# Patient Record
Sex: Male | Born: 1945
Health system: Southern US, Community
[De-identification: ages and names within clinical notes are randomized; demographics above are authoritative.]

## PROBLEM LIST (undated history)

## (undated) DIAGNOSIS — Z973 Presence of spectacles and contact lenses: Secondary | ICD-10-CM

## (undated) DIAGNOSIS — R569 Unspecified convulsions: Secondary | ICD-10-CM

## (undated) DIAGNOSIS — N138 Other obstructive and reflux uropathy: Secondary | ICD-10-CM

## (undated) DIAGNOSIS — N401 Enlarged prostate with lower urinary tract symptoms: Secondary | ICD-10-CM

## (undated) DIAGNOSIS — Z86010 Personal history of colonic polyps: Secondary | ICD-10-CM

## (undated) DIAGNOSIS — D494 Neoplasm of unspecified behavior of bladder: Secondary | ICD-10-CM

## (undated) DIAGNOSIS — K5730 Diverticulosis of large intestine without perforation or abscess without bleeding: Secondary | ICD-10-CM

## (undated) DIAGNOSIS — T4145XA Adverse effect of unspecified anesthetic, initial encounter: Secondary | ICD-10-CM

## (undated) DIAGNOSIS — F419 Anxiety disorder, unspecified: Secondary | ICD-10-CM

## (undated) DIAGNOSIS — R001 Bradycardia, unspecified: Secondary | ICD-10-CM

## (undated) DIAGNOSIS — Z860101 Personal history of adenomatous and serrated colon polyps: Secondary | ICD-10-CM

## (undated) DIAGNOSIS — Z87820 Personal history of traumatic brain injury: Secondary | ICD-10-CM

## (undated) DIAGNOSIS — Z8659 Personal history of other mental and behavioral disorders: Secondary | ICD-10-CM

## (undated) DIAGNOSIS — R350 Frequency of micturition: Secondary | ICD-10-CM

## (undated) DIAGNOSIS — R351 Nocturia: Secondary | ICD-10-CM

## (undated) DIAGNOSIS — G40109 Localization-related (focal) (partial) symptomatic epilepsy and epileptic syndromes with simple partial seizures, not intractable, without status epilepticus: Secondary | ICD-10-CM

## (undated) DIAGNOSIS — N3941 Urge incontinence: Secondary | ICD-10-CM

## (undated) DIAGNOSIS — N1370 Vesicoureteral-reflux, unspecified: Secondary | ICD-10-CM

## (undated) DIAGNOSIS — C679 Malignant neoplasm of bladder, unspecified: Secondary | ICD-10-CM

## (undated) DIAGNOSIS — F3160 Bipolar disorder, current episode mixed, unspecified: Secondary | ICD-10-CM

## (undated) DIAGNOSIS — Z95828 Presence of other vascular implants and grafts: Secondary | ICD-10-CM

## (undated) DIAGNOSIS — C801 Malignant (primary) neoplasm, unspecified: Secondary | ICD-10-CM

## (undated) DIAGNOSIS — Z9689 Presence of other specified functional implants: Secondary | ICD-10-CM

## (undated) HISTORY — DX: Unspecified convulsions: R56.9

## (undated) HISTORY — PX: COLONOSCOPY: SHX174

## (undated) HISTORY — PX: TRANSURETHRAL RESECTION OF BLADDER: SUR1395

## (undated) HISTORY — PX: POLYPECTOMY: SHX149

---
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
## 1999-11-07 ENCOUNTER — Other Ambulatory Visit: Admission: RE | Admit: 1999-11-07 | Discharge: 1999-11-07 | Payer: Self-pay | Admitting: Urology

## 2000-01-25 ENCOUNTER — Inpatient Hospital Stay (HOSPITAL_COMMUNITY): Admission: EM | Admit: 2000-01-25 | Discharge: 2000-01-26 | Payer: Self-pay

## 2000-01-25 ENCOUNTER — Encounter: Payer: Self-pay | Admitting: Surgery

## 2004-05-11 ENCOUNTER — Emergency Department (HOSPITAL_COMMUNITY): Admission: EM | Admit: 2004-05-11 | Discharge: 2004-05-11 | Payer: Self-pay | Admitting: Emergency Medicine

## 2004-05-13 ENCOUNTER — Emergency Department (HOSPITAL_COMMUNITY): Admission: EM | Admit: 2004-05-13 | Discharge: 2004-05-14 | Payer: Self-pay | Admitting: Emergency Medicine

## 2004-05-15 ENCOUNTER — Ambulatory Visit: Payer: Self-pay | Admitting: Psychiatry

## 2004-05-15 ENCOUNTER — Emergency Department (HOSPITAL_COMMUNITY): Admission: EM | Admit: 2004-05-15 | Discharge: 2004-05-15 | Payer: Self-pay | Admitting: Emergency Medicine

## 2004-05-15 ENCOUNTER — Inpatient Hospital Stay (HOSPITAL_COMMUNITY): Admission: RE | Admit: 2004-05-15 | Discharge: 2004-05-20 | Payer: Self-pay | Admitting: Psychiatry

## 2004-05-17 ENCOUNTER — Ambulatory Visit (HOSPITAL_COMMUNITY): Admission: RE | Admit: 2004-05-17 | Discharge: 2004-05-17 | Payer: Self-pay | Admitting: Psychiatry

## 2004-05-21 ENCOUNTER — Ambulatory Visit (HOSPITAL_COMMUNITY): Admission: RE | Admit: 2004-05-21 | Discharge: 2004-05-21 | Payer: Self-pay | Admitting: Psychiatry

## 2008-06-13 ENCOUNTER — Emergency Department (HOSPITAL_COMMUNITY): Admission: EM | Admit: 2008-06-13 | Discharge: 2008-06-13 | Payer: Self-pay | Admitting: Emergency Medicine

## 2008-08-09 ENCOUNTER — Ambulatory Visit: Payer: Self-pay | Admitting: Psychology

## 2008-10-17 ENCOUNTER — Encounter (INDEPENDENT_AMBULATORY_CARE_PROVIDER_SITE_OTHER): Payer: Self-pay | Admitting: *Deleted

## 2009-06-01 ENCOUNTER — Telehealth: Payer: Self-pay | Admitting: Internal Medicine

## 2009-07-17 ENCOUNTER — Encounter (INDEPENDENT_AMBULATORY_CARE_PROVIDER_SITE_OTHER): Payer: Self-pay | Admitting: *Deleted

## 2009-08-28 ENCOUNTER — Ambulatory Visit: Payer: Self-pay | Admitting: Internal Medicine

## 2009-08-28 ENCOUNTER — Encounter (INDEPENDENT_AMBULATORY_CARE_PROVIDER_SITE_OTHER): Payer: Self-pay | Admitting: *Deleted

## 2009-09-11 ENCOUNTER — Ambulatory Visit: Payer: Self-pay | Admitting: Internal Medicine

## 2009-09-11 HISTORY — PX: COLONOSCOPY W/ POLYPECTOMY: SHX1380

## 2009-09-14 ENCOUNTER — Encounter: Payer: Self-pay | Admitting: Internal Medicine

## 2010-05-28 NOTE — Letter (Signed)
Summary: Previsit letter  [facility]  [address] [place], [place] [address]   Phone: [phone]  Fax: [phone]       07/17/2009 MRN: [id number]  [facility] [address] [place], [place]  [address]  [place]  Dear Mr. MOGAN,  Welcome to the Gastroenterology Division at [facility].    You are scheduled to see a nurse for your pre-procedure visit on Aug 28, 2009 at 8:30am on the 3rd floor at [facility], [address]  We ask that you try to arrive at our office 15 minutes prior to your appointment time to allow for check-in.  Your nurse visit will consist of discussing your medical and surgical history, your immediate family medical history, and your medications.    Please bring a complete list of all your medications or, if you prefer, bring the medication bottles and we will list them.  We will need to be aware of both prescribed and over the counter drugs.  We will need to know exact dosage information as well.  If you are on blood thinners (Coumadin, Plavix, Aggrenox, Ticlid, etc.) please call our office today/prior to your appointment, as we need to consult with your physician about holding your medication.   Please be prepared to read and sign documents such as consent forms, a financial agreement, and acknowledgement forms.  If necessary, and with your consent, a friend or relative is welcome to sit-in on the nurse visit with you.  Please bring your insurance card so that we may make a copy of it.  If your insurance requires a referral to see a specialist, please bring your referral form from your primary care physician.  No co-pay is required for this nurse visit.     If you cannot keep your appointment, please call [phone] to cancel or reschedule prior to your appointment date.  This allows [place] the opportunity to schedule an appointment for another patient in need of care.    Thank you for choosing [facility] for your medical  needs.  We appreciate the opportunity to care for you.  Please visit [place] at our website  to learn more about our practice.                     Sincerely.                                                                                                                   The Gastroenterology Division

## 2010-05-28 NOTE — Procedures (Signed)
Summary: Colonoscopy  Patient: John Holt Note: All result statuses are Final unless otherwise noted.  Tests: (1) Colonoscopy (COL)   COL Colonoscopy           DONE     [facility]     [address]     [place], [place]  [address]           COLONOSCOPY PROCEDURE REPORT           PATIENT:  [name], [name]  MR#:  [id number]     BIRTHDATE:  19-Sep-1945, 64 yrs. old  GENDER:  male     ENDOSCOPIST:  Wilhemina Bonito. Eda Keys, MD     REF. BY:  Surveillance Program Recall,     PROCEDURE DATE:  09/11/2009     PROCEDURE:  Colonoscopy with snare polypectomy x 3     ASA CLASS:  Class II     INDICATIONS:  history of pre-cancerous (adenomatous) colon polyps     ; index exam 10-2003 w/ small adenomas     MEDICATIONS:   Fentanyl 100 mcg IV, Versed 8 mg IV           DESCRIPTION OF PROCEDURE:   After the risks benefits and     alternatives of the procedure were thoroughly explained, informed     consent was obtained.  Digital rectal exam was performed and     revealed no abnormalities.   The LB CF-[id number] endoscope     was introduced through the anus and advanced to the cecum, which     was identified by both the appendix and ileocecal valve, without     limitations.Time to cecum = 3:10 min. The quality of the prep was     excellent, using MoviPrep.  The instrument was then slowly     withdrawn (time = 14:57 min) as the colon was fully examined.     <<PROCEDUREIMAGES>>           FINDINGS:  Three polyps were found ascending colon (5mm),     descending colon (1mm) and sigmoid colon (3mm). Polyps were snared     without cautery. Retrieval was successful.   Moderate     diverticulosis was found in the sigmoid colon.   Retroflexed views     in the rectum revealed no abnormalities.    The scope was then     withdrawn from the patient and the procedure completed.           COMPLICATIONS:  None     ENDOSCOPIC IMPRESSION:     1) Three polyps - removed     2) Moderate diverticulosis in the  sigmoid colon           RECOMMENDATIONS:     1) Follow up colonoscopy in 5 years           ______________________________     Wilhemina Bonito. Eda Keys, MD           CC:  Chilton Greathouse, MD; The Patient           n.     eSIGNED:   Wilhemina Bonito. Eda Keys at 09/11/2009 08:48 AM           Lynita Lombard, [id number]  Note: An exclamation mark (!) indicates a result that was not dispersed into the flowsheet. Document Creation Date: 09/13/2009 5:07 PM _______________________________________________________________________  (1) Order result status: Final Collection or observation date-time: 09/11/2009 08:42 Requested date-time:  Receipt date-time:  Reported date-time:  Referring Physician:   Ordering Physician: Fransico Setters [phone]) Specimen Source:  Source: Launa Grill Order Number: [id number] Lab site:   Appended Document: Colonoscopy recall     Procedures Next Due Date:    Colonoscopy: 08/2014

## 2010-05-28 NOTE — Miscellaneous (Signed)
Summary: [facility] Previsit/prep  Clinical Lists Changes  Medications: Added new medication of MOVIPREP 100 GM  SOLR (PEG-KCL-NACL-NASULF-NA ASC-C) As per prep instructions. - Signed Rx of MOVIPREP 100 GM  SOLR (PEG-KCL-NACL-NASULF-NA ASC-C) As per prep instructions.;  #1 x 0;  Signed;  Entered by: Wyona Almas RN;  Authorized by: Hilarie Fredrickson MD;  Method used: Electronically to [facility]  [address] [id number]*, [address] [place], [place]  [address], Ph: [phone] or [phone], Fax: [phone] Observations: Added new observation of NKA: T (08/28/2009 8:27)    Prescriptions: MOVIPREP 100 GM  SOLR (PEG-KCL-NACL-NASULF-NA ASC-C) As per prep instructions.  #1 x 0   Entered by:   Wyona Almas RN   Authorized by:   Hilarie Fredrickson MD   Signed by:   Wyona Almas RN on 08/28/2009   Method used:   [facility]  [address] [phone]* (retail)       [address]       [place], [place]  [address]       Ph: [phone] or [phone]       Fax: [phone]   RxID:   [id number]

## 2010-05-28 NOTE — Letter (Signed)
Summary: [facility] Instructions  [facility]  [address] [place], [place] [address]   Phone: [phone]  Fax: [phone]       John Holt    1945/10/02    MRN: [id number]        Procedure Day Dorna Bloom:  Jake Shark  09/11/09     Arrival Time:  7:30AM     Procedure Time:  8:00AM     Location of Procedure:                    Juliann Pares  [facility] (4th Floor)                        PREPARATION FOR COLONOSCOPY WITH MOVIPREP   Starting 5 days prior to your procedure 09/06/09 do not eat nuts, seeds, popcorn, corn, beans, peas,  salads, or any raw vegetables.  Do not take any fiber supplements (e.g. Metamucil, Citrucel, and Benefiber).  THE DAY BEFORE YOUR PROCEDURE         DATE: 09/10/09  DAY: MONDAY  1.  Drink clear liquids the entire day-NO SOLID FOOD  2.  Do not drink anything colored red or purple.  Avoid juices with pulp.  No orange juice.  3.  Drink at least 64 oz. (8 glasses) of fluid/clear liquids during the day to prevent dehydration and help the prep work efficiently.  CLEAR LIQUIDS INCLUDE: Water Jello Ice Popsicles Tea (sugar ok, no milk/cream) Powdered fruit flavored drinks Coffee (sugar ok, no milk/cream) Gatorade Juice: apple, white grape, white cranberry  Lemonade Clear bullion, consomm, broth Carbonated beverages (any kind) Strained chicken noodle soup Hard Candy                             4.  In the morning, mix first dose of MoviPrep solution:    Empty 1 Pouch A and 1 Pouch B into the disposable container    Add lukewarm drinking water to the top line of the container. Mix to dissolve    Refrigerate (mixed solution should be used within 24 hrs)  5.  Begin drinking the prep at 5:00 p.m. The MoviPrep container is divided by 4 marks.   Every 15 minutes drink the solution down to the next mark (approximately 8 oz) until the full liter is complete.   6.  Follow completed prep with 16 oz of clear liquid of your choice (Nothing  red or purple).  Continue to drink clear liquids until bedtime.  7.  Before going to bed, mix second dose of MoviPrep solution:    Empty 1 Pouch A and 1 Pouch B into the disposable container    Add lukewarm drinking water to the top line of the container. Mix to dissolve    Refrigerate  THE DAY OF YOUR PROCEDURE      DATE: 09/11/09  DAY: TUESDAY  Beginning at 3:00AM (5 hours before procedure):         1. Every 15 minutes, drink the solution down to the next mark (approx 8 oz) until the full liter is complete.  2. Follow completed prep with 16 oz. of clear liquid of your choice.    3. You may drink clear liquids until 6:00AM (2 HOURS BEFORE PROCEDURE).   MEDICATION INSTRUCTIONS  Unless otherwise instructed, you should take regular prescription medications with a small sip of water   as early as possible the morning  of your procedure.          OTHER INSTRUCTIONS  You will need a responsible adult at least 67 years of age to accompany you and drive you home.   This person must remain in the waiting room during your procedure.  Wear loose fitting clothing that is easily removed.  Leave jewelry and other valuables at home.  However, you may wish to bring a book to read or  an iPod/MP3 player to listen to music as you wait for your procedure to start.  Remove all body piercing jewelry and leave at home.  Total time from sign-in until discharge is approximately 2-3 hours.  You should go home directly after your procedure and rest.  You can resume normal activities the  day after your procedure.  The day of your procedure you should not:   Drive   Make legal decisions   Operate machinery   Drink alcohol   Return to work  You will receive specific instructions about eating, activities and medications before you leave.    The above instructions have been reviewed and explained to me by   Wyona Almas RN  Aug 28, 2009 8:54 AM     I fully understand and can  verbalize these instructions _____________________________ Date _________

## 2010-05-28 NOTE — Progress Notes (Signed)
Summary: Schedule Colonoscopy  Phone Note Outgoing Call   Call placed by: Hortense Ramal CMA Duncan Dull),  June 01, 2009 3:32 PM Call placed to: Patient Summary of Call: Patient needs a recall colonoscopy due to his history of adenomatous colonic polyps and diverticulosis. I have left a message for the patient to call back. Hortense Ramal CMA Duncan Dull)  June 01, 2009 3:35 PM   Follow-up for Phone Call        I have left a message for patient to call back. Hortense Ramal CMA Duncan Dull)  June 07, 2009 3:43 PM   Additional Follow-up for Phone Call Additional follow up Details #1::        Left message on patients machine to call back.  Additional Follow-up by: Harlow Mares CMA Duncan Dull),  June 08, 2009 3:06 PM     Appended Document: Schedule Colonoscopy We have not gotten a call back from the patient. We will send a letter.

## 2010-05-28 NOTE — Letter (Signed)
Summary: Patient Notice- Polyp Results  [facility]  [address] [place], [place] [address]   Phone: [phone]  Fax: [phone]        Sep 14, 2009 MRN: [id number]    CANDON CARAS [address] [place], [place]  [address]    Dear Mr. PRATS,  I am pleased to inform you that the colon polyp(s) removed during your recent colonoscopy was (were) found to be benign (no cancer detected) upon pathologic examination.  I recommend you have a repeat colonoscopy examination in 5 years to look for recurrent polyps, as having colon polyps increases your risk for having recurrent polyps or even colon cancer in the future.  Should you develop new or worsening symptoms of abdominal pain, bowel habit changes or bleeding from the rectum or bowels, please schedule an evaluation with either your primary care physician or with me.  Additional information/recommendations:  __ No further action with gastroenterology is needed at this time. Please      follow-up with your primary care physician for your other healthcare      needs.   Please call us if you are having persistent problems or have questions about your condition that have not been fully answered at this time.  Sincerely,  Hilarie Fredrickson MD  This letter has been electronically signed by your physician.  Appended Document: Patient Notice- Polyp Results letter mailed

## 2010-06-11 ENCOUNTER — Emergency Department (HOSPITAL_COMMUNITY)
Admission: EM | Admit: 2010-06-11 | Discharge: 2010-06-11 | Disposition: A | Discharge status: Home / Self Care | Payer: BC Managed Care – PPO | Attending: Emergency Medicine | Admitting: Emergency Medicine

## 2010-06-11 ENCOUNTER — Emergency Department (HOSPITAL_COMMUNITY): Payer: Self-pay

## 2010-06-11 DIAGNOSIS — R5381 Other malaise: Secondary | ICD-10-CM | POA: Insufficient documentation

## 2010-06-11 DIAGNOSIS — F29 Unspecified psychosis not due to a substance or known physiological condition: Secondary | ICD-10-CM | POA: Insufficient documentation

## 2010-06-11 DIAGNOSIS — G40909 Epilepsy, unspecified, not intractable, without status epilepticus: Secondary | ICD-10-CM | POA: Insufficient documentation

## 2010-06-11 DIAGNOSIS — R5383 Other fatigue: Secondary | ICD-10-CM | POA: Insufficient documentation

## 2010-06-11 LAB — URINALYSIS, ROUTINE W REFLEX MICROSCOPIC
Bilirubin Urine: NEGATIVE
Leukocytes, UA: NEGATIVE
Nitrite: NEGATIVE
Specific Gravity, Urine: 1.014 (ref 1.005–1.030)
Urobilinogen, UA: 0.2 mg/dL (ref 0.0–1.0)

## 2010-06-11 LAB — URINE MICROSCOPIC-ADD ON

## 2010-06-11 LAB — DIFFERENTIAL
Basophils Absolute: 0 10*3/uL (ref 0.0–0.1)
Basophils Relative: 0 % (ref 0–1)
Eosinophils Relative: 3 % (ref 0–5)
Monocytes Absolute: 0.4 10*3/uL (ref 0.1–1.0)

## 2010-06-11 LAB — CBC
MCHC: 33.3 g/dL (ref 30.0–36.0)
Platelets: 195 10*3/uL (ref 150–400)
RDW: 14.2 % (ref 11.5–15.5)
WBC: 4.5 10*3/uL (ref 4.0–10.5)

## 2010-06-11 LAB — BASIC METABOLIC PANEL
Calcium: 9.3 mg/dL (ref 8.4–10.5)
GFR calc Af Amer: >60 mL/min (ref >60)
GFR calc non Af Amer: >60 mL/min (ref >60)
Glucose, Bld: 138 mg/dL — ABNORMAL HIGH (ref 70–99)
Potassium: 3.8 mEq/L (ref 3.5–5.1)
Sodium: 144 mEq/L (ref 135–145)

## 2010-06-11 LAB — GLUCOSE, CAPILLARY

## 2010-06-25 ENCOUNTER — Other Ambulatory Visit: Payer: Self-pay | Admitting: Internal Medicine

## 2010-06-25 DIAGNOSIS — R05 Cough: Secondary | ICD-10-CM

## 2010-07-02 ENCOUNTER — Other Ambulatory Visit: Payer: BC Managed Care – PPO

## 2010-07-05 ENCOUNTER — Ambulatory Visit
Admission: RE | Admit: 2010-07-05 | Discharge: 2010-07-05 | Disposition: A | Discharge status: Home / Self Care | Payer: BC Managed Care – PPO | Source: Ambulatory Visit | Attending: Internal Medicine | Admitting: Internal Medicine

## 2010-07-05 DIAGNOSIS — R05 Cough: Secondary | ICD-10-CM

## 2010-08-13 LAB — DIFFERENTIAL
Basophils Relative: 0 % (ref 0–1)
Lymphocytes Relative: 8 % — ABNORMAL LOW (ref 12–46)
Monocytes Absolute: 0.3 10*3/uL (ref 0.1–1.0)
Monocytes Relative: 6 % (ref 3–12)
Neutro Abs: 5.1 10*3/uL (ref 1.7–7.7)

## 2010-08-13 LAB — CBC
HCT: 42.5 % (ref 39.0–52.0)
Hemoglobin: 14.1 g/dL (ref 13.0–17.0)
MCHC: 33.1 g/dL (ref 30.0–36.0)
RBC: 4.73 MIL/uL (ref 4.22–5.81)

## 2010-08-13 LAB — POCT I-STAT, CHEM 8
BUN: 21 mg/dL (ref 6–23)
Calcium, Ion: 1.05 mmol/L — ABNORMAL LOW (ref 1.12–1.32)
Chloride: 106 mEq/L (ref 96–112)
Glucose, Bld: 119 mg/dL — ABNORMAL HIGH (ref 70–99)

## 2010-09-13 NOTE — Discharge Summary (Signed)
NAMEGLENDAL, John Holt NO.:  [id number]   MEDICAL RECORD NO.:  [id number]          PATIENT TYPE:  IPS   LOCATION:  0403                          FACILITY:  [facility]   PHYSICIAN:  Jeanice Lim, M.D. DATE OF BIRTH:  [birth year]   DATE OF ADMISSION:  05/15/2004  DATE OF DISCHARGE:  05/20/2004                                 DISCHARGE SUMMARY   IDENTIFYING DATA:  This is a 65 year old separated Caucasian male  voluntarily admitted with no prior psychiatric history, presenting to the  emergency room several days in a row with strange behavior, complaining of  having seizures and energy with tingling in hands and feet.  Asked for a gun  to shoot himself.  Endorsed feelings of arguing with God, overwhelmed,  feelings of death and doom, labile, not making sense to friends, confused  and agitated.  First inpatient treatment.  First psychiatric treatment.  No  prior history of mood swings or suicidal ideation.   MEDICATIONS:  None.   ALLERGIES:  No known drug allergies.   PHYSICAL EXAMINATION:  Physical exam and neurologic exam essentially within  normal limits.   LABORATORY DATA:  Routine admission labs essentially within normal limits.  CT of brain negative.   MENTAL STATUS EXAM:  Labile.  Affect tearful, then smiling with  inappropriate affect.  Difficulty controlling tearfulness, tangential,  pressured, grandiose, hyperreligious with flight of ideas, somewhat  delusional but developing some reality testing as reported.  Cognitively  intact.  Judgment and insight were impaired.  The patient had grandiose  delusions, believing that he has special talents and powers to control the  weather.   ADMISSION DIAGNOSES:   AXIS I:  Bipolar disorder, type 1, mixed state with psychotic features.   AXIS II:  None.   AXIS III:  None.   AXIS IV:  Severe (stress related to limited support system and other  psychosocial issues).   AXIS V:  20/70.   HOSPITAL COURSE:  The  patient was admitted and ordered routine p.r.n.  medications and underwent further monitoring.  Was encouraged to participate  in individual, group and milieu therapy.  Was monitored by nurse  practitioner and primary care physician contacted.  Lithium was started due  to clear mood instability along with Geodon and Ativan.  Blood pressure  medicines and other medical medications started and neurology consult  obtained.  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  Reported doing well most of the month and  then having brief periods where he would have mood instability and then feel  fatigue and strong feelings of emotions come over him, which he experienced  as religious experiences and positive things despite episodic suicidal  thoughts.  The patient admitted to multiple stressors including  relationships, in the process of divorcing his wife of many years,  emotionally overwhelmed.  The patient reported a tolerance to medications  with no side effects, was sleeping well, reported feeling very well,  able to  read, concentrate.  Thought process became more organized and he was more  reality-based.  Family meeting was held with daughter who came in town and  others reported marked improvement with a positive response to clinical  interventions.  The patient was discharged with improved mood stability.  No  dangerous ideation.  No delusional thinking.  No psychotic symptoms.  Given  medication education.   DISCHARGE MEDICATIONS:  1.  Hytrin 10 mg twice a day as directed by family doctor.  2.  Risperdal 0.5 mg, 1/2 q.a.m. and 2 q.h.s.  3.  Lithium 300 mg, 1 b.i.d.   FOLLOW UP:  The patient is to follow up with Dr. Thad Ranger at [facility]  Neurologic, rule out possible seizure disorder and for psychiatric follow-up  for medication management and for therapy.  Recommended  for cognitive  behavioral with Coralee North and follow-up with Dr. Senaida Ores on January  30th at 3 p.m.   DISCHARGE DIAGNOSES:   AXIS I:  Bipolar disorder, type 1, mixed state with psychotic features.   AXIS II:  None.   AXIS III:  None.   AXIS IV:  Severe (stress related to limited support system and other  psychosocial issues).   AXIS V:  Global Assessment of Functioning on discharge 60.      JEM/MEDQ  D:  06/21/2004  T:  06/21/2004  Job:  [id number]

## 2010-09-13 NOTE — Procedures (Signed)
DESCRIPTION:  A posterior dominant background was seen at 11 Hz and emits  bilaterally synchronously and symmetrically from the posterior hemispheres  promptly attenuating with eye opening.  Normal sinus rhythm is seen on the  EKG electrode.  Photic stimulation did lead to photic entrainment at  frequencies from 9-15 Hz, no epileptiform discharges are seen.  Hyperventilation did not lead to significant amplitude buildup.  There was  no excessive motion artifact seen.   CONCLUSION:  This is a normal EEG for the patient's age and conscious state.      JY:NWGN  D:  05/21/2004 21:06:13  T:  05/21/2004 23:00:04  Job #:  [id number]   cc:   Jeanice Lim, M.D.

## 2010-09-13 NOTE — Consult Note (Signed)
John Holt, John Holt                ACCOUNT NO.:  [id number]   MEDICAL RECORD NO.:  [id number]         PATIENT TYPE:  BIPS   LOCATION:                                FACILITY:  [facility]   PHYSICIAN:  Michael L. Reynolds, M.D.DATE OF BIRTH:  1945-10-28   DATE OF CONSULTATION:  05/17/2004  DATE OF DISCHARGE:                                   CONSULTATION   REFERRING PHYSICIAN:  Dr. Aleatha Borer.   CHIEF COMPLAINT:  Possible seizures.   HISTORY OF PRESENT ILLNESS:  This is the initial inpatient consultation  evaluation of this 65 year old man with little past medical history.  The  patient reports that for about the last 15 to 20 years he has had  intermittent spells in which he will, for a moment or two, have a sense of  dissociation from myself along with a slight sense of dizziness.  This  lasts a couple of minutes, will not impair his consciousness and then will  resolve.  He will typically have 4 or 5 of these over the course of a couple  of days and then might go for several weeks without any further spells.  He  notes that following these periods when he will have these spells, he will  have a day or two in which he says he feels particularly fragile  emotionally, finding self easily upset and crying at circumstances which  normally would not have such an intense impact on him.  He states that he  has 2 episodes in his life in which this sense of dissociation was followed  by a brief period of loss of consciousness.  They both occurred while he was  playing racquetball; one early last year and the other a week ago.  He does  not believe that he was unconscious for very long.  On both occasions, EMS  was called and he declined acute transport to the emergency room.  He does  not remember anything unusual happening after the first of these episodes.  However, within a few days of the second episode, he began experiencing an  extreme sense of the mood instability he had undergone  before, to the point  that he had a severe depression with a suicidal ideation, for which he was  seen in the [facility] Emergency Room and ultimately discharged, followed  very soon by an intense euphoria and some hallucinosis, for which he was  seen in the [facility] ER and ultimately admitted to [facility].  While he has been here he has been able to identify his behaviors as unusual  for him and seems to be aware of his surroundings and able to tell a story  in a coherent and comprehensive manner.  He denies any previous history of  having episodes of this nature.  Neurologic consultation is requested for  consideration of possible seizures.   PAST MEDICAL HISTORY:  He says that he had a head injury about 2 1/2 years  ago in which he fell off a ladder.  He says that subsequently a colleague  felt that he was  not quite performing mentally as well as he had been prior  to that, but he did not have any other obvious difficulties.  He does have  benign prostatic hypertrophy, for which he takes medications.  Beyond that,  he denies any chronic medical problems.   FAMILY HISTORY:  He denies any family history of seizures specifically.   SOCIAL HISTORY:  He is separated.  He denies any history of alcohol or  illicit drug use.  He does consume a glass of wine with dinner regularly.  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   MEDICATIONS:  Prior to admission he was taking only Hytrin.  Here he is also  receiving lithium, Risperdal and p.r.n. Zyprexa and Ambien.   PHYSICAL EXAMINATION:  VITAL SIGNS:  Temperature 96.6; respirations 18;  blood pressure and pulse are not presently available.  GENERAL:  This is a healthy appearing man seated in no evident distress.  HEAD:  Cranium is normocephalic and atraumatic.  ENT:  Oropharynx is benign.  NECK:  Supple without carotid bruits.  HEART:  Regular rate and rhythm without  murmurs.  NEUROLOGIC:  Mental status - He is awake and alert.  He is fully oriented to  time, place, person and situation.  Recent memory is adequate.  Attention  span, concentration and fund of knowledge are all appropriate.  Speech is  fluent and not dysarthric.  There are no defects to confrontational naming  and he can repeat a phrase.  Mood was euthymic and affect appropriate.  His  speech might be a little bit pressured.  Cranial nerves - Fundi are poorly  visualized.  Pupils are equal and reactive.  Extraocular movements full  without nystagmus.  Visual fields full to confrontation.  Hearing is intact  and symmetric to finger rub.  Facial sensation is intact to pinprick.  Face,  tongue and palate move normally and symmetrically.  Shoulder shrug strength  is normal.  Motor testing - Normal bulk and tone.  Normal strength in all  tested extremity muscles.  Sensation intact to light touch, pinprick and  double simultaneous stimulation in all extremities.  Coordination - Rapid  movements are performed well.  Finger-to-nose and heel-to-shin are performed  well.  Gait - He arises from a chair easily and his stance is normal.  He is  able to heel-toe and tandem walk without difficulty.  Reflexes 2+ and  symmetric.  Toes are downgoing.   LABORATORY REVIEW:  CBC from May 13, 2004 is unremarkable.  CMET from  yesterday is unremarkable.  TSH from yesterday is normal.  He a normal drug  screen on both of his visits to the ER on January 16 and January 18.  Cardiac enzymes are negative.  CT of the head performed in the ER on May 13, 2004 reported as negative.   IMPRESSION:  Suspect complex partial seizures with a recent event of  increased intensity followed by a postdrome of mood instability.  The  history that he gives actually fits this pretty well.   RECOMMENDATIONS:  1.  MRI of the brain with and without contrast.  This is to be done tonight.  2.  EEG. 3.  Once the above are  done, we will suggest a trial of an anticonvulsant.      Possibilities would include Trileptal tapering up to 300 mg b.i.d.,      Topamax tapering up to 100 mg  b.i.d. or Lamictal tapering up to 100 mg      b.i.d.  I would suggest this to eventually replace the lithium and      possibly the Risperdal as well, depending on how he does.  He can follow-      up with me and let me know how he is doing on his medications.   Thank you for the consultation.      MLR/MEDQ  D:  05/17/2004  T:  05/18/2004  Job:  [id number]

## 2011-01-16 ENCOUNTER — Emergency Department (HOSPITAL_COMMUNITY)
Admission: EM | Admit: 2011-01-16 | Discharge: 2011-01-16 | Discharge status: Against Medical Advice | Payer: BC Managed Care – PPO | Attending: Emergency Medicine | Admitting: Emergency Medicine

## 2011-01-16 DIAGNOSIS — R002 Palpitations: Secondary | ICD-10-CM | POA: Insufficient documentation

## 2011-07-26 ENCOUNTER — Encounter (HOSPITAL_COMMUNITY): Payer: Self-pay | Admitting: *Deleted

## 2011-07-26 ENCOUNTER — Emergency Department (HOSPITAL_COMMUNITY)
Admission: EM | Admit: 2011-07-26 | Discharge: 2011-07-26 | Disposition: A | Discharge status: Home / Self Care | Payer: BC Managed Care – PPO | Attending: Emergency Medicine | Admitting: Emergency Medicine

## 2011-07-26 DIAGNOSIS — R4182 Altered mental status, unspecified: Secondary | ICD-10-CM | POA: Insufficient documentation

## 2011-07-26 DIAGNOSIS — Z0000 Encounter for general adult medical examination without abnormal findings: Secondary | ICD-10-CM

## 2011-07-26 DIAGNOSIS — Z79899 Other long term (current) drug therapy: Secondary | ICD-10-CM | POA: Insufficient documentation

## 2011-07-26 DIAGNOSIS — R631 Polydipsia: Secondary | ICD-10-CM | POA: Insufficient documentation

## 2011-07-26 DIAGNOSIS — G40909 Epilepsy, unspecified, not intractable, without status epilepticus: Secondary | ICD-10-CM | POA: Insufficient documentation

## 2011-07-26 LAB — POCT I-STAT, CHEM 8
BUN: 11 mg/dL (ref 6–23)
Calcium, Ion: 1.22 mmol/L (ref 1.12–1.32)
Creatinine, Ser: 1.1 mg/dL (ref 0.50–1.35)
TCO2: 26 mmol/L (ref 0–100)

## 2011-07-26 NOTE — ED Notes (Signed)
Wife reports pt reported "he faked it" and "didn't take the Risperdal". Pt reports he was having psychotic episode but is not now. Pt and wife deny SI and Hi. Pt is calm and cooperative. Sitting quietly in hall bed, wife at bedside.

## 2011-07-26 NOTE — ED Notes (Signed)
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 Pt currently seeing Dr. Melbourne Abts;. wife reports speaking to the Neurologist on call prior to bringing pt to ED and was instructed to give pt a Risperdal and try to get pt to rest but was unsuccessful. Pt reported to have had same episode in 2006.

## 2011-07-26 NOTE — Discharge Instructions (Signed)
YOU CAN BE DISCHARGED HOME TO FOLLOW UP WITH DR. Sandria Manly NEXT WEEK FOR RECHECK. RETURN HERE AS NEEDED FOR ANY NEW CONCERN OR RECURRENT ABNORMAL BEHAVIOR.

## 2011-07-26 NOTE — ED Notes (Addendum)
Upon asking pt why he came here today, he reports that he was having a psychotic episode because he thought his wife was having a psychotic break and became panicked and called their friends for help. Pt denies SI/HI, visual and auditory hallucinations at present.

## 2011-07-26 NOTE — ED Provider Notes (Signed)
History     CSN: [id number]  Arrival date & time 07/26/11  1550   First MD Initiated Contact with Patient 07/26/11 1702      Chief Complaint  Patient presents with  . Altered Mental Status  . Polydipsia    (Consider location/radiation/quality/duration/timing/severity/associated sxs/prior treatment) Patient is a 66 y.o. male presenting with altered mental status. The history is provided by the patient and the spouse.  Altered Mental Status This is a new problem. The current episode started today. The problem occurs rarely. The problem has been resolved. Pertinent negatives include no chills or fever. Associated symptoms comments: The patient reports having a period earlier today where he believed his wife was becoming psychotic and called neighbors as well as 911 with police response. Per his wife, patient's behavior changed earlier today while they were arguing and he started behaving strangely. She states that symptoms have resolved at this time. No aggressive or violent behavior. The patient never endorsed suicidal thoughts. She states this occurred once in the distant past and was diagnosed as a "psuedo psychosis" and was told it was related to his epileptic disorder.. The symptoms are aggravated by nothing. He has tried nothing for the symptoms.    Past Medical History  Diagnosis Date  . Epilepsy     History reviewed. No pertinent past surgical history.  History reviewed. No pertinent family history.  History  Substance Use Topics  . Smoking status: Never Smoker   . Smokeless tobacco: Never Used  . Alcohol Use: 1.2 oz/week    2 Glasses of wine per week     wine daily      Review of Systems  Constitutional: Negative for fever and chills.  HENT: Negative.   Respiratory: Negative.   Cardiovascular: Negative.   Gastrointestinal: Negative.   Musculoskeletal: Negative.   Skin: Negative.   Neurological: Negative.   Psychiatric/Behavioral: Positive for altered mental  status.       See HPI.    Allergies  Review of patient's allergies indicates no known allergies.  Home Medications   Current Outpatient Rx  Name Route Sig Dispense Refill  . LAMOTRIGINE 150 MG PO TABS Oral Take 150 mg by mouth 2 (two) times daily.    [name] POTASSIUM CHLORIDE CRYS ER 20 MEQ PO TBCR Oral Take 20 mEq by mouth daily.    [name] TERAZOSIN HCL 2 MG PO CAPS Oral Take 2 mg by mouth 2 (two) times daily.      BP 137/75  Pulse 84  Temp(Src) 97.9 F (36.6 C) (Oral)  Resp 16  Wt 150 lb (68.04 kg)  SpO2 100%  Physical Exam  Constitutional: He is oriented to person, place, and time. He appears well-developed and well-nourished.  HENT:  Head: Normocephalic.  Neck: Normal range of motion. Neck supple.  Cardiovascular: Normal rate and regular rhythm.   Pulmonary/Chest: Effort normal and breath sounds normal.  Abdominal: Soft. Bowel sounds are normal. There is no tenderness. There is no rebound and no guarding.  Musculoskeletal: Normal range of motion.  Neurological: He is alert and oriented to person, place, and time. No cranial nerve deficit. Coordination normal.  Skin: Skin is warm and dry. No rash noted.  Psychiatric: He has a normal mood and affect.       The patient is cooperative, alert, patient with evaluation process.     ED Course  Procedures (including critical care time)   Labs Reviewed  POCT I-STAT, CHEM 8   No results found. Results for orders  placed during the hospital encounter of 07/26/11  POCT I-STAT, CHEM 8      Component Value Range   Sodium 142  135 - 145 (mEq/L)   Potassium 4.3  3.5 - 5.1 (mEq/L)   Chloride 106  96 - 112 (mEq/L)   BUN 11  6 - 23 (mg/dL)   Creatinine, Ser 6.57  0.50 - 1.35 (mg/dL)   Glucose, Bld 92  70 - 99 (mg/dL)   Calcium, Ion 8.46  9.62 - 1.32 (mmol/L)   TCO2 26  0 - 100 (mmol/L)   Hemoglobin 13.9  13.0 - 17.0 (g/dL)   HCT 95.2  84.1 - 32.4 (%)  v  No diagnosis found. 1. Normal exam 2. Polydipsia    MDM  Patient states  at this point he feels back to his usual state of health. He states he understands his behavior earlier was abnormal. He continues to deny SI/HI, auditory or visual hallucinations. His wife reports the patient has had an abnormal thirst and was drinking a significant amount of water but that this has been going on for several weeks and his doctor is aware. Electrolytes are stable today. The patient is feeling back to his baseline and wife agrees and is comfortable with discharge home to follow up with Dr. Sandria Manly on Monday.         Rodena Medin, PA-C 07/26/11 1939

## 2011-07-27 NOTE — ED Provider Notes (Signed)
Medical screening examination/treatment/procedure(s) were performed by non-physician practitioner and as supervising physician I was immediately available for consultation/collaboration.  Juliet Rude. Rubin Payor, MD 07/27/11 [phone]

## 2011-08-02 ENCOUNTER — Encounter (HOSPITAL_COMMUNITY): Payer: Self-pay | Admitting: *Deleted

## 2011-08-02 ENCOUNTER — Emergency Department (HOSPITAL_COMMUNITY)
Admission: EM | Admit: 2011-08-02 | Discharge: 2011-08-03 | Disposition: A | Discharge status: Home / Self Care | Payer: BC Managed Care – PPO | Attending: Emergency Medicine | Admitting: Emergency Medicine

## 2011-08-02 DIAGNOSIS — F29 Unspecified psychosis not due to a substance or known physiological condition: Secondary | ICD-10-CM

## 2011-08-02 DIAGNOSIS — Z79899 Other long term (current) drug therapy: Secondary | ICD-10-CM | POA: Insufficient documentation

## 2011-08-02 LAB — CBC
HCT: 42.4 % (ref 39.0–52.0)
Hemoglobin: 14 g/dL (ref 13.0–17.0)
MCH: 29.2 pg (ref 26.0–34.0)
MCHC: 33 g/dL (ref 30.0–36.0)
MCV: 88.3 fL (ref 78.0–100.0)
Platelets: 266 K/uL (ref 150–400)
RBC: 4.8 MIL/uL (ref 4.22–5.81)
RDW: 15 % (ref 11.5–15.5)
WBC: 5.8 K/uL (ref 4.0–10.5)

## 2011-08-02 LAB — RAPID URINE DRUG SCREEN, HOSP PERFORMED
Amphetamines: NOT DETECTED
Barbiturates: NOT DETECTED
Benzodiazepines: NOT DETECTED
Cocaine: NOT DETECTED
Opiates: NOT DETECTED
Tetrahydrocannabinol: NOT DETECTED

## 2011-08-02 LAB — DIFFERENTIAL
Eosinophils Absolute: 0.1 10*3/uL (ref 0.0–0.7)
Eosinophils Relative: 2 % (ref 0–5)
Lymphs Abs: 1.2 10*3/uL (ref 0.7–4.0)

## 2011-08-02 LAB — POCT I-STAT, CHEM 8
Creatinine, Ser: 1 mg/dL (ref 0.50–1.35)
Glucose, Bld: 95 mg/dL (ref 70–99)
Hemoglobin: 14.3 g/dL (ref 13.0–17.0)
Sodium: 141 mEq/L (ref 135–145)
TCO2: 25 mmol/L (ref 0–100)

## 2011-08-02 LAB — ETHANOL: Alcohol, Ethyl (B): <11 mg/dL (ref 0–11)

## 2011-08-02 MED ORDER — DIVALPROEX SODIUM ER 250 MG PO TB24: 250.0000 mg | ORAL_TABLET | Freq: Every day | ORAL | Status: DC

## 2011-08-02 MED ORDER — PANTOPRAZOLE SODIUM 40 MG PO TBEC
40.0000 mg | DELAYED_RELEASE_TABLET | Freq: Every day | ORAL | Status: DC
  Administered 2011-08-02 – 2011-08-03 (×2): 40 mg via ORAL
  Filled 2011-08-02 (×2): qty 1

## 2011-08-02 MED ORDER — LAMOTRIGINE 150 MG PO TABS
150.0000 mg | ORAL_TABLET | Freq: Two times a day (BID) | ORAL | Status: DC
  Administered 2011-08-02 – 2011-08-03 (×2): 150 mg via ORAL
  Filled 2011-08-02 (×2): qty 1

## 2011-08-02 MED ORDER — OXYBUTYNIN CHLORIDE ER 15 MG PO TB24: 15.0000 mg | ORAL_TABLET | Freq: Two times a day (BID) | ORAL | Status: DC

## 2011-08-02 MED ORDER — IBUPROFEN 200 MG PO TABS: 600.0000 mg | ORAL_TABLET | Freq: Three times a day (TID) | ORAL | Status: DC | PRN

## 2011-08-02 MED ORDER — RISPERIDONE 1 MG PO TABS
1.0000 mg | ORAL_TABLET | Freq: Two times a day (BID) | ORAL | Status: DC
  Administered 2011-08-02 – 2011-08-03 (×2): 1 mg via ORAL
  Filled 2011-08-02 (×2): qty 1

## 2011-08-02 MED ORDER — TERAZOSIN HCL 2 MG PO CAPS
2.0000 mg | ORAL_CAPSULE | ORAL | Status: DC
  Filled 2011-08-02: qty 1

## 2011-08-02 MED ORDER — ALUM & MAG HYDROXIDE-SIMETH 200-200-20 MG/5ML PO SUSP: 30.0000 mL | ORAL | Status: DC | PRN

## 2011-08-02 MED ORDER — OXYBUTYNIN CHLORIDE ER 15 MG PO TB24
15.0000 mg | ORAL_TABLET | ORAL | Status: DC
  Filled 2011-08-02: qty 1

## 2011-08-02 MED ORDER — RISPERIDONE 1 MG PO TABS
1.0000 mg | ORAL_TABLET | Freq: Once | ORAL | Status: DC
  Filled 2011-08-02: qty 1

## 2011-08-02 MED ORDER — RISPERIDONE 1 MG PO TABS
1.0000 mg | ORAL_TABLET | ORAL | Status: DC
  Filled 2011-08-02: qty 1

## 2011-08-02 MED ORDER — ACETAMINOPHEN 325 MG PO TABS: 650.0000 mg | ORAL_TABLET | ORAL | Status: DC | PRN

## 2011-08-02 MED ORDER — RISPERIDONE 1 MG PO TABS: 1.0000 mg | ORAL_TABLET | Freq: Two times a day (BID) | ORAL | Status: DC

## 2011-08-02 MED ORDER — ONDANSETRON HCL 8 MG PO TABS: 4.0000 mg | ORAL_TABLET | Freq: Three times a day (TID) | ORAL | Status: DC | PRN

## 2011-08-02 MED ORDER — TERAZOSIN HCL 2 MG PO CAPS
2.0000 mg | ORAL_CAPSULE | Freq: Two times a day (BID) | ORAL | Status: DC
  Administered 2011-08-02 – 2011-08-03 (×2): 2 mg via ORAL
  Filled 2011-08-02 (×2): qty 1

## 2011-08-02 MED ORDER — RISPERIDONE 0.5 MG PO TABS: 0.5000 mg | ORAL_TABLET | Freq: Two times a day (BID) | ORAL | Status: DC

## 2011-08-02 MED ORDER — DIVALPROEX SODIUM ER 250 MG PO TB24
250.0000 mg | ORAL_TABLET | ORAL | Status: DC
  Filled 2011-08-02: qty 1

## 2011-08-02 MED ORDER — LAMOTRIGINE 150 MG PO TABS
150.0000 mg | ORAL_TABLET | ORAL | Status: DC
  Filled 2011-08-02: qty 1

## 2011-08-02 MED ORDER — ZOLPIDEM TARTRATE 5 MG PO TABS: 5.0000 mg | ORAL_TABLET | Freq: Every evening | ORAL | Status: DC | PRN

## 2011-08-02 MED ORDER — DIVALPROEX SODIUM ER 250 MG PO TB24
250.0000 mg | ORAL_TABLET | ORAL | Status: AC
  Administered 2011-08-02: 250 mg via ORAL
  Filled 2011-08-02: qty 1

## 2011-08-02 MED ORDER — OXYBUTYNIN CHLORIDE ER 15 MG PO TB24
15.0000 mg | ORAL_TABLET | Freq: Two times a day (BID) | ORAL | Status: DC
  Administered 2011-08-02 – 2011-08-03 (×2): 15 mg via ORAL
  Filled 2011-08-02 (×2): qty 1

## 2011-08-02 MED ORDER — LAMOTRIGINE 150 MG PO TABS: 150.0000 mg | ORAL_TABLET | Freq: Two times a day (BID) | ORAL | Status: DC

## 2011-08-02 MED ORDER — RISPERIDONE 0.5 MG PO TABS
0.5000 mg | ORAL_TABLET | Freq: Once | ORAL | Status: AC
  Administered 2011-08-02: 0.5 mg via ORAL
  Filled 2011-08-02: qty 1

## 2011-08-02 MED ORDER — TERAZOSIN HCL 2 MG PO CAPS: 2.0000 mg | ORAL_CAPSULE | Freq: Two times a day (BID) | ORAL | Status: DC

## 2011-08-02 MED ORDER — DIVALPROEX SODIUM ER 250 MG PO TB24
250.0000 mg | ORAL_TABLET | Freq: Every day | ORAL | Status: DC
  Filled 2011-08-02: qty 1

## 2011-08-02 NOTE — ED Notes (Signed)
Dr. Molpus at bedside. 

## 2011-08-02 NOTE — ED Notes (Signed)
Pt's spouse Lacy Taglieri left her cell number for any questions or concerns, [phone]

## 2011-08-02 NOTE — ED Notes (Signed)
ACT team at bedside.  

## 2011-08-02 NOTE — ED Provider Notes (Signed)
Evaluated by specialist on call. Will require admission. Medication recommendations place  Dayton Bailiff, MD 08/02/11 1119

## 2011-08-02 NOTE — ED Notes (Signed)
Patient currently sitting up in bed; no respiratory or acute distress noted.  Sitter present at bedside; patient requesting to speak with ACT team about plan of care.  ACT team notified; patient has no other questions or concerns at this time.  Will continue to monitor.

## 2011-08-02 NOTE — ED Notes (Signed)
Patient currently sitting up in bed; no respiratory or acute distress noted.  Patient has finished eating dinner at this time; sitter at bedside.  Patient has no questions or concerns at this time; will continue to monitor.

## 2011-08-02 NOTE — ED Notes (Signed)
Patient presents stating that he isn"t feeling just right.  Wife states that he had a psychotic break last weekend and told her that he put a plastic bag over his head to try to commit suicide.  Went to see Dr. Sandria Manly his neurologist Monday and his medications were changed.  She stated that Dr. Sandria Manly told them to come to [facility] if they felt like he was having another psychotic break and that there was a great ACT team here that would see him.  Patient talks about different things but unable to keep him on one subject.

## 2011-08-02 NOTE — ED Notes (Signed)
This RN administered pt;s at home medications per pt and spouse request and EDP Brooke Dare approval;; Lamictal 150 mg, Oxybutyin 15 mg, Risperdal 0.5 mg, Terazosin 2 mg,

## 2011-08-02 NOTE — ED Provider Notes (Signed)
History     CSN: [id number]  Arrival date & time 08/02/11  0505   First MD Initiated Contact with Patient 08/02/11 [phone]      Chief Complaint  Patient presents with  . Psychiatric Evaluation    (Consider location/radiation/quality/duration/timing/severity/associated sxs/prior treatment) HPI This is a 66 year old white male with a history of temporal lobe epilepsy. He was started on Depakote about 10 days ago. A week ago he experienced a brief psychotic episode during that which she had thoughts of paranoia, lease that people were out to get him. He was seen at [facility] ED. He was evaluated by Dr. Pearlean Brownie, the neurologist on call for Dr. Sandria Manly. He was placed on Risperdal and followed up with Dr. love 2 days later. Dr. Sandria Manly performed an EEG which was by report normal. He was restarted on Depakote 5 days ago after a two-day hiatus. The patient's wife states that yesterday evening he was a little "short" with her. About 4 AM he awoke telling her that he needed to get dressed that he was going to miss every body in this his current life. He was not suicidal but believes that some doom such as death or incarceration was imminent. He is no longer expressing such thoughts but has displayed emotional lability, having a crying episode earlier. He denies physical complaints except that his usual chronic left knee pain is no longer present and he is asking if someone slipped him some medication to treat that. His wife is questioning whether this could be an adverse reaction to Depakote.  Past Medical History  Diagnosis Date  . Epilepsy     History reviewed. No pertinent past surgical history.  History reviewed. No pertinent family history.  History  Substance Use Topics  . Smoking status: Never Smoker   . Smokeless tobacco: Never Used  . Alcohol Use: 1.2 oz/week    2 Glasses of wine per week     wine daily      Review of Systems  All other systems reviewed and are negative.    Allergies    Review of patient's allergies indicates no known allergies.  Home Medications   Current Outpatient Rx  Name Route Sig Dispense Refill  . ALPRAZOLAM 0.25 MG PO TABS Oral Take 0.25 mg by mouth at bedtime as needed.    [name] DIVALPROEX SODIUM ER 250 MG PO TB24 Oral Take 250 mg by mouth daily.    [name] LAMOTRIGINE 150 MG PO TABS Oral Take 150 mg by mouth 2 (two) times daily.    [name] OMEPRAZOLE 20 MG PO CPDR Oral Take 20 mg by mouth daily.    . OXYBUTYNIN CHLORIDE ER 15 MG PO TB24 Oral Take 15 mg by mouth 2 (two) times daily.    [name] RISPERIDONE 0.5 MG PO TABS Oral Take 0.5 mg by mouth 3 (three) times daily.    [name] TADALAFIL 10 MG PO TABS Oral Take 10 mg by mouth daily as needed. For ED    . TERAZOSIN HCL 2 MG PO CAPS Oral Take 2 mg by mouth 2 (two) times daily.      BP 129/69  Pulse 90  Temp(Src) 97.7 F (36.5 C) (Oral)  Resp 22  SpO2 99%  Physical Exam General: Well-developed, well-nourished male in no acute distress; appearance consistent with age of record HENT: normocephalic, atraumatic Eyes: pupils equal round and reactive to light; extraocular muscles intact Neck: supple Heart: regular rate and rhythm Lungs: clear to auscultation bilaterally Abdomen: soft; nondistended; nontender; no  masses or hepatosplenomegaly Extremities: No deformity; full range of motion; pulses normal Neurologic: Awake, alert and oriented; motor function intact in all extremities and symmetric; no facial droop; mild right pronator drift; normal finger to nose Skin: Warm and dry Psychiatric: Normal mood and affect; no SI or HI    ED Course  Procedures (including critical care time)     MDM   Nursing notes and vitals signs, including pulse oximetry, reviewed.  Summary of this visit's results, reviewed by myself:  Labs:  Results for orders placed during the hospital encounter of 08/02/11  ETHANOL      Component Value Range   Alcohol, Ethyl (B) <11  0 - 11 (mg/dL)  CBC      Component Value Range   WBC  5.8  4.0 - 10.5 (K/uL)   RBC 4.80  4.22 - 5.81 (MIL/uL)   Hemoglobin 14.0  13.0 - 17.0 (g/dL)   HCT 16.1  09.6 - 04.5 (%)   MCV 88.3  78.0 - 100.0 (fL)   MCH 29.2  26.0 - 34.0 (pg)   MCHC 33.0  30.0 - 36.0 (g/dL)   RDW 40.9  81.1 - 91.4 (%)   Platelets 266  150 - 400 (K/uL)  DIFFERENTIAL      Component Value Range   Neutrophils Relative 69  43 - 77 (%)   Neutro Abs 4.0  1.7 - 7.7 (K/uL)   Lymphocytes Relative 21  12 - 46 (%)   Lymphs Abs 1.2  0.7 - 4.0 (K/uL)   Monocytes Relative 8  3 - 12 (%)   Monocytes Absolute 0.4  0.1 - 1.0 (K/uL)   Eosinophils Relative 2  0 - 5 (%)   Eosinophils Absolute 0.1  0.0 - 0.7 (K/uL)   Basophils Relative 0  0 - 1 (%)   Basophils Absolute 0.0  0.0 - 0.1 (K/uL)  POCT I-STAT, CHEM 8      Component Value Range   Sodium 141  135 - 145 (mEq/L)   Potassium 3.7  3.5 - 5.1 (mEq/L)   Chloride 106  96 - 112 (mEq/L)   BUN 23  6 - 23 (mg/dL)   Creatinine, Ser 7.82  0.50 - 1.35 (mg/dL)   Glucose, Bld 95  70 - 99 (mg/dL)   Calcium, Ion 9.56  2.13 - 1.32 (mmol/L)   TCO2 25  0 - 100 (mmol/L)   Hemoglobin 14.3  13.0 - 17.0 (g/dL)   HCT 08.6  57.8 - 46.9 (%)  VALPROIC ACID LEVEL      Component Value Range   Valproic Acid Lvl 34.7 (*) 50.0 - 100.0 (ug/mL)   7:45 AM Patient awaiting tele-psychiatry consult. Patient's care discussed with Dr. Brooke Dare, the relieving physician.          Hanley Seamen, MD 08/02/11 [phone]

## 2011-08-02 NOTE — ED Notes (Signed)
Patient is resting comfortably, rr even and unlabored, will continue to monitor, sitter at bedside

## 2011-08-02 NOTE — ED Notes (Signed)
Per spouse pt's Lamictal was increased in November 2012 w/the plan of gradually weaning pt off of Lyrica, pt started on Depakote every am 7-10 days ago by Dr. Sandria Manly, pt began showing signs of paranoia last weekend, Dr. Sandria Manly then switched Depakote to HS, spouse reports pt woke her up early this am w/same symptoms

## 2011-08-02 NOTE — ED Notes (Signed)
Patient is resting comfortably, rr even and unlabored, will continue to monitor 

## 2011-08-02 NOTE — ED Notes (Signed)
Patient currently sitting up in bed; no respiratory or acute distress noted.  Patient has no questions or concerns at this time; will continue to monitor.  Sitter present at bedside.

## 2011-08-02 NOTE — BHH Counselor (Signed)
Information faxed to [facility] and [facility] and [facility] for placement.

## 2011-08-02 NOTE — BH Assessment (Signed)
Assessment Note   John Holt is an 66 y.o. male male who is being presented to the ED for having a psychotic episode.  He was having thoughts of paranoia and thoughts that he had harm and maybe killed his wife.  He and his wife reports that he had a similar episode last weekend and it is believe to be a result of the interaction between medications, they were recently changed.  He has been diagnosised with epilepsy.  The pt. Has some insight on his condition and is seeking help voluntarily in order to become psychiatrically stable.   Wife reports that his last episode of psychosis was in 2006 and he had to be hospitalized due to it and he is displaying some of the same symptoms.  The symptoms includes paranoia and and flight of ideas.  During the assessment he was cooperative and pleasant.   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 Epilepsy    Past Medical History:  Past Medical History  Diagnosis Date  . Epilepsy    Axis III:  Past Medical History  Diagnosis Date  . Epilepsy    Axis V: 21-30 behavior considerably influenced by delusions or hallucinations OR serious impairment in judgment, communication OR inability to function in almost all areas History reviewed. No pertinent past surgical history.  Family History: History reviewed. No pertinent family history.  Social History:  reports that he has never smoked. He has never used smokeless tobacco. He reports that he drinks about 1.2 ounces of alcohol per week. He reports that he does not use illicit drugs.  Additional Social History:  Alcohol / Drug Use Pain Medications: None reported Over the Counter: None reported History of alcohol / drug use?: No history of alcohol / drug abuse Longest period of sobriety (when/how long): NA Allergies: No Known Allergies  Home Medications:  Medications Prior to Admission  Medication Dose Route  Frequency Provider Last Rate Last Dose  . acetaminophen (TYLENOL) tablet 650 mg  650 mg Oral Q4H PRN Dayton Bailiff, MD      . alum & mag hydroxide-simeth (MAALOX/MYLANTA) 200-200-20 MG/5ML suspension 30 mL  30 mL Oral PRN Dayton Bailiff, MD      . ibuprofen (ADVIL,MOTRIN) tablet 600 mg  600 mg Oral Q8H PRN Dayton Bailiff, MD      . ondansetron [facility]) tablet 4 mg  4 mg Oral Q8H PRN Dayton Bailiff, MD      . risperiDONE (RISPERDAL) tablet 1 mg  1 mg Oral BID Dayton Bailiff, MD      . risperiDONE (RISPERDAL) tablet 1 mg  1 mg Oral Once Dayton Bailiff, MD      . zolpidem [facility]) tablet 5 mg  5 mg Oral QHS PRN Dayton Bailiff, MD       Medications Prior to Admission  Medication Sig Dispense Refill  . divalproex (DEPAKOTE ER) 250 MG 24 hr tablet Take 250 mg by mouth daily.      [name] lamoTRIgine (LAMICTAL) 150 MG tablet Take 150 mg by mouth 2 (two) times daily.      [name] omeprazole (PRILOSEC) 20 MG capsule Take 20 mg by mouth daily.      [name] oxybutynin (DITROPAN XL) 15 MG 24 hr tablet Take 15 mg by mouth 2 (two) times daily.      . risperiDONE (RISPERDAL) 0.5 MG tablet Take 0.5 mg by mouth 3 (three) times daily.      [name]  tadalafil (CIALIS) 10 MG tablet Take 10 mg by mouth daily as needed. For ED      . terazosin (HYTRIN) 2 MG capsule Take 2 mg by mouth 2 (two) times daily.        OB/GYN Status:  No LMP for male patient.  General Assessment Data Location of Assessment: [facility] ED ACT Assessment: Yes Living Arrangements: Spouse/significant other Can pt return to current living arrangement?: Yes Admission Status: Voluntary Is patient capable of signing voluntary admission?: Yes Transfer from: Home Referral Source: Self/Family/Friend  Education Status Is patient currently in school?: No  Risk to self Suicidal Ideation: No Suicidal Intent: No Is patient at risk for suicide?: No Suicidal Plan?: No Access to Means: No What has been your use of drugs/alcohol within the last 12 months?: None noted Previous  Attempts/Gestures: No How many times?: 0  Other Self Harm Risks: None noted Triggers for Past Attempts: None known Intentional Self Injurious Behavior: None Family Suicide History: No Recent stressful life event(s): Other (Comment) (Medical problems/Epileptic) Persecutory voices/beliefs?: No Depression: No Substance abuse history and/or treatment for substance abuse?: No Suicide prevention information given to non-admitted patients: Not applicable  Risk to Others Homicidal Ideation: No Thoughts of Harm to Others: No Current Homicidal Intent: No Current Homicidal Plan: No Access to Homicidal Means: No Identified Victim: None noted History of harm to others?: No Assessment of Violence: None Noted Violent Behavior Description: None noted Does patient have access to weapons?: No Criminal Charges Pending?: No Does patient have a court date: No  Psychosis Hallucinations: None noted Delusions: Persecutory  Mental Status Report Appear/Hygiene: Other (Comment) (Remarkable ) Eye Contact: Good Motor Activity: Freedom of movement Speech: Logical/coherent Level of Consciousness: Alert Mood: Depressed;Empty;Guilty Affect: Appropriate to circumstance Anxiety Level: Moderate Thought Processes: Coherent;Relevant Judgement: Unimpaired Orientation: Person;Place;Time;Situation Obsessive Compulsive Thoughts/Behaviors: Minimal  Cognitive Functioning Concentration: Normal Memory: Recent Intact;Remote Intact IQ: Above Average Insight: Good Impulse Control: Fair Appetite: Good Weight Loss: 0  Weight Gain: 0  Sleep: No Change Total Hours of Sleep: 8  Vegetative Symptoms: None  Prior Inpatient Therapy Prior Inpatient Therapy: Yes Prior Therapy Dates: 2006 Prior Therapy Facilty/Provider(s): [facility] Reason for Treatment: Psychotic Episode  Prior Outpatient Therapy Prior Outpatient Therapy: No          Abuse/Neglect Assessment (Assessment to be complete while patient is  alone) Physical Abuse: Denies Verbal Abuse: Denies Sexual Abuse: Denies Exploitation of patient/patient's resources: Denies Self-Neglect: Denies Values / Beliefs Cultural Requests During Hospitalization: None Spiritual Requests During Hospitalization: None Consults Spiritual Care Consult Needed: No Social Work Consult Needed: No   Nutrition Screen Diet: Regular  Additional Information 1:1 In Past 12 Months?: No CIRT Risk: No Elopement Risk: No Does patient have medical clearance?: Yes     Disposition:  Disposition Disposition of Patient: Inpatient treatment program Type of inpatient treatment program: Adult  On Site Evaluation by:   Reviewed with Physician:     Morley Kos MS, LCAS, LPCA, NCC 08/02/2011 1:56 PM

## 2011-08-02 NOTE — ED Notes (Signed)
Family at bedside. 

## 2011-08-02 NOTE — ED Notes (Signed)
Received bedside report from [place], [place].  Patient currently sitting up in bed; no respiratory or acute distress noted.  Sitter at bedside.  Patient currently eating dinner tray; given decaf coffee.  Patient has no other questions or concerns at this time.  Will continue to monitor.

## 2011-08-02 NOTE — ED Notes (Signed)
Pts wife with pt.  Wife reports that pt had a psychotic episode last weekend and started to have another episode this am.  Reports that pt had a hx of the same in 2006.  Denies SI or HI, denies visual and auditory hallucinations.

## 2011-08-02 NOTE — ED Notes (Signed)
Still waiting for pharmacy to deliver Protonix.

## 2011-08-02 NOTE — ED Notes (Signed)
Pt and family updated on plan of care, waiting for telepych consult, family and pt requesting pt take his own medications until his daily meds are updated in our system, pt is paranoid, states "yall are talking about me in a 3rd person and plotting against me, this is like a fiction novel and I am the main character." This RN and wife explained to pt we were not plotting against him and we had all intentions of keeping him updated on his plan of care and his wife was only trying to express her concerns. EDP King aware of pt's request to take his own home medications d/t paranoia, EDP Brooke Dare is okay with pt taking his own medications.

## 2011-08-02 NOTE — ED Notes (Addendum)
Pt got very irate with me could not understand that I was not lying to him. He wanted his wife right that second. I told him she was coming right back. He kept telling me that I was a lier and so was the charge nurse Kristi. He told her to stop being so G*d D**n cheerful and calm. He wanted her here right that second and to stop playing games with him.   The sittter wanted to go eat her sandwhich so I had to stay in there with him and his wife came. He kept sobing and asking her if he had ever hurt her or had he ever been violent with her. She replied  No not ever. But he did not believe it and he kept asking over and over and over. He finally began to settle down and relax abit with his wife sitting by his side. Kristi the charge nurse is checking on getting him some meds to calm down. 10:13 am JG

## 2011-08-03 MED ORDER — LAMOTRIGINE 100 MG PO TABS: 100.0000 mg | ORAL_TABLET | Freq: Two times a day (BID) | ORAL | Status: DC

## 2011-08-03 NOTE — Discharge Instructions (Signed)
Psychosis  Psychosis refers to a severe lack of understanding with reality. During a psychotic episode, you are not able to think clearly. During a psychotic episode, your responses and emotions are inappropriate and do not coincide with what is actually happening. You often have false beliefs about what is happening or who you are (delusions), and you may see, hear, taste, smell, or feel things that are not present (hallucinations). Psychosis is usually a severe symptom of a very serious mental health (psychiatric) condition, but it can sometimes be the result of a medical condition.  CAUSES    Psychiatric conditions, such as:   Schizophrenia.   Bipolar disorder.   Depression.   Personality disorders.   Alcohol or drug abuse.   Medical conditions, such as:   Brain injury.   Brain tumor.   Dementia.   Brain diseases, such as Alzheimer's, Parkinson's, or Huntington's disease.   Neurological diseases, such as epilepsy.   Genetic disorders.   Metabolic disorders.   Infections that affect the brain.   Certain prescription drugs.   Stroke.  SYMPTOMS    Unable to think or speak clearly or respond appropriately.   Disorganized thinking (thoughts jump from one thought to another).   Severe inappropriate behavior.   Delusions may include:   A strong belief that is odd, unrealistic, or false.   Feeling extremely fearful or suspicious (paranoid).   Believing you are someone else, have high importance, or have an altered identity.   Hallucinations.  DIAGNOSIS    Mental health evaluation.   Physical exam.   Blood tests.   Computerized magnetic scan (MRI) or other brain scans.  TREATMENT   Your caregiver will recommend a course of treatment that depends on the cause of the psychosis.  Treatment may include:   Monitoring and supportive care in the hospital.   Taking medicines (antipsychotic medicine) to reduce symptoms and balance chemicals in the brain.   Taking medicines to manage underlying  mental health conditions.   Therapy and other supportive programs outside of the hospital.   Treating an underlying medical condition.  If the cause of the psychosis can be treated or corrected, the outlook is good. Without treatment, psychotic episodes can cause danger to yourself or others. Treatment may be short-term or lifelong.  HOME CARE INSTRUCTIONS    Take all medicines as directed. This is important.   Use a pillbox or write down your medicine schedule to make sure you are taking them.   Check with your caregiver before using over-the-counter medicines, herbs, or supplements.   Seek individual and family support through therapy and mental health education (psychoeducation) programs. These will help you manage symptoms and side effects of medicines, learn life skills, and maintain a healthy routine.   Maintain a healthy lifestyle.   Exercise regularly.   Avoid alcohol and drugs.   Learn ways to reduce stress and cope with stress, such as yoga and meditation.   Talk about your feelings with family members or caregivers.   Make time for yourself to do things you enjoy.   Know the early warning signs of psychosis. Your caregiver will recommend steps to take when you notice symptoms such as:   Feeling anxious or preoccupied.   Having racing thoughts.   Changes in your interest in life and relationships.   Follow up with your caregivers for continued outpatient treatment as directed.  SEEK MEDICAL CARE IF:    Medicines do not seem to be helping.   You hear   or feel things that are not there.   You feel hopeless and overwhelmed.   You feel extremely fearful and suspicious that something will harm you.   You feel like you cannot leave your house.   You have trouble taking care of yourself.   You experience side effects of medicines, such as changes in sleep patterns, dizziness, weight  gain, restlessness, movement changes, muscle spasms, or tremors.  SEEK IMMEDIATE MEDICAL CARE IF:  Severe psychotic symptoms present a safety issue (such as an urge to hurt yourself or others). MAKE SURE YOU:   Understand these instructions.   Will watch your condition.   Will get help right away if you are not doing well or get worse.  FOR MORE INFORMATION  National Institute of Mental Health: http://www.maynard.net/ Document Released: 10/02/2009 Document Revised: 04/03/2011 Document Reviewed: 10/02/2009 [facility] Patient Information 2012 [place], [place].  Medical conditions can worsen, so it is also important to return immediately as directed below, or if you have other serious concerns develop. RETURN IMMEDIATELY IF you develop new shortness of breath, chest pain, fever, have difficulty moving parts of your body (new weakness, numbness, or incoordination), sudden change in speech, vision, swallowing, or understanding, faint or develop new dizziness, severe headache, become poorly responsive or have an altered mental status compared to baseline for you, new rash, abdominal pain, or bloody stools,  Return sooner also if you develop new problems for which you have not talked to your caregiver but you feel may be emergency medical conditions, or are unable to be cared for safely at home.  Be sure to call your caregiver and arrange for follow-up care as suggested by our staff. RETURN IMMEDIATELY IF DEVELOP threat to harm self or others, suicidal or homicidal thoughts, hallucinations or confusion, unable to be cared for at home or uncontrolled behavior, or other concerns.

## 2011-08-03 NOTE — ED Provider Notes (Signed)
The patient is much better in the ED without psychosis, he is lucid calm cooperative following commands and has adjusted his medication doses feeling much better wants to go home as does his family is not a threat to himself or others he has an appointment tomorrow with neurology to further discuss medication management at this time I do not feel he needs admission to the hospital and he and his family agree.  Hurman Horn, MD 08/04/11 2122

## 2011-08-03 NOTE — ED Notes (Signed)
Breakfast tray ordered 

## 2011-08-03 NOTE — ED Notes (Signed)
Per ACT team, patient under review at [facility], [place], and [place].

## 2011-08-03 NOTE — ED Notes (Signed)
Patient currently resting quietly in bed; no respiratory or acute distress noted.  Patient has no questions or concerns at this time; offered warm blanket; patient refused.  Sitter present at bedside; will continue to monitor.

## 2011-08-03 NOTE — ED Notes (Signed)
Patient currently asleep in bed; no respiratory or acute distress noted.  Will continue to monitor. 

## 2011-08-03 NOTE — ED Notes (Signed)
Patient awake; requesting coffee and orange juice.  Patient given coffee and orange juice; sitter present at bedside.  Patient has no other questions or concerns at this time; will continue to monitor.

## 2011-08-03 NOTE — ED Notes (Signed)
Patient currently resting quietly in bed; no respiratory or acute distress noted.  Sitter present at bedside.  Patient updated on plan of care; informed patient that breakfast tray ordered.  Patient has no other questions or concerns at this time; will continue to monitor.

## 2011-08-03 NOTE — ED Notes (Addendum)
Patient currently asleep in bed; no respiratory or acute distress noted.  Sitter present at bedside.  Will continue to monitor. 

## 2011-08-03 NOTE — ED Notes (Signed)
Patient currently resting quietly in bed; no respiratory or acute distress noted.  Respirations 18 per minute; unlabored.  Sitter present at bedside.  Will continue to monitor.

## 2011-08-03 NOTE — ED Notes (Signed)
Patient currently asleep in bed; no respiratory or acute distress noted.  Sitter present at bedside.  Will continue to monitor. 

## 2011-08-03 NOTE — BH Assessment (Signed)
Assessment Note   John Holt is an 66 y.o. male who was presented to the ED due to having psychosis and paranoia.  He currently is at his baseline per his report and his wife.  He further reports that he is not having no SI/HI.  He also reports that he is not having any hallucinations.  He was oriented to time, date, place and situation.  His mood and affect were appropriate to situation.  Axis I: Psychotic Disorder NOS  Past Medical History:  Past Medical History  Diagnosis Date  . Epilepsy     History reviewed. No pertinent past surgical history.  Family History: History reviewed. No pertinent family history.  Social History:  reports that he has never smoked. He has never used smokeless tobacco. He reports that he drinks about 1.2 ounces of alcohol per week. He reports that he does not use illicit drugs.  Additional Social History:  Alcohol / Drug Use Pain Medications: None reported Over the Counter: None reported History of alcohol / drug use?: No history of alcohol / drug abuse Longest period of sobriety (when/how long): NA Allergies: No Known Allergies  Home Medications:  Medications Prior to Admission  Medication Dose Route Frequency Provider Last Rate Last Dose  . acetaminophen (TYLENOL) tablet 650 mg  650 mg Oral Q4H PRN Dayton Bailiff, MD      . alum & mag hydroxide-simeth (MAALOX/MYLANTA) 200-200-20 MG/5ML suspension 30 mL  30 mL Oral PRN Dayton Bailiff, MD      . divalproex (DEPAKOTE ER) 24 hr tablet 250 mg  250 mg Oral To Minor Dione Booze, MD   250 mg at 08/02/11 2155  . divalproex (DEPAKOTE ER) 24 hr tablet 250 mg  250 mg Oral QHS Dione Booze, MD      . ibuprofen (ADVIL,MOTRIN) tablet 600 mg  600 mg Oral Q8H PRN Dayton Bailiff, MD      . lamoTRIgine (LAMICTAL) tablet 150 mg  150 mg Oral BID Dione Booze, MD   150 mg at 08/03/11 1209  . ondansetron (ZOFRAN) tablet 4 mg  4 mg Oral Q8H PRN Dayton Bailiff, MD      . oxybutynin (DITROPAN XL) 24 hr tablet 15 mg  15 mg Oral BID  Dione Booze, MD   15 mg at 08/03/11 1211  . pantoprazole (PROTONIX) EC tablet 40 mg  40 mg Oral Q1200 Dayton Bailiff, MD   40 mg at 08/03/11 1208  . risperiDONE (RISPERDAL) tablet 0.5 mg  0.5 mg Oral Once Dayton Bailiff, MD   0.5 mg at 08/02/11 1432  . risperiDONE (RISPERDAL) tablet 1 mg  1 mg Oral BID Dayton Bailiff, MD   1 mg at 08/03/11 1208  . terazosin (HYTRIN) capsule 2 mg  2 mg Oral BID Dione Booze, MD   2 mg at 08/03/11 1209  . zolpidem (AMBIEN) tablet 5 mg  5 mg Oral QHS PRN Dayton Bailiff, MD      . DISCONTD: divalproex (DEPAKOTE ER) 24 hr tablet 250 mg  250 mg Oral Daily Dayton Bailiff, MD      . DISCONTD: divalproex (DEPAKOTE ER) 24 hr tablet 250 mg  250 mg Oral To Minor Dione Booze, MD      . DISCONTD: divalproex (DEPAKOTE ER) 24 hr tablet 250 mg  250 mg Oral QHS Dione Booze, MD      . DISCONTD: lamoTRIgine (LAMICTAL) tablet 150 mg  150 mg Oral BID Dayton Bailiff, MD      . DISCONTD: lamoTRIgine (LAMICTAL) tablet  150 mg  150 mg Oral To Minor Dione Booze, MD      . DISCONTD: oxybutynin (DITROPAN XL) 24 hr tablet 15 mg  15 mg Oral BID Dayton Bailiff, MD      . DISCONTD: oxybutynin (DITROPAN XL) 24 hr tablet 15 mg  15 mg Oral To Minor Dione Booze, MD      . DISCONTD: risperiDONE (RISPERDAL) tablet 0.5 mg  0.5 mg Oral BID Dayton Bailiff, MD      . DISCONTD: risperiDONE (RISPERDAL) tablet 1 mg  1 mg Oral BID Dayton Bailiff, MD      . DISCONTD: risperiDONE (RISPERDAL) tablet 1 mg  1 mg Oral Once Dayton Bailiff, MD      . DISCONTD: risperiDONE (RISPERDAL) tablet 1 mg  1 mg Oral To Minor Dione Booze, MD      . DISCONTD: terazosin (HYTRIN) capsule 2 mg  2 mg Oral BID Dayton Bailiff, MD      . DISCONTD: terazosin (HYTRIN) capsule 2 mg  2 mg Oral To Minor Dione Booze, MD       Medications Prior to Admission  Medication Sig Dispense Refill  . lamoTRIgine (LAMICTAL) 150 MG tablet Take 150 mg by mouth 2 (two) times daily.      [name] terazosin (HYTRIN) 2 MG capsule Take 2 mg by mouth 2 (two) times daily.        OB/GYN Status:   No LMP for male patient.  General Assessment Data Location of Assessment: [facility] ED ACT Assessment: Yes Living Arrangements: Spouse/significant other Can pt return to current living arrangement?: Yes Admission Status: Voluntary Is patient capable of signing voluntary admission?: Yes Transfer from: Home Referral Source: Self/Family/Friend  Education Status Is patient currently in school?: No  Risk to self Suicidal Ideation: No Suicidal Intent: No Is patient at risk for suicide?: No Suicidal Plan?: No Access to Means: No What has been your use of drugs/alcohol within the last 12 months?: None noted Previous Attempts/Gestures: No How many times?: 0  Other Self Harm Risks: None noted Triggers for Past Attempts: None known Intentional Self Injurious Behavior: None Family Suicide History: No Recent stressful life event(s): Other (Comment) (Medical problems Epileptic) Persecutory voices/beliefs?: No Depression: No Substance abuse history and/or treatment for substance abuse?: No Suicide prevention information given to non-admitted patients: Not applicable  Risk to Others Homicidal Ideation: No Thoughts of Harm to Others: No Current Homicidal Intent: No Current Homicidal Plan: No Access to Homicidal Means: No Identified Victim: None noted History of harm to others?: No Assessment of Violence: None Noted Violent Behavior Description: None noted Does patient have access to weapons?: No Criminal Charges Pending?: No Does patient have a court date: No  Psychosis Hallucinations: None noted Delusions: None noted  Mental Status Report Appear/Hygiene: Other (Comment) (Good Hygiene) Eye Contact: Good Motor Activity: Freedom of movement Speech: Logical/coherent Level of Consciousness: Alert Mood: Other (Comment) (At baseline) Affect: Appropriate to circumstance Anxiety Level: None Thought Processes: Coherent;Relevant Judgement: Unimpaired Orientation:  Person;Place;Time;Situation Obsessive Compulsive Thoughts/Behaviors: None  Cognitive Functioning Concentration: Normal Memory: Recent Intact;Remote Intact IQ: Above Average Insight: Good Impulse Control: Good Appetite: Good Weight Loss: 0  Weight Gain: 0  Sleep: No Change Total Hours of Sleep: 8  Vegetative Symptoms: None  Prior Inpatient Therapy Prior Inpatient Therapy: Yes Prior Therapy Dates: 2006 Prior Therapy Facilty/Provider(s): [facility] Reason for Treatment: Psychotic Episode  Prior Outpatient Therapy Prior Outpatient Therapy: No  ADL Screening (condition at time of admission) Communication: Independent Dressing (OT): Independent Grooming: Independent Feeding: Independent  Bathing: Independent Toileting: Independent In/Out Bed: Independent Walks in Home: Independent       Abuse/Neglect Assessment (Assessment to be complete while patient is alone) Physical Abuse: Denies Verbal Abuse: Denies Sexual Abuse: Denies Exploitation of patient/patient's resources: Denies Self-Neglect: Denies Values / Beliefs Cultural Requests During Hospitalization: None Spiritual Requests During Hospitalization: None Consults Spiritual Care Consult Needed: No Social Work Consult Needed: No   Nutrition Screen Diet: Regular  Additional Information 1:1 In Past 12 Months?: No CIRT Risk: No Elopement Risk: No Does patient have medical clearance?: Yes     Disposition:  Disposition Disposition of Patient: Outpatient treatment Type of inpatient treatment program: Adult Type of outpatient treatment: Adult  On Site Evaluation by:   Reviewed with Physician:     Morley Kos 08/03/2011 3:48 PM

## 2011-08-03 NOTE — B[facility] Counselor (Signed)
Pt. Was alert and oriented.  Per his wife he was at his baseline.  However, he hasn't taking his medications when counselor went to do the assessment.  Counselor informed the pt. That he would be back after 20 minutes to see how he ws doing.  Per report of wife after 20 minutes of him taking his medication he display the symptoms of psychosis.

## 2011-08-03 NOTE — ED Notes (Signed)
Patient currently sitting up in bed drinking; no respiratory or acute distress noted.  Patient has no questions or concerns at this time; sitter present at bedside.  Will continue to monitor.

## 2011-08-05 LAB — LAMOTRIGINE LEVEL: Lamotrigine Lvl: 26.3 UG/ML — ABNORMAL HIGH (ref 3.0–14.0)

## 2011-10-15 DIAGNOSIS — G40219 Localization-related (focal) (partial) symptomatic epilepsy and epileptic syndromes with complex partial seizures, intractable, without status epilepticus: Secondary | ICD-10-CM | POA: Diagnosis not present

## 2011-11-20 DIAGNOSIS — S20219A Contusion of unspecified front wall of thorax, initial encounter: Secondary | ICD-10-CM | POA: Diagnosis not present

## 2011-11-20 DIAGNOSIS — R079 Chest pain, unspecified: Secondary | ICD-10-CM | POA: Diagnosis not present

## 2011-12-09 DIAGNOSIS — L989 Disorder of the skin and subcutaneous tissue, unspecified: Secondary | ICD-10-CM | POA: Diagnosis not present

## 2011-12-09 DIAGNOSIS — S20219A Contusion of unspecified front wall of thorax, initial encounter: Secondary | ICD-10-CM | POA: Diagnosis not present

## 2011-12-09 DIAGNOSIS — R569 Unspecified convulsions: Secondary | ICD-10-CM | POA: Diagnosis not present

## 2011-12-09 DIAGNOSIS — F411 Generalized anxiety disorder: Secondary | ICD-10-CM | POA: Diagnosis not present

## 2012-02-04 DIAGNOSIS — Z23 Encounter for immunization: Secondary | ICD-10-CM | POA: Diagnosis not present

## 2012-02-16 DIAGNOSIS — G40119 Localization-related (focal) (partial) symptomatic epilepsy and epileptic syndromes with simple partial seizures, intractable, without status epilepticus: Secondary | ICD-10-CM | POA: Diagnosis not present

## 2012-02-16 DIAGNOSIS — G40219 Localization-related (focal) (partial) symptomatic epilepsy and epileptic syndromes with complex partial seizures, intractable, without status epilepticus: Secondary | ICD-10-CM | POA: Diagnosis not present

## 2012-03-30 DIAGNOSIS — H6120 Impacted cerumen, unspecified ear: Secondary | ICD-10-CM | POA: Diagnosis not present

## 2012-03-30 DIAGNOSIS — H9190 Unspecified hearing loss, unspecified ear: Secondary | ICD-10-CM | POA: Diagnosis not present

## 2012-04-15 DIAGNOSIS — H906 Mixed conductive and sensorineural hearing loss, bilateral: Secondary | ICD-10-CM | POA: Diagnosis not present

## 2012-07-15 DIAGNOSIS — H01009 Unspecified blepharitis unspecified eye, unspecified eyelid: Secondary | ICD-10-CM | POA: Diagnosis not present

## 2012-07-15 DIAGNOSIS — H25019 Cortical age-related cataract, unspecified eye: Secondary | ICD-10-CM | POA: Diagnosis not present

## 2012-07-15 DIAGNOSIS — H5200 Hypermetropia, unspecified eye: Secondary | ICD-10-CM | POA: Diagnosis not present

## 2012-07-15 DIAGNOSIS — H3530 Unspecified macular degeneration: Secondary | ICD-10-CM | POA: Diagnosis not present

## 2012-07-19 DIAGNOSIS — Z0000 Encounter for general adult medical examination without abnormal findings: Secondary | ICD-10-CM | POA: Diagnosis not present

## 2012-07-19 DIAGNOSIS — Z125 Encounter for screening for malignant neoplasm of prostate: Secondary | ICD-10-CM | POA: Diagnosis not present

## 2012-07-19 DIAGNOSIS — R569 Unspecified convulsions: Secondary | ICD-10-CM | POA: Diagnosis not present

## 2012-07-22 DIAGNOSIS — R339 Retention of urine, unspecified: Secondary | ICD-10-CM | POA: Diagnosis not present

## 2012-07-22 DIAGNOSIS — N3941 Urge incontinence: Secondary | ICD-10-CM | POA: Diagnosis not present

## 2012-07-26 DIAGNOSIS — M1990 Unspecified osteoarthritis, unspecified site: Secondary | ICD-10-CM | POA: Diagnosis not present

## 2012-07-26 DIAGNOSIS — F411 Generalized anxiety disorder: Secondary | ICD-10-CM | POA: Diagnosis not present

## 2012-07-26 DIAGNOSIS — N400 Enlarged prostate without lower urinary tract symptoms: Secondary | ICD-10-CM | POA: Diagnosis not present

## 2012-07-26 DIAGNOSIS — R569 Unspecified convulsions: Secondary | ICD-10-CM | POA: Diagnosis not present

## 2012-07-26 DIAGNOSIS — Z0000 Encounter for general adult medical examination without abnormal findings: Secondary | ICD-10-CM | POA: Diagnosis not present

## 2012-08-06 DIAGNOSIS — N1330 Unspecified hydronephrosis: Secondary | ICD-10-CM | POA: Diagnosis not present

## 2012-08-06 DIAGNOSIS — R339 Retention of urine, unspecified: Secondary | ICD-10-CM | POA: Diagnosis not present

## 2012-08-11 ENCOUNTER — Ambulatory Visit: Payer: Self-pay | Admitting: Diagnostic Neuroimaging

## 2012-08-11 ENCOUNTER — Encounter: Payer: Self-pay | Admitting: Diagnostic Neuroimaging

## 2012-08-11 ENCOUNTER — Ambulatory Visit (INDEPENDENT_AMBULATORY_CARE_PROVIDER_SITE_OTHER): Payer: Medicare Other | Admitting: Diagnostic Neuroimaging

## 2012-08-11 VITALS — BP 96/61 | HR 63 | Temp 97.8°F | Ht 68.0 in | Wt 158.0 lb

## 2012-08-11 DIAGNOSIS — G40109 Localization-related (focal) (partial) symptomatic epilepsy and epileptic syndromes with simple partial seizures, not intractable, without status epilepticus: Secondary | ICD-10-CM | POA: Diagnosis not present

## 2012-08-11 DIAGNOSIS — G40219 Localization-related (focal) (partial) symptomatic epilepsy and epileptic syndromes with complex partial seizures, intractable, without status epilepticus: Secondary | ICD-10-CM | POA: Insufficient documentation
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
## 2012-08-11 NOTE — Patient Instructions (Signed)
Monitor symptoms. If symptoms worsen, we'll consider video EEG and sleep study evaluations.

## 2013-01-28 DIAGNOSIS — Z6825 Body mass index (BMI) 25.0-25.9, adult: Secondary | ICD-10-CM | POA: Diagnosis not present

## 2013-01-28 DIAGNOSIS — K429 Umbilical hernia without obstruction or gangrene: Secondary | ICD-10-CM | POA: Diagnosis not present

## 2013-01-28 DIAGNOSIS — K4090 Unilateral inguinal hernia, without obstruction or gangrene, not specified as recurrent: Secondary | ICD-10-CM | POA: Diagnosis not present

## 2013-02-01 DIAGNOSIS — K429 Umbilical hernia without obstruction or gangrene: Secondary | ICD-10-CM | POA: Diagnosis not present

## 2013-02-01 DIAGNOSIS — K4090 Unilateral inguinal hernia, without obstruction or gangrene, not specified as recurrent: Secondary | ICD-10-CM | POA: Diagnosis not present

## 2013-02-11 ENCOUNTER — Telehealth: Payer: Self-pay | Admitting: Diagnostic Neuroimaging

## 2013-02-11 NOTE — Telephone Encounter (Signed)
Spoke with patient and he said that he has been  having an intensification of seizure events in his sleep for the last couple of weeks,having ticking in his head while sleeping, headaches, slight amnesia.  Last Monday had 4 seizures in one evening,experienced a panic attack this morning which lasted about 40 minutes.  He took one .25mg  zanax tablet and was fine, wanted to know if he should schedule an appt.He was  Last seen 04/14

## 2013-02-17 ENCOUNTER — Encounter (INDEPENDENT_AMBULATORY_CARE_PROVIDER_SITE_OTHER): Payer: Self-pay | Admitting: General Surgery

## 2013-02-17 ENCOUNTER — Ambulatory Visit (INDEPENDENT_AMBULATORY_CARE_PROVIDER_SITE_OTHER): Payer: Medicare Other | Admitting: General Surgery

## 2013-02-17 ENCOUNTER — Telehealth (INDEPENDENT_AMBULATORY_CARE_PROVIDER_SITE_OTHER): Payer: Self-pay | Admitting: General Surgery

## 2013-02-17 VITALS — BP 98/60 | HR 76 | Temp 98.2°F | Resp 15 | Ht 68.0 in | Wt 163.8 lb

## 2013-02-17 DIAGNOSIS — K429 Umbilical hernia without obstruction or gangrene: Secondary | ICD-10-CM

## 2013-02-17 DIAGNOSIS — K4020 Bilateral inguinal hernia, without obstruction or gangrene, not specified as recurrent: Secondary | ICD-10-CM

## 2013-02-17 NOTE — Patient Instructions (Signed)
Please call the office to schedule surgery when you are ready. Please call 6 weeks before your desired surgical date  Hernia Repair with Laparoscope A hernia occurs when an internal organ pushes out through a weak spot in the belly (abdominal) wall muscles. Hernias most commonly occur in the groin and around the navel. Hernias can also occur through a cut by the surgeon (incision) after an abdominal operation. A hernia may be caused by:  Lifting heavy objects.  Prolonged coughing.  Straining to move your bowels. Hernias can often be pushed back into place (reduced). Most hernias tend to get worse over time. Problems occur when abdominal contents get stuck in the opening and the blood supply is blocked or impaired (incarcerated hernia). Because of these risks, you require surgery to repair the hernia. Your hernia will be repaired using a laparoscope. Laparoscopic surgery is a type of minimally invasive surgery. It does not involve making a typical surgical cut (incision) in the skin. A laparoscope is a telescope-like rod and lens system. It is usually connected to a video camera and a light source so your caregiver can clearly see the operative area. The instruments are inserted through  to  inch (5 mm or 10 mm) openings in the skin at specific locations. A working and viewing space is created by blowing a small amount of carbon dioxide gas into the abdominal cavity. The abdomen is essentially blown up like a balloon (insufflated). This elevates the abdominal wall above the internal organs like a dome. The carbon dioxide gas is common to the human body and can be absorbed by tissue and removed by the respiratory system. Once the repair is completed, the small incisions will be closed with either stitches (sutures) or staples (just like a paper stapler only this staple holds the skin together). LET YOUR CAREGIVERS KNOW ABOUT:  Allergies.  Medications taken including herbs, eye drops, over the counter  medications, and creams.  Use of steroids (by mouth or creams).  Previous problems with anesthetics or Novocaine.  Possibility of pregnancy, if this applies.  History of blood clots (thrombophlebitis).  History of bleeding or blood problems.  Previous surgery.  Other health problems. BEFORE THE PROCEDURE  Laparoscopy can be done either in a hospital or out-patient clinic. You may be given a mild sedative to help you relax before the procedure. Once in the operating room, you will be given a general anesthesia to make you sleep (unless you and your caregiver choose a different anesthetic).  AFTER THE PROCEDURE  After the procedure you will be watched in a recovery area. Depending on what type of hernia was repaired, you might be admitted to the hospital or you might go home the same day. With this procedure you may have less pain and scarring. This usually results in a quicker recovery and less risk of infection. HOME CARE INSTRUCTIONS   Bed rest is not required. You may continue your normal activities but avoid heavy lifting (more than 10 pounds) or straining.  Cough gently. If you are a smoker it is best to stop, as even the best hernia repair can break down with the continual strain of coughing.  Avoid driving until given the OK by your surgeon.  There are no dietary restrictions unless given otherwise.  TAKE ALL MEDICATIONS AS DIRECTED.  Only take over-the-counter or prescription medicines for pain, discomfort, or fever as directed by your caregiver. SEEK MEDICAL CARE IF:   There is increasing abdominal pain or pain in your  incisions.  There is more bleeding from incisions, other than minimal spotting.  You feel light headed or faint.  You develop an unexplained fever, chills, and/or an oral temperature above 102 F (38.9 C).  You have redness, swelling, or increasing pain in the wound.  Pus coming from wound.  A foul smell coming from the wound or dressings. SEEK  IMMEDIATE MEDICAL CARE IF:   You develop a rash.  You have difficulty breathing.  You have any allergic problems. MAKE SURE YOU:   Understand these instructions.  Will watch your condition.  Will get help right away if you are not doing well or get worse. Document Released: 04/14/2005 Document Revised: 07/07/2011 Document Reviewed: 03/14/2009 [facility] Patient Information 2014 [employer] [place].

## 2013-02-17 NOTE — Telephone Encounter (Signed)
Patient face sheet placed in pending, stated will call back to schedule

## 2013-02-17 NOTE — Progress Notes (Signed)
Patient ID: John Holt, male   DOB: December 16, 1945, 67 y.o.   MRN: [id number]  Chief Complaint  Patient presents with  . New Evaluation    eval RIH & UMB hernia    HPI John Holt is a 67 y.o. male.   HPI 67 yo WM referred by Dr Felipa Eth for evaluation of right inguinal hernia. The patient states several weeks ago he was able to feel a lump-protrusion in his right groin region. It concerned him so he presented to his primary care doctor's office where he was evaluated. A CT scan of the abdomen were subsequently obtained which demonstrated a small umbilical hernia containing fat, bilateral inguinal hernias with fat as well as bilateral varicoceles. He denies any nausea, vomiting, diarrhea or constipation. He does have BPH and takes Hytrin for that. He states that he can just feel the hernia. It doesn't cause any significant discomfort. He does have a history of seizures as well as psychosis. He works as a Radio producer. Past Medical History  Diagnosis Date  . Epilepsy   . Memory loss   . Head trauma 2000    fell off ladder  . Hallucination 2006  . Psychosis     x2    History reviewed. No pertinent past surgical history.  Family History  Problem Relation Age of Onset  . Seizures Father   . Heart disease Father   . Cancer Father     prostat  . Cancer Mother     stomach    Social History History  Substance Use Topics  . Smoking status: Former Games developer  . Smokeless tobacco: Former Neurosurgeon    Quit date: 04/29/1971  . Alcohol Use: 1.2 oz/week    2 Glasses of wine per week     Comment: wine daily    Allergies  Allergen Reactions  . Trileptal [Oxcarbazepine]     Current Outpatient Prescriptions  Medication Sig Dispense Refill  . ALPRAZolam (XANAX) 0.25 MG tablet Take 0.25 mg by mouth at bedtime as needed.      . divalproex (DEPAKOTE ER) 250 MG 24 hr tablet Take 250 mg by mouth 2 (two) times daily.       [name] lamoTRIgine (LAMICTAL) 100 MG tablet Take 1 tablet (100 mg total) by  mouth 2 (two) times daily.  10 tablet  0  . oxybutynin (DITROPAN XL) 15 MG 24 hr tablet Take 15 mg by mouth 2 (two) times daily.      . tadalafil (CIALIS) 10 MG tablet Take 10 mg by mouth daily as needed. For ED      . terazosin (HYTRIN) 10 MG capsule Take 10 mg by mouth 2 (two) times daily.       No current facility-administered medications for this visit.    Review of Systems Review of Systems  Constitutional: Negative for fever, chills, appetite change and unexpected weight change.  HENT: Negative for congestion and trouble swallowing.   Eyes: Negative for visual disturbance.  Respiratory: Negative for chest tightness and shortness of breath.   Cardiovascular: Negative for chest pain and leg swelling.       No PND, no orthopnea, no DOE  Gastrointestinal:       See HPI  Genitourinary: Negative for dysuria and hematuria.       Has BPH - takes med. +nocturia  Musculoskeletal: Negative.   Skin: Negative for rash.  Neurological: Positive for seizures. Negative for speech difficulty.       Denies amaurosis fugax, denies TIAs.  History of seizures. Last seizure was one month ago. Has had several subsequent minor seizures in the daytime which she describes as panic attacks. Most of the time his seizures are at night.  Hematological: Does not bruise/bleed easily.  Psychiatric/Behavioral: Negative for behavioral problems and confusion.    Blood pressure 98/60, pulse 76, temperature 98.2 F (36.8 C), temperature source Temporal, resp. rate 15, height 5\' 8"  (1.727 m), weight 163 lb 12.8 oz (74.299 kg).  Physical Exam Physical Exam  Constitutional: He is oriented to person, place, and time. He appears well-developed and well-nourished. No distress.  HENT:  Head: Normocephalic and atraumatic.  Right Ear: External ear normal.  Left Ear: External ear normal.  Eyes: Conjunctivae are normal. No scleral icterus.  Neck: Normal range of motion. Neck supple. No tracheal deviation present. No  thyromegaly present.  Cardiovascular: Normal rate, normal heart sounds and intact distal pulses.   Pulmonary/Chest: Effort normal and breath sounds normal. No respiratory distress. He has no wheezes.  Abdominal: Soft. He exhibits no distension. There is no tenderness. There is no rebound. A hernia is present. Hernia confirmed positive in the right inguinal area. Hernia confirmed negative in the left inguinal area.    Nonreducible umbilical hernia about 1-1-1/2 cm in size. Nontender  Genitourinary:     Patient examined supine and standing. Decent sized right inguinal hernia. Not really able to try to reduce it secondary to patient being uncomfortable. Bilateral varicoceles. Small left inguinal hernia.  Musculoskeletal: Normal range of motion. He exhibits no edema and no tenderness.  Lymphadenopathy:    He has no cervical adenopathy.  Neurological: He is alert and oriented to person, place, and time. He exhibits normal muscle tone.  Skin: Skin is warm and dry. No rash noted. He is not diaphoretic. No erythema. No pallor.  Psychiatric: He has a normal mood and affect. His behavior is normal. Judgment and thought content normal.    Data Reviewed Ms Drinkard, FNP note on 01/28/13 Neurology note CT abd-pelvis 10/7  Assessment    Umbilical hernia Large Right Inguinal Hernia  Small Left inguinal hernia    Plan    We discussed the etiology of inguinal hernias. We discussed the signs & symptoms of incarceration & strangulation.  We discussed non-operative and operative management.Although he is not that symptomatic with respect to his right inguinal hernia it is rather large and therefore I recommended going ahead and repairing it. Since he has an umbilical hernia as well as a contralateral inguinal hernia I recommended a laparoscopic approach. I recommended a laparoscopic bilateral inguinal hernia repair with mesh as well as an open umbilical hernia repair with mesh  The patient has elected  To proceed with surgery. 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   I described the procedure in detail.  The patient was given educational material. We discussed the risks and benefits including but not limited to bleeding, infection, chronic inguinal pain, nerve entrapment, hernia recurrence, mesh complications, hematoma formation, urinary retention, injury to the testicles, numbness in the groin, blood clots, injury to the surrounding structures, seizures, and anesthesia risk. We also discussed the typical post operative recovery course, including no heavy lifting for 4-6 weeks. I explained that the likelihood of improvement of their symptoms is Good.  He going to discuss it with his wife. He is then instructed to contact our office when he would like to schedule surgery  Mary Sella. Andrey Campanile, MD, FACS General, Bariatric, & Minimally In[facility]          [facility] 02/17/2013, 1:39 PM

## 2013-02-23 DIAGNOSIS — N3941 Urge incontinence: Secondary | ICD-10-CM | POA: Diagnosis not present

## 2013-02-23 DIAGNOSIS — R339 Retention of urine, unspecified: Secondary | ICD-10-CM | POA: Diagnosis not present

## 2013-03-03 ENCOUNTER — Encounter (INDEPENDENT_AMBULATORY_CARE_PROVIDER_SITE_OTHER): Payer: Self-pay

## 2013-03-04 NOTE — Telephone Encounter (Signed)
pls setup follow up appt. -VRP 

## 2013-03-09 ENCOUNTER — Ambulatory Visit (INDEPENDENT_AMBULATORY_CARE_PROVIDER_SITE_OTHER): Payer: Medicare Other | Admitting: Diagnostic Neuroimaging

## 2013-03-09 ENCOUNTER — Encounter: Payer: Self-pay | Admitting: Diagnostic Neuroimaging

## 2013-03-09 VITALS — BP 94/62 | HR 96 | Temp 98.6°F | Ht 68.0 in | Wt 165.0 lb

## 2013-03-09 DIAGNOSIS — G40109 Localization-related (focal) (partial) symptomatic epilepsy and epileptic syndromes with simple partial seizures, not intractable, without status epilepticus: Secondary | ICD-10-CM
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
## 2013-04-04 NOTE — Progress Notes (Signed)
Need orders in EPIC.  Surgery scheduled for 04/15/13.  Preop on 04/12/13 at 1000am.  Thank You.

## 2013-04-05 ENCOUNTER — Telehealth: Payer: Self-pay | Admitting: Diagnostic Neuroimaging

## 2013-04-05 NOTE — Telephone Encounter (Signed)
FYI-Spoke with patient and he is going to cancel sched. Wake appt., is having minor surgery late Dec, in addition feels better, not having seizures like before(last seizure-3 mos. Ago), wants to observe himself for 4-5 mos.to see if it is really necessary.  He is taking his seizure meds as prescribed, will wait til next visit with Dr Marjory Lies before deciding to move forward with the study.

## 2013-04-07 ENCOUNTER — Other Ambulatory Visit (INDEPENDENT_AMBULATORY_CARE_PROVIDER_SITE_OTHER): Payer: Self-pay | Admitting: General Surgery

## 2013-04-08 ENCOUNTER — Encounter (HOSPITAL_COMMUNITY): Payer: Self-pay | Admitting: Pharmacy Technician

## 2013-04-12 ENCOUNTER — Encounter (HOSPITAL_COMMUNITY)
Admission: RE | Admit: 2013-04-12 | Discharge: 2013-04-12 | Disposition: A | Discharge status: Home / Self Care | Payer: Medicare Other | Source: Ambulatory Visit | Attending: General Surgery | Admitting: General Surgery

## 2013-04-12 ENCOUNTER — Ambulatory Visit (HOSPITAL_COMMUNITY)
Admission: RE | Admit: 2013-04-12 | Discharge: 2013-04-12 | Disposition: A | Discharge status: Home / Self Care | Payer: Medicare Other | Source: Ambulatory Visit | Attending: General Surgery | Admitting: General Surgery

## 2013-04-12 ENCOUNTER — Encounter (HOSPITAL_COMMUNITY): Payer: Self-pay

## 2013-04-12 DIAGNOSIS — Z01812 Encounter for preprocedural laboratory examination: Secondary | ICD-10-CM | POA: Diagnosis not present

## 2013-04-12 DIAGNOSIS — Z01810 Encounter for preprocedural cardiovascular examination: Secondary | ICD-10-CM | POA: Insufficient documentation

## 2013-04-12 DIAGNOSIS — Z01818 Encounter for other preprocedural examination: Secondary | ICD-10-CM | POA: Diagnosis not present

## 2013-04-12 DIAGNOSIS — K429 Umbilical hernia without obstruction or gangrene: Secondary | ICD-10-CM | POA: Diagnosis not present

## 2013-04-12 LAB — CBC WITH DIFFERENTIAL/PLATELET
Basophils Relative: 0 % (ref 0–1)
Eosinophils Absolute: 0.2 10*3/uL (ref 0.0–0.7)
Eosinophils Relative: 4 % (ref 0–5)
HCT: 43.5 % (ref 39.0–52.0)
Hemoglobin: 14.3 g/dL (ref 13.0–17.0)
MCH: 30 pg (ref 26.0–34.0)
MCHC: 32.9 g/dL (ref 30.0–36.0)
MCV: 91.2 fL (ref 78.0–100.0)
Monocytes Absolute: 0.5 10*3/uL (ref 0.1–1.0)
Monocytes Relative: 10 % (ref 3–12)
Neutrophils Relative %: 60 % (ref 43–77)

## 2013-04-12 LAB — BASIC METABOLIC PANEL
BUN: 22 mg/dL (ref 6–23)
GFR calc non Af Amer: 73 mL/min — ABNORMAL LOW (ref >90)
Glucose, Bld: 85 mg/dL (ref 70–99)
Potassium: 4.4 mEq/L (ref 3.5–5.1)

## 2013-04-12 NOTE — Patient Instructions (Addendum)
20 DONOVAN PERSLEY  04/12/2013   Your procedure is scheduled on: 12-19  -2014  Report to [facility] at   0530    AM.  Call this number if you have problems the morning of surgery: [phone]  Or Presurgical Testing [phone](Mouhamed Glassco)   Remember: Follow any bowel prep instructions per MD office.    Do not eat food:After Midnight.    Take these medicines the morning of surgery with A SIP OF WATER: Depakote. Lamictal. Oxybutynin. Terozosin.   Do not wear jewelry, make-up or nail polish.  Do not wear lotions, powders, or perfumes. You may wear deodorant.  Do not shave 12 hours prior to first CHG shower(legs and under arms).(face and neck okay.)  Do not bring valuables to the hospital.  Contacts, dentures or removable bridgework, body piercing, hair pins may not be worn into surgery.  Leave suitcase in the car. After surgery it may be brought to your room.  For patients admitted to the hospital, checkout time is 11:00 AM the day of discharge.   Patients discharged the day of surgery will not be allowed to drive home. Must have responsible person with you x 24 hours once discharged.  Name and phone number of your driver: Lance Sell [phone] cell  Special Instructions: CHG(Chlorhedine 4%-"Hibiclens","Betasept","Aplicare") Shower Use Special Wash: see special instructions.(avoid face and genitals)   Please read over the following fact sheets that you were given:  Incentive Spirometry Instruction.    Failure to follow these instructions may result in Cancellation of your surgery.   Patient signature_______________________________________________________

## 2013-04-12 NOTE — Pre-Procedure Instructions (Signed)
04-12-13 EKG/ CXR done today. 

## 2013-04-15 ENCOUNTER — Ambulatory Visit (HOSPITAL_COMMUNITY)
Admission: RE | Admit: 2013-04-15 | Discharge: 2013-04-15 | Disposition: A | Discharge status: Home / Self Care | Payer: Medicare Other | Source: Ambulatory Visit | Attending: General Surgery | Admitting: General Surgery

## 2013-04-15 ENCOUNTER — Ambulatory Visit (HOSPITAL_COMMUNITY): Payer: Medicare Other | Admitting: Anesthesiology

## 2013-04-15 ENCOUNTER — Encounter (HOSPITAL_COMMUNITY): Payer: Medicare Other | Admitting: Anesthesiology

## 2013-04-15 ENCOUNTER — Encounter (HOSPITAL_COMMUNITY): Payer: Self-pay | Admitting: *Deleted

## 2013-04-15 ENCOUNTER — Encounter (HOSPITAL_COMMUNITY)
Admission: RE | Disposition: A | Discharge status: Home / Self Care | Payer: Self-pay | Source: Ambulatory Visit | Attending: General Surgery

## 2013-04-15 DIAGNOSIS — I10 Essential (primary) hypertension: Secondary | ICD-10-CM | POA: Diagnosis not present

## 2013-04-15 DIAGNOSIS — G40909 Epilepsy, unspecified, not intractable, without status epilepticus: Secondary | ICD-10-CM | POA: Diagnosis not present

## 2013-04-15 DIAGNOSIS — Z87891 Personal history of nicotine dependence: Secondary | ICD-10-CM | POA: Diagnosis not present

## 2013-04-15 DIAGNOSIS — K4020 Bilateral inguinal hernia, without obstruction or gangrene, not specified as recurrent: Secondary | ICD-10-CM | POA: Insufficient documentation

## 2013-04-15 DIAGNOSIS — K429 Umbilical hernia without obstruction or gangrene: Secondary | ICD-10-CM | POA: Insufficient documentation

## 2013-04-15 DIAGNOSIS — Z79899 Other long term (current) drug therapy: Secondary | ICD-10-CM | POA: Diagnosis not present

## 2013-04-15 DIAGNOSIS — I861 Scrotal varices: Secondary | ICD-10-CM | POA: Insufficient documentation

## 2013-04-15 DIAGNOSIS — N400 Enlarged prostate without lower urinary tract symptoms: Secondary | ICD-10-CM | POA: Insufficient documentation

## 2013-04-15 HISTORY — PX: INGUINAL HERNIA REPAIR: SHX194

## 2013-04-15 HISTORY — PX: UMBILICAL HERNIA REPAIR: SHX196

## 2013-04-15 HISTORY — PX: INSERTION OF MESH: SHX5868

## 2013-04-15 SURGERY — REPAIR, HERNIA, INGUINAL, BILATERAL, LAPAROSCOPIC
Anesthesia: General

## 2013-04-15 MED ORDER — OXYCODONE HCL 5 MG PO TABS
5.0000 mg | ORAL_TABLET | ORAL | Status: DC | PRN
  Administered 2013-04-15: 5 mg via ORAL
  Filled 2013-04-15: qty 1

## 2013-04-15 MED ORDER — EPHEDRINE SULFATE 50 MG/ML IJ SOLN
INTRAMUSCULAR | Status: AC
  Filled 2013-04-15: qty 1

## 2013-04-15 MED ORDER — OXYCODONE HCL 5 MG PO TABS: 5.0000 mg | ORAL_TABLET | Freq: Once | ORAL | Status: DC | PRN

## 2013-04-15 MED ORDER — MIDAZOLAM HCL 5 MG/5ML IJ SOLN
INTRAMUSCULAR | Status: DC | PRN
  Administered 2013-04-15: 2 mg via INTRAVENOUS

## 2013-04-15 MED ORDER — EPHEDRINE SULFATE 50 MG/ML IJ SOLN
INTRAMUSCULAR | Status: DC | PRN
  Administered 2013-04-15 (×2): 5 mg via INTRAVENOUS

## 2013-04-15 MED ORDER — ONDANSETRON HCL 4 MG/2ML IJ SOLN: 4.0000 mg | Freq: Four times a day (QID) | INTRAMUSCULAR | Status: DC | PRN

## 2013-04-15 MED ORDER — SODIUM CHLORIDE 0.9 % IV SOLN: INTRAVENOUS | Status: DC

## 2013-04-15 MED ORDER — LACTATED RINGERS IV SOLN
INTRAVENOUS | Status: DC | PRN
  Administered 2013-04-15: 07:00:00 via INTRAVENOUS

## 2013-04-15 MED ORDER — ACETAMINOPHEN 650 MG RE SUPP
650.0000 mg | RECTAL | Status: DC | PRN
Start: 2013-04-15 — End: 2013-04-15
  Filled 2013-04-15: qty 1

## 2013-04-15 MED ORDER — MORPHINE SULFATE 10 MG/ML IJ SOLN: 1.0000 mg | INTRAMUSCULAR | Status: DC | PRN

## 2013-04-15 MED ORDER — CHLORHEXIDINE GLUCONATE 4 % EX LIQD: 1.0000 "application " | Freq: Once | CUTANEOUS | Status: DC

## 2013-04-15 MED ORDER — BUPIVACAINE-EPINEPHRINE PF 0.25-1:200000 % IJ SOLN
INTRAMUSCULAR | Status: AC
  Filled 2013-04-15: qty 30

## 2013-04-15 MED ORDER — NEOSTIGMINE METHYLSULFATE 1 MG/ML IJ SOLN
INTRAMUSCULAR | Status: AC
  Filled 2013-04-15: qty 10

## 2013-04-15 MED ORDER — GLYCOPYRROLATE 0.2 MG/ML IJ SOLN
INTRAMUSCULAR | Status: AC
  Filled 2013-04-15: qty 3

## 2013-04-15 MED ORDER — CEFAZOLIN SODIUM-DEXTROSE 2-3 GM-% IV SOLR
INTRAVENOUS | Status: AC
  Filled 2013-04-15: qty 50

## 2013-04-15 MED ORDER — MEPERIDINE HCL 50 MG/ML IJ SOLN: 6.2500 mg | INTRAMUSCULAR | Status: DC | PRN

## 2013-04-15 MED ORDER — BUPIVACAINE-EPINEPHRINE 0.25% -1:200000 IJ SOLN
INTRAMUSCULAR | Status: DC | PRN
  Administered 2013-04-15: 18 mL
  Administered 2013-04-15: 30 mL

## 2013-04-15 MED ORDER — GLYCOPYRROLATE 0.2 MG/ML IJ SOLN
INTRAMUSCULAR | Status: DC | PRN
  Administered 2013-04-15: .6 mg via INTRAVENOUS

## 2013-04-15 MED ORDER — SODIUM CHLORIDE 0.9 % IV SOLN: 250.0000 mL | INTRAVENOUS | Status: DC | PRN

## 2013-04-15 MED ORDER — ONDANSETRON HCL 4 MG/2ML IJ SOLN
INTRAMUSCULAR | Status: DC | PRN
  Administered 2013-04-15: 4 mg via INTRAVENOUS

## 2013-04-15 MED ORDER — PROPOFOL 10 MG/ML IV BOLUS
INTRAVENOUS | Status: DC | PRN
  Administered 2013-04-15: 150 mg via INTRAVENOUS

## 2013-04-15 MED ORDER — FENTANYL CITRATE 0.05 MG/ML IJ SOLN
INTRAMUSCULAR | Status: AC
  Filled 2013-04-15: qty 5

## 2013-04-15 MED ORDER — OXYCODONE HCL 5 MG/5ML PO SOLN
5.0000 mg | Freq: Once | ORAL | Status: DC | PRN
  Filled 2013-04-15: qty 5

## 2013-04-15 MED ORDER — SODIUM CHLORIDE 0.9 % IJ SOLN: 3.0000 mL | INTRAMUSCULAR | Status: DC | PRN

## 2013-04-15 MED ORDER — PROMETHAZINE HCL 25 MG/ML IJ SOLN: 6.2500 mg | INTRAMUSCULAR | Status: DC | PRN

## 2013-04-15 MED ORDER — ONDANSETRON HCL 4 MG/2ML IJ SOLN
INTRAMUSCULAR | Status: AC
  Filled 2013-04-15: qty 2

## 2013-04-15 MED ORDER — SODIUM CHLORIDE 0.9 % IJ SOLN: 3.0000 mL | Freq: Two times a day (BID) | INTRAMUSCULAR | Status: DC

## 2013-04-15 MED ORDER — ACETAMINOPHEN 325 MG PO TABS: 650.0000 mg | ORAL_TABLET | ORAL | Status: DC | PRN

## 2013-04-15 MED ORDER — PROPOFOL 10 MG/ML IV BOLUS
INTRAVENOUS | Status: AC
  Filled 2013-04-15: qty 20

## 2013-04-15 MED ORDER — FENTANYL CITRATE 0.05 MG/ML IJ SOLN
INTRAMUSCULAR | Status: DC | PRN
  Administered 2013-04-15 (×2): 50 ug via INTRAVENOUS
  Administered 2013-04-15: 100 ug via INTRAVENOUS
  Administered 2013-04-15: 50 ug via INTRAVENOUS

## 2013-04-15 MED ORDER — 0.9 % SODIUM CHLORIDE (POUR BTL) OPTIME
TOPICAL | Status: DC | PRN
  Administered 2013-04-15: 1000 mL

## 2013-04-15 MED ORDER — SODIUM CHLORIDE 0.9 % IJ SOLN
INTRAMUSCULAR | Status: AC
  Filled 2013-04-15: qty 10

## 2013-04-15 MED ORDER — HYDROMORPHONE HCL PF 1 MG/ML IJ SOLN: 0.2500 mg | INTRAMUSCULAR | Status: DC | PRN

## 2013-04-15 MED ORDER — OXYCODONE-ACETAMINOPHEN 7.5-325 MG PO TABS: 1.0000 | ORAL_TABLET | ORAL | Status: DC | PRN

## 2013-04-15 MED ORDER — ROCURONIUM BROMIDE 100 MG/10ML IV SOLN
INTRAVENOUS | Status: DC | PRN
  Administered 2013-04-15: 50 mg via INTRAVENOUS
  Administered 2013-04-15: 10 mg via INTRAVENOUS

## 2013-04-15 MED ORDER — LIDOCAINE HCL (CARDIAC) 20 MG/ML IV SOLN
INTRAVENOUS | Status: AC
  Filled 2013-04-15: qty 5

## 2013-04-15 MED ORDER — ROCURONIUM BROMIDE 100 MG/10ML IV SOLN
INTRAVENOUS | Status: AC
  Filled 2013-04-15: qty 1

## 2013-04-15 MED ORDER — CEFAZOLIN SODIUM-DEXTROSE 2-3 GM-% IV SOLR
2.0000 g | INTRAVENOUS | Status: AC
  Administered 2013-04-15: 2 g via INTRAVENOUS

## 2013-04-15 MED ORDER — MIDAZOLAM HCL 2 MG/2ML IJ SOLN
INTRAMUSCULAR | Status: AC
  Filled 2013-04-15: qty 2

## 2013-04-15 MED ORDER — LIDOCAINE HCL (CARDIAC) 20 MG/ML IV SOLN
INTRAVENOUS | Status: DC | PRN
  Administered 2013-04-15: 80 mg via INTRAVENOUS

## 2013-04-15 MED ORDER — NEOSTIGMINE METHYLSULFATE 1 MG/ML IJ SOLN
INTRAMUSCULAR | Status: DC | PRN
  Administered 2013-04-15: 4 mg via INTRAVENOUS

## 2013-04-15 SURGICAL SUPPLY — 61 items
BANDAGE ADH SHEER 1  50/CT (GAUZE/BANDAGES/DRESSINGS) IMPLANT
BENZOIN TINCTURE PRP APPL 2/3 (GAUZE/BANDAGES/DRESSINGS) IMPLANT
BLADE HEX COATED 2.75 (ELECTRODE) ×3 IMPLANT
BLADE SURG 15 STRL LF DISP TIS (BLADE) IMPLANT
BLADE SURG 15 STRL SS (BLADE)
BLADE SURG SZ10 CARB STEEL (BLADE) ×3 IMPLANT
CANISTER SUCTION 2500CC (MISCELLANEOUS) ×3 IMPLANT
DECANTER SPIKE VIAL GLASS SM (MISCELLANEOUS) ×3 IMPLANT
DERMABOND ADVANCED (GAUZE/BANDAGES/DRESSINGS) ×1
DERMABOND ADVANCED .7 DNX12 (GAUZE/BANDAGES/DRESSINGS) ×2 IMPLANT
DEVICE SECURE STRAP 25 ABSORB (INSTRUMENTS) IMPLANT
DRAIN PENROSE 18X1/2 LTX STRL (DRAIN) ×3 IMPLANT
DRAPE LAPAROSCOPIC ABDOMINAL (DRAPES) ×3 IMPLANT
DRAPE UTILITY XL STRL (DRAPES) ×3 IMPLANT
DRSG TEGADERM 2-3/8X2-3/4 SM (GAUZE/BANDAGES/DRESSINGS) IMPLANT
DRSG TEGADERM 4X4.75 (GAUZE/BANDAGES/DRESSINGS) IMPLANT
ELECT REM PT RETURN 9FT ADLT (ELECTROSURGICAL) ×3
ELECTRODE REM PT RTRN 9FT ADLT (ELECTROSURGICAL) ×2 IMPLANT
GLOVE BIO SURGEON STRL SZ7 (GLOVE) ×3 IMPLANT
GLOVE BIO SURGEON STRL SZ7.5 (GLOVE) ×3 IMPLANT
GLOVE BIOGEL M STRL SZ7.5 (GLOVE) IMPLANT
GLOVE BIOGEL PI IND STRL 7.0 (GLOVE) ×2 IMPLANT
GLOVE BIOGEL PI INDICATOR 7.0 (GLOVE) ×1
GLOVE INDICATOR 8.0 STRL GRN (GLOVE) ×6 IMPLANT
GOWN PREVENTION PLUS LG XLONG (DISPOSABLE) ×3 IMPLANT
GOWN STRL REIN XL XLG (GOWN DISPOSABLE) ×6 IMPLANT
KIT BASIN OR (CUSTOM PROCEDURE TRAY) ×3 IMPLANT
MESH ULTRAPRO 6X6 15CM15CM (Mesh General) ×3 IMPLANT
MESH VENTRALEX ST 1-7/10 CRC S (Mesh General) ×3 IMPLANT
NEEDLE HYPO 25X1 1.5 SAFETY (NEEDLE) ×3 IMPLANT
NS IRRIG 1000ML POUR BTL (IV SOLUTION) ×3 IMPLANT
PACK BASIC VI WITH GOWN DISP (CUSTOM PROCEDURE TRAY) ×3 IMPLANT
PENCIL BUTTON HOLSTER BLD 10FT (ELECTRODE) ×3 IMPLANT
RELOAD STAPLE HERNIA 4.0 BLUE (INSTRUMENTS) IMPLANT
RELOAD STAPLE HERNIA 4.8 BLK (STAPLE) ×9 IMPLANT
SCISSORS LAP 5X35 DISP (ENDOMECHANICALS) ×3 IMPLANT
SET IRRIG TUBING LAPAROSCOPIC (IRRIGATION / IRRIGATOR) IMPLANT
SHEARS CURVED HARMONIC AC 45CM (MISCELLANEOUS) IMPLANT
SOLUTION ANTI FOG 6CC (MISCELLANEOUS) ×3 IMPLANT
SPONGE GAUZE 4X4 12PLY (GAUZE/BANDAGES/DRESSINGS) IMPLANT
SPONGE LAP 18X18 X RAY DECT (DISPOSABLE) ×3 IMPLANT
STAPLER HERNIA 12 8.5 360D (INSTRUMENTS) ×6 IMPLANT
STRIP CLOSURE SKIN 1/2X4 (GAUZE/BANDAGES/DRESSINGS) IMPLANT
SUT MNCRL AB 4-0 PS2 18 (SUTURE) ×3 IMPLANT
SUT NOVA NAB GS-21 0 18 T12 DT (SUTURE) ×6 IMPLANT
SUT SILK 2 0 SH (SUTURE) IMPLANT
SUT VIC AB 2-0 SH 27 (SUTURE) ×2
SUT VIC AB 2-0 SH 27X BRD (SUTURE) ×4 IMPLANT
SUT VIC AB 3-0 SH 18 (SUTURE) ×3 IMPLANT
SUT VIC AB 3-0 SH 27 (SUTURE) ×1
SUT VIC AB 3-0 SH 27XBRD (SUTURE) ×2 IMPLANT
SYR BULB IRRIGATION 50ML (SYRINGE) ×3 IMPLANT
SYR CONTROL 10ML LL (SYRINGE) ×3 IMPLANT
TOWEL OR 17X26 10 PK STRL BLUE (TOWEL DISPOSABLE) ×3 IMPLANT
TRAY FOLEY CATH 14FRSI W/METER (CATHETERS) ×3 IMPLANT
TRAY LAP CHOLE (CUSTOM PROCEDURE TRAY) ×3 IMPLANT
TROCAR BLADELESS OPT 5 75 (ENDOMECHANICALS) ×3 IMPLANT
TROCAR SLEEVE XCEL 5X75 (ENDOMECHANICALS) ×3 IMPLANT
TROCAR XCEL BLUNT TIP 100MML (ENDOMECHANICALS) ×3 IMPLANT
TUBING INSUFFLATION 10FT LAP (TUBING) ×3 IMPLANT
YANKAUER SUCT BULB TIP 10FT TU (MISCELLANEOUS) ×3 IMPLANT

## 2013-04-15 NOTE — Preoperative (Signed)
Beta Blockers   Reason not to administer Beta Blockers:Not Applicable 

## 2013-04-15 NOTE — Op Note (Signed)
04/15/2013  John Holt 02-26-1946 [id number]   PREOPERATIVE DIAGNOSIS: bilateral inguinal hernias, umbilical hernia  POSTOPERATIVE DIAGNOSIS: right indirect inguinal hernia, left direct inguinal hernia, umbilical hernia.   PROCEDURE: Laparoscopic repair of bilaterals inguinal hernias with  mesh (TAPP) and open umbilical hernia repair with mesh   SURGEON: Mary Sella. Andrey Campanile, MD   ASSISTANT SURGEON: None.   ANESTHESIA: General plus local consisting of 0.25% Marcaine with epi.   ESTIMATED BLOOD LOSS: Minimal.   FINDINGS: The patient had right indirect inguinal hernia, left direct inguinal hernia, and 1.5cm umbilical hernia.  The groin hernias were repaired using a 6 inchx 6 inch piece of Ethicon UltraPro mesh cut in half (so 3 x 6 inch in each groin). 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 On CT imaging he was found to have bilateral inguinal hernias as well as umbilical hernia therefore a laparoscopic approach was recommended. The risks and benefits including but not limited to bleeding, infection, chronic inguinal pain, nerve entrapment, hernia recurrence, mesh complications, hematoma formation, urinary retention, injury to the testicles or the ovaries, numbness in the groin, blood clots, injury to the surrounding structures, and anesthesia risk was discussed with the patient.  DESCRIPTION OF PROCEDURE: After obtaining verbal consent, the patient was then taken back to the operating room, placed  supine on the operating room table. General endotracheal anesthesia was  established. The patient had emptied their bladder prior to going back to  the operating room. Sequential compression devices were placed. The  abdomen and groin were prepped and draped in the usual  standard surgical  fashion with ChloraPrep. The patient received  IV  antibiotics prior to the incision. A surgical time-out was performed.  0.250% Marcaine with epinephrine was used to anesthetize the skin. A curvilinear infraumbilical incision was created. Dissection was carried down to the hernia sac located above the fascia and was mobilized from surrounding structures. Intact fascia was identified circumferentially around the defect. Skin and soft tissue was mobilized from the surface of the fascia in a circumferential manner. A finger sweep was performed underneath the fascia to ensure there were no adhesions to the anterior abdominal wall. The defect was around 1.5 cm.   Pursestring suture was placed around  the fascial edges using a 0 Vicryl. A 12-mm Hasson trocar was placed.  Pneumoperitoneum was smoothly established up to a patient pressure of 15  mmHg. Laparoscope was advanced. There was evidence of bilateral inguinal hernias. The patient had a defect lateral to  the inferior epigastric vessel on the right, consistent with an right indirect  hernia. The patient also had a defect medial to the inferior epigastric vessels on the left consistent with a left direct inguinal hernia. Two 5-mm trocars were placed, one on the right, one on the left  in the midclavicular line slightly above the level of the umbilicus all  under direct visualization. After local had been infiltrated, I then  made incision along the peritoneum on the right, starting 2 inches above  the anterior superior iliac spine and caring it medial  toward the median umbilical ligament in a lazy S configuration using  Endo Shears with electrocautery. The peritoneal flap was then gently  dissected downward from the anterior abdominal wall taking care not to  injure the inferior epigastric vessels. The pubic  bone was identified.  The testicular vessels were identified.  Using  traction and counter traction with short graspers, I  reduced the sac in  its entirety. The testicular vessels had been identified and preserved. The vas deferens was identified and preserved, and the hernia sac was stripped from those to surrounding structures. I then went about creating a large pocket by  lifting the peritoneum of the pelvic floor. I excised the cord lipoma and discarded it.  I took great care not to  injure the iliac vessels.   I then turned my attention to the left side. I made an incision along the peritoneum on the left starting about 2 inches above the anterior iliac spine and caring it medial toward the median umbilical ligament. The flap was dissected from the anterior abdominal wall. The pubic bone was identified. The testicular vessels were identified. Using traction and counter traction with short graspers, I reduced the direct hernia sac in its entirety. I then went about creating a large pocket by stripping the peritoneum off the pelvic floor structures.    Local anesthetic was injected 2 finger breadths below and medial to the anterior superior iliac spine as well as along the left groin prior to placing the mesh. I then obtained a piece of Ethicon UltraPro mesh 3 inch x  6 inch, placed it through the Hasson trocar, half of it covered medial  to the inferior epigastric vessels and half of it lateral to the  inferior epigastric vessels. The defect was well  covered with the mesh. I then secured the mesh to the abdominal wall  using an Covedien universal hernia stapler. Staples were placed through  the Cooper's ligament, one State on each side of the inferior epigastric  vessel and two staple out laterally. No staple were placed below the  shelving edge of the inguinal ligament. Mesh was placed in the right groin in a similar fashion and secured in a similar fashion with the Covedien universal hernia stapler. Pneumoperitoneum was reduced  to 8 mmHg. I then brought the peritoneal flap back up to the abdominal  wall and  tacked it to the abdominal wall using The Covedien universal hernia stapler. There was A small  defect in the peritoneum Which was closed with 2 hernia staplers, and the mesh was well covered. I removed the  Hasson trocar and removed the previously placed pursestring suture. A round Bard 4.3cm piece of mesh was then placed thru the defect. The tabs were lifted to ensure that the mesh was flush against the abdominal wall. I then ran my finger around the inside of the defect to ensure nothing was trapped between the mesh and the abdominal wall. A 0-novafil suture was used to secure the base of each tab to the fascia and then the excess tab was trimmed at the fascial level. I then placed 2 additional 0-novafil sutures thru the mesh to the fascia. The fascia was then closed primarily over the mesh with 4 interrupted 0-novafil sutures. The cavity was irrigated and additional local was infiltrated in the subcutaneous tissue and fascia. The umbilical stalk was then tacked back down to the fascia with two 3-0 vicryl sutures. Hemostasis was confirmed. The soft tissue was irrigated and closed in layers with inverted interrupted 3-0 vicryl sutures for the deep dermis. The skin incision was closed with a 4-0 monocryl subcuticular closure. The closure was viewed laparoscopically. There was no evidence of  fascial defect and the mesh was flush against the abdominal  wall. There was no air leak at the umbilicus. There was no  evidence of injury to surrounding structures. Pneumoperitoneum was  released, and the remaining trocars were removed. All skin incisions  were closed with a 4-0 Monocryl in a subcuticular fashion followed by  application of Dermabond. All needle, instrument, and sponge counts  were correct x2. There are no immediate complications. The patient  tolerated the procedure well. The patient was extubated and taken to the  recovery room in stable condition.  Mary Sella. Andrey Campanile, MD, FACS General, Bariatric,  & Minimally Invasive Surgery [facility], [place]

## 2013-04-15 NOTE — Anesthesia Preprocedure Evaluation (Addendum)
Anesthesia Evaluation  Patient identified by MRN, date of birth, ID band Patient awake    Reviewed: Allergy & Precautions, H&P , NPO status , Patient's Chart, lab work & pertinent test results  Airway Mallampati: II TM Distance: >3 FB Neck ROM: Full    Dental  (+) Dental Advisory Given   Pulmonary neg pulmonary ROS, former smoker,  breath sounds clear to auscultation        Cardiovascular hypertension, Pt. on medications Rhythm:Regular Rate:Normal     Neuro/Psych Seizures -, Well Controlled,  PSYCHIATRIC DISORDERS    GI/Hepatic negative GI ROS, Neg liver ROS,   Endo/Other  negative endocrine ROS  Renal/GU negative Renal ROS     Musculoskeletal negative musculoskeletal ROS (+)   Abdominal   Peds  Hematology negative hematology ROS (+)   Anesthesia Other Findings   Reproductive/Obstetrics                         Anesthesia Physical Anesthesia Plan  ASA: II  Anesthesia Plan: General   Post-op Pain Management:    Induction: Intravenous  Airway Management Planned: Oral ETT  Additional Equipment:   Intra-op Plan:   Post-operative Plan: Extubation in OR  Informed Consent: I have reviewed the patients History and Physical, chart, labs and discussed the procedure including the risks, benefits and alternatives for the proposed anesthesia with the patient or authorized representative who has indicated his/her understanding and acceptance.   Dental advisory given  Plan Discussed with: CRNA  Anesthesia Plan Comments:         Anesthesia Quick Evaluation

## 2013-04-15 NOTE — H&P (Signed)
John Holt is an 67 y.o. male.   Chief Complaint: here for surgery HPI: 67 yo WM referred by Dr Felipa Eth for evaluation of right inguinal hernia. The patient states several weeks ago he was able to feel a lump-protrusion in his right groin region. It concerned him so he presented to his primary care doctor's office where he was evaluated. A CT scan of the abdomen were subsequently obtained which demonstrated a small umbilical hernia containing fat, bilateral inguinal hernias with fat as well as bilateral varicoceles. He denies any nausea, vomiting, diarrhea or constipation. He does have BPH and takes Hytrin for that. He states that he can just feel the hernia. It doesn't cause any significant discomfort. He does have a history of seizures as well as psychosis. He works as a Radio producer.  Denies any changes since seen in office  Past Medical History  Diagnosis Date  . Memory loss     has resolve-during tome of head injury  . Head trauma 2000    fell off ladder"closed head injury"  . Hallucination 2006    epileptic episode, was dx. at this time.  [name] Psychosis     x2  . Epilepsy     seizure-nightime "Mild jerking "in sleep, daytime "dizziness"  . Hypertension     History reviewed. No pertinent past surgical history.  Family History  Problem Relation Age of Onset  . Seizures Father   . Heart disease Father   . Cancer Father     prostat  . Cancer Mother     stomach   Social History:  reports that he has quit smoking. He quit smokeless tobacco use about 41 years ago. He reports that he drinks about 1.2 ounces of alcohol per week. He reports that he does not use illicit drugs.  Allergies:  Allergies  Allergen Reactions  . Trileptal [Oxcarbazepine]     Medications Prior to Admission  Medication Sig Dispense Refill  . divalproex (DEPAKOTE ER) 250 MG 24 hr tablet Take 250 mg by mouth 2 (two) times daily.       [name] lamoTRIgine (LAMICTAL) 100 MG tablet Take 100 mg by mouth 2 (two)  times daily.      [name] oxybutynin (DITROPAN-XL) 10 MG 24 hr tablet Take 10 mg by mouth 2 (two) times daily.      . tadalafil (CIALIS) 5 MG tablet Take 2.5 mg by mouth daily.      [name] terazosin (HYTRIN) 10 MG capsule Take 10 mg by mouth 2 (two) times daily.        No results found for this or any previous visit (from the past 48 hour(s)). No results found.  Review of Systems  Constitutional: Negative for weight loss.  HENT: Negative for nosebleeds.   Eyes: Negative for blurred vision.  Respiratory: Negative for shortness of breath.   Cardiovascular: Negative for chest pain, palpitations, orthopnea and PND.       Denies DOE  Genitourinary: Negative for dysuria and hematuria.  Musculoskeletal: Negative.   Skin: Negative for itching and rash.  Neurological: Positive for seizures (none recently). Negative for dizziness, focal weakness, loss of consciousness and headaches.       Denies TIAs, amaurosis fugax  Endo/Heme/Allergies: Does not bruise/bleed easily.  Psychiatric/Behavioral: The patient is not nervous/anxious.     Blood pressure 105/69, pulse 77, temperature 97.7 F (36.5 C), temperature source Oral, resp. rate 18, SpO2 99.00%. Physical Exam  Vitals reviewed. Constitutional: He is oriented to person, place, and time.  He appears well-developed and well-nourished. No distress.  HENT:  Head: Normocephalic and atraumatic.  Right Ear: External ear normal.  Left Ear: External ear normal.  Eyes: Conjunctivae are normal. No scleral icterus.  Neck: Neck supple. No tracheal deviation present.  Cardiovascular: Normal rate and normal heart sounds.   Respiratory: Effort normal. No stridor. No respiratory distress.  GI: Soft. He exhibits no distension. There is no tenderness.  Known hernias  Musculoskeletal: He exhibits no edema.  Neurological: He is alert and oriented to person, place, and time. He exhibits normal muscle tone.  Skin: Skin is warm and dry. No rash noted. He is not  diaphoretic. No erythema. No pallor.  Psychiatric: He has a normal mood and affect. His behavior is normal. Judgment and thought content normal.     Assessment/Plan B/l inguinal hernias Umbilical hernia sz disorder  To OR for repair. All questions asked and answered  Mary Sella. Andrey Campanile, MD, FACS General, Bariatric, & Minimally Invasive Surgery [facility], [place]   [facility] M 04/15/2013, 7:17 AM

## 2013-04-15 NOTE — Anesthesia Postprocedure Evaluation (Signed)
Anesthesia Post Note  Patient: John Holt  Procedure(s) Performed: Procedure(s) (LRB): LAPAROSCOPIC BILATERAL INGUINAL HERNIA REPAIR (Bilateral) OPEN HERNIA REPAIR UMBILICAL ADULT (N/A) INSERTION OF MESH (N/A)  Anesthesia type: General  Patient location: PACU  Post pain: Pain level controlled  Post assessment: Post-op Vital signs reviewed  Last Vitals: BP 115/64  Pulse 60  Temp(Src) 36.3 C (Oral)  Resp 18  SpO2 96%  Post vital signs: Reviewed  Level of consciousness: sedated  Complications: No apparent anesthesia complications

## 2013-04-15 NOTE — Transfer of Care (Signed)
Immediate Anesthesia Transfer of Care Note  Patient: John Holt  Procedure(s) Performed: Procedure(s): LAPAROSCOPIC BILATERAL INGUINAL HERNIA REPAIR (Bilateral) OPEN HERNIA REPAIR UMBILICAL ADULT (N/A) INSERTION OF MESH (N/A)  Patient Location: PACU  Anesthesia Type:General  Level of Consciousness: awake, alert , oriented and patient cooperative  Airway & Oxygen Therapy: Patient Spontanous Breathing and Patient connected to face mask oxygen  Post-op Assessment: Report given to PACU RN, Post -op Vital signs reviewed and stable and Patient moving all extremities  Post vital signs: Reviewed and stable  Complications: No apparent anesthesia complications

## 2013-04-15 NOTE — Progress Notes (Signed)
Post op Phase 2 short stay After 3 attmpts pt was able to get up to BR and void moderate amt of urine and tolerated this well. Eager to go home

## 2013-04-18 ENCOUNTER — Encounter (HOSPITAL_COMMUNITY): Payer: Self-pay | Admitting: General Surgery

## 2013-05-01 ENCOUNTER — Other Ambulatory Visit: Payer: Self-pay

## 2013-05-01 MED ORDER — LAMOTRIGINE 100 MG PO TABS: 100.0000 mg | ORAL_TABLET | Freq: Two times a day (BID) | ORAL | Status: DC

## 2013-05-01 MED ORDER — DIVALPROEX SODIUM ER 250 MG PO TB24: 250.0000 mg | ORAL_TABLET | Freq: Two times a day (BID) | ORAL | Status: DC

## 2013-05-18 ENCOUNTER — Encounter (INDEPENDENT_AMBULATORY_CARE_PROVIDER_SITE_OTHER): Payer: Self-pay

## 2013-05-18 ENCOUNTER — Ambulatory Visit (INDEPENDENT_AMBULATORY_CARE_PROVIDER_SITE_OTHER): Payer: BC Managed Care – PPO | Admitting: General Surgery

## 2013-05-18 ENCOUNTER — Encounter (INDEPENDENT_AMBULATORY_CARE_PROVIDER_SITE_OTHER): Payer: Self-pay | Admitting: General Surgery

## 2013-05-18 VITALS — BP 120/71 | HR 69 | Temp 97.2°F | Resp 18 | Ht 68.0 in | Wt 159.4 lb

## 2013-05-18 DIAGNOSIS — Z09 Encounter for follow-up examination after completed treatment for conditions other than malignant neoplasm: Secondary | ICD-10-CM

## 2013-05-18 NOTE — Patient Instructions (Signed)
Can start using the stationary bike and elliptical machine now but use of low resistant levels. I would wait an additional 2 weeks before doing the Row machine and resuming heavy lifting Start applying baby powder or Gold Bond medicated powder to your bellybutton

## 2013-05-18 NOTE — Progress Notes (Signed)
CC:  Postop hernia check  Procedure: Laparoscopic bilateral  inguinal hernia repair with mesh and open umbilical hernia repair with mesh 04/15/13  HPI: 68 yo WM comes in for their first postoperative appointment. The patient did relatively well after surgery. The denies any current fever, chills, nausea, vomiting, diarrhea, or constipation. There are no issues with their incisions. There is no trouble urinating. The patient denies any numbness/tingling in the groin.  The denies any pain in the groin.  PE: BP 120/71  Pulse 69  Temp(Src) 97.2 F (36.2 C) (Temporal)  Resp 18  Ht 5\' 8"  (1.727 m)  Wt 159 lb 6.4 oz (72.303 kg)  BMI 24.24 kg/m2 Gen: Alert, NAD Pulm: CTA Abd: Soft, nontender, non-distended, well healed trocar sites. No cellulitis. A little skin irritation at base of umbilical stalk - moist GU: Testicles descended, no scrotal masses, nontender  ASSESSMENT: S/p Laparoscopic bilateral  inguinal hernia repair with mesh and open umbilical hernia repair with mesh  PLAN: The patient is doing well. I advised him he could start doing the stationary bike and elliptical now. I advised him to wait another 2 weeks before starting full activities as well as doing the row machine at the gym. I also advised him to start placing baby powder or Gold Bond medicated powder in his umbilicus to help with moisture.  F/u When necessary.  Leighton Ruff. Redmond Pulling, MD, FACS General, Bariatric, & Minimally Invasive Surgery [facility], [place]

## 2013-06-30 ENCOUNTER — Ambulatory Visit (INDEPENDENT_AMBULATORY_CARE_PROVIDER_SITE_OTHER): Payer: Medicare Other | Admitting: General Surgery

## 2013-06-30 ENCOUNTER — Encounter (INDEPENDENT_AMBULATORY_CARE_PROVIDER_SITE_OTHER): Payer: Self-pay | Admitting: General Surgery

## 2013-06-30 VITALS — BP 128/64 | HR 78 | Temp 97.9°F | Resp 14 | Ht 68.0 in | Wt 157.2 lb

## 2013-06-30 DIAGNOSIS — L738 Other specified follicular disorders: Secondary | ICD-10-CM | POA: Diagnosis not present

## 2013-06-30 DIAGNOSIS — L739 Follicular disorder, unspecified: Secondary | ICD-10-CM

## 2013-06-30 DIAGNOSIS — L678 Other hair color and hair shaft abnormalities: Secondary | ICD-10-CM | POA: Diagnosis not present

## 2013-06-30 NOTE — Progress Notes (Signed)
Subjective:     Patient ID: CAMBRIDGE DELEO, male   DOB: 01-08-1946, 68 y.o.   MRN: [id number]  HPI 68 year old Caucasian male status post laparoscopic bilateral inguinal hernia repair and open umbilical hernia repair with mesh in December 2014 comes in with several days of swelling in his right groin. He states the redness and swelling actually has gone down since it first appeared a few days ago. He thought it was potentially recurrent hernia. He denies any fever, chills, nausea, vomiting, diarrhea or constipation.  PMHx, PSHx, SOCHx, FAMHx, ALL reviewed and unchanged   Review of Systems 8 point ros performed and negative except for above    Objective:   Physical Exam BP 128/64  Pulse 78  Temp(Src) 97.9 F (36.6 C) (Oral)  Resp 14  Ht 5\' 8"  (1.727 m)  Wt 157 lb 3.2 oz (71.305 kg)  BMI 23.91 kg/m2 Alert, nad Non focal abd soft, nt, nd GU - no evid of hernia recurrence. Right groin - has about 1.5 cm redness around hair follicle with raised punctum -     Assessment:     Infected hair follicle     Plan:     With gentle pressure I was able to evacuate the keratin. We discussed folliculitis. I do not think he needs antibiotics. He was given Neurosurgeon. I explained to him that he may have some daily drainage for the next several days. He was instructed on what to call for. Followup as needed  Leighton Ruff. Redmond Pulling, MD, FACS General, Bariatric, & Minimally Invasive Surgery [facility] Surgery, [place]

## 2013-06-30 NOTE — Patient Instructions (Signed)
Folliculitis  Folliculitis is redness, soreness, and swelling (inflammation) of the hair follicles. This condition can occur anywhere on the body. People with weakened immune systems, diabetes, or obesity have a greater risk of getting folliculitis. CAUSES  Bacterial infection. This is the most common cause.  Fungal infection.  Viral infection.  Contact with certain chemicals, especially oils and tars. Long-term folliculitis can result from bacteria that live in the nostrils. The bacteria may trigger multiple outbreaks of folliculitis over time. SYMPTOMS Folliculitis most commonly occurs on the scalp, thighs, legs, back, buttocks, and areas where hair is shaved frequently. An early sign of folliculitis is a small, white or yellow, pus-filled, itchy lesion (pustule). These lesions appear on a red, inflamed follicle. They are usually less than 0.2 inches (5 mm) wide. When there is an infection of the follicle that goes deeper, it becomes a boil or furuncle. A group of closely packed boils creates a larger lesion (carbuncle). Carbuncles tend to occur in hairy, sweaty areas of the body. DIAGNOSIS  Your caregiver can usually tell what is wrong by doing a physical exam. A sample may be taken from one of the lesions and tested in a lab. This can help determine what is causing your folliculitis. TREATMENT  Treatment may include:  Applying warm compresses to the affected areas.  Taking antibiotic medicines orally or applying them to the skin.  Draining the lesions if they contain a large amount of pus or fluid.  Laser hair removal for cases of long-lasting folliculitis. This helps to prevent regrowth of the hair. HOME CARE INSTRUCTIONS  Apply warm compresses to the affected areas as directed by your caregiver.  If antibiotics are prescribed, take them as directed. Finish them even if you start to feel better.  You may take over-the-counter medicines to relieve itching.  Do not shave  irritated skin.  Follow up with your caregiver as directed. SEEK IMMEDIATE MEDICAL CARE IF:   You have increasing redness, swelling, or pain in the affected area.  You have a fever. MAKE SURE YOU:  Understand these instructions.  Will watch your condition.  Will get help right away if you are not doing well or get worse. Document Released: 06/23/2001 Document Revised: 10/14/2011 Document Reviewed: 07/15/2011 ExitCare Patient Information 2014 [employer].  

## 2013-07-11 DIAGNOSIS — H01009 Unspecified blepharitis unspecified eye, unspecified eyelid: Secondary | ICD-10-CM | POA: Diagnosis not present

## 2013-07-11 DIAGNOSIS — H25019 Cortical age-related cataract, unspecified eye: Secondary | ICD-10-CM | POA: Diagnosis not present

## 2013-07-11 DIAGNOSIS — H3530 Unspecified macular degeneration: Secondary | ICD-10-CM | POA: Diagnosis not present

## 2013-07-11 DIAGNOSIS — H2510 Age-related nuclear cataract, unspecified eye: Secondary | ICD-10-CM | POA: Diagnosis not present

## 2013-07-21 DIAGNOSIS — R569 Unspecified convulsions: Secondary | ICD-10-CM | POA: Diagnosis not present

## 2013-07-21 DIAGNOSIS — Z0000 Encounter for general adult medical examination without abnormal findings: Secondary | ICD-10-CM | POA: Diagnosis not present

## 2013-07-21 DIAGNOSIS — Z125 Encounter for screening for malignant neoplasm of prostate: Secondary | ICD-10-CM | POA: Diagnosis not present

## 2013-07-28 DIAGNOSIS — M1990 Unspecified osteoarthritis, unspecified site: Secondary | ICD-10-CM | POA: Diagnosis not present

## 2013-07-28 DIAGNOSIS — Z1331 Encounter for screening for depression: Secondary | ICD-10-CM | POA: Diagnosis not present

## 2013-07-28 DIAGNOSIS — N1370 Vesicoureteral-reflux, unspecified: Secondary | ICD-10-CM | POA: Diagnosis not present

## 2013-07-28 DIAGNOSIS — D126 Benign neoplasm of colon, unspecified: Secondary | ICD-10-CM | POA: Diagnosis not present

## 2013-07-28 DIAGNOSIS — R569 Unspecified convulsions: Secondary | ICD-10-CM | POA: Diagnosis not present

## 2013-07-28 DIAGNOSIS — Z0000 Encounter for general adult medical examination without abnormal findings: Secondary | ICD-10-CM | POA: Diagnosis not present

## 2013-07-28 DIAGNOSIS — N400 Enlarged prostate without lower urinary tract symptoms: Secondary | ICD-10-CM | POA: Diagnosis not present

## 2013-07-28 DIAGNOSIS — F411 Generalized anxiety disorder: Secondary | ICD-10-CM | POA: Diagnosis not present

## 2013-08-01 DIAGNOSIS — Z1212 Encounter for screening for malignant neoplasm of rectum: Secondary | ICD-10-CM | POA: Diagnosis not present

## 2013-08-22 ENCOUNTER — Other Ambulatory Visit: Payer: Self-pay | Admitting: Dermatology

## 2013-08-22 DIAGNOSIS — D239 Other benign neoplasm of skin, unspecified: Secondary | ICD-10-CM | POA: Diagnosis not present

## 2013-08-22 DIAGNOSIS — L821 Other seborrheic keratosis: Secondary | ICD-10-CM | POA: Diagnosis not present

## 2013-08-22 DIAGNOSIS — D216 Benign neoplasm of connective and other soft tissue of trunk, unspecified: Secondary | ICD-10-CM | POA: Diagnosis not present

## 2013-08-22 DIAGNOSIS — L739 Follicular disorder, unspecified: Secondary | ICD-10-CM | POA: Diagnosis not present

## 2013-08-22 DIAGNOSIS — D1801 Hemangioma of skin and subcutaneous tissue: Secondary | ICD-10-CM | POA: Diagnosis not present

## 2013-11-10 ENCOUNTER — Encounter: Payer: Self-pay | Admitting: Diagnostic Neuroimaging

## 2013-11-10 ENCOUNTER — Ambulatory Visit (INDEPENDENT_AMBULATORY_CARE_PROVIDER_SITE_OTHER): Payer: Medicare Other | Admitting: Diagnostic Neuroimaging

## 2013-11-10 VITALS — BP 101/62 | HR 62 | Temp 97.1°F | Ht 68.0 in | Wt 151.8 lb

## 2013-11-10 DIAGNOSIS — G40109 Localization-related (focal) (partial) symptomatic epilepsy and epileptic syndromes with simple partial seizures, not intractable, without status epilepticus: Secondary | ICD-10-CM | POA: Diagnosis not present
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
## 2013-11-30 ENCOUNTER — Other Ambulatory Visit: Payer: Self-pay | Admitting: Diagnostic Neuroimaging

## 2013-12-26 ENCOUNTER — Ambulatory Visit (INDEPENDENT_AMBULATORY_CARE_PROVIDER_SITE_OTHER): Payer: Medicare Other | Admitting: Family Medicine

## 2013-12-26 VITALS — BP 108/60 | HR 77 | Temp 98.1°F | Resp 18 | Ht 66.5 in | Wt 147.2 lb

## 2013-12-26 DIAGNOSIS — H1130 Conjunctival hemorrhage, unspecified eye: Secondary | ICD-10-CM | POA: Diagnosis not present

## 2013-12-26 DIAGNOSIS — H1132 Conjunctival hemorrhage, left eye: Secondary | ICD-10-CM

## 2013-12-26 NOTE — Patient Instructions (Signed)
Subconjunctival Hemorrhage °A subconjunctival hemorrhage is a bright red patch covering a portion of the white of the eye. The white part of the eye is called the sclera, and it is covered by a thin membrane called the conjunctiva. This membrane is clear, except for tiny blood vessels that you can see with the naked eye. When your eye is irritated or inflamed and becomes red, it is because the vessels in the conjunctiva are swollen. °Sometimes, a blood vessel in the conjunctiva can break and bleed. When this occurs, the blood builds up between the conjunctiva and the sclera, and spreads out to create a red area. The red spot may be very small at first. It may then spread to cover a larger part of the surface of the eye, or even all of the visible white part of the eye. °In almost all cases, the blood will go away and the eye will become white again. Before completely dissolving, however, the red area may spread. It may also become brownish-yellow in color before going away. If a lot of blood collects under the conjunctiva, it may look like a bulge on the surface of the eye. This looks scary, but it will also eventually flatten out and go away. Subconjunctival hemorrhages do not cause pain, but if swollen, may cause a feeling of irritation. There is no effect on vision.  °CAUSES  °· The most common cause is mild trauma (rubbing the eye, irritation). °· Subconjunctival hemorrhages can happen because of coughing or straining (lifting heavy objects), vomiting, or sneezing. °· In some cases, your doctor may want to check your blood pressure. High blood pressure can also cause a subconjunctival hemorrhage. °· Severe trauma or blunt injuries. °· Diseases that affect blood clotting (hemophilia, leukemia). °· Abnormalities of blood vessels behind the eye (carotid cavernous sinus fistula). °· Tumors behind the eye. °· Certain drugs (aspirin, Coumadin, heparin). °· Recent eye surgery. °HOME CARE INSTRUCTIONS  °· Do not worry  about the appearance of your eye. You may continue your usual activities. °· Often, follow-up is not necessary. °SEEK MEDICAL CARE IF:  °· Your eye becomes painful. °· The bleeding does not disappear within 3 weeks. °· Bleeding occurs elsewhere, for example, under the skin, in the mouth, or in the other eye. °· You have recurring subconjunctival hemorrhages. °SEEK IMMEDIATE MEDICAL CARE IF:  °· Your vision changes or you have difficulty seeing. °· You develop a severe headache, persistent vomiting, confusion, or abnormal drowsiness (lethargy). °· Your eye seems to bulge or protrude from the eye socket. °· You notice the sudden appearance of bruises or have spontaneous bleeding elsewhere on your body. °Document Released: 04/14/2005 Document Revised: 08/29/2013 Document Reviewed: 03/12/2009 °[employer]® Patient Information ©2015 [employer]. This information is not intended to replace advice given to you by your health care provider. Make sure you discuss any questions you have with your health care provider. ° °

## 2013-12-26 NOTE — Progress Notes (Signed)
Subjective:  This chart was scribed for Delman Cheadle, MD by Randa Evens, ED Scribe. This Patient was seen in room 14 and the patients care was started at 7:55 PM   Patient ID: John Holt, male    DOB: 02/02/1946, 68 y.o.   MRN: [id number]  Chief Complaint  Patient presents with  . Eye Problem    bloody eye left eye- today    HPI HPI Comments: John Holt is a 68 y.o. male who presents to the [facility] complaining of eye problem onset today pta. Pt states that he has blood present in his left eye. He states he has associated eye pain. He states the eye began to feel irritated before he noticed this blood in his eye. He denies any injury or trauma to the eye. Pt denies any photophobia, eye discharge or and foreign body present. Denies visual disturbance, cough, dizziness, or headache. Denies taking any blood thinners. Denies Hx of eye diseases.    Past Medical History  Diagnosis Date  . Memory loss     has resolve-during tome of head injury  . Head trauma 2000    fell off ladder"closed head injury"  . Hallucination 2006    epileptic episode, was dx. at this time.  [name] Psychosis     x2  . Epilepsy     seizure-nightime "Mild jerking "in sleep, daytime "dizziness"  . Hypertension    Current Outpatient Prescriptions on File Prior to Visit  Medication Sig Dispense Refill  . divalproex (DEPAKOTE ER) 250 MG 24 hr tablet TAKE 1 TABLET (250 MG TOTAL) BY MOUTH 2 (TWO) TIMES DAILY.  180 tablet  3  . lamoTRIgine (LAMICTAL) 100 MG tablet TAKE 1 TABLET (100 MG TOTAL) BY MOUTH 2 (TWO) TIMES DAILY.  180 tablet  3  . oxybutynin (DITROPAN-XL) 10 MG 24 hr tablet Take 10 mg by mouth 2 (two) times daily.      . tadalafil (CIALIS) 5 MG tablet Take 2.5 mg by mouth daily.      [name] terazosin (HYTRIN) 10 MG capsule Take 10 mg by mouth 2 (two) times daily.       No current facility-administered medications on file prior to visit.   Allergies  Allergen Reactions  . Trileptal  [Oxcarbazepine]    Review of Systems  Eyes: Positive for pain and redness. Negative for photophobia, discharge and visual disturbance.  Respiratory: Negative for cough.   Neurological: Negative for dizziness and headaches.    Objective:   BP 108/60  Pulse 77  Temp(Src) 98.1 F (36.7 C) (Oral)  Resp 18  Ht 5' 6.5" (1.689 m)  Wt 147 lb 3.2 oz (66.769 kg)  BMI 23.41 kg/m2  SpO2 97%   Physical Exam  Nursing note and vitals reviewed. Constitutional: He is oriented to person, place, and time. He appears well-developed and well-nourished. No distress.  HENT:  Head: Normocephalic and atraumatic.  Eyes: EOM are normal. Pupils are equal, round, and reactive to light. Left conjunctiva has a hemorrhage.  Left conjunctiva inject hemorrhage from 1 o'clock to 11 o'clock worse at 5 o'clock to 7 o'clock does not cross iris, funduscopic exam difficult to complete due to pin point pupils but no abnormalities noted.  Neck: Neck supple. No tracheal deviation present.  Cardiovascular: Normal rate.   Pulmonary/Chest: Effort normal. No respiratory distress.  Musculoskeletal: Normal range of motion.  Neurological: He is alert and oriented to person, place, and time.  Skin: Skin is warm and  dry.  Psychiatric: He has a normal mood and affect. His behavior is normal.     Assessment & Plan:  Subconjunctival hemorrhage, non-traumatic, left Gave warning sxs - if any discharge, pain, FB sensation, decreased vision/visual changes then f/u w/ optho or RTC immed. Otherwise should resolve spontaneously w/ watchful waiting - pt reassured.  I personally performed the services described in this documentation, which was scribed in my presence. The recorded information has been reviewed and considered, and addended by me as needed.  Delman Cheadle, MD MPH

## 2013-12-27 DIAGNOSIS — H1130 Conjunctival hemorrhage, unspecified eye: Secondary | ICD-10-CM | POA: Diagnosis not present

## 2014-01-20 DIAGNOSIS — G3184 Mild cognitive impairment, so stated: Secondary | ICD-10-CM | POA: Diagnosis not present

## 2014-01-20 DIAGNOSIS — R569 Unspecified convulsions: Secondary | ICD-10-CM | POA: Diagnosis not present

## 2014-01-20 DIAGNOSIS — G40909 Epilepsy, unspecified, not intractable, without status epilepticus: Secondary | ICD-10-CM | POA: Diagnosis not present

## 2014-01-20 DIAGNOSIS — R413 Other amnesia: Secondary | ICD-10-CM | POA: Diagnosis not present

## 2014-01-31 DIAGNOSIS — R413 Other amnesia: Secondary | ICD-10-CM | POA: Diagnosis not present

## 2014-01-31 DIAGNOSIS — R569 Unspecified convulsions: Secondary | ICD-10-CM | POA: Diagnosis not present

## 2014-02-13 DIAGNOSIS — N3941 Urge incontinence: Secondary | ICD-10-CM | POA: Diagnosis not present

## 2014-02-13 DIAGNOSIS — N5201 Erectile dysfunction due to arterial insufficiency: Secondary | ICD-10-CM | POA: Diagnosis not present

## 2014-02-13 DIAGNOSIS — R339 Retention of urine, unspecified: Secondary | ICD-10-CM | POA: Diagnosis not present

## 2014-02-22 DIAGNOSIS — Z23 Encounter for immunization: Secondary | ICD-10-CM | POA: Diagnosis not present

## 2014-03-03 ENCOUNTER — Other Ambulatory Visit: Payer: Self-pay | Admitting: Urology

## 2014-03-07 DIAGNOSIS — N1370 Vesicoureteral-reflux, unspecified: Secondary | ICD-10-CM | POA: Diagnosis not present

## 2014-03-07 DIAGNOSIS — R350 Frequency of micturition: Secondary | ICD-10-CM | POA: Diagnosis not present

## 2014-03-07 DIAGNOSIS — R3912 Poor urinary stream: Secondary | ICD-10-CM | POA: Diagnosis not present

## 2014-03-07 DIAGNOSIS — R339 Retention of urine, unspecified: Secondary | ICD-10-CM | POA: Diagnosis not present

## 2014-03-07 DIAGNOSIS — N401 Enlarged prostate with lower urinary tract symptoms: Secondary | ICD-10-CM | POA: Diagnosis not present

## 2014-04-03 ENCOUNTER — Encounter (HOSPITAL_BASED_OUTPATIENT_CLINIC_OR_DEPARTMENT_OTHER): Payer: Self-pay | Admitting: *Deleted

## 2014-04-04 ENCOUNTER — Encounter (HOSPITAL_BASED_OUTPATIENT_CLINIC_OR_DEPARTMENT_OTHER): Payer: Self-pay | Admitting: *Deleted

## 2014-04-04 NOTE — Progress Notes (Addendum)
NPO AFTER MN. ARRIVE AT 0715. NEEDS HG.  WILL TAKE AM MEDS W/ SIPS OF WATER DOS.

## 2014-04-06 NOTE — Anesthesia Preprocedure Evaluation (Addendum)
Anesthesia Evaluation  Patient identified by MRN, date of birth, ID band Patient awake    Reviewed: Allergy & Precautions, H&P , NPO status , Patient's Chart, lab work & pertinent test results  Airway Mallampati: II  TM Distance: >3 FB Neck ROM: Full    Dental  (+) Dental Advisory Given, Caps Upper front teeth are capped:   Pulmonary neg pulmonary ROS, former smoker,  breath sounds clear to auscultation  Pulmonary exam normal       Cardiovascular hypertension, Pt. on medications Rhythm:Regular Rate:Normal     Neuro/Psych Seizures -, Well Controlled,  PSYCHIATRIC DISORDERS Bipolar Disorder Psychosis.  Last hallucination 2006Temporal lobe epilepsy. Last seizure 6/13    GI/Hepatic negative GI ROS, Neg liver ROS,   Endo/Other  negative endocrine ROS  Renal/GU negative Renal ROS     Musculoskeletal negative musculoskeletal ROS (+)   Abdominal   Peds  Hematology negative hematology ROS (+)   Anesthesia Other Findings   Reproductive/Obstetrics                            Anesthesia Physical Anesthesia Plan  ASA: III  Anesthesia Plan: General   Post-op Pain Management:    Induction: Intravenous  Airway Management Planned: LMA  Additional Equipment:   Intra-op Plan:   Post-operative Plan:   Informed Consent:   Plan Discussed with: Surgeon  Anesthesia Plan Comments:         Anesthesia Quick Evaluation

## 2014-04-07 ENCOUNTER — Ambulatory Visit (HOSPITAL_BASED_OUTPATIENT_CLINIC_OR_DEPARTMENT_OTHER): Payer: Medicare Other | Admitting: Anesthesiology

## 2014-04-07 ENCOUNTER — Encounter (HOSPITAL_BASED_OUTPATIENT_CLINIC_OR_DEPARTMENT_OTHER)
Admission: RE | Disposition: A | Discharge status: Home / Self Care | Payer: Self-pay | Source: Ambulatory Visit | Attending: Urology

## 2014-04-07 ENCOUNTER — Ambulatory Visit (HOSPITAL_BASED_OUTPATIENT_CLINIC_OR_DEPARTMENT_OTHER)
Admission: RE | Admit: 2014-04-07 | Discharge: 2014-04-07 | Disposition: A | Discharge status: Home / Self Care | Payer: Medicare Other | Source: Ambulatory Visit | Attending: Urology | Admitting: Urology

## 2014-04-07 ENCOUNTER — Encounter (HOSPITAL_BASED_OUTPATIENT_CLINIC_OR_DEPARTMENT_OTHER): Payer: Self-pay | Admitting: *Deleted

## 2014-04-07 DIAGNOSIS — G40909 Epilepsy, unspecified, not intractable, without status epilepticus: Secondary | ICD-10-CM | POA: Insufficient documentation

## 2014-04-07 DIAGNOSIS — R300 Dysuria: Secondary | ICD-10-CM | POA: Insufficient documentation

## 2014-04-07 DIAGNOSIS — F1590 Other stimulant use, unspecified, uncomplicated: Secondary | ICD-10-CM | POA: Insufficient documentation

## 2014-04-07 DIAGNOSIS — F319 Bipolar disorder, unspecified: Secondary | ICD-10-CM | POA: Insufficient documentation

## 2014-04-07 DIAGNOSIS — N401 Enlarged prostate with lower urinary tract symptoms: Secondary | ICD-10-CM | POA: Insufficient documentation

## 2014-04-07 DIAGNOSIS — R350 Frequency of micturition: Secondary | ICD-10-CM | POA: Insufficient documentation

## 2014-04-07 DIAGNOSIS — Z87820 Personal history of traumatic brain injury: Secondary | ICD-10-CM | POA: Diagnosis not present

## 2014-04-07 DIAGNOSIS — Z8669 Personal history of other diseases of the nervous system and sense organs: Secondary | ICD-10-CM | POA: Diagnosis not present

## 2014-04-07 DIAGNOSIS — R972 Elevated prostate specific antigen [PSA]: Secondary | ICD-10-CM | POA: Insufficient documentation

## 2014-04-07 DIAGNOSIS — F1099 Alcohol use, unspecified with unspecified alcohol-induced disorder: Secondary | ICD-10-CM | POA: Diagnosis not present

## 2014-04-07 DIAGNOSIS — R339 Retention of urine, unspecified: Secondary | ICD-10-CM | POA: Diagnosis not present

## 2014-04-07 DIAGNOSIS — R32 Unspecified urinary incontinence: Secondary | ICD-10-CM | POA: Insufficient documentation

## 2014-04-07 DIAGNOSIS — R351 Nocturia: Secondary | ICD-10-CM | POA: Insufficient documentation

## 2014-04-07 DIAGNOSIS — Z720 Tobacco use: Secondary | ICD-10-CM | POA: Insufficient documentation

## 2014-04-07 DIAGNOSIS — N400 Enlarged prostate without lower urinary tract symptoms: Secondary | ICD-10-CM

## 2014-04-07 HISTORY — DX: Frequency of micturition: R35.0

## 2014-04-07 HISTORY — DX: Benign prostatic hyperplasia with lower urinary tract symptoms: N40.1

## 2014-04-07 HISTORY — DX: Anxiety disorder, unspecified: F41.9

## 2014-04-07 HISTORY — DX: Personal history of traumatic brain injury: Z87.820

## 2014-04-07 HISTORY — DX: Localization-related (focal) (partial) symptomatic epilepsy and epileptic syndromes with simple partial seizures, not intractable, without status epilepticus: G40.109

## 2014-04-07 HISTORY — DX: Presence of spectacles and contact lenses: Z97.3

## 2014-04-07 HISTORY — PX: GREEN LIGHT LASER TURP (TRANSURETHRAL RESECTION OF PROSTATE: SHX6260

## 2014-04-07 HISTORY — DX: Other obstructive and reflux uropathy: N13.8

## 2014-04-07 HISTORY — DX: Diverticulosis of large intestine without perforation or abscess without bleeding: K57.30

## 2014-04-07 HISTORY — DX: Vesicoureteral-reflux, unspecified: N13.70

## 2014-04-07 HISTORY — DX: Personal history of colonic polyps: Z86.010

## 2014-04-07 HISTORY — DX: Nocturia: R35.1

## 2014-04-07 HISTORY — DX: Personal history of adenomatous and serrated colon polyps: Z86.0101

## 2014-04-07 HISTORY — DX: Personal history of other mental and behavioral disorders: Z86.59

## 2014-04-07 HISTORY — DX: Urge incontinence: N39.41

## 2014-04-07 LAB — POCT HEMOGLOBIN-HEMACUE: Hemoglobin: 13.9 g/dL (ref 13.0–17.0)

## 2014-04-07 SURGERY — GREEN LIGHT LASER TURP (TRANSURETHRAL RESECTION OF PROSTATE
Anesthesia: General | Site: Prostate

## 2014-04-07 MED ORDER — CIPROFLOXACIN IN D5W 400 MG/200ML IV SOLN
INTRAVENOUS | Status: AC
  Filled 2014-04-07: qty 200

## 2014-04-07 MED ORDER — OXYCODONE HCL 5 MG PO TABS
5.0000 mg | ORAL_TABLET | Freq: Once | ORAL | Status: AC
  Administered 2014-04-07: 5 mg via ORAL
  Filled 2014-04-07: qty 1

## 2014-04-07 MED ORDER — OXYCODONE HCL 5 MG PO TABS
ORAL_TABLET | ORAL | Status: AC
  Filled 2014-04-07: qty 1

## 2014-04-07 MED ORDER — LIDOCAINE HCL (CARDIAC) 20 MG/ML IV SOLN
INTRAVENOUS | Status: DC | PRN
  Administered 2014-04-07: 50 mg via INTRAVENOUS

## 2014-04-07 MED ORDER — FENTANYL CITRATE 0.05 MG/ML IJ SOLN
25.0000 ug | INTRAMUSCULAR | Status: DC | PRN
  Administered 2014-04-07 (×4): 25 ug via INTRAVENOUS
  Filled 2014-04-07: qty 1

## 2014-04-07 MED ORDER — URELLE 81 MG PO TABS
ORAL_TABLET | ORAL | Status: AC
  Filled 2014-04-07: qty 1

## 2014-04-07 MED ORDER — EPHEDRINE SULFATE 50 MG/ML IJ SOLN
INTRAMUSCULAR | Status: DC | PRN
  Administered 2014-04-07 (×4): 5 mg via INTRAVENOUS

## 2014-04-07 MED ORDER — FENTANYL CITRATE 0.05 MG/ML IJ SOLN
INTRAMUSCULAR | Status: AC
  Filled 2014-04-07: qty 2

## 2014-04-07 MED ORDER — GLYCOPYRROLATE 0.2 MG/ML IJ SOLN
INTRAMUSCULAR | Status: DC | PRN
  Administered 2014-04-07: 0.2 mg via INTRAVENOUS

## 2014-04-07 MED ORDER — URIBEL 118 MG PO CAPS: 1.0000 | ORAL_CAPSULE | Freq: Three times a day (TID) | ORAL | Status: DC

## 2014-04-07 MED ORDER — CIPROFLOXACIN HCL 500 MG PO TABS: 500.0000 mg | ORAL_TABLET | Freq: Two times a day (BID) | ORAL | Status: DC

## 2014-04-07 MED ORDER — BELLADONNA ALKALOIDS-OPIUM 16.2-60 MG RE SUPP
RECTAL | Status: AC
  Filled 2014-04-07: qty 1

## 2014-04-07 MED ORDER — ACETAMINOPHEN 10 MG/ML IV SOLN
INTRAVENOUS | Status: DC | PRN
  Administered 2014-04-07: 1000 mg via INTRAVENOUS

## 2014-04-07 MED ORDER — CIPROFLOXACIN IN D5W 400 MG/200ML IV SOLN
400.0000 mg | INTRAVENOUS | Status: AC
  Administered 2014-04-07: 400 mg via INTRAVENOUS
  Filled 2014-04-07: qty 200

## 2014-04-07 MED ORDER — PROPOFOL 10 MG/ML IV BOLUS
INTRAVENOUS | Status: DC | PRN
  Administered 2014-04-07: 120 mg via INTRAVENOUS

## 2014-04-07 MED ORDER — MIDAZOLAM HCL 5 MG/5ML IJ SOLN
INTRAMUSCULAR | Status: DC | PRN
  Administered 2014-04-07: 1 mg via INTRAVENOUS

## 2014-04-07 MED ORDER — MIDAZOLAM HCL 2 MG/2ML IJ SOLN
INTRAMUSCULAR | Status: AC
  Filled 2014-04-07: qty 2

## 2014-04-07 MED ORDER — DEXAMETHASONE SODIUM PHOSPHATE 4 MG/ML IJ SOLN
INTRAMUSCULAR | Status: DC | PRN
  Administered 2014-04-07: 4 mg via INTRAVENOUS

## 2014-04-07 MED ORDER — LACTATED RINGERS IV SOLN
INTRAVENOUS | Status: DC
  Filled 2014-04-07: qty 1000

## 2014-04-07 MED ORDER — URELLE 81 MG PO TABS
1.0000 | ORAL_TABLET | Freq: Four times a day (QID) | ORAL | Status: DC
  Administered 2014-04-07: 81 mg via ORAL
  Filled 2014-04-07: qty 1

## 2014-04-07 MED ORDER — FENTANYL CITRATE 0.05 MG/ML IJ SOLN
INTRAMUSCULAR | Status: AC
  Filled 2014-04-07: qty 6

## 2014-04-07 MED ORDER — ONDANSETRON HCL 4 MG/2ML IJ SOLN
INTRAMUSCULAR | Status: DC | PRN
  Administered 2014-04-07: 4 mg via INTRAVENOUS

## 2014-04-07 MED ORDER — LACTATED RINGERS IV SOLN
INTRAVENOUS | Status: DC
  Administered 2014-04-07 (×2): via INTRAVENOUS
  Filled 2014-04-07: qty 1000

## 2014-04-07 MED ORDER — OXYCODONE-ACETAMINOPHEN 5-325 MG PO TABS: 1.0000 | ORAL_TABLET | ORAL | Status: DC | PRN

## 2014-04-07 MED ORDER — BELLADONNA ALKALOIDS-OPIUM 16.2-60 MG RE SUPP
RECTAL | Status: DC | PRN
  Administered 2014-04-07: 1 via RECTAL

## 2014-04-07 MED ORDER — FENTANYL CITRATE 0.05 MG/ML IJ SOLN
INTRAMUSCULAR | Status: DC | PRN
  Administered 2014-04-07 (×4): 25 ug via INTRAVENOUS

## 2014-04-07 MED ORDER — SODIUM CHLORIDE 0.9 % IR SOLN
Status: DC | PRN
  Administered 2014-04-07: 16000 mL

## 2014-04-07 MED ORDER — KETOROLAC TROMETHAMINE 30 MG/ML IJ SOLN
INTRAMUSCULAR | Status: DC | PRN
  Administered 2014-04-07: 30 mg via INTRAVENOUS

## 2014-04-07 SURGICAL SUPPLY — 29 items
BAG URINE DRAINAGE (UROLOGICAL SUPPLIES) ×3 IMPLANT
BAG URO CATCHER STRL LF (DRAPE) ×3 IMPLANT
CANISTER SUCT LVC 12 LTR MEDI- (MISCELLANEOUS) ×6 IMPLANT
CATH FOLEY 2WAY SLVR 30CC 22FR (CATHETERS) IMPLANT
CATH HEMA 3WAY 30CC 24FR RND (CATHETERS) ×3 IMPLANT
CLOTH BEACON ORANGE TIMEOUT ST (SAFETY) ×3 IMPLANT
DRAPE CAMERA CLOSED 9X96 (DRAPES) IMPLANT
ELECT BUTTON HF 24-28F 2 30DE (ELECTRODE) IMPLANT
ELECT LOOP MED HF 24F 12D (CUTTING LOOP) IMPLANT
ELECT LOOP MED HF 24F 12D CBL (CLIP) IMPLANT
ELECT RESECT VAPORIZE 12D CBL (ELECTRODE) IMPLANT
GLOVE BIOGEL M 6.5 STRL (GLOVE) ×3 IMPLANT
GLOVE BIOGEL M STRL SZ7.5 (GLOVE) ×3 IMPLANT
GLOVE BIOGEL PI IND STRL 6.5 (GLOVE) ×1 IMPLANT
GLOVE BIOGEL PI INDICATOR 6.5 (GLOVE) ×2
GOWN STRL REIN XL XLG (GOWN DISPOSABLE) IMPLANT
GOWN STRL REUS W/TWL LRG LVL3 (GOWN DISPOSABLE) ×3 IMPLANT
GOWN STRL REUS W/TWL XL LVL3 (GOWN DISPOSABLE) ×3 IMPLANT
HOLDER FOLEY CATH W/STRAP (MISCELLANEOUS) ×3 IMPLANT
IV NS 1000ML (IV SOLUTION) ×2
IV NS 1000ML BAXH (IV SOLUTION) ×1 IMPLANT
IV NS IRRIG 3000ML ARTHROMATIC (IV SOLUTION) ×15 IMPLANT
LASER FIBER /GREENLIGHT LASER (Laser) ×3 IMPLANT
LASER GREENLIGHT RENTAL P/PROC (Laser) ×3 IMPLANT
PACK CYSTO (CUSTOM PROCEDURE TRAY) ×3 IMPLANT
PLUG CATH AND CAP STER (CATHETERS) ×3 IMPLANT
SET ASPIRATION TUBING (TUBING) ×3 IMPLANT
SYR 30ML LL (SYRINGE) ×3 IMPLANT
SYRINGE IRR TOOMEY STRL 70CC (SYRINGE) IMPLANT

## 2014-04-07 NOTE — Discharge Instructions (Addendum)
Prostate Laser Surgery Prostate laser surgery is a procedure to eliminate prostate tissue. There are two types of prostate laser surgery: ablation (prostate tissue is melted away) and enucleation (prostate tissue is cut out). LET [facility] CARE PROVIDER KNOW ABOUT:  Any allergies you have.  All medicines you are taking, including vitamins, herbs, eye drops, creams, and over-the-counter medicines.  Previous problems you or members of your family have had with the use of anesthetics.  Any blood disorders you have.  Previous surgeries you have had.  Medical conditions you have. RISKS AND COMPLICATIONS  Generally prostate laser surgery is a safe procedure. However, as with any procedure, problems can occur. Possible problems include: 1. Bleeding and the need for a blood transfusion.  2. Urinary tract infection. 3. Erectile dysfunction. 4. Narrowing (scar or stricture) of the urethra, which blocks the flow of urine. 5. Dry ejaculation (semen is not released when you reach sexual climax). BEFORE THE PROCEDURE  1. If you are on blood thinners, such as warfarin or clopidogrel, or nonprescription pain-relieving medicines, such as naproxen sodium or ibuprofen, you may be asked to stop taking them before the procedure. 2. Your health care provider may ask you to start taking antibiotic medicines before the procedure as a precaution against a bacterial infection. The procedure will not be performed if your urine is infected. 3. You should have nothing to eat or drink for at least 8 hours before your procedure, or as suggested by your health care provider. You may have a sip of water to take medications not stopped for the procedure. PROCEDURE  You will be given one of the following:  1. A medicine that numbs the area (local anesthetic). 2. A medicine injected into your spine that numbs your body below the waist (spinal anesthetic). A sedative is usually given with spinal anesthetic so you will be  relaxed during the procedure. A viewing scope and instruments will be placed in a tube that is inserted through your penis, so no incisions will be needed to insert the scope and instruments. Depending on the type of laser used, the prostate tissue will either be vaporized or cut away. The laser beam will coagulate any small bleeding areas. At the end of the surgery, a special tube will be inserted into your bladder to drain the urine from your bladder (urinary catheter). AFTER THE PROCEDURE You will be sent to the recovery room for a short time. In the recovery room, you will receive fluids through an IV tube inserted in one of your veins. Your blood pressure and pulse will be checked frequently to make sure that they stabilize. Once you are eating and drinking fluids appropriately, the IV tube will be removed.  Depending on your specific needs, you may be admitted to the hospital or you will be sent home after the procedure. If you are sent home: 1. You may be sent home with elastic support stockings to help prevent blood clots in your legs. 2. You will also probably be given an antibiotic medicine. 3. Unless told otherwise, you may restart your other medications. 4. You may be given a stool softener. Document Released: 04/14/2005 Document Revised: 04/19/2013 Document Reviewed: 10/04/2012 [facility] Patient Information 2015 [place], [place]. This information is not intended to replace advice given to you by your health care provider. Make sure you discuss any questions you have with your health care provider.  Benign Prostatic Hyperplasia An enlarged prostate (benign prostatic hyperplasia) is common in older men. You may experience  the following:  Weak urine stream.  Dribbling.  Feeling like the bladder has not emptied completely.  Difficulty starting urination.  Getting up frequently at night to urinate.  Urinating more frequently during the day. HOME CARE INSTRUCTIONS  Monitor your  prostatic hyperplasia for any changes. The following actions may help to alleviate any discomfort you are experiencing: 6. Give yourself time when you urinate. 7. Stay away from alcohol. 8. Avoid beverages containing caffeine, such as coffee, tea, and colas, because they can make the problem worse. 9. Avoid decongestants, antihistamines, and some prescription medicines that can make the problem worse. 10. Follow up with your health care provider for further treatment as recommended. SEEK MEDICAL CARE IF: 4. You are experiencing progressive difficulty voiding. 5. Your urine stream is progressively getting narrower. 6. You are awaking from sleep with the urge to void more frequently. 7. You are constantly feeling the need to void. 8. You experience loss of urine, especially in small amounts. SEEK IMMEDIATE MEDICAL CARE IF:  3. You develop increased pain with urination or are unable to urinate. 4. You develop severe abdominal pain, vomiting, a high fever, or fainting. 5. You develop back pain or blood in your urine. MAKE SURE YOU:  5. Understand these instructions. 6. Will watch your condition. 7. Will get help right away if you are not doing well or get worse. Document Released: 04/14/2005 Document Revised: 12/15/2012 Document Reviewed: 09/14/2012 [facility] Patient Information 2015 [place], [place]. This information is not intended to replace advice given to you by your health care provider. Make sure you discuss any questions you have with your health care provider.   1. Stop ditropan ( antispasmotic) Sunday.  2. After catheter is removed Monday, and pt voids Monday, may reduce Hytrin to 1/day for 1 week, then stop.  3. Uribel has both mild antiinfective and antispasmotic, as well as anti irritant. Will turn urine blue-green.  4. Finish cipro.     Post Anesthesia Home Care Instructions  Activity: Get plenty of rest for the remainder of the day. A responsible adult should stay with you  for 24 hours following the procedure.  For the next 24 hours, DO NOT: -Drive a car -Paediatric nurse -Drink alcoholic beverages -Take any medication unless instructed by your physician -Make any legal decisions or sign important papers.  Meals: Start with liquid foods such as gelatin or soup. Progress to regular foods as tolerated. Avoid greasy, spicy, heavy foods. If nausea and/or vomiting occur, drink only clear liquids until the nausea and/or vomiting subsides. Call your physician if vomiting continues.  Special Instructions/Symptoms: Your throat may feel dry or sore from the anesthesia or the breathing tube placed in your throat during surgery. If this causes discomfort, gargle with warm salt water. The discomfort should disappear within 24 hours.  Foley Catheter Care A Foley catheter is a soft, flexible tube that is placed into the bladder to drain urine. A Foley catheter may be inserted if:  You leak urine or are not able to control when you urinate (urinary incontinence).  You are not able to urinate when you need to (urinary retention).  You had prostate surgery or surgery on the genitals.  You have certain medical conditions, such as multiple sclerosis, dementia, or a spinal cord injury. If you are going home with a Foley catheter in place, follow the instructions below. TAKING CARE OF THE CATHETER 11. Wash your hands with soap and water. 12. Using mild soap and warm water on a clean  washcloth:  Clean the area on your body closest to the catheter insertion site using a circular motion, moving away from the catheter. Never wipe toward the catheter because this could sweep bacteria up into the urethra and cause infection.  Remove all traces of soap. Pat the area dry with a clean towel. For males, reposition the foreskin. 79. Attach the catheter to your leg so there is no tension on the catheter. Use adhesive tape or a leg strap. If you are using adhesive tape, remove any  sticky residue left behind by the previous tape you used. 14. Keep the drainage bag below the level of the bladder, but keep it off the floor. 15. Check throughout the day to be sure the catheter is working and urine is draining freely. Make sure the tubing does not become kinked. 16. Do not pull on the catheter or try to remove it. Pulling could damage internal tissues. TAKING CARE OF THE DRAINAGE BAGS You will be given two drainage bags to take home. One is a large overnight drainage bag, and the other is a smaller leg bag that fits underneath clothing. You may wear the overnight bag at any time, but you should never wear the smaller leg bag at night. Follow the instructions below for how to empty, change, and clean your drainage bags. Emptying the Drainage Bag You must empty your drainage bag when it is  - full or at least 2-3 times a day. 9. Wash your hands with soap and water. 10. Keep the drainage bag below your hips, below the level of your bladder. This stops urine from going back into the tubing and into your bladder. 11. Hold the dirty bag over the toilet or a clean container. 12. Open the pour spout at the bottom of the bag and empty the urine into the toilet or container. Do not let the pour spout touch the toilet, container, or any other surface. Doing so can place bacteria on the bag, which can cause an infection. 13. Clean the pour spout with a gauze pad or cotton ball that has rubbing alcohol on it. 14. Close the pour spout. 15. Attach the bag to your leg with adhesive tape or a leg strap. 16. Wash your hands well. Changing the Drainage Bag Change your drainage bag once a month or sooner if it starts to smell bad or look dirty. Below are steps to follow when changing the drainage bag. 6. Wash your hands with soap and water. 7. Pinch off the rubber catheter so that urine does not spill out. 8. Disconnect the catheter tube from the drainage tube at the connection valve. Do not let  the tubes touch any surface. 9. Clean the end of the catheter tube with an alcohol wipe. Use a different alcohol wipe to clean the end of the drainage tube. 10. Connect the catheter tube to the drainage tube of the clean drainage bag. 11. Attach the new bag to the leg with adhesive tape or a leg strap. Avoid attaching the new bag too tightly. 12. Wash your hands well. Cleaning the Drainage Bag 8. Wash your hands with soap and water. 9. Wash the bag in warm, soapy water. 10. Rinse the bag thoroughly with warm water. 11. Fill the bag with a solution of white vinegar and water (1 cup vinegar to 1 qt warm water [.2 L vinegar to 1 L warm water]). Close the bag and soak it for 30 minutes in the solution. 12. Rinse the bag  with warm water. 42. Hang the bag to dry with the pour spout open and hanging downward. 14. Store the clean bag (once it is dry) in a clean plastic bag. 15. Wash your hands well. PREVENTING INFECTION  Wash your hands before and after handling your catheter.  Take showers daily and wash the area where the catheter enters your body. Do not take baths. Replace wet leg straps with dry ones, if this applies.  Do not use powders, sprays, or lotions on the genital area. Only use creams, lotions, or ointments as directed by your caregiver.  For females, wipe from front to back after each bowel movement.  Drink enough fluids to keep your urine clear or pale yellow unless you have a fluid restriction.  Do not let the drainage bag or tubing touch or lie on the floor.  Wear cotton underwear to absorb moisture and to keep your skin drier. SEEK MEDICAL CARE IF:   Your urine is cloudy or smells unusually bad.  Your catheter becomes clogged.  You are not draining urine into the bag or your bladder feels full.  Your catheter starts to leak. SEEK IMMEDIATE MEDICAL CARE IF:   You have pain, swelling, redness, or pus where the catheter enters the body.  You have pain in the abdomen,  legs, lower back, or bladder.  You have a fever.  You see blood fill the catheter, or your urine is pink or red.  You have nausea, vomiting, or chills.  Your catheter gets pulled out. MAKE SURE YOU:   Understand these instructions.  Will watch your condition.  Will get help right away if you are not doing well or get worse. Document Released: 04/14/2005 Document Revised: 08/29/2013 Document Reviewed: 04/05/2012 [facility] Patient Information 2015 [place], [place]. This information is not intended to replace advice given to you by your health care provider. Make sure you discuss any questions you have with your health care provider.

## 2014-04-07 NOTE — Op Note (Signed)
Pre-operative diagnosis :  Severe BPH, with urinary frequency, urgency,   Postoperative diagnosis:  Same  Operation:  Greenlight TUIP  Surgeon:  S. Gaynelle Arabian, MD  First assistant:  None  Anesthesia:  General LMA  Preparation:  After appropriate preanesthesia, the patient was brought to the operative room, placed on the operating table in dorsal supine position where general LMA anesthesia was introduced. He was then replaced in the dorsal lithotomy position with the pubis was prepped with Betadine solution and draped in usual fashion. The arm and was double checked. The history was double checked. It is noted the patient has severe end-stage bladder outlet obstruction from BPH. However, the patient desires to have the least invasive treatment possible, therefore for transurethral incision of the prostate, with the greenlight laser. Review history:  1. Benign prostatic hyperplasia with urinary obstruction (N40.1) 2. Elevated prostate specific antigen (PSA) (R97.2) 3. Erectile dysfunction due to arterial insufficiency (N52.01) 4. Incomplete bladder emptying (R33.9) 5. Nocturia (R35.1) 6. 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 He has a hx of urge incontinence and decreased flow, much better on the combination of Hytrin 10 mg and oxybutynin 10 mg daily.( BPH and OAB) He has postvoid dribbling, finds that if he sits to urinate, he voids more effeciently. IPSS=28. ( normal =7).     He understands that his sexual dysfunction should not worsen after hernia surgery and if he ever needed a catheter after hernia surgery, it would likely be temporary. He has been getting Cialis 5mg  from [place] for years. He really thinks when he takes a small amount daily it helps lower urinary tract symptoms. He thinks Cialis works better than Viagra in the  past.    In April 2014, his residual was 212 mL. He did not have hydronephrosis. He is on terazosin 10 mg twice a day. He was urodynamically obstructed in the past with an overactive bladder and elevated residuals. He has always been interested in an office based procedure versus surgery and was concerned about sexual and ejaculatory issues.     Uroflowmetry on 02/13/14: He voided 132 mL with a maximum flow of 6 mL/sec. Pattern was prolonged. Bladder scan residual was 366 mL.   Statement of  Likelihood of Success: Excellent. TIME-OUT observed.:  Procedure:  Cystourethroscopy,, and the Greenlight laser was employed. Cystoscopy revealed trilobar BPH. The verumontanum was maintained in site. The patient was found to have massive enlargement of his lateral lobes. He had median lobe enlargement  identified as well. Cystoscopy revealed marked trabeculation, and cellule formation, and bladder diverticular formation. This was photo documented. There was no evidence of bladder stone or tumor.  Using laser time of 23 minutes and 49 seconds, with a total of 109, 286 J of energy, at 27 W of energy, the patient underwent Greenlight laser vaporization from the bladder neck, to the Veru. A large trench was made in the prostate. At the patient's request, complete resection of the prostate was not accomplished, in order to preserve as much as sexual function as possible, with as much ejaculate as possible.   The low energy was used, in order to try to keep the patient from having postoperative frequency and urgency. Minimal bleeding was noted, which was controlled with the laser. Following the procedure, a size 24 hematuria catheter was placed, but there was no bleeding noted. The patient received a  B and O suppository, as well as IV Tylenol, and IV Toradol, IV Zofran, and IV antibiotic. He was awakened, and taken to recovery room in good condition

## 2014-04-07 NOTE — Anesthesia Procedure Notes (Signed)
Procedure Name: LMA Insertion Date/Time: 04/07/2014 9:03 AM Performed by: Denna Haggard D Pre-anesthesia Checklist: Patient identified, Emergency Drugs available, Suction available and Patient being monitored Patient Re-evaluated:Patient Re-evaluated prior to inductionOxygen Delivery Method: Circle System Utilized Preoxygenation: Pre-oxygenation with 100% oxygen Intubation Type: IV induction Ventilation: Mask ventilation without difficulty LMA: LMA inserted LMA Size: 4.0 Number of attempts: 1 Airway Equipment and Method: bite block Placement Confirmation: positive ETCO2 Tube secured with: Tape Dental Injury: Teeth and Oropharynx as per pre-operative assessment

## 2014-04-07 NOTE — H&P (Signed)
Reason For Visit Consult to discuss options   Active Problems Problems  1. Benign prostatic hyperplasia with urinary obstruction (N40.1) 2. Elevated prostate specific antigen (PSA) (R97.2) 3. Erectile dysfunction due to arterial insufficiency (N52.01) 4. Incomplete bladder emptying (R33.9) 5. Nocturia (R35.1) 6. 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 He has a hx of urge incontinence and decreased flow,   much better on the combination of Hytrin 10 mg and oxybutynin 10 mg daily.( BPH and OAB) He has  postvoid dribbling, finds that  if he sits to urinate, he voids more effeciently. IPSS=28. ( normal =7).     He understands that his sexual dysfunction should not worsen after hernia surgery and if he ever needed a catheter after hernia surgery, it would likely be temporary. He has been getting Cialis 5mg  from [place] for years. He really thinks when he takes a small amount daily it helps lower urinary tract symptoms. He thinks Cialis works better than Viagra in the past.    In April 2014, his residual was 212 mL. He did not have hydronephrosis. He is on terazosin 10 mg twice a day. He was urodynamically obstructed in the past with an overactive bladder and elevated residuals. He has always been interested in an office based procedure versus surgery and was concerned about sexual and ejaculatory issues.     Uroflowmetry on 02/13/14: He voided 132 mL with a maximum flow of 6 mL/sec. Pattern was prolonged. Bladder scan residual was 366 mL.   Past Medical History Problems  1. History of Dysuria (R30.0) 2. History of seizure disorder (Z86.69)  Current Meds 1. Cialis 5 MG Oral Tablet; 5 mg daily;  Therapy: 29Oct2014 to (Evaluate:24Oct2015); Last Rx:29Oct2014  Ordered 2. Cialis 5 MG Oral Tablet;  Therapy: (Recorded:25Mar2011) to Recorded 3.  Divalproex Sodium 250 MG Oral Tablet Delayed Release;  Therapy: (Recorded:27Mar2014) to Recorded 4. LamoTRIgine 150 MG Oral Tablet;  Therapy: (Recorded:05Aug2010) to Recorded 5. Oxybutynin Chloride ER 10 MG Oral Tablet Extended Release 24 Hour;  Take 1 tablet daily;  Therapy: [id number] to (Evaluate:18Nov2015)  Requested for: 21Aug2015;  Last Rx:20Aug2015 Ordered 6. Terazosin HCl - 10 MG Oral Capsule; Take 1 capsule by mouth every day;  Therapy: 29Oct2014 to (Last Rx:29Oct2014)  Requested for: 29Oct2014  Ordered  Allergies Medication  1. Cipro TABS  Social History Problems    Alcohol Use   Caffeine Use   Marital History - Currently Married   Occupation:   Tobacco Use  Review of Systems  Genitourinary: feelings of urinary urgency, nocturia, incontinence, weak urinary stream, incomplete emptying of bladder and erectile dysfunction.    Vitals Vital Signs [Data Includes: Last 1 Day]  Recorded: [id number] 04:24PM  Blood Pressure: 96 / 66 Temperature: 97.2 F Heart Rate: 98  Physical Exam Constitutional: Well nourished and well developed . No acute distress.  ENT:. The ears and nose are normal in appearance.  Neck: The appearance of the neck is normal and no neck mass is present.  Pulmonary: No respiratory distress and normal respiratory rhythm and effort.  Cardiovascular:. No peripheral edema.  Abdomen: The abdomen is soft and nontender. No masses are palpated. No CVA tenderness. No hernias are palpable. No hepatosplenomegaly noted.  Genitourinary: Examination of the penis demonstrates no discharge, no masses, no lesions and a normal meatus. The scrotum is without lesions. The right epididymis is palpably normal and  non-tender. The left epididymis is palpably normal and non-tender. The right testis is non-tender and without masses. The left testis is non-tender and without masses.  Lymphatics: The femoral and inguinal nodes are not enlarged or tender.  Skin: Normal skin  turgor, no visible rash and no visible skin lesions.  Neuro/Psych:. Mood and affect are appropriate.    Results/Data Urine [Data Includes: Last 1 Day]   [id number]  COLOR YELLOW   APPEARANCE CLEAR   SPECIFIC GRAVITY 1.020   pH 6.0   GLUCOSE NEG mg/dL  BILIRUBIN NEG   KETONE 15 mg/dL  BLOOD NEG   PROTEIN NEG mg/dL  UROBILINOGEN 0.2 mg/dL  NITRITE NEG   LEUKOCYTE ESTERASE NEG    Procedure POSST void=443cc  Peak flow rate=1cc/sec. mean 18cc.    He is not able to tolerate PUS probe.     Assessment Assessed  1. Benign prostatic hyperplasia with urinary obstruction (N40.1) 2. Incomplete bladder emptying (R33.9) 3. Nocturia (R35.1) 4. Urinary frequency (R35.0) 5. Vesicoureteral-reflux (N13.70) 6. 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 We have discussed the alternatives for his surgery. He is not a candidate for in-office procedures, because of his anxiety and inability to lay still. He has researched the literature, and wants to have Greenlight laser treatment, and understands that he will need a catheter for 2 days post-op. Surgery will be performed as an outpatient. He will have the possibility of retrograde ejacuation, as do all prostate surgeries, but less chance than with standard TURP. However, with an IPSS of 28, he understands that he needs something done to improve his voiding ability.We have discussed the hypertrophy of the bladder muscle, and the way laser will make a trench in the prostate to try to open his bladder neck in order for him to void. He will need to be weaned from his Hytrin.  Plan Benign prostatic hyperplasia with urinary obstruction  1. Complex Uroflowmetry; Status:Hold For - Appointment,Date of Service;  Requested for:10Nov2015;  2. Cysto; Status:Complete;   Done: [id number] 3. PVR U/S; Status:Hold For - Appointment,Date of Service; Requested  for:10Nov2015;  Health Maintenance  4. UA With REFLEX; [Do Not  Release]; Status:Resulted - Requires  Verification;   Done: [id number] 04:12PM  He will need post op flow rate and pvr. after Greenlight laser surgery. He will need to be weaned from the terazosin ( Hytrin). Caution re-strting at 5mg  dose-dizziness.   Signatures

## 2014-04-07 NOTE — Interval H&P Note (Signed)
History and Physical Interval Note:  04/07/2014 8:47 AM  John Holt  has presented today for surgery, with the diagnosis of BPH  The various methods of treatment have been discussed with the patient and family. After consideration of risks, benefits and other options for treatment, the patient has consented to  Procedure(s): GREEN LIGHT LASER TURP (TRANSURETHRAL RESECTION OF PROSTATE (N/A) as a surgical intervention .  The patient's history has been reviewed, patient examined, no change in status, stable for surgery.  I have reviewed the patient's chart and labs.  Questions were answered to the patient's satisfaction.     Carolan Clines I

## 2014-04-07 NOTE — Anesthesia Postprocedure Evaluation (Signed)
  Anesthesia Post-op Note  Patient: John Holt  Procedure(s) Performed: Procedure(s) (LRB): GREEN LIGHT LASER TURP (TRANSURETHRAL RESECTION OF PROSTATE (N/A)  Patient Location: PACU  Anesthesia Type: General  Level of Consciousness: awake and alert   Airway and Oxygen Therapy: Patient Spontanous Breathing  Post-op Pain: mild  Post-op Assessment: Post-op Vital signs reviewed, Patient's Cardiovascular Status Stable, Respiratory Function Stable, Patent Airway and No signs of Nausea or vomiting  Last Vitals:  Filed Vitals:   04/07/14 1055  BP:   Pulse: 78  Temp:   Resp: 11    Post-op Vital Signs: stable   Complications: No apparent anesthesia complications

## 2014-04-07 NOTE — Transfer of Care (Signed)
Immediate Anesthesia Transfer of Care Note  Patient: John Holt  Procedure(s) Performed: Procedure(s) (LRB): GREEN LIGHT LASER TURP (TRANSURETHRAL RESECTION OF PROSTATE (N/A)  Patient Location: PACU  Anesthesia Type: General  Level of Consciousness: awake, oriented, sedated and patient cooperative  Airway & Oxygen Therapy: Patient Spontanous Breathing and Patient connected to face mask oxygen  Post-op Assessment: Report given to PACU RN and Post -op Vital signs reviewed and stable  Post vital signs: Reviewed and stable  Complications: No apparent anesthesia complications

## 2014-04-10 ENCOUNTER — Encounter (HOSPITAL_BASED_OUTPATIENT_CLINIC_OR_DEPARTMENT_OTHER): Payer: Self-pay | Admitting: Urology

## 2014-04-19 DIAGNOSIS — R350 Frequency of micturition: Secondary | ICD-10-CM | POA: Diagnosis not present

## 2014-04-19 DIAGNOSIS — N401 Enlarged prostate with lower urinary tract symptoms: Secondary | ICD-10-CM | POA: Diagnosis not present

## 2014-04-25 DIAGNOSIS — N400 Enlarged prostate without lower urinary tract symptoms: Secondary | ICD-10-CM | POA: Diagnosis present

## 2014-04-25 DIAGNOSIS — G9381 Temporal sclerosis: Secondary | ICD-10-CM | POA: Diagnosis not present

## 2014-04-25 DIAGNOSIS — F09 Unspecified mental disorder due to known physiological condition: Secondary | ICD-10-CM | POA: Diagnosis not present

## 2014-04-25 DIAGNOSIS — G40119 Localization-related (focal) (partial) symptomatic epilepsy and epileptic syndromes with simple partial seizures, intractable, without status epilepticus: Secondary | ICD-10-CM | POA: Diagnosis not present

## 2014-04-25 DIAGNOSIS — F419 Anxiety disorder, unspecified: Secondary | ICD-10-CM | POA: Diagnosis present

## 2014-04-25 DIAGNOSIS — H3530 Unspecified macular degeneration: Secondary | ICD-10-CM | POA: Diagnosis present

## 2014-04-25 DIAGNOSIS — I499 Cardiac arrhythmia, unspecified: Secondary | ICD-10-CM | POA: Diagnosis not present

## 2014-04-25 DIAGNOSIS — R002 Palpitations: Secondary | ICD-10-CM | POA: Diagnosis not present

## 2014-04-25 DIAGNOSIS — R413 Other amnesia: Secondary | ICD-10-CM | POA: Diagnosis not present

## 2014-04-30 DIAGNOSIS — G40219 Localization-related (focal) (partial) symptomatic epilepsy and epileptic syndromes with complex partial seizures, intractable, without status epilepticus: Secondary | ICD-10-CM | POA: Insufficient documentation

## 2014-04-30 DIAGNOSIS — R413 Other amnesia: Secondary | ICD-10-CM | POA: Insufficient documentation

## 2014-08-02 ENCOUNTER — Encounter: Payer: Self-pay | Admitting: Internal Medicine

## 2014-10-24 ENCOUNTER — Encounter: Payer: Self-pay | Admitting: Internal Medicine

## 2015-01-06 ENCOUNTER — Emergency Department (HOSPITAL_COMMUNITY)
Admission: EM | Admit: 2015-01-06 | Discharge: 2015-01-06 | Disposition: A | Discharge status: Home / Self Care | Payer: Medicare Other | Attending: Emergency Medicine | Admitting: Emergency Medicine

## 2015-01-06 ENCOUNTER — Encounter (HOSPITAL_COMMUNITY): Payer: Self-pay | Admitting: *Deleted

## 2015-01-06 DIAGNOSIS — G40909 Epilepsy, unspecified, not intractable, without status epilepticus: Secondary | ICD-10-CM | POA: Insufficient documentation

## 2015-01-06 DIAGNOSIS — Z792 Long term (current) use of antibiotics: Secondary | ICD-10-CM | POA: Insufficient documentation

## 2015-01-06 DIAGNOSIS — Z87891 Personal history of nicotine dependence: Secondary | ICD-10-CM | POA: Insufficient documentation

## 2015-01-06 DIAGNOSIS — F319 Bipolar disorder, unspecified: Secondary | ICD-10-CM | POA: Insufficient documentation

## 2015-01-06 DIAGNOSIS — F419 Anxiety disorder, unspecified: Secondary | ICD-10-CM | POA: Insufficient documentation

## 2015-01-06 DIAGNOSIS — Z79899 Other long term (current) drug therapy: Secondary | ICD-10-CM | POA: Diagnosis not present

## 2015-01-06 DIAGNOSIS — Z86010 Personal history of colonic polyps: Secondary | ICD-10-CM | POA: Diagnosis not present

## 2015-01-06 DIAGNOSIS — R569 Unspecified convulsions: Secondary | ICD-10-CM

## 2015-01-06 LAB — I-STAT CHEM 8, ED
BUN: 29 mg/dL — AB (ref 6–20)
CALCIUM ION: 1.22 mmol/L (ref 1.13–1.30)
Chloride: 103 mmol/L (ref 101–111)
Creatinine, Ser: 1 mg/dL (ref 0.61–1.24)
Glucose, Bld: 88 mg/dL (ref 65–99)
HCT: 43 % (ref 39.0–52.0)
Hemoglobin: 14.6 g/dL (ref 13.0–17.0)
Potassium: 3.9 mmol/L (ref 3.5–5.1)
SODIUM: 141 mmol/L (ref 135–145)
TCO2: 26 mmol/L (ref 0–100)

## 2015-01-06 NOTE — Discharge Instructions (Signed)

## 2015-01-06 NOTE — ED Notes (Signed)
Pt arrives from home via [facility]. Pt had a witnessed grand mal seizure. Pt has a hx of epilepsy and takes meds daily. Pt isn't post ictal upon arrival to ED.

## 2015-01-07 NOTE — ED Provider Notes (Signed)
CSN: [id number]     Arrival date & time 01/06/15  1806 History   First MD Initiated Contact with Patient 01/06/15 1806     Chief Complaint  Patient presents with  . Seizures     Patient is a 69 y.o. male presenting with seizures. The history is provided by the patient, the EMS personnel and the spouse. No language interpreter was used.  Seizures   Mr. Reffner presents for evaluation following a seizure. He has a history of seizure disorder with primarily a complex partial nighttime seizures. According to EMS he had a witnessed generalized seizure, duration was not noted. He denies missing any of his medications and is followed by neurology at [facility] He denies any recent illnesses. No fevers, vomiting, numbness, weakness, head injury. He had a brief postictal episode for EMS.  Past Medical History  Diagnosis Date  . History of closed head injury     2000-- fell off ladder--  temporary memory loss resolved  . History of psychosis     x2  last one documented 2006  w/ hallucination  and suicide ideation  . Bipolar 1 disorder, mixed     w/ hx psychosis/ dissociative reactions/  suicide ideations and attempt  . Temporal lobe epilepsy syndrome neurologist-  dr Leta Baptist--  avergae one every 2 weeks "legs jerking around, per wife , pt unaware he's doing this"    localization-related right temporal lobe --  mostly nocturnal w/ bilateral lower extremitity movement, staring and moaning then dissorietation afterwards (last daytime seizure June 2013)  . Sigmoid diverticulosis   . History of adenomatous polyp of colon   . BPH (benign prostatic hypertrophy) with urinary obstruction   . Urge urinary incontinence   . Frequency of urination   . Vesicoureteral reflux, bilateral   . Anxiety disorder   . History of panic attacks   . Nocturia   . Wears glasses    Past Surgical History  Procedure Laterality Date  . Inguinal hernia repair Bilateral 04/15/2013    Procedure: LAPAROSCOPIC BILATERAL  INGUINAL HERNIA REPAIR;  Surgeon: Gayland Curry, MD;  Location: [facility];  Service: General;  Laterality: Bilateral;  . Umbilical hernia repair N/A 04/15/2013    Procedure: OPEN HERNIA REPAIR UMBILICAL ADULT;  Surgeon: Gayland Curry, MD;  Location: [facility];  Service: General;  Laterality: N/A;  . Insertion of mesh N/A 04/15/2013    Procedure: INSERTION OF MESH;  Surgeon: Gayland Curry, MD;  Location: [facility];  Service: General;  Laterality: N/A;  . Colonoscopy w/ polypectomy  09-11-2009  . Green light laser turp (transurethral resection of prostate N/A 04/07/2014    Procedure: GREEN LIGHT LASER TURP (TRANSURETHRAL RESECTION OF PROSTATE;  Surgeon: Ailene Rud, MD;  Location: [facility];  Service: Urology;  Laterality: N/A;   Family History  Problem Relation Age of Onset  . Seizures Father   . Heart disease Father   . Cancer Father     prostat  . Cancer Mother     stomach   Social History  Substance Use Topics  . Smoking status: Former Smoker -- 1.00 packs/day for 15 years    Types: Cigarettes    Quit date: 04/03/1972  . Smokeless tobacco: Never Used  . Alcohol Use: 8.4 oz/week    14 Glasses of wine per week     Comment: 2 wine daily    Review of Systems  Neurological: Positive for seizures.  All other systems reviewed and are negative.  Allergies  Review of patient's allergies indicates no known allergies.  Home Medications   Prior to Admission medications   Medication Sig Start Date End Date Taking? Authorizing Provider  ciprofloxacin (CIPRO) 500 MG tablet Take 1 tablet (500 mg total) by mouth 2 (two) times daily. 04/07/14   Carolan Clines, MD  divalproex (DEPAKOTE ER) 250 MG 24 hr tablet TAKE 1 TABLET (250 MG TOTAL) BY MOUTH 2 (TWO) TIMES DAILY. 11/30/13   Penni Bombard, MD  lamoTRIgine (LAMICTAL) 100 MG tablet TAKE 1 TABLET (100 MG TOTAL) BY MOUTH 2 (TWO) TIMES DAILY. 11/30/13   Penni Bombard, MD  Meth-Hyo-M Barnett Hatter Phos-Ph Sal (URIBEL)  118 MG CAPS Take 1 capsule (118 mg total) by mouth 4 (four) times daily - after meals and at bedtime. 04/07/14   Carolan Clines, MD  Multiple Vitamin (MULTIVITAMIN) tablet Take 1 tablet by mouth daily.    Historical Provider, MD  oxybutynin (DITROPAN-XL) 10 MG 24 hr tablet Take 10 mg by mouth daily.     Historical Provider, MD  oxyCODONE-acetaminophen (ROXICET) 5-325 MG per tablet Take 1 tablet by mouth every 4 (four) hours as needed for severe pain. 04/07/14   Carolan Clines, MD  tadalafil (CIALIS) 5 MG tablet Take 5 mg by mouth daily.     Historical Provider, MD  terazosin (HYTRIN) 10 MG capsule Take 10 mg by mouth 2 (two) times daily.     Historical Provider, MD   BP 106/62 mmHg  Pulse 56  Temp(Src) 98.7 F (37.1 C) (Oral)  Resp 19  SpO2 95% Physical Exam  Constitutional: He is oriented to person, place, and time. He appears well-developed and well-nourished.  HENT:  Head: Normocephalic and atraumatic.  Eyes: EOM are normal. Pupils are equal, round, and reactive to light.  Cardiovascular: Normal rate and regular rhythm.   No murmur heard. Pulmonary/Chest: Effort normal and breath sounds normal. No respiratory distress.  Abdominal: Soft. There is no tenderness. There is no rebound and no guarding.  Musculoskeletal: He exhibits no edema or tenderness.  Neurological: He is alert and oriented to person, place, and time. No cranial nerve deficit. Coordination normal.  5 out of 5 strength in all 4 extremities.  Skin: Skin is warm and dry.  Psychiatric: He has a normal mood and affect. His behavior is normal.  Nursing note and vitals reviewed.   ED Course  Procedures (including critical care time) Labs Review Labs Reviewed  I-STAT CHEM 8, ED - Abnormal; Notable for the following:    BUN 29 (*)    All other components within normal limits    Imaging Review No results found. I have personally reviewed and evaluated these images and lab results as part of my medical  decision-making.   EKG Interpretation   Date/Time:  Saturday January 06 2015 18:30:36 EDT Ventricular Rate:  67 PR Interval:  166 QRS Duration: 109 QT Interval:  386 QTC Calculation: 407 R Axis:   80 Text Interpretation:  Sinus rhythm Confirmed by Hazle Coca [phone]) on  01/06/2015 6:37:57 PM      MDM   Final diagnoses:  Seizure    Patient with seizure disorder, here for evaluation following seizure. He is taking his medications as prescribed. No focal neurologic deficits in the department and patient is without complaints. No recent head injury. No evidence of acute hyponatremia or significant electrolyte abnormality. Discussed with patient and wife home care for seizure disorder as well as outpatient follow-up with Dr. Jacelyn Grip at [facility].  Quintella Reichert, MD 01/07/15 [phone]

## 2015-02-22 NOTE — Telephone Encounter (Signed)
Error

## 2015-08-15 ENCOUNTER — Encounter: Payer: Self-pay | Admitting: Internal Medicine

## 2015-09-17 ENCOUNTER — Ambulatory Visit (AMBULATORY_SURGERY_CENTER): Payer: Self-pay | Admitting: *Deleted

## 2015-09-17 ENCOUNTER — Telehealth: Payer: Self-pay | Admitting: *Deleted

## 2015-09-17 VITALS — Ht 68.0 in | Wt 158.2 lb

## 2015-09-17 DIAGNOSIS — Z86010 Personal history of colonic polyps: Secondary | ICD-10-CM

## 2015-09-17 MED ORDER — NA SULFATE-K SULFATE-MG SULF 17.5-3.13-1.6 GM/177ML PO SOLN: 1.0000 | Freq: Once | ORAL | Status: DC

## 2015-09-17 NOTE — Progress Notes (Signed)
No egg or soy allergy known to patient  No issues with past sedation with any surgeries  or procedures, no intubation problems  No diet pills per patient No home 02 use per patient  No blood thinners per patient  Pt denies issues with constipation  emmi video to e mail Sample suprep due to insurance Medication Samples have been provided to the patient.  Drug name: suprep          Qty: 1  LOTMA:[id number]  Exp.Date: 3-19  The patient has been instructed regarding the correct time, dose, and frequency of taking this medication, including desired effects and most common side effects.   John Holt 2:29 PM 09/17/2015

## 2015-09-17 NOTE — Telephone Encounter (Signed)
Dr Henrene Pastor, I saw [place] today. He has epilepsy, states that his last seizure was 2 weeks ago. He states his epilepsy in controlled, sees his neurologist twice a year . He states his seizures are shaking episodes in his sleep and that they happen every 3 weeks or so.  Twice a year he will have a " black out " seizure and the last "bad" seizure was 01-06-2015 which he was seen in the ED for. He has a colon scheduled with you 6-5 Monday at 2 pm, pt has a hx of TA polyps with last colon 09-11-2009. Is it okay to proceed with his colon? Thanks for your time,  Marijean Niemann

## 2015-09-17 NOTE — Telephone Encounter (Signed)
Okay to proceed.  

## 2015-09-17 NOTE — Telephone Encounter (Signed)
Thanks, will proceed as  scheduled --pt's wife notified today  Marijean Niemann

## 2015-10-01 ENCOUNTER — Ambulatory Visit (AMBULATORY_SURGERY_CENTER): Payer: BC Managed Care – PPO | Admitting: Internal Medicine

## 2015-10-01 ENCOUNTER — Encounter: Payer: Self-pay | Admitting: Internal Medicine

## 2015-10-01 VITALS — BP 103/62 | HR 53 | Temp 99.3°F | Resp 11 | Ht 68.0 in | Wt 158.0 lb

## 2015-10-01 DIAGNOSIS — D123 Benign neoplasm of transverse colon: Secondary | ICD-10-CM | POA: Diagnosis not present

## 2015-10-01 DIAGNOSIS — Z86010 Personal history of colonic polyps: Secondary | ICD-10-CM

## 2015-10-01 DIAGNOSIS — D125 Benign neoplasm of sigmoid colon: Secondary | ICD-10-CM

## 2015-10-01 MED ORDER — SODIUM CHLORIDE 0.9 % IV SOLN: 500.0000 mL | INTRAVENOUS | Status: DC

## 2015-10-01 NOTE — Op Note (Signed)
[facility] Patient Name: John Holt Procedure Date: 10/01/2015 1:18 PM MRN: [id number] Endoscopist: Docia Chuck. Henrene Pastor , MD Age: 70 Referring MD:  Date of Birth: 1945/09/30 Gender: Male Procedure:                Colonoscopy, with cold snare polypectomy x 3 Indications:              High risk colon cancer surveillance: Personal                            history of multiple (3 or more) adenomas. Previous                            examinations 2005 and 2011 with small tubular                            adenomatous Medicines:                Monitored Anesthesia Care Procedure:                Pre-Anesthesia Assessment:                           - Prior to the procedure, a History and Physical                            was performed, and patient medications and                            allergies were reviewed. The patient's tolerance of                            previous anesthesia was also reviewed. The risks                            and benefits of the procedure and the sedation                            options and risks were discussed with the patient.                            All questions were answered, and informed consent                            was obtained. Prior Anticoagulants: The patient has                            taken no previous anticoagulant or antiplatelet                            agents. ASA Grade Assessment: II - A patient with                            mild systemic disease. After reviewing the risks  and benefits, the patient was deemed in                            satisfactory condition to undergo the procedure.                           After obtaining informed consent, the colonoscope                            was passed under direct vision. Throughout the                            procedure, the patient's blood pressure, pulse, and                            oxygen saturations were monitored continuously. The                             Model CF-HQ190L [id number]) scope was introduced                            through the anus and advanced to the the cecum,                            identified by appendiceal orifice and ileocecal                            valve. The ileocecal valve, appendiceal orifice,                            and rectum were photographed. The quality of the                            bowel preparation was excellent. The colonoscopy                            was performed without difficulty. The patient                            tolerated the procedure well. The bowel preparation                            used was SUPREP. Scope In: 1:21:12 PM Scope Out: 1:38:20 PM Scope Withdrawal Time: 0 hours 13 minutes 23 seconds  Total Procedure Duration: 0 hours 17 minutes 8 seconds  Findings:                 Three polyps were found in the sigmoid colon and                            transverse colon. The polyps were 3 mm in size.                            These polyps were removed with a cold snare.  Resection and retrieval were complete.                           Multiple diverticula were found in the sigmoid                            colon.                           Internal hemorrhoids were found during retroflexion.                           The exam was otherwise without abnormality on                            direct and retroflexion views. Complications:            No immediate complications. Estimated blood loss:                            None. Estimated Blood Loss:     Estimated blood loss: none. Impression:               - Three 3 mm polyps in the sigmoid colon and in the                            transverse colon, removed with a cold snare.                            Resected and retrieved.                           - Diverticulosis in the sigmoid colon.                           - Internal hemorrhoids.                           - The  examination was otherwise normal on direct                            and retroflexion views. Recommendation:           - Repeat colonoscopy in 5 years for surveillance.                           - Await pathology results. Docia Chuck. Henrene Pastor, MD 10/01/2015 1:42:56 PM This report has been signed electronically. CC Letter to:             Ravisankar Avva

## 2015-10-01 NOTE — Progress Notes (Signed)
Report to PACU, RN, vss, BBS= Clear.  

## 2015-10-01 NOTE — Patient Instructions (Signed)
YOU HAD AN ENDOSCOPIC PROCEDURE TODAY AT THE [facility]:   Refer to the procedure report that was given to you for any specific questions about what was found during the examination.  If the procedure report does not answer your questions, please call your gastroenterologist to clarify.  If you requested that your care partner not be given the details of your procedure findings, then the procedure report has been included in a sealed envelope for you to review at your convenience later.  YOU SHOULD EXPECT: Some feelings of bloating in the abdomen. Passage of more gas than usual.  Walking can help get rid of the air that was put into your GI tract during the procedure and reduce the bloating. If you had a lower endoscopy (such as a colonoscopy or flexible sigmoidoscopy) you may notice spotting of blood in your stool or on the toilet paper. If you underwent a bowel prep for your procedure, you may not have a normal bowel movement for a few days.  Please Note:  You might notice some irritation and congestion in your nose or some drainage.  This is from the oxygen used during your procedure.  There is no need for concern and it should clear up in a day or so.  SYMPTOMS TO REPORT IMMEDIATELY:   Following lower endoscopy (colonoscopy or flexible sigmoidoscopy):  Excessive amounts of blood in the stool  Significant tenderness or worsening of abdominal pains  Swelling of the abdomen that is new, acute  Fever of 100F or higher   For urgent or emergent issues, a gastroenterologist can be reached at any hour by calling [phone].   DIET: Your first meal following the procedure should be a small meal and then it is ok to progress to your normal diet. Heavy or fried foods are harder to digest and may make you feel nauseous or bloated.  Likewise, meals heavy in dairy and vegetables can increase bloating.  Drink plenty of fluids but you should avoid alcoholic beverages for 24 hours. Try to  increase the fiber in your diet, and drink plenty of water.  ACTIVITY:  You should plan to take it easy for the rest of today and you should NOT DRIVE or use heavy machinery until tomorrow (because of the sedation medicines used during the test).    FOLLOW UP: Our staff will call the number listed on your records the next business day following your procedure to check on you and address any questions or concerns that you may have regarding the information given to you following your procedure. If we do not reach you, we will leave a message.  However, if you are feeling well and you are not experiencing any problems, there is no need to return our call.  We will assume that you have returned to your regular daily activities without incident.  If any biopsies were taken you will be contacted by phone or by letter within the next 1-3 weeks.  Please call us at [phone] if you have not heard about the biopsies in 3 weeks.    SIGNATURES/CONFIDENTIALITY: You and/or your care partner have signed paperwork which will be entered into your electronic medical record.  These signatures attest to the fact that that the information above on your After Visit Summary has been reviewed and is understood.  Full responsibility of the confidentiality of this discharge information lies with you and/or your care-partner.  Read all of the handouts given to you by your recovery   room nurse.  Thank-you for choosing us for your healthcare needs today. 

## 2015-10-01 NOTE — Progress Notes (Signed)
Called to room to assist during endoscopic procedure.  Patient ID and intended procedure confirmed with present staff. Received instructions for my participation in the procedure from the performing physician.  

## 2015-10-02 ENCOUNTER — Telehealth: Payer: Self-pay | Admitting: *Deleted

## 2015-10-02 NOTE — Telephone Encounter (Signed)
  Follow up Call-  Call back number 10/01/2015  Post procedure Call Back phone  # [phone]  Permission to leave phone message Yes     Patient questions:  Do you have a fever, pain , or abdominal swelling? No. Pain Score  0 *  Have you tolerated food without any problems? Yes.    Have you been able to return to your normal activities? Yes.    Do you have any questions about your discharge instructions: Diet   No. Medications  No. Follow up visit  No.  Do you have questions or concerns about your Care? No.  Actions: * If pain score is 4 or above: No action needed, pain <4.

## 2015-10-08 ENCOUNTER — Encounter: Payer: Self-pay | Admitting: Internal Medicine

## 2015-11-14 ENCOUNTER — Ambulatory Visit: Payer: Medicare Other | Attending: Internal Medicine | Admitting: Physical Therapy

## 2015-11-14 ENCOUNTER — Encounter: Payer: Self-pay | Admitting: Physical Therapy

## 2015-11-14 DIAGNOSIS — M25551 Pain in right hip: Secondary | ICD-10-CM | POA: Diagnosis not present

## 2015-11-14 DIAGNOSIS — R262 Difficulty in walking, not elsewhere classified: Secondary | ICD-10-CM

## 2015-11-14 DIAGNOSIS — M6281 Muscle weakness (generalized): Secondary | ICD-10-CM | POA: Diagnosis present

## 2015-11-14 NOTE — Therapy (Signed)
[place], [place], [address] Phone: [phone]   Fax:  [phone]  Physical Therapy Evaluation  Patient Details  Name: John Holt MRN: [id number] Date of Birth: 10/21/1945 Referring Provider: Tivis Ringer MD  Encounter Date: 11/14/2015      PT End of Session - 11/14/15 1304    Visit Number 1   Number of Visits 13   Date for PT Re-Evaluation 12/12/15  30 day re-eval for medicare   Authorization Type BCBS medicare Kx at visit 15   PT Start Time [id number]   PT Stop Time 1230   PT Time Calculation (min) 45 min   Activity Tolerance Patient limited by pain   Behavior During Therapy [facility] for tasks assessed/performed      Past Medical History  Diagnosis Date  . History of closed head injury     2000-- fell off ladder--  temporary memory loss resolved  . History of psychosis     x2  last one documented 2006  w/ hallucination  and suicide ideation  . Bipolar 1 disorder, mixed ([place])     w/ hx psychosis/ dissociative reactions/  suicide ideations and attempt  . Temporal lobe epilepsy syndrome [facility]) neurologist-  dr Leta Baptist--  Linden Dolin one every 2 weeks "legs jerking around, per wife , pt unaware he's doing this"    localization-related right temporal lobe --  mostly nocturnal w/ bilateral lower extremitity movement, staring and moaning then dissorietation afterwards (last daytime seizure June 2013)  . Sigmoid diverticulosis   . History of adenomatous polyp of colon   . BPH (benign prostatic hypertrophy) with urinary obstruction   . Urge urinary incontinence   . Frequency of urination   . Vesicoureteral reflux, bilateral   . Anxiety disorder   . History of panic attacks   . Nocturia   . Wears glasses   . Seizures [facility])     Past Surgical History  Procedure Laterality Date  . Inguinal hernia repair Bilateral 04/15/2013    Procedure: LAPAROSCOPIC BILATERAL INGUINAL HERNIA REPAIR;  Surgeon: Gayland Curry, MD;  Location: [facility];  Service: General;  Laterality: Bilateral;  . Umbilical hernia repair N/A 04/15/2013    Procedure: OPEN HERNIA REPAIR UMBILICAL ADULT;  Surgeon: Gayland Curry, MD;  Location: [facility];  Service: General;  Laterality: N/A;  . Insertion of mesh N/A 04/15/2013    Procedure: INSERTION OF MESH;  Surgeon: Gayland Curry, MD;  Location: [facility];  Service: General;  Laterality: N/A;  . Colonoscopy w/ polypectomy  09-11-2009  . Green light laser turp (transurethral resection of prostate N/A 04/07/2014    Procedure: GREEN LIGHT LASER TURP (TRANSURETHRAL RESECTION OF PROSTATE;  Surgeon: Ailene Rud, MD;  Location: [facility];  Service: Urology;  Laterality: N/A;  . Polypectomy    . Colonoscopy      There were no vitals filed for this visit.       Subjective Assessment - 11/14/15 1151    Subjective Began walking program for exercise. Began feeling pain in R buttocks and R posterior leg. Tried increasing walking to decrease pain. Slipped and fell about 5 weeks ago onto buttocks.    How long can you walk comfortably? 15 min   Patient Stated Goals walk, stairs/step   Currently in Pain? Yes   Pain Score 1   5/10 after 15 min walking   Pain Location Buttocks   Pain Orientation Right   Pain Descriptors /  Indicators Sharp  bruising   Pain Radiating Towards was radiating to R leg, has centralized   Aggravating Factors  walking            [facility] PT Assessment - 11/14/15 0001    Assessment   Medical Diagnosis hamstring tendonitis   Referring Provider Ravisankar R Avva MD   Hand Dominance Left   Prior Therapy no   Precautions   Precautions None   Restrictions   Weight Bearing Restrictions No   Balance Screen   Has the patient fallen in the past 6 months Yes   How many times? [place] residence   Type of [place] Two level   Prior Function   Level of [place] Retired   Associate Professor   Overall Cognitive Status Within Functional Limits for tasks assessed   Observation/Other Assessments   Other Surveys  Other Surveys   Lower Extremity Functional Scale  55/80   ROM / Strength   AROM / PROM / Strength Strength   Strength   Strength Assessment Site Hip;Knee   Right/Left Hip Right   Right Hip Flexion 3+/5   Right Hip ABduction 3-/5   Right/Left Knee Right   Palpation   Palpation comment concordant pain at lateral attachment of piriformis   Ambulation/Gait   Pre-Gait Activities unable to demonstrate SLS on RLE   Gait Comments antalgic gait on RLE                   [facility] Adult PT Treatment/Exercise - 11/14/15 0001    Exercises   Exercises Knee/Hip   Knee/Hip Exercises: Standing   Hip Abduction 20 reps   Modalities   Modalities Ultrasound   Ultrasound   Ultrasound Location R lateral hip external rotator attachments   Ultrasound Parameters 50% continuous 1.0 w/cm2   Ultrasound Goals Pain   Manual Therapy   Manual Therapy Taping   Kinesiotex Facilitate Muscle   Kinesiotix   Facilitate Muscle  piriformis                 PT Education - 11/14/15 1304    Education provided Yes   Education Details anatomy of condition, POC, HEP, ultrasound and K-tape   Person(s) Educated Patient   Methods Explanation;Demonstration;Tactile cues;Verbal cues   Comprehension Verbalized understanding;Returned demonstration;Verbal cues required;Tactile cues required;Need further instruction          PT Short Term Goals - 11/14/15 1315    PT SHORT TERM GOAL #1   Title Pt will be independent with HEP as it has been established by 7/27   Time 1   Period Weeks   Status New           PT Long Term Goals - 11/14/15 1316    PT LONG TERM GOAL #1   Title Pt will be able to walk for at least 30 minutes without pain in hip to return to exercises by 9/27   Baseline unable >15 min   Time 10   Period Weeks   Status New   PT LONG  TERM GOAL #2   Title Pt will be able to climb stairs at home with appropriate step over step pattern   Baseline leading with L for each step at eval   Time 10   Period Weeks   Status New   PT LONG TERM GOAL #3   Title Pt will be able to squat  appropriately to grab and lift objects from floor pain <2/10   Baseline quite a bit of difficulty at eval   Time 10   Period Weeks   Status New   PT LONG TERM GOAL #4   Title LEFS score to at least 64/80   Baseline 55/80 at eval    MDC 9 points   Time 10   Period Weeks   Status New               Plan - 11/14/15 1308    Clinical Impression Statement Pt presents to PT with complaints of buttock pain that worsens with walking and has decreased since stopping walking. Pt has notable trigger points in piriformis and severe pain and TTP at lateral piriformis attachment. Unable to lay in sidelying and actively abduct R leg. Suspected tear of lateral piriformis attachement. Utilized ultrasound and kinesiotape and will re-evaluate effectiveness at next appointlment. Pt is going on vacation on the 28th.    Rehab Potential Fair   PT Frequency 2x / week   PT Duration Other (comment)  10 weeks   PT Treatment/Interventions ADLs/Self Care Home Management;Cryotherapy;Electrical Stimulation;Ultrasound;Moist Heat;Iontophoresis 4mg /ml Dexamethasone;Gait training;Stair training;Functional mobility training;Therapeutic activities;Therapeutic exercise;Patient/family education;Neuromuscular re-education;Manual techniques;Taping;Dry needling;Passive range of motion   PT Next Visit Plan re-evaluate effectiveness of ultrasound, ionto?   PT Home Exercise Plan standing hip abd to activate glut med, ice 2-3/day 15 min ea   Consulted and Agree with Plan of Care Patient      Patient will benefit from skilled therapeutic intervention in order to improve the following deficits and impairments:  Abnormal gait, Pain, Improper body mechanics, Increased muscle spasms,  Decreased activity tolerance, Decreased strength, Difficulty walking  Visit Diagnosis: Pain in right hip - Plan: PT plan of care cert/re-cert  Difficulty in walking, not elsewhere classified - Plan: PT plan of care cert/re-cert  Muscle weakness (generalized) - Plan: PT plan of care cert/re-cert      G-Codes - [id number] 1319    Functional Assessment Tool Used LEFS 32% disability (55/80), clinical judgement   Functional Limitation Mobility: Walking and moving around   Mobility: Walking and Moving Around Current Status [id number]) At least 20 percent but less than 40 percent impaired, limited or restricted   Mobility: Walking and Moving Around Goal Status [id number]) At least 1 percent but less than 20 percent impaired, limited or restricted       Problem List Patient Active Problem List   Diagnosis Date Noted  . Localization-related epilepsy ([place]) 08/11/2012    Kasondra Junod C. Bryant Saye PT, DPT 11/14/2015 1:22 PM   [place], [place], [address] Phone: [phone]   Fax:  [phone]  Name: John Holt MRN: [id number] Date of Birth: 28-Jun-1945

## 2015-11-15 ENCOUNTER — Ambulatory Visit: Payer: Medicare Other | Admitting: Physical Therapy

## 2015-11-15 ENCOUNTER — Encounter: Payer: Self-pay | Admitting: Physical Therapy

## 2015-11-15 DIAGNOSIS — M25551 Pain in right hip: Secondary | ICD-10-CM | POA: Diagnosis not present

## 2015-11-15 DIAGNOSIS — M6281 Muscle weakness (generalized): Secondary | ICD-10-CM

## 2015-11-15 DIAGNOSIS — R262 Difficulty in walking, not elsewhere classified: Secondary | ICD-10-CM

## 2015-11-15 NOTE — Therapy (Signed)
[place], [place], [address] Phone: [phone]   Fax:  [phone]  Physical Therapy Treatment  Patient Details  Name: John Holt MRN: [id number] Date of Birth: Sep 24, 1945 Referring Provider: Tivis Ringer MD  Encounter Date: 11/15/2015      PT End of Session - 11/15/15 1501    Visit Number 2   Number of Visits 13   Date for PT Re-Evaluation 12/12/15   Authorization Type BCBS medicare Kx at visit 15   PT Start Time 1502   PT Stop Time 1540   PT Time Calculation (min) 38 min   Activity Tolerance Patient tolerated treatment well   Behavior During Therapy [facility] for tasks assessed/performed      Past Medical History  Diagnosis Date  . History of closed head injury     2000-- fell off ladder--  temporary memory loss resolved  . History of psychosis     x2  last one documented 2006  w/ hallucination  and suicide ideation  . Bipolar 1 disorder, mixed ([place])     w/ hx psychosis/ dissociative reactions/  suicide ideations and attempt  . Temporal lobe epilepsy syndrome [facility]) neurologist-  dr Leta Baptist--  Linden Dolin one every 2 weeks "legs jerking around, per wife , pt unaware he's doing this"    localization-related right temporal lobe --  mostly nocturnal w/ bilateral lower extremitity movement, staring and moaning then dissorietation afterwards (last daytime seizure June 2013)  . Sigmoid diverticulosis   . History of adenomatous polyp of colon   . BPH (benign prostatic hypertrophy) with urinary obstruction   . Urge urinary incontinence   . Frequency of urination   . Vesicoureteral reflux, bilateral   . Anxiety disorder   . History of panic attacks   . Nocturia   . Wears glasses   . Seizures [facility])     Past Surgical History  Procedure Laterality Date  . Inguinal hernia repair Bilateral 04/15/2013    Procedure: LAPAROSCOPIC BILATERAL INGUINAL HERNIA REPAIR;  Surgeon: Gayland Curry, MD;  Location:  [facility];  Service: General;  Laterality: Bilateral;  . Umbilical hernia repair N/A 04/15/2013    Procedure: OPEN HERNIA REPAIR UMBILICAL ADULT;  Surgeon: Gayland Curry, MD;  Location: [facility];  Service: General;  Laterality: N/A;  . Insertion of mesh N/A 04/15/2013    Procedure: INSERTION OF MESH;  Surgeon: Gayland Curry, MD;  Location: [facility];  Service: General;  Laterality: N/A;  . Colonoscopy w/ polypectomy  09-11-2009  . Green light laser turp (transurethral resection of prostate N/A 04/07/2014    Procedure: GREEN LIGHT LASER TURP (TRANSURETHRAL RESECTION OF PROSTATE;  Surgeon: Ailene Rud, MD;  Location: [facility];  Service: Urology;  Laterality: N/A;  . Polypectomy    . Colonoscopy      There were no vitals filed for this visit.      Subjective Assessment - 11/15/15 1501    Subjective Improved ability to climb stairs and perform household chores.    Currently in Pain? Yes   Pain Score 2    Pain Location Buttocks   Pain Orientation Right   Pain Descriptors / Indicators Sore                         [facility] Adult PT Treatment/Exercise - 11/15/15 0001    Knee/Hip Exercises: Aerobic   Stationary Bike 5 min L3   Knee/Hip Exercises: Standing  Hip Extension 2 sets;10 reps   Extension Limitations quadruped hip extension knee flexed and hip IR   Other Standing Knee Exercises standing pelvic tilt and posutural training    Knee/Hip Exercises: Supine   Bridges with Ball Squeeze 2 sets;10 reps   Ultrasound   Ultrasound Location R lateral hip external rotator attachments   Ultrasound Parameters 50% continuous 1.0 w/cm2   Ultrasound Goals Pain   Kinesiotix   Facilitate Muscle  piriformis                 PT Education - 11/15/15 1542    Education provided Yes   Education Details exercise form/rationale, HEP   Person(s) Educated Patient   Methods Explanation;Demonstration;Tactile cues;Verbal cues;Handout   Comprehension Verbalized  understanding;Returned demonstration;Verbal cues required;Tactile cues required;Need further instruction          PT Short Term Goals - Dec 03, 2015 1315    PT SHORT TERM GOAL #1   Title Pt will be independent with HEP as it has been established by 7/27   Time 1   Period Weeks   Status New           PT Long Term Goals - 03-Dec-2015 1316    PT LONG TERM GOAL #1   Title Pt will be able to walk for at least 30 minutes without pain in hip to return to exercises by 9/27   Baseline unable >15 min   Time 10   Period Weeks   Status New   PT LONG TERM GOAL #2   Title Pt will be able to climb stairs at home with appropriate step over step pattern   Baseline leading with L for each step at eval   Time 10   Period Weeks   Status New   PT LONG TERM GOAL #3   Title Pt will be able to squat appropriately to grab and lift objects from floor pain <2/10   Baseline quite a bit of difficulty at eval   Time 10   Period Weeks   Status New   PT LONG TERM GOAL #4   Title LEFS score to at least 64/80   Baseline 55/80 at eval    MDC 9 points   Time 10   Period Weeks   Status New               Plan - 11/15/15 1542    Clinical Impression Statement Pt verbalized significant improvements in pain levels and reported feeling "great" upon leaving therapy. Focus on activating glut max to decrease dependence on piriformis.    PT Next Visit Plan ultrasound, ability to perform hip abd actively, teach wife piriformis ktape technique, avoiding stretching of hip external rotators   Consulted and Agree with Plan of Care Patient      Patient will benefit from skilled therapeutic intervention in order to improve the following deficits and impairments:     Visit Diagnosis: Pain in right hip  Difficulty in walking, not elsewhere classified  Muscle weakness (generalized)       G-Codes - 12/03/2015 1319    Functional Assessment Tool Used LEFS 32% disability (55/80), clinical judgement   Functional  Limitation Mobility: Walking and moving around   Mobility: Walking and Moving Around Current Status [id number]) At least 20 percent but less than 40 percent impaired, limited or restricted   Mobility: Walking and Moving Around Goal Status [id number]) At least 1 percent but less than 20 percent impaired, limited or restricted  Problem List Patient Active Problem List   Diagnosis Date Noted  . Localization-related epilepsy ([place]) 08/11/2012    Seraiah Nowack C. Mialani Reicks PT, DPT 11/15/2015 3:45 PM   [facility] [address] [place], [place], [address] Phone: [phone]   Fax:  [phone]  Name: John Holt MRN: [id number] Date of Birth: 12/08/1945

## 2015-11-15 NOTE — Patient Instructions (Signed)
Access Code: [email or web address]  URL: [email or web address]  Date: 11/15/2015  Prepared by: Selinda Eon   Exercises  Quadruped Hip Extension Kicks - 10 reps - 2 sets - 2s hold - 1x daily - 7x weekly  Supine Bridge with Mini Swiss Ball Between Knees - 10 reps - 2 sets - 5s hold - 1x daily - 7x weekly  Standing Posterior Pelvic Tilt

## 2015-11-21 ENCOUNTER — Ambulatory Visit: Payer: Medicare Other | Admitting: Physical Therapy

## 2015-11-21 DIAGNOSIS — M6281 Muscle weakness (generalized): Secondary | ICD-10-CM

## 2015-11-21 DIAGNOSIS — R262 Difficulty in walking, not elsewhere classified: Secondary | ICD-10-CM

## 2015-11-21 DIAGNOSIS — M25551 Pain in right hip: Secondary | ICD-10-CM

## 2015-11-21 NOTE — Patient Instructions (Addendum)
Hip Internal Rotation: Resisted    Sit with band loop around right ankle, anchor on other side. Keeping thigh flat and knee bent at right angle, pull ankle away from body. Repeat __10__ times per set. Do __2__ sets per session. Do _2___ sessions per day.  Abduction: Clam (Eccentric) - Side-Lying    Lie on side with knees bent. Lift top knee, keeping feet together. Keep trunk steady. Slowly lower for 3-5 seconds. _10__ reps per set, __2_ sets per day, _7__ days per week. Add ___ lbs when you achieve ___ repetitions.  Bridging    Slowly raise buttocks from floor, keeping stomach tight. Repeat _10___ times per set. Do __2__ sets per session. Do __2__ sessions per day.  Straight Leg Raise    Tighten stomach and slowly raise locked right leg __12__ inches from floor. Repeat ___10_ times per set. Do __2__ sets per session. Do ___2_ sessions per day.  ABDUCTION: Standing (Active)    Stand, feet flat. Lift right leg out to side. Use __0_ lbs. Complete __2_ sets of __10_ repetitions. Perform _2__ sessions per day.  Pelvic Tilt    Flatten back by tightening stomach muscles and buttocks. Repeat __10__ times per set. Do _2___ sets per session. Do __2__ sessions per day.

## 2015-11-21 NOTE — Therapy (Addendum)
[place], [place], [address] Phone: [phone]   Fax:  [phone]  Physical Therapy Treatment  Patient Details  Name: John Holt MRN: [id number] Date of Birth: Nov 04, 1945 Referring Provider: Tivis Ringer MD  Encounter Date: 11/21/2015      PT End of Session - 11/21/15 1118    Visit Number 3   Number of Visits 13   Date for PT Re-Evaluation 12/12/15   Authorization Type BCBS medicare Kx at visit 15   PT Start Time 1015   PT Stop Time 1100   PT Time Calculation (min) 45 min      Past Medical History:  Diagnosis Date  . Anxiety disorder   . Bipolar 1 disorder, mixed ([place])    w/ hx psychosis/ dissociative reactions/  suicide ideations and attempt  . BPH (benign prostatic hypertrophy) with urinary obstruction   . Frequency of urination   . History of adenomatous polyp of colon   . History of closed head injury    2000-- fell off ladder--  temporary memory loss resolved  . History of panic attacks   . History of psychosis    x2  last one documented 2006  w/ hallucination  and suicide ideation  . Nocturia   . Seizures ([place])   . Sigmoid diverticulosis   . Temporal lobe epilepsy syndrome [facility]) neurologist-  dr Leta Baptist--  Linden Dolin one every 2 weeks "legs jerking around, per wife , pt unaware he's doing this"   localization-related right temporal lobe --  mostly nocturnal w/ bilateral lower extremitity movement, staring and moaning then dissorietation afterwards (last daytime seizure June 2013)  . Urge urinary incontinence   . Vesicoureteral reflux, bilateral   . Wears glasses     Past Surgical History:  Procedure Laterality Date  . COLONOSCOPY    . COLONOSCOPY W/ POLYPECTOMY  09-11-2009  . GREEN LIGHT LASER TURP (TRANSURETHRAL RESECTION OF PROSTATE N/A 04/07/2014   Procedure: GREEN LIGHT LASER TURP (TRANSURETHRAL RESECTION OF PROSTATE;  Surgeon: Ailene Rud, MD;  Location:  [facility];  Service: Urology;  Laterality: N/A;  . INGUINAL HERNIA REPAIR Bilateral 04/15/2013   Procedure: LAPAROSCOPIC BILATERAL INGUINAL HERNIA REPAIR;  Surgeon: Gayland Curry, MD;  Location: [facility];  Service: General;  Laterality: Bilateral;  . INSERTION OF MESH N/A 04/15/2013   Procedure: INSERTION OF MESH;  Surgeon: Gayland Curry, MD;  Location: [facility];  Service: General;  Laterality: N/A;  . POLYPECTOMY    . UMBILICAL HERNIA REPAIR N/A 04/15/2013   Procedure: OPEN HERNIA REPAIR UMBILICAL ADULT;  Surgeon: Gayland Curry, MD;  Location: [facility];  Service: General;  Laterality: N/A;    There were no vitals filed for this visit.      Subjective Assessment - 11/21/15 1019    Subjective I am not in as much pain. I notice it when I pivot on that leg or prolonged walking.    Currently in Pain? No/denies   Aggravating Factors  prolonged walking, pivoting; alleviating factors: pain meds, ice            [facility] PT Assessment - 11/21/15 0001      Strength   Right Hip External Rotation  4/5   Right Hip Internal Rotation 3+/5   Right Hip ABduction 3/5                     [facility] Adult PT Treatment/Exercise - 11/21/15 0001  Knee/Hip Exercises: Standing   Hip Abduction 20 reps   Extension Limitations quadruped hip extension knee flexed and hip IR  also hip extensionx 10 in quardruped   SLS 10 sec best with discomfort   Other Standing Knee Exercises standing pelvic tilt and posutural training   unable to perfrom posterior pelvic tilt, moved to supine     Knee/Hip Exercises: Seated   Other Seated Knee/Hip Exercises seated yellow band hip IR 10 x 2     Knee/Hip Exercises: Supine   Bridges Limitations x10   Straight Leg Raises 10 reps   Other Supine Knee/Hip Exercises pelvic tilt x 10     Knee/Hip Exercises: Sidelying   Hip ABduction --  3 reps    Clams x10  unable to reverse clam                PT Education - 11/21/15 1041     Education provided Yes   Education Details HEP   Person(s) Educated Patient   Methods Explanation;Handout   Comprehension Verbalized understanding          PT Short Term Goals - 11/21/15 1021      PT SHORT TERM GOAL #1   Title Pt will be independent with HEP as it has been established by 7/27   Period Weeks   Status Achieved           PT Long Term Goals - 11/21/15 1021      PT LONG TERM GOAL #1   Title Pt will be able to walk for at least 30 minutes without pain in hip to return to exercises by 9/27   Baseline mild increase after 15 minutes   Time 10   Period Weeks   Status On-going     PT LONG TERM GOAL #2   Title Pt will be able to climb stairs at home with appropriate step over step pattern   Time 10   Period Weeks   Status Achieved     PT LONG TERM GOAL #3   Title Pt will be able to squat appropriately to grab and lift objects from floor pain <2/10   Baseline 1/10 with full squat   Time 10   Period Weeks   Status Achieved     PT LONG TERM GOAL #4   Title LEFS score to at least 64/80   Time 10   Period Weeks   Status Unable to assess               Plan - 11/21/15 1110    Clinical Impression Statement Pt presents with significant improvement in pain. He is able to complete full squat with 1/10 pain. She is climbing reciprocal stairs without pain. He notices increased discmonfort after walking greater than 15 minutes. MMT revels slight improvement in hip abduction strength. Painful with side lying hip abduction. No pain with clams however unable to perfrom IR in this position and it causes increased pain. Hip IR in sitting not painful and able to perform with yellow band without pain. Updated HEP. Pt leaving for vacation Friday and wishes to cancel is appointment tomorrow. He plan to perform HEP on vacation to [place] and will return in 2 weeks. No overall increased pain after tx. STG#1 Met, LTG# 2,#3 Met.    PT Next Visit Plan ultrasound, ability to  perform hip abd actively, teach wife piriformis ktape technique, avoiding stretching of hip external rotators; review new HEP   PT Home Exercise Plan standing hip  abd to activate glut med, ice 2-3/day 15 min ea, posterior pelvic tilt wall vs supine, bridge, clam, SLR, seated hip IR with yellow band      Patient will benefit from skilled therapeutic intervention in order to improve the following deficits and impairments:  Abnormal gait, Pain, Improper body mechanics, Increased muscle spasms, Decreased activity tolerance, Decreased strength, Difficulty walking  Visit Diagnosis: Pain in right hip  Difficulty in walking, not elsewhere classified  Muscle weakness (generalized)     Problem List Patient Active Problem List   Diagnosis Date Noted  . Localization-related epilepsy ([place]) 08/11/2012    Dorene Ar, PTA 11/21/2015, 11:19 AM  [place], [place], [address] Phone: [phone]   Fax:  [phone]  Name: John Holt MRN: [id number] Date of Birth: 1945/07/27

## 2015-11-22 ENCOUNTER — Encounter: Payer: Medicare Other | Admitting: Physical Therapy

## 2015-12-10 ENCOUNTER — Ambulatory Visit: Payer: Medicare Other | Attending: Internal Medicine | Admitting: Physical Therapy

## 2015-12-10 DIAGNOSIS — R262 Difficulty in walking, not elsewhere classified: Secondary | ICD-10-CM | POA: Insufficient documentation

## 2015-12-10 DIAGNOSIS — M6281 Muscle weakness (generalized): Secondary | ICD-10-CM | POA: Insufficient documentation

## 2015-12-10 DIAGNOSIS — M25551 Pain in right hip: Secondary | ICD-10-CM | POA: Insufficient documentation

## 2015-12-13 ENCOUNTER — Ambulatory Visit: Payer: Medicare Other | Admitting: Physical Therapy

## 2015-12-13 ENCOUNTER — Encounter: Payer: Self-pay | Admitting: Physical Therapy

## 2015-12-13 DIAGNOSIS — R262 Difficulty in walking, not elsewhere classified: Secondary | ICD-10-CM

## 2015-12-13 DIAGNOSIS — M25551 Pain in right hip: Secondary | ICD-10-CM | POA: Diagnosis present

## 2015-12-13 DIAGNOSIS — M6281 Muscle weakness (generalized): Secondary | ICD-10-CM | POA: Diagnosis present

## 2015-12-13 NOTE — Patient Instructions (Signed)
Access Code: [email or web address]  URL: [email or web address]  Date: 12/13/2015  Prepared by: Selinda Eon   Exercises  Sidelying Hip Abduction - 10 reps - 3 sets - 3 hold - 1x daily - 7x weekly  Supine Bridge - 10 reps - 3 sets - 5 hold - 1x daily - 7x weekly  Supine Bridge with Leg Extension - 10 reps - 3 sets - 3 hold - 1x daily - 7x weekly  Supine Hamstring Stretch - 10 reps - 1 sets - 5 hold - 1x daily - 7x weekly  Supine Piriformis Stretch - 3 reps - 1 sets - 30 hold - 1x daily - 7x weekly

## 2015-12-13 NOTE — Therapy (Signed)
[place], [place], [address] Phone: [phone]   Fax:  [phone]  Physical Therapy Treatment/Discharge Summary  Patient Details  Name: John Holt MRN: [id number] Date of Birth: [birth year] Referring Provider: Tivis Ringer MD  Encounter Date: 12/13/2015      PT End of Session - 12/13/15 1122    Visit Number 4   Number of Visits 13   Date for PT Re-Evaluation 12/12/15   Authorization Type BCBS medicare Kx at visit 15   PT Start Time 1101   PT Stop Time 1125   PT Time Calculation (min) 24 min   Activity Tolerance Patient tolerated treatment well   Behavior During Therapy [facility] for tasks assessed/performed      Past Medical History:  Diagnosis Date  . Anxiety disorder   . Bipolar 1 disorder, mixed ([place])    w/ hx psychosis/ dissociative reactions/  suicide ideations and attempt  . BPH (benign prostatic hypertrophy) with urinary obstruction   . Frequency of urination   . History of adenomatous polyp of colon   . History of closed head injury    2000-- fell off ladder--  temporary memory loss resolved  . History of panic attacks   . History of psychosis    x2  last one documented 2006  w/ hallucination  and suicide ideation  . Nocturia   . Seizures ([place])   . Sigmoid diverticulosis   . Temporal lobe epilepsy syndrome [facility]) neurologist-  dr Leta Baptist--  Linden Dolin one every 2 weeks "legs jerking around, per wife , pt unaware he's doing this"   localization-related right temporal lobe --  mostly nocturnal w/ bilateral lower extremitity movement, staring and moaning then dissorietation afterwards (last daytime seizure June 2013)  . Urge urinary incontinence   . Vesicoureteral reflux, bilateral   . Wears glasses     Past Surgical History:  Procedure Laterality Date  . COLONOSCOPY    . COLONOSCOPY W/ POLYPECTOMY  09-11-2009  . GREEN LIGHT LASER TURP (TRANSURETHRAL RESECTION OF PROSTATE N/A  04/07/2014   Procedure: GREEN LIGHT LASER TURP (TRANSURETHRAL RESECTION OF PROSTATE;  Surgeon: Ailene Rud, MD;  Location: [facility];  Service: Urology;  Laterality: N/A;  . INGUINAL HERNIA REPAIR Bilateral 04/15/2013   Procedure: LAPAROSCOPIC BILATERAL INGUINAL HERNIA REPAIR;  Surgeon: Gayland Curry, MD;  [facility];  Service: General;  Laterality: Bilateral;  . INSERTION OF MESH N/A 04/15/2013   Procedure: INSERTION OF MESH;  Surgeon: Gayland Curry, MD;  [facility];  Service: General;  Laterality: N/A;  . POLYPECTOMY    . UMBILICAL HERNIA REPAIR N/A 04/15/2013   Procedure: OPEN HERNIA REPAIR UMBILICAL ADULT;  Surgeon: Gayland Curry, MD;  [facility];  Service: General;  Laterality: N/A;    There were no vitals filed for this visit.      Subjective Assessment - 12/13/15 1103    Subjective Pt is now able to ride bike without residual soreness and is able to run and climb stairs.    Currently in Pain? No/denies            [facility] PT Assessment - 12/13/15 0001      Strength   Right Hip Flexion 4+/5   Right Hip External Rotation  5/5   Right Hip Internal Rotation 4+/5   Right Hip ABduction 4/5     Palpation   Palpation comment denies TTP     Ambulation/Gait   Gait Comments  [facility]                     [facility] Adult PT Treatment/Exercise - Dec 27, 2015 0001      Knee/Hip Exercises: Supine   Bridges Limitations x10, x10 single leg     Knee/Hip Exercises: Sidelying   Hip ABduction 20 reps   Hip ABduction Limitations with knee flx/ext                PT Education - 12/27/2015 1121    Education provided Yes   Education Details HEP, importance of continued strengthening   Person(s) Educated Patient   Methods Explanation;Demonstration;Tactile cues;Verbal cues;Handout   Comprehension Verbalized understanding;Returned demonstration;Verbal cues required;Tactile cues required          PT Short Term Goals - 11/21/15 1021       PT SHORT TERM GOAL #1   Title Pt will be independent with HEP as it has been established by 7/27   Period Weeks   Status Achieved           PT Long Term Goals - Dec 27, 2015 1104      PT LONG TERM GOAL #1   Title Pt will be able to walk for at least 30 minutes without pain in hip to return to exercises by 9/27   Status Achieved     PT LONG TERM GOAL #2   Title Pt will be able to climb stairs at home with appropriate step over step pattern   Status Achieved     PT LONG TERM GOAL #3   Title Pt will be able to squat appropriately to grab and lift objects from floor pain <2/10   Status Achieved     PT LONG TERM GOAL #4   Title LEFS score to at least 64/80   Baseline 78/80 at d/c   Status Achieved             Patient will benefit from skilled therapeutic intervention in order to improve the following deficits and impairments:     Visit Diagnosis: Pain in right hip  Difficulty in walking, not elsewhere classified  Muscle weakness (generalized)       G-Codes - December 27, 2015 1132    Functional Assessment Tool Used LEFS 78/80   Functional Limitation Mobility: Walking and moving around   Mobility: Walking and Moving Around Goal Status [phone]) At least 1 percent but less than 20 percent impaired, limited or restricted   Mobility: Walking and Moving Around Discharge Status [phone]) At least 1 percent but less than 20 percent impaired, limited or restricted      Problem List Patient Active Problem List   Diagnosis Date Noted  . Localization-related epilepsy [facility]) 08/11/2012    Selinda Eon 2015-12-27, 11:34 AM  [facility] [address] [place], [place], [address] Phone: [phone]   Fax:  [phone]  Name: John Holt MRN: [id number] Date of Birth: Aug 11, 1945   PHYSICAL THERAPY DISCHARGE SUMMARY  Visits from Start of Care: 4  Current functional level related to goals / functional outcomes: See  above   Remaining deficits: See above   Education / Equipment: Anatomy of condition, POC, HEP, exercise form/rationale  Plan: Patient agrees to discharge.  Patient goals were met. Patient is being discharged due to meeting the stated rehab goals.  ?????    Eagan Shifflett C. Olivea Sonnen PT, DPT 27-Dec-2015 11:36 AM

## 2015-12-17 ENCOUNTER — Encounter: Payer: Medicare Other | Admitting: Physical Therapy

## 2015-12-24 ENCOUNTER — Encounter: Payer: Self-pay | Admitting: Physical Therapy

## 2016-01-01 ENCOUNTER — Encounter: Payer: Medicare Other | Admitting: Physical Therapy

## 2016-04-28 DIAGNOSIS — Z95828 Presence of other vascular implants and grafts: Secondary | ICD-10-CM

## 2016-04-28 HISTORY — PX: TRANSURETHRAL RESECTION OF BLADDER: SUR1395

## 2016-04-28 HISTORY — DX: Presence of other vascular implants and grafts: Z95.828

## 2016-04-28 HISTORY — PX: PORTACATH PLACEMENT: SHX2246

## 2016-08-18 DIAGNOSIS — N138 Other obstructive and reflux uropathy: Secondary | ICD-10-CM | POA: Insufficient documentation

## 2016-08-18 DIAGNOSIS — N529 Male erectile dysfunction, unspecified: Secondary | ICD-10-CM | POA: Insufficient documentation

## 2016-10-18 ENCOUNTER — Encounter (HOSPITAL_COMMUNITY): Payer: Self-pay | Admitting: Emergency Medicine

## 2016-10-18 ENCOUNTER — Ambulatory Visit (HOSPITAL_COMMUNITY)
Admission: EM | Admit: 2016-10-18 | Discharge: 2016-10-18 | Disposition: A | Discharge status: Nursing Home (Includes ICF) | Payer: Medicare Other | Source: Home / Self Care

## 2016-10-18 ENCOUNTER — Emergency Department (HOSPITAL_BASED_OUTPATIENT_CLINIC_OR_DEPARTMENT_OTHER)
Admit: 2016-10-18 | Discharge: 2016-10-18 | Disposition: A | Discharge status: Home / Self Care | Payer: Medicare Other | Attending: Emergency Medicine | Admitting: Emergency Medicine

## 2016-10-18 ENCOUNTER — Encounter (HOSPITAL_COMMUNITY): Payer: Self-pay | Admitting: *Deleted

## 2016-10-18 ENCOUNTER — Emergency Department (HOSPITAL_COMMUNITY)
Admission: EM | Admit: 2016-10-18 | Discharge: 2016-10-18 | Disposition: A | Discharge status: Home / Self Care | Payer: Medicare Other | Attending: Emergency Medicine | Admitting: Emergency Medicine

## 2016-10-18 DIAGNOSIS — Z87891 Personal history of nicotine dependence: Secondary | ICD-10-CM | POA: Diagnosis not present

## 2016-10-18 DIAGNOSIS — M79661 Pain in right lower leg: Secondary | ICD-10-CM | POA: Diagnosis not present

## 2016-10-18 DIAGNOSIS — I808 Phlebitis and thrombophlebitis of other sites: Secondary | ICD-10-CM

## 2016-10-18 DIAGNOSIS — M79609 Pain in unspecified limb: Secondary | ICD-10-CM

## 2016-10-18 DIAGNOSIS — M7989 Other specified soft tissue disorders: Secondary | ICD-10-CM

## 2016-10-18 DIAGNOSIS — Z79899 Other long term (current) drug therapy: Secondary | ICD-10-CM | POA: Diagnosis not present

## 2016-10-18 DIAGNOSIS — Z7982 Long term (current) use of aspirin: Secondary | ICD-10-CM | POA: Diagnosis not present

## 2016-10-18 DIAGNOSIS — I8001 Phlebitis and thrombophlebitis of superficial vessels of right lower extremity: Secondary | ICD-10-CM | POA: Insufficient documentation

## 2016-10-18 DIAGNOSIS — M79604 Pain in right leg: Secondary | ICD-10-CM

## 2016-10-18 DIAGNOSIS — R2241 Localized swelling, mass and lump, right lower limb: Secondary | ICD-10-CM | POA: Diagnosis not present

## 2016-10-18 DIAGNOSIS — I809 Phlebitis and thrombophlebitis of unspecified site: Secondary | ICD-10-CM

## 2016-10-18 LAB — CBC WITH DIFFERENTIAL/PLATELET
Basophils Absolute: 0 10*3/uL (ref 0.0–0.1)
Basophils Relative: 0 %
Eosinophils Absolute: 0.2 10*3/uL (ref 0.0–0.7)
Eosinophils Relative: 3 %
HCT: 42.6 % (ref 39.0–52.0)
Hemoglobin: 14.2 g/dL (ref 13.0–17.0)
LYMPHS PCT: 18 %
Lymphs Abs: 1 10*3/uL (ref 0.7–4.0)
MCH: 30.1 pg (ref 26.0–34.0)
MCHC: 33.3 g/dL (ref 30.0–36.0)
MCV: 90.4 fL (ref 78.0–100.0)
MONO ABS: 0.6 10*3/uL (ref 0.1–1.0)
MONOS PCT: 11 %
NEUTROS ABS: 3.6 10*3/uL (ref 1.7–7.7)
Neutrophils Relative %: 68 %
Platelets: 184 10*3/uL (ref 150–400)
RBC: 4.71 MIL/uL (ref 4.22–5.81)
RDW: 14.2 % (ref 11.5–15.5)
WBC: 5.3 10*3/uL (ref 4.0–10.5)

## 2016-10-18 LAB — BASIC METABOLIC PANEL
Anion gap: 6 (ref 5–15)
BUN: 17 mg/dL (ref 6–20)
CALCIUM: 9.1 mg/dL (ref 8.9–10.3)
CO2: 28 mmol/L (ref 22–32)
CREATININE: 0.92 mg/dL (ref 0.61–1.24)
Chloride: 105 mmol/L (ref 101–111)
GFR calc Af Amer: >60 mL/min (ref >60)
GFR calc non Af Amer: >60 mL/min (ref >60)
GLUCOSE: 93 mg/dL (ref 65–99)
Potassium: 4.1 mmol/L (ref 3.5–5.1)
Sodium: 139 mmol/L (ref 135–145)

## 2016-10-18 MED ORDER — ASPIRIN 81 MG PO TBEC: 81.0000 mg | DELAYED_RELEASE_TABLET | Freq: Every day | ORAL | 0 refills | Status: AC

## 2016-10-18 MED ORDER — CEPHALEXIN 500 MG PO CAPS: 500.0000 mg | ORAL_CAPSULE | Freq: Three times a day (TID) | ORAL | 0 refills | Status: AC

## 2016-10-18 MED ORDER — ASPIRIN 81 MG PO CHEW
324.0000 mg | CHEWABLE_TABLET | Freq: Once | ORAL | Status: AC
  Administered 2016-10-18: 324 mg via ORAL
  Filled 2016-10-18: qty 4

## 2016-10-18 MED ORDER — CEPHALEXIN 250 MG PO CAPS
500.0000 mg | ORAL_CAPSULE | Freq: Once | ORAL | Status: AC
  Administered 2016-10-18: 500 mg via ORAL
  Filled 2016-10-18: qty 2

## 2016-10-18 MED ORDER — SULFAMETHOXAZOLE-TRIMETHOPRIM 800-160 MG PO TABS: 1.0000 | ORAL_TABLET | Freq: Two times a day (BID) | ORAL | 0 refills | Status: AC

## 2016-10-18 MED ORDER — SULFAMETHOXAZOLE-TRIMETHOPRIM 800-160 MG PO TABS
1.0000 | ORAL_TABLET | Freq: Once | ORAL | Status: AC
  Administered 2016-10-18: 1 via ORAL
  Filled 2016-10-18: qty 1

## 2016-10-18 NOTE — ED Triage Notes (Signed)
Pt to ER sent from [facility] for further treatment of right upper calf extending to right medial thigh thrombophlebitis. Swollen, tender, and warm on palpation. Pt in NAD. A/o x4.

## 2016-10-18 NOTE — ED Notes (Signed)
Pt in gown on monitor

## 2016-10-18 NOTE — ED Triage Notes (Signed)
2  Days  Ago  Pt  excersised  At      [facility]  And   Woke  Up   Yesterday  With  Pain  And    Swelling  Behind  r  Knee   Tender  To  Touch       Pt  Reports  Pain    In  Vein  Behind  r  Knee     But  Denies  Any  specefic injury

## 2016-10-18 NOTE — ED Notes (Signed)
Vascular tech is aware of patient 

## 2016-10-18 NOTE — Discharge Instructions (Signed)
Take the antibiotics as prescribed  Use warm compresses 4-6 times daily to help symptoms and improve the clot  Take an 81 mg aspirin daily

## 2016-10-18 NOTE — ED Provider Notes (Signed)
CSN: [id number]     Arrival date & time 10/18/16  1555 History   None    Chief Complaint  Patient presents with  . Leg Swelling   (Consider location/radiation/quality/duration/timing/severity/associated sxs/prior Treatment) Patient c/o right thigh and leg pain for 2 days.  Patient c/o right leg vein behind knee is swollen and tender.   The history is provided by the patient.  Leg Pain  Location:  Leg Leg location:  R leg Pain details:    Quality:  Aching   Radiates to:  R leg   Severity:  Moderate   Onset quality:  Sudden   Duration:  2 days   Timing:  Constant   Progression:  Worsening Chronicity:  New Dislocation: no   Prior injury to area:  No   Past Medical History:  Diagnosis Date  . Anxiety disorder   . Bipolar 1 disorder, mixed ([place])    w/ hx psychosis/ dissociative reactions/  suicide ideations and attempt  . BPH (benign prostatic hypertrophy) with urinary obstruction   . Frequency of urination   . History of adenomatous polyp of colon   . History of closed head injury    2000-- fell off ladder--  temporary memory loss resolved  . History of panic attacks   . History of psychosis    x2  last one documented 2006  w/ hallucination  and suicide ideation  . Nocturia   . Seizures ([place])   . Sigmoid diverticulosis   . Temporal lobe epilepsy syndrome [facility]) neurologist-  dr Leta Baptist--  Linden Dolin one every 2 weeks "legs jerking around, per wife , pt unaware he's doing this"   localization-related right temporal lobe --  mostly nocturnal w/ bilateral lower extremitity movement, staring and moaning then dissorietation afterwards (last daytime seizure June 2013)  . Urge urinary incontinence   . Vesicoureteral reflux, bilateral   . Wears glasses    Past Surgical History:  Procedure Laterality Date  . COLONOSCOPY    . COLONOSCOPY W/ POLYPECTOMY  09-11-2009  . GREEN LIGHT LASER TURP (TRANSURETHRAL RESECTION OF PROSTATE N/A 04/07/2014   Procedure: GREEN LIGHT LASER TURP  (TRANSURETHRAL RESECTION OF PROSTATE;  Surgeon: Ailene Rud, MD;  Location: [facility];  Service: Urology;  Laterality: N/A;  . INGUINAL HERNIA REPAIR Bilateral 04/15/2013   Procedure: LAPAROSCOPIC BILATERAL INGUINAL HERNIA REPAIR;  Surgeon: Gayland Curry, MD;  [facility];  Service: General;  Laterality: Bilateral;  . INSERTION OF MESH N/A 04/15/2013   Procedure: INSERTION OF MESH;  Surgeon: Gayland Curry, MD;  [facility];  Service: General;  Laterality: N/A;  . POLYPECTOMY    . UMBILICAL HERNIA REPAIR N/A 04/15/2013   Procedure: OPEN HERNIA REPAIR UMBILICAL ADULT;  Surgeon: Gayland Curry, MD;  [facility];  Service: General;  Laterality: N/A;   Family History  Problem Relation Age of Onset  . Seizures Father   . Heart disease Father   . Cancer Father        prostat  . Cancer Mother        stomach  . Stomach cancer Mother   . Colon cancer Neg Hx   . Colon polyps Neg Hx   . Esophageal cancer Neg Hx   . Rectal cancer Neg Hx    Social History  Substance Use Topics  . Smoking status: Former Smoker    Packs/day: 1.00    Years: 15.00    Types: Cigarettes    Quit date: 04/03/1972  . Smokeless  tobacco: Never Used  . Alcohol use 8.4 oz/week    14 Glasses of wine per week     Comment: 2 wine daily    Review of Systems  Constitutional: Negative.   HENT: Negative.   Eyes: Negative.   Respiratory: Negative.   Cardiovascular: Negative.   Gastrointestinal: Negative.   Endocrine: Negative.   Genitourinary: Negative.   Musculoskeletal: Positive for arthralgias.  Allergic/Immunologic: Negative.   Neurological: Negative.   Hematological: Negative.   Psychiatric/Behavioral: Negative.     Allergies  Patient has no known allergies.  Home Medications   Prior to Admission medications   Medication Sig Start Date End Date Taking? Authorizing Provider  cloBAZam (ONFI) 10 MG tablet Take 30 mg by mouth daily.    [provider]   GABAPENTIN PO Take 1,000 mg by mouth 2 (two) times daily before a meal.    [provider]  Multiple Vitamin (MULTIVITAMIN) tablet Take 1 tablet by mouth daily.    [provider]  tadalafil (CIALIS) 5 MG tablet Take 20 mg by mouth daily as needed. Reported on 09/17/2015    [provider]   Meds Ordered and Administered this Visit  Medications - No data to display  BP 106/60 (BP Location: Left Arm)   Pulse 71   Temp 98 F (36.7 C) (Oral)   Resp 16   SpO2 96%  No data found.   Physical Exam  Constitutional: He appears well-developed and well-nourished.  HENT:  Head: Normocephalic and atraumatic.  Eyes: Conjunctivae and EOM are normal. Pupils are equal, round, and reactive to light.  Neck: Normal range of motion. Neck supple.  Cardiovascular: Normal rate, regular rhythm and normal heart sounds.   Pulmonary/Chest: Effort normal and breath sounds normal.  Musculoskeletal: He exhibits tenderness.  Right calf to right posterior thigh with Erythematous and swollen varicose veing and leg.  Nursing note and vitals reviewed.   Urgent Care Course     Procedures (including critical care time)  Labs Review Labs Reviewed - No data to display  Imaging Review No results found.   Visual Acuity Review  Right Eye Distance:   Left Eye Distance:   Bilateral Distance:    Right Eye Near:   Left Eye Near:    Bilateral Near:         MDM   1. Right leg swelling   2. Thrombophlebitis   3. Right leg pain    Recommend transfer to ED for higher level of care.      Lysbeth Penner, [place] 10/18/16 [phone]

## 2016-10-18 NOTE — ED Provider Notes (Signed)
[facility] DEPT Provider Note   CSN: [id number] Arrival date & time: 10/18/16  1638     History   Chief Complaint Chief Complaint  Patient presents with  . Leg Swelling    HPI DELMER KOWALSKI is a 71 y.o. male.  HPI   71 yo M with h/o varicose veins here with right leg pain and swelling. Pt states he recently began working out again and went to jog and use a rowing machine 3 days ago. He felt fine during the exercise. Approx 2 days later, he developed an itchy, mild ache in his right medial thigh that has now turned into a tender, painful area of redness and swelling. He has had NO associated fevers, nausea, vomiting, or chills. No drainage or wounds. Denies any h/o DVT. No CP, SOB, or palpitations. No cough. Pain made worse with palpation and denies any alleviating factors.  Past Medical History:  Diagnosis Date  . Anxiety disorder   . Bipolar 1 disorder, mixed ([place])    w/ hx psychosis/ dissociative reactions/  suicide ideations and attempt  . BPH (benign prostatic hypertrophy) with urinary obstruction   . Frequency of urination   . History of adenomatous polyp of colon   . History of closed head injury    2000-- fell off ladder--  temporary memory loss resolved  . History of panic attacks   . History of psychosis    x2  last one documented 2006  w/ hallucination  and suicide ideation  . Nocturia   . Seizures ([place])   . Sigmoid diverticulosis   . Temporal lobe epilepsy syndrome [facility]) neurologist-  dr Leta Baptist--  Linden Dolin one every 2 weeks "legs jerking around, per wife , pt unaware he's doing this"   localization-related right temporal lobe --  mostly nocturnal w/ bilateral lower extremitity movement, staring and moaning then dissorietation afterwards (last daytime seizure June 2013)  . Urge urinary incontinence   . Vesicoureteral reflux, bilateral   . Wears glasses     Patient Active Problem List   Diagnosis Date Noted  . Localization-related epilepsy ([place])  08/11/2012    Past Surgical History:  Procedure Laterality Date  . COLONOSCOPY    . COLONOSCOPY W/ POLYPECTOMY  09-11-2009  . GREEN LIGHT LASER TURP (TRANSURETHRAL RESECTION OF PROSTATE N/A 04/07/2014   Procedure: GREEN LIGHT LASER TURP (TRANSURETHRAL RESECTION OF PROSTATE;  Surgeon: Ailene Rud, MD;  Location[facility];  Service: Urology;  Laterality: N/A;  . INGUINAL HERNIA REPAIR Bilateral 04/15/2013   Procedure: LAPAROSCOPIC BILATERAL INGUINAL HERNIA REPAIR;  Surgeon: Gayland Curry, MD;  Location: [facility];  Service: General;  Laterality: Bilateral;  . INSERTION OF MESH N/A 04/15/2013   Procedure: INSERTION OF MESH;  Surgeon: Gayland Curry, MD;  Location: [facility];  Service: General;  Laterality: N/A;  . POLYPECTOMY    . UMBILICAL HERNIA REPAIR N/A 04/15/2013   Procedure: OPEN HERNIA REPAIR UMBILICAL ADULT;  Surgeon: Gayland Curry, MD;  Location: [facility];  Service: General;  Laterality: N/A;       Home Medications    Prior to Admission medications   Medication Sig Start Date End Date Taking? Authorizing Provider  aspirin 81 MG EC tablet Take 1 tablet (81 mg total) by mouth daily. Swallow whole. 10/18/16 11/17/16  Duffy Bruce, MD  cephALEXin (KEFLEX) 500 MG capsule Take 1 capsule (500 mg total) by mouth 3 (three) times daily. 10/18/16 10/25/16  Duffy Bruce, MD  cloBAZam (ONFI) 10 MG tablet Take  30 mg by mouth daily.    [provider]  GABAPENTIN PO Take 1,000 mg by mouth 2 (two) times daily before a meal.    [provider]  Multiple Vitamin (MULTIVITAMIN) tablet Take 1 tablet by mouth daily.    [provider]  sulfamethoxazole-trimethoprim (BACTRIM DS,SEPTRA DS) 800-160 MG tablet Take 1 tablet by mouth 2 (two) times daily. 10/18/16 10/25/16  Duffy Bruce, MD  tadalafil (CIALIS) 5 MG tablet Take 20 mg by mouth daily as needed. Reported on 09/17/2015    [provider]    Family History Family History  Problem  Relation Age of Onset  . Seizures Father   . Heart disease Father   . Cancer Father        prostat  . Cancer Mother        stomach  . Stomach cancer Mother   . Colon cancer Neg Hx   . Colon polyps Neg Hx   . Esophageal cancer Neg Hx   . Rectal cancer Neg Hx     Social History Social History  Substance Use Topics  . Smoking status: Former Smoker    Packs/day: 1.00    Years: 15.00    Types: Cigarettes    Quit date: 04/03/1972  . Smokeless tobacco: Never Used  . Alcohol use 8.4 oz/week    14 Glasses of wine per week     Comment: 2 wine daily     Allergies   Patient has no known allergies.   Review of Systems Review of Systems  Constitutional: Negative for fever.  Cardiovascular: Positive for leg swelling.  Musculoskeletal: Positive for arthralgias.  Skin: Positive for rash.  All other systems reviewed and are negative.    Physical Exam Updated Vital Signs BP 109/68   Pulse (!) 55   Temp 98.1 F (36.7 C) (Oral)   Resp 18   SpO2 95%   Physical Exam  Constitutional: He is oriented to person, place, and time. He appears well-developed and well-nourished. No distress.  HENT:  Head: Normocephalic and atraumatic.  Eyes: Conjunctivae are normal.  Neck: Neck supple.  Cardiovascular: Normal rate, regular rhythm and normal heart sounds.  Exam reveals no friction rub.   No murmur heard. Pulmonary/Chest: Effort normal and breath sounds normal. No respiratory distress. He has no wheezes. He has no rales.  Abdominal: He exhibits no distension.  Musculoskeletal: He exhibits no edema.  Neurological: He is alert and oriented to person, place, and time. He exhibits normal muscle tone.  Skin: Skin is warm. Capillary refill takes less than 2 seconds.  Psychiatric: He has a normal mood and affect.  Nursing note and vitals reviewed.   LOWER EXTREMITY EXAM: RIGHT  INSPECTION & PALPATION: Significant, palpable varicosities throughout leg, worse along medial thigh with  associated firm, tender varicosity with overlying skin redness and pain. No purulence, wounds, or drainage. No TTP beyond the areas of erythema.  SENSORY: sensation is intact to light touch in:  Superficial peroneal nerve distribution (over dorsum of foot) Deep peroneal nerve distribution (over first dorsal web space) Sural nerve distribution (over lateral aspect 5th metatarsal) Saphenous nerve distribution (over medial instep)  MOTOR:  + Motor EHL (great toe dorsiflexion) + FHL (great toe plantar flexion)  + TA (ankle dorsiflexion)  + GSC (ankle plantar flexion)  VASCULAR: 2+ dorsalis pedis and posterior tibialis pulses Capillary refill < 2 sec, toes warm and well-perfused  COMPARTMENTS: Soft, warm, well-perfused No pain with passive extension No parethesias   ED  Treatments / Results  Labs (all labs ordered are listed, but only abnormal results are displayed) Labs Reviewed  CBC WITH DIFFERENTIAL/PLATELET  BASIC METABOLIC PANEL    EKG  EKG Interpretation None       Radiology No results found.  Procedures Procedures (including critical care time)  Medications Ordered in ED Medications  aspirin chewable tablet 324 mg (324 mg Oral Given 10/18/16 2113)  sulfamethoxazole-trimethoprim (BACTRIM DS,SEPTRA DS) 800-160 MG per tablet 1 tablet (1 tablet Oral Given 10/18/16 2113)  cephALEXin (KEFLEX) capsule 500 mg (500 mg Oral Given 10/18/16 2113)     Initial Impression / Assessment and Plan / ED Course  I have reviewed the triage vital signs and the nursing notes.  Pertinent labs & imaging results that were available during my care of the patient were reviewed by me and considered in my medical decision making (see chart for details).    71 yo M here with superficial thrombophlebitis of left leg, likely 2/2 minor repetitive trauma after working out. Pt is o/w afebrile, remarkably well appearing, with normal WBC and no signs of sepsis or septic phlebitis. He is tolerating  PO. DVT study obtained shows no extension to the deep veins and no associated DVT. No abscess formation. Discussed with Dr. Donzetta [facility] - will place on ABX, warm compresses, start on ASA and refer for outpt follow-up.  Final Clinical Impressions(s) / ED Diagnoses   Final diagnoses:  Thrombophlebitis of superficial veins of right lower extremity    New Prescriptions Discharge Medication List as of 10/18/2016  8:58 PM    START taking these medications   Details  aspirin 81 MG EC tablet Take 1 tablet (81 mg total) by mouth daily. Swallow whole., Starting Sat 10/18/2016, Until Mon 11/17/2016, Print    cephALEXin (KEFLEX) 500 MG capsule Take 1 capsule (500 mg total) by mouth 3 (three) times daily., Starting Sat 10/18/2016, Until Sat 10/25/2016, Print    sulfamethoxazole-trimethoprim (BACTRIM DS,SEPTRA DS) 800-160 MG tablet Take 1 tablet by mouth 2 (two) times daily., Starting Sat 10/18/2016, Until Sat 10/25/2016, Print         Duffy Bruce, MD 10/18/16 2337

## 2016-10-18 NOTE — Discharge Instructions (Signed)
Go straight to Emergency Room

## 2016-10-18 NOTE — Progress Notes (Signed)
VASCULAR LAB PRELIMINARY  PRELIMINARY  PRELIMINARY  PRELIMINARY  Right lower extremity venous duplex completed.    Preliminary report:  There is no DVT noted in the right lower extremity.  There is superficial thrombophlebitis noted throughout varicosities from calf to mid thigh.  Gave report to Dr. Lawanda Cousins, [facility], RVT 10/18/2016, 8:27 PM

## 2016-12-19 ENCOUNTER — Ambulatory Visit
Admission: RE | Admit: 2016-12-19 | Discharge: 2016-12-19 | Disposition: A | Discharge status: Home / Self Care | Payer: Medicare Other | Source: Ambulatory Visit | Attending: Urology | Admitting: Urology

## 2016-12-19 ENCOUNTER — Other Ambulatory Visit: Payer: Self-pay | Admitting: Urology

## 2016-12-19 DIAGNOSIS — R310 Gross hematuria: Secondary | ICD-10-CM

## 2016-12-24 ENCOUNTER — Encounter: Payer: Self-pay | Admitting: Vascular Surgery

## 2016-12-24 ENCOUNTER — Other Ambulatory Visit: Payer: Self-pay

## 2016-12-24 DIAGNOSIS — I8391 Asymptomatic varicose veins of right lower extremity: Secondary | ICD-10-CM

## 2016-12-24 DIAGNOSIS — I809 Phlebitis and thrombophlebitis of unspecified site: Secondary | ICD-10-CM

## 2016-12-26 ENCOUNTER — Ambulatory Visit (INDEPENDENT_AMBULATORY_CARE_PROVIDER_SITE_OTHER): Payer: Medicare Other | Admitting: Vascular Surgery

## 2016-12-26 ENCOUNTER — Encounter: Payer: Self-pay | Admitting: Vascular Surgery

## 2016-12-26 ENCOUNTER — Ambulatory Visit (HOSPITAL_COMMUNITY)
Admission: RE | Admit: 2016-12-26 | Discharge: 2016-12-26 | Disposition: A | Discharge status: Home / Self Care | Payer: Medicare Other | Source: Ambulatory Visit | Attending: Vascular Surgery | Admitting: Vascular Surgery

## 2016-12-26 VITALS — BP 107/71 | HR 67 | Temp 97.0°F | Resp 16 | Ht 68.0 in | Wt 140.0 lb

## 2016-12-26 DIAGNOSIS — M7989 Other specified soft tissue disorders: Secondary | ICD-10-CM | POA: Insufficient documentation

## 2016-12-26 DIAGNOSIS — I809 Phlebitis and thrombophlebitis of unspecified site: Secondary | ICD-10-CM | POA: Diagnosis not present

## 2016-12-26 DIAGNOSIS — I8391 Asymptomatic varicose veins of right lower extremity: Secondary | ICD-10-CM | POA: Diagnosis not present

## 2016-12-26 DIAGNOSIS — I82811 Embolism and thrombosis of superficial veins of right lower extremities: Secondary | ICD-10-CM | POA: Insufficient documentation

## 2016-12-26 DIAGNOSIS — I872 Venous insufficiency (chronic) (peripheral): Secondary | ICD-10-CM

## 2016-12-26 NOTE — Progress Notes (Signed)
Patient ID: John Holt, male   DOB: 1945/05/24, 71 y.o.   MRN: [id number]  Reason for Consult: New Patient (Initial Visit) (varicose veins)   Referred by [name], MD  Subjective:     HPI:  John Holt is a 71 y.o. male without significant vascular disease having never had coronary disease or strokes or lower extremity issues. He does not have issues with walking. More recently he does have problems with hematuria and is being worked up for that aspect and is also been placed on antibiotics for UTI. He did have an episode of superficial thrombophlebitis in his right thigh this past summer which he says was very painful but did resolve with warm compresses and antibiotics. He is now here for evaluation of his bilateral lower extremity varicose veins. He also has significant spider veins throughout. He has not had any further episodes of superficial thrombophlebitis. He denies ever having history of deep venous thrombosis. 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 He infrequently wears compression stockings usually on that when he flies an airplane to 3 times per year. He has no new issues related to today's visit.  Past Medical History:  Diagnosis Date  . Anxiety disorder   . Bipolar 1 disorder, mixed ([place])    w/ hx psychosis/ dissociative reactions/  suicide ideations and attempt  . BPH (benign prostatic hypertrophy) with urinary obstruction   . Frequency of urination   . History of adenomatous polyp of colon   . History of closed head injury    2000-- fell off ladder--  temporary memory loss resolved  . History of panic attacks   . History of psychosis    x2  last one documented 2006  w/ hallucination  and suicide ideation  . Nocturia   . Seizures ([place])   . Sigmoid diverticulosis   . Temporal lobe epilepsy syndrome [facility]) neurologist-  dr Leta Baptist--  Linden Dolin one every 2 weeks "legs jerking around, per wife , pt unaware he's doing this"   localization-related right temporal lobe --  mostly nocturnal w/ bilateral lower extremitity movement, staring and moaning then dissorietation afterwards (last daytime seizure June 2013)  . Urge urinary incontinence   . Vesicoureteral reflux, bilateral   . Wears glasses    Family History  Problem Relation Age of Onset  . Seizures Father   . Heart disease Father   . Cancer Father        prostat  . Cancer Mother        stomach  . Stomach cancer Mother   . Colon cancer Neg Hx   . Colon polyps Neg Hx   . Esophageal cancer Neg Hx   . Rectal cancer Neg Hx    Past Surgical History:  Procedure Laterality Date  . COLONOSCOPY    . COLONOSCOPY W/ POLYPECTOMY  09-11-2009  . GREEN LIGHT LASER TURP (TRANSURETHRAL RESECTION OF PROSTATE N/A 04/07/2014   Procedure: GREEN LIGHT LASER TURP (TRANSURETHRAL RESECTION OF PROSTATE;  Surgeon: Ailene Rud, MD;  Location: [facility];  Service: Urology;  Laterality: N/A;  . INGUINAL HERNIA REPAIR Bilateral 04/15/2013   Procedure: LAPAROSCOPIC BILATERAL INGUINAL HERNIA REPAIR;  Surgeon: Gayland Curry, MD;  Location: [facility];  Service: General;  Laterality: Bilateral;  . INSERTION OF MESH N/A 04/15/2013   Procedure: INSERTION OF MESH;  Surgeon: Gayland Curry, MD;  Location: [facility];  Service: General;  Laterality: N/A;  . POLYPECTOMY    .  UMBILICAL HERNIA REPAIR N/A 04/15/2013   Procedure: OPEN HERNIA REPAIR UMBILICAL ADULT;  Surgeon: Gayland Curry, MD;  Location: [facility];  Service: General;  Laterality: N/A;    Short Social History:  Social History  Substance Use Topics  . Smoking status: Former Smoker    Packs/day: 1.00    Years: 15.00    Types: Cigarettes    Quit date: 04/03/1972  . Smokeless tobacco: Never Used  . Alcohol use 8.4 oz/week    14 Glasses of wine per week     Comment: 2 wine daily    No Known Allergies  Current Outpatient Prescriptions  Medication Sig Dispense Refill  . cloBAZam (ONFI) 10 MG tablet Take 30  mg by mouth daily.    . Multiple Vitamin (MULTIVITAMIN) tablet Take 1 tablet by mouth daily.    . tadalafil (CIALIS) 5 MG tablet Take 20 mg by mouth daily as needed. Reported on 09/17/2015    . GABAPENTIN PO Take 1,000 mg by mouth 2 (two) times daily before a meal.     No current facility-administered medications for this visit.     Review of Systems  Constitutional:  Constitutional negative. HENT: HENT negative.  Eyes: Eyes negative.  Cardiovascular: Cardiovascular negative.  GI: Gastrointestinal negative.  GU: Positive for hematuria.  Musculoskeletal: Positive for leg pain.  Neurological: Positive for dizziness and seizures.  Hematologic: Hematologic/lymphatic negative.  Psychiatric: Psychiatric negative.        Objective:  Objective   Vitals:   12/26/16 1047  BP: 107/71  Pulse: 67  Resp: 16  Temp: (!) 97 F (36.1 C)  SpO2: 100%  Weight: 140 lb (63.5 kg)  Height: 5\' 8"  (1.727 m)   Body mass index is 21.29 kg/m.  Physical Exam  Constitutional: He is oriented to person, place, and time. He appears well-developed.  HENT:  Head: Normocephalic.  Eyes: Pupils are equal, round, and reactive to light.  Neck: Normal range of motion.  Cardiovascular:  Pulses:      Carotid pulses are 2+ on the right side, and 2+ on the left side.      Radial pulses are 2+ on the right side, and 2+ on the left side.       Dorsalis pedis pulses are 2+ on the right side, and 2+ on the left side.       Posterior tibial pulses are 2+ on the right side, and 2+ on the left side.  Pulmonary/Chest: Effort normal.  Abdominal: Soft. He exhibits no mass.  Musculoskeletal: He exhibits edema.  Painless cord right medial thigh Significant varicosities ble, clustered right thigh, left medial thigh and posterior leg  Neurological: He is alert and oriented to person, place, and time.  Skin: Skin is warm and dry.  Psychiatric: He has a normal mood and affect. His behavior is normal. Judgment and thought  content normal.    Data: I reviewed his right lower extremity venous duplex exam which demonstrates significant reflux in the right greater saphenous vein saphenofemoral junction 0.6 cm.     Assessment/Plan:     71 year old male with C3 venous disease as well as significant varicosities and spider veins throughout his bilateral lower extremity. He recently had an episode of superficial thrombophlebitis and I think I can palpate this core on the right medial thigh although it is no longer painful. He has multiple areas of clusters of varicosities in his left lower extremity. He is not worried about the cosmesis. We discussed compression stockings being thigh-high  and he was given information regarding elastic therapy which she will look into today. We discussed that these are mostly safe further episodes of pain can be treated with NSAIDs and an warm compresses. Any bleeding although unlikely from his varicosities to be handled with direct pressure. He is not interested in therapy for a cosmetic reason. If he would change his mind to proceed with therapy he would need left lower 70 venous duplex evaluation and follow-up with either Dr. Donnetta Hutching or Dr. Kellie Simmering. Thigh-high compression stockings beginning today.     Waynetta Sandy MD [facility]

## 2017-02-24 DIAGNOSIS — C674 Malignant neoplasm of posterior wall of bladder: Secondary | ICD-10-CM | POA: Insufficient documentation

## 2017-02-25 ENCOUNTER — Encounter: Payer: Self-pay | Admitting: Neurology

## 2017-05-13 ENCOUNTER — Ambulatory Visit: Payer: Medicare Other | Admitting: Neurology

## 2017-05-13 ENCOUNTER — Encounter: Payer: Self-pay | Admitting: Neurology

## 2017-05-13 VITALS — BP 98/62 | HR 82 | Ht 68.0 in | Wt 141.0 lb

## 2017-05-13 DIAGNOSIS — G40219 Localization-related (focal) (partial) symptomatic epilepsy and epileptic syndromes with complex partial seizures, intractable, without status epilepticus: Secondary | ICD-10-CM | POA: Diagnosis not present

## 2017-05-13 NOTE — Progress Notes (Signed)
NEUROLOGY CONSULTATION NOTE  John Holt MRN: [id number] DOB: 26-Aug-1945  Referring provider: Dr. Neena Rhymes Primary care provider: Dr. [name]  Reason for consult:  Establish care for epilepsy  Dear Dr Jacelyn Grip:  Thank you for your kind referral of John Holt for consultation of the above symptoms. Although his history is well known to you, please allow me to reiterate it for the purpose of our medical record. The patient was accompanied to the clinic by his wife who also provides collateral information. Records and images were personally reviewed where available.  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 He had been seeing epileptologist Dr. Jacelyn Grip for several years, records were reviewed. He reports staring episodes in childhood. In his 72s, he recalls having episodes of a sinking feeling in his stomach with deja vu occurring around once a month initially. When he was started on seizure medications, these episodes occur rarely, around 1-2 a year. His wife started noticing possible symptoms in his 74s, they were in a meeting and she noticed him swaying back and forth but seemed unaware of it. He started having nocturnal spells more than 15 years ago, where he would have "leg thrusting" (bicycling type leg movements), lip smacking, often followed by arousal with disorientation, need to urinate and walking around (at times resembling sleepwalking). He has had episodes where he would have a cluster of seizures then become paranoid and manic with significant post-ictal psychosis. He usually has nocturnal seizures every 3 weeks or so. His wife would give him prn clonazepam when he has clusters, and this has helped "zap" them. He has not needed clonazepam in at least 1.5 years. He has rare daytime seizures, his wife recalls the  seizures in 2006 and 2013 with post-ictal psychosis, there is note of a spell of undressing and confusion in the [employer] at [place] in 2015. In the past he has been on Lamictal, Depakote, Gabapentin. Gabapentin has been tapered off. He has been taking Onfi for several years and feels this has been the most helpful. He was recently diagnosed with bladder cancer in October 2018 and underwent resection and chemotherapy. They report that towards the end of chemo, he had some incidents with seizures. He has no recollection of the events. His wife reports that after he had his second infusion and the PICC line was taken out, he started having slight feet movements and was staring and unresponsive. He had another episode while shoveling snow, he came in and started talking and his speech became garbled, "words were not right" for 60 seconds. Three days after on the way home from chemo, he had a similar episode with his speech. He was talking about Aristotle then his speech became garbled. The last daytime event was on 04/28/17. He started swaying side to side, speech was garbled, and he was staring off for a few minutes. Due to these daytime episodes, his Onfi dose was temporarily increased to 40mg  qhs. They report he has not had any nocturnal seizures since November, which is unusual for him. The higher dose of Onfi knocks him out. They report he is done with chemotherapy and will be seeing his oncologist to discuss next steps.  He denies any headaches, dizziness, diplopia, dysarthria/dysphagia, neck/back pain, focal numbness/tingling/weakness, bowel/bladder dysfunction, no falls. He had muscle cramps, taking magnesium supplements has helped.  His memory is what bothers him a lot. They mostly notice problems with his "narrative memory." He would not recall watching a movie 2-3 weeks prior. He cannot recall anecdotes. He would make notes on an article, and later find that he had already made notes on the same article  previously. He is able to remember abstract concepts from dense philosophy books he reads. His spatial memory is not good. He denies missing medications. Every once in a while he forgets his medications (twice in the past month). He drives only to the grocery and denies getting lost. He has had Neuropsychological testing, report unavailable for review.  Prior AEDs: Depakote, Lamictal, Tegretol, Trileptal, Keppra, Vimpat, Gabapentin  Epilepsy Risk Factors: His father had rare spells 1-2 times a year of "zonking out." He and his brother had "spasms" as babies, none after age 36/3. He recalls having a head injury in his teenage years. Otherwise he had a normal birth and early development, no history of febrile convulsions, CNS infections, or neurosurgical procedures.   Prior workup: MRI brain 01/2014: subtle findings consistent with right mesial temporal sclerosis EEGs: EEG 2009 - right temporal spike activity and right TIRDA EEG 01/2009 - normal EEG 07/2012 - normal  MRI 2006 - normal  PET Brain 2016 - right mesial temporal hypometabolism.  EMU 04/25/2014 - 05/04/2014 6 events captured on camera, 1 off camera - repeated neck extension and flexion, followed by pelvic thrusting and body rocking movements. Appear to localize to the right temporal region, although clinically suggestive of frontal onset. Patient taken off Depakote and Lamictal and switched to high dose gabapentin   PAST MEDICAL HISTORY: Past Medical History:  Diagnosis Date  . Anxiety disorder   . Bipolar 1 disorder, mixed ([place])    w/ hx psychosis/ dissociative reactions/  suicide ideations and attempt  . BPH (benign prostatic hypertrophy) with urinary obstruction   . Frequency of urination   . History of adenomatous polyp of colon   . History of closed head injury    2000-- fell off ladder--  temporary memory loss resolved  . History of panic attacks   . History of psychosis    x2  last one documented 2006  w/ hallucination   and suicide ideation  . Nocturia   . Seizures ([place])   . Sigmoid diverticulosis   . Temporal lobe epilepsy syndrome [facility]) neurologist-  dr Leta Baptist--  Linden Dolin one every 2 weeks "legs jerking around, per wife , pt unaware he's doing this"   localization-related right temporal lobe --  mostly nocturnal w/ bilateral lower extremitity movement, staring and moaning then dissorietation afterwards (last daytime seizure June 2013)  . Urge urinary incontinence   . Vesicoureteral reflux, bilateral   . Wears glasses     PAST SURGICAL HISTORY: Past Surgical History:  Procedure Laterality Date  . COLONOSCOPY    . COLONOSCOPY W/ POLYPECTOMY  09-11-2009  . GREEN LIGHT LASER TURP (TRANSURETHRAL RESECTION OF PROSTATE N/A 04/07/2014   Procedure: GREEN LIGHT LASER TURP (TRANSURETHRAL RESECTION OF PROSTATE;  Surgeon: Ailene Rud, MD;  [facility];  Service: Urology;  Laterality: N/A;  . INGUINAL HERNIA REPAIR Bilateral 04/15/2013   Procedure: LAPAROSCOPIC BILATERAL INGUINAL HERNIA REPAIR;  Surgeon: Gayland Curry, MD;  Location: [facility];  Service: General;  Laterality: Bilateral;  . INSERTION OF MESH N/A 04/15/2013   Procedure: INSERTION OF MESH;  Surgeon: Gayland Curry, MD;  Location: [facility];  Service: General;  Laterality: N/A;  . POLYPECTOMY    .  UMBILICAL HERNIA REPAIR N/A 04/15/2013   Procedure: OPEN HERNIA REPAIR UMBILICAL ADULT;  Surgeon: Gayland Curry, MD;  Location: [facility];  Service: General;  Laterality: N/A;    MEDICATIONS: Current Outpatient Medications on File Prior to Visit  Medication Sig Dispense Refill  . cloBAZam (ONFI) 10 MG tablet Take 30 mg by mouth daily.    . clonazePAM (KLONOPIN) 0.5 MG tablet Take 1 tablet PO PRN 2 seizures in one day or signs of postictal psychosis.  May repeat once in 24 hours.    . Magnesium 500 MG TABS Take 30 mg by mouth 2 (two) times daily.    . Multiple Vitamin (MULTIVITAMIN) tablet Take 1 tablet by mouth daily.    .  tadalafil (CIALIS) 5 MG tablet Take 20 mg by mouth daily as needed. Reported on 09/17/2015     No current facility-administered medications on file prior to visit.     ALLERGIES: No Known Allergies  FAMILY HISTORY: Family History  Problem Relation Age of Onset  . Seizures Father   . Heart disease Father   . Cancer Father        prostat  . Cancer Mother        stomach  . Stomach cancer Mother   . Colon cancer Neg Hx   . Colon polyps Neg Hx   . Esophageal cancer Neg Hx   . Rectal cancer Neg Hx     SOCIAL HISTORY: Social History   Socioeconomic History  . Marital status: Married    Spouse name: [name]  . Number of children: 2  . Years of education: PhD  . [facility] education level: Not on file  Social Needs  . Financial resource strain: Not on file  . Food insecurity - worry: Not on file  . Food insecurity - inability: Not on file  . Transportation needs - medical: Not on file  . 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 Smoking status: Former Smoker    Packs/day: 1.00    Years: 15.00    Pack years: 15.00    Types: Cigarettes    Last attempt to quit: 04/03/1972    Years since quitting: 45.1  . Smokeless tobacco: Never Used  Substance and Sexual Activity  . Alcohol use: Yes    Alcohol/week: 8.4 oz    Types: 14 Glasses of wine per week    Comment: 2 wine daily  . Drug use: No  . Sexual activity: Not on file  Other Topics Concern  . Not on file  Social History Narrative      Pt lives at home with his spouse.   Caffeine Use- 2 cups daily    REVIEW OF SYSTEMS: Constitutional: No fevers, chills, or sweats, no generalized fatigue, change in appetite Eyes: No visual changes, double vision, eye pain Ear, nose and throat: No hearing loss, ear pain, nasal congestion, sore throat Cardiovascular: No chest pain, palpitations Respiratory:  No shortness of breath at  rest or with exertion, wheezes GastrointestinaI: No nausea, vomiting, diarrhea, abdominal pain, fecal incontinence Genitourinary:  No dysuria, urinary retention or frequency Musculoskeletal:  No neck pain, back pain Integumentary: No rash, pruritus, skin lesions Neurological: as above Psychiatric: No depression, insomnia, anxiety Endocrine: No palpitations, fatigue, diaphoresis, mood swings, change in appetite, change in weight, increased thirst Hematologic/Lymphatic:  No anemia, purpura, petechiae. Allergic/Immunologic: no itchy/runny eyes, nasal congestion, recent allergic reactions,  rashes  PHYSICAL EXAM: Vitals:   05/13/17 1025  BP: 98/62  Pulse: 82  SpO2: 99%   General: No acute distress Head:  Normocephalic/atraumatic Eyes: Fundoscopic exam shows bilateral sharp discs, no vessel changes, exudates, or hemorrhages Neck: supple, no paraspinal tenderness, full range of motion Back: No paraspinal tenderness Heart: regular rate and rhythm Lungs: Clear to auscultation bilaterally. Vascular: No carotid bruits. Skin/Extremities: No rash, no edema Neurological Exam: Mental status: alert and oriented to person, place, and time, no dysarthria or aphasia, Fund of knowledge is appropriate.  Recent and remote memory are intact.  Attention and concentration are normal.    Able to name objects and repeat phrases.  Cranial nerves: CN I: not tested CN II: pupils equal, round and reactive to light, visual fields intact, fundi unremarkable. CN III, IV, VI:  full range of motion, no nystagmus, no ptosis CN V: facial sensation intact CN VII: upper and lower face symmetric CN VIII: hearing intact to finger rub CN IX, X: gag intact, uvula midline CN XI: sternocleidomastoid and trapezius muscles intact CN XII: tongue midline Bulk & Tone: normal, no fasciculations. Motor: 5/5 throughout with no pronator drift. Sensation: intact to light touch, cold, pin, vibration and joint position sense.  No  extinction to double simultaneous stimulation.  Romberg test negative Deep Tendon Reflexes: +1 throughout, no ankle clonus Plantar responses: downgoing bilaterally Cerebellar: no incoordination on finger to nose testing Gait: narrow-based and steady, able to tandem walk adequately. 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 MRI brain showed subtle findings consistent with right mesial temporal sclerosis. PET scan showed right mesial temporal hypometabolism. EEGs in the past showed right temporal spikes and TIRDA, EMU admission captured seizures that appeared to localize to the right temporal area although clinically suggestive of frontal onset. He has been on Onfi monotherapy and feels fairly satisfied with seizures, majority have been nocturnal, he had a few daytime seizures recently while on chemotherapy. He would like to go back to [facility] 30mg  qhs, and will slowly taper down to this. We may add on a different AED if seizures increase in frequency. His main concern has been his memory, he had Neuropsych testing at [facility] in the past, repeat Neuropsych will be scheduled for him for interval follow-up. He is aware of [place] driving laws to stop driving after a seizure until 6 months seizure-free. He will follow-up in 3 months and knows to call for any changes.   Thank you for allowing me to participate in the care of this patient. Please do not hesitate to call for any questions or concerns.   Ellouise Newer, M.D.  CC: Dr. Dagmar Hait

## 2017-05-13 NOTE — Patient Instructions (Signed)
1. Alternate taking Onfi every other day 40mg  then 30mg  for a week, then reduce to 30mg  at bedtime 2. Schedule Neurocognitive testing 3. Follow-up in 3 months, call for any changes  Seizure Precautions: 1. If medication has been prescribed for you to prevent seizures, take it exactly as directed.  Do not stop taking the medicine without talking to your doctor first, even if you have not had a seizure in a long time.   2. Avoid activities in which a seizure would cause danger to yourself or to others.  Don't operate dangerous machinery, swim alone, or climb in high or dangerous places, such as on ladders, roofs, or girders.  Do not drive unless your doctor says you may.  3. If you have any warning that you may have a seizure, lay down in a safe place where you can't hurt yourself.    4.  No driving for 6 months from last seizure, as per [facility].   Please refer to the following link on the [facility] website for more information: http://[email or web address]/[email or web address]/driving/drivingu.cfm   5.  Maintain good sleep hygiene. Avoid alcohol.  6.  Contact your doctor if you have any problems that may be related to the medicine you are taking.  7.  Call 911 and bring the patient back to the ED if:        A.  The seizure lasts longer than 5 minutes.       B.  The patient doesn't awaken shortly after the seizure  C.  The patient has new problems such as difficulty seeing, speaking or moving  D.  The patient was injured during the seizure  E.  The patient has a temperature over 102 F (39C)  F.  The patient vomited and now is having trouble breathing

## 2017-05-20 ENCOUNTER — Encounter: Payer: Self-pay | Admitting: Neurology

## 2017-06-22 ENCOUNTER — Telehealth: Payer: Self-pay | Admitting: Neurology

## 2017-06-22 NOTE — Telephone Encounter (Signed)
Patient wants to talk to someone about medication and maybe a dosage change

## 2017-07-08 ENCOUNTER — Telehealth: Payer: Self-pay

## 2017-07-08 NOTE — Telephone Encounter (Signed)
Pt called to report accidental ONFI "overdose".  States that he took his medication last night and absentmindedly took it again this morning thinking he was taking his multi vitamin.  Advised pt to not drive, operate heavy machinery, and be mindful of any changes he may experience in regards to being tired, lightheaded,

## 2017-07-21 ENCOUNTER — Other Ambulatory Visit: Payer: Self-pay

## 2017-07-21 ENCOUNTER — Ambulatory Visit: Payer: Medicare Other | Admitting: Neurology

## 2017-07-21 ENCOUNTER — Telehealth: Payer: Self-pay

## 2017-07-21 ENCOUNTER — Encounter: Payer: Self-pay | Admitting: Neurology

## 2017-07-21 VITALS — BP 132/68 | HR 60 | Resp 18 | Ht 68.0 in | Wt 141.0 lb

## 2017-07-21 DIAGNOSIS — G40219 Localization-related (focal) (partial) symptomatic epilepsy and epileptic syndromes with complex partial seizures, intractable, without status epilepticus: Secondary | ICD-10-CM

## 2017-07-21 MED ORDER — CLOBAZAM 10 MG PO TABS: ORAL_TABLET | ORAL | 3 refills | Status: DC

## 2017-07-21 NOTE — Telephone Encounter (Signed)
Received notice from [facility] that pt's ONFI has been approved thru 04/27/18  [id number]

## 2017-07-21 NOTE — Patient Instructions (Signed)
1. Continue clobazam 10mg : take 3 tablets every night 2. Proceed with Neurocognitive testing in May. We will discuss medication changes after memory testing 3. Follow-up in 4 months, call for any changes  Seizure Precautions: 1. If medication has been prescribed for you to prevent seizures, take it exactly as directed.  Do not stop taking the medicine without talking to your doctor first, even if you have not had a seizure in a long time.   2. Avoid activities in which a seizure would cause danger to yourself or to others.  Don't operate dangerous machinery, swim alone, or climb in high or dangerous places, such as on ladders, roofs, or girders.  Do not drive unless your doctor says you may.  3. If you have any warning that you may have a seizure, lay down in a safe place where you can't hurt yourself.    4.  No driving for 6 months from last seizure, as per [facility].   Please refer to the following link on the [facility] website for more information: http://[email or web address].org/answerplace/Social/driving/drivingu.cfm   5.  Maintain good sleep hygiene. Avoid alcohol.  6.  Contact your doctor if you have any problems that may be related to the medicine you are taking.  7.  Call 911 and bring the patient back to the ED if:        A.  The seizure lasts longer than 5 minutes.       B.  The patient doesn't awaken shortly after the seizure  C.  The patient has new problems such as difficulty seeing, speaking or moving  D.  The patient was injured during the seizure  E.  The patient has a temperature over 102 F (39C)  F.  The patient vomited and now is having trouble breathing

## 2017-07-21 NOTE — Progress Notes (Signed)
NEUROLOGY FOLLOW UP OFFICE NOTE  John Holt [id number] 1945/11/20  HISTORY OF PRESENT ILLNESS: I had the pleasure of seeing John Holt in follow-up in the neurology clinic on 07/21/2017.  The patient was last seen 2 months ago for intractable epilepsy arising from the righ mesial temporal or mesial frontal region.  He is again accompanied by his wife who helps supplement the history today. Since his last visit, he has reduced the Onfi to 30mg  at bedtime. He had a nocturnal seizure 4 days ago, and another one 3 days before that. His usual pattern is having nocturnal seizure every 2 weeks. He usually has more seizures in the spring and fall. There have been no daytime seizures since he was doing chemotherapy in January. He is very concerned about memory issues and how much Onfi is contributing to it after reading about benzodiazepine side effects. He still wants to write one more book, but every couple of weeks notices that he reads and article he thinks he needs, and finds that he already made notes on it previously. He is scheduled for Neurocognitive testing in May 2019. He is happy to report that repeat cystoscopy and post-treatment scan were negative, he has declined further surgery and radiation. He denies any headaches, dizziness, diplopia, focal numbness/tingling/weakness. He slipped one time but states he would not count it as a fall, no injuries.   HPI 05/13/2017: This is a very pleasant 72 yo RH man with a history of focal epilepsy of right mesial temporal or right mesial frontal onset. He had been seeing epileptologist Dr. Jacelyn Grip for several years, records were reviewed. He reports staring episodes in childhood. In his 43s, he recalls having episodes of a sinking feeling in his stomach with deja vu occurring around once a month initially. When he was started on seizure medications, these episodes occur rarely, around 1-2 a year. His wife started noticing possible symptoms in his 74s, they were  in a meeting and she noticed him swaying back and forth but seemed unaware of it. He started having nocturnal spells more than 15 years ago, where he would have "leg thrusting" (bicycling type leg movements), lip smacking, often followed by arousal with disorientation, need to urinate and walking around (at times resembling sleepwalking). He has had episodes where he would have a cluster of seizures then become paranoid and manic with significant post-ictal psychosis. He usually has nocturnal seizures every 3 weeks or so. His wife would give him prn clonazepam when he has clusters, and this has helped "zap" them. He has not needed clonazepam in at least 1.5 years. He has rare daytime seizures, his wife recalls the seizures in 2006 and 2013 with post-ictal psychosis, there is note of a spell of undressing and confusion in the [employer] at [place] in 2015. In the past he has been on Lamictal, Depakote, Gabapentin. Gabapentin has been tapered off. He has been taking Onfi for several years and feels this has been the most helpful. He was recently diagnosed with bladder cancer in October 2018 and underwent resection and chemotherapy. They report that towards the end of chemo, he had some incidents with seizures. He has no recollection of the events. His wife reports that after he had his second infusion and the PICC line was taken out, he started having slight feet movements and was staring and unresponsive. He had another episode while shoveling snow, he came in and started talking and his speech became garbled, "words were not right" for 60  seconds. Three days after on the way home from chemo, he had a similar episode with his speech. He was talking about Aristotle then his speech became garbled. The last daytime event was on 04/28/17. He started swaying side to side, speech was garbled, and he was staring off for a few minutes. Due to these daytime episodes, his Onfi dose was temporarily increased to 40mg  qhs. They  report he has not had any nocturnal seizures since November, which is unusual for him. The higher dose of Onfi knocks him out. They report he is done with chemotherapy and will be seeing his oncologist to discuss next steps.  He denies any headaches, dizziness, diplopia, dysarthria/dysphagia, neck/back pain, focal numbness/tingling/weakness, bowel/bladder dysfunction, no falls. He had muscle cramps, taking magnesium supplements has helped. His memory is what bothers him a lot. They mostly notice problems with his "narrative memory." He would not recall watching a movie 2-3 weeks prior. He cannot recall anecdotes. He would make notes on an article, and later find that he had already made notes on the same article previously. He is able to remember abstract concepts from dense philosophy books he reads. His spatial memory is not good. He denies missing medications. Every once in a while he forgets his medications (twice in the past month). He drives only to the grocery and denies getting lost. He has had Neuropsychological testing, report unavailable for review.  Prior AEDs: Depakote, Lamictal, Tegretol, Trileptal, Keppra, Vimpat, Gabapentin  Epilepsy Risk Factors: His father had rare spells 1-2 times a year of "zonking out." He and his brother had "spasms" as babies, none after age 69/3. He recalls having a head injury in his teenage years. Otherwise he had a normal birth and early development, no history of febrile convulsions, CNS infections, or neurosurgical procedures.   Prior workup: MRI brain 01/2014: subtle findings consistent with right mesial temporal sclerosis EEGs: EEG 2009 - right temporal spike activity and right TIRDA EEG 01/2009 - normal EEG 07/2012 - normal  MRI 2006 - normal  PET Brain 2016 - right mesial temporal hypometabolism.  EMU 04/25/2014 - 05/04/2014 6 events captured on camera, 1 off camera - repeated neck extension and flexion, followed by pelvic thrusting and body  rocking movements. Appear to localize to the right temporal region, although clinically suggestive of frontal onset. Patient taken off Depakote and Lamictal and switched to high dose gabapentin  PAST MEDICAL HISTORY: Past Medical History:  Diagnosis Date  . Anxiety disorder   . Bipolar 1 disorder, mixed ([place])    w/ hx psychosis/ dissociative reactions/  suicide ideations and attempt  . BPH (benign prostatic hypertrophy) with urinary obstruction   . Frequency of urination   . History of adenomatous polyp of colon   . History of closed head injury    2000-- fell off ladder--  temporary memory loss resolved  . History of panic attacks   . History of psychosis    x2  last one documented 2006  w/ hallucination  and suicide ideation  . Nocturia   . Seizures ([place])   . Sigmoid diverticulosis   . Temporal lobe epilepsy syndrome [facility]) neurologist-  dr Leta Baptist--  Linden Dolin one every 2 weeks "legs jerking around, per wife , pt unaware he's doing this"   localization-related right temporal lobe --  mostly nocturnal w/ bilateral lower extremitity movement, staring and moaning then dissorietation afterwards (last daytime seizure June 2013)  . Urge urinary incontinence   . Vesicoureteral reflux, bilateral   . Wears  glasses     MEDICATIONS: Current Outpatient Medications on File Prior to Visit  Medication Sig Dispense Refill  . cloBAZam (ONFI) 20 MG tablet Take 40 mg by mouth daily.     . clonazePAM (KLONOPIN) 0.5 MG tablet Take 1 tablet PO PRN 2 seizures in one day or signs of postictal psychosis.  May repeat once in 24 hours.    . Magnesium 500 MG TABS Take 30 mg by mouth 2 (two) times daily.    . Multiple Vitamin (MULTIVITAMIN) tablet Take 1 tablet by mouth daily.    . tadalafil (CIALIS) 5 MG tablet Take 20 mg by mouth daily as needed. Reported on 09/17/2015     No current facility-administered medications on file prior to visit.     ALLERGIES: No Known Allergies  FAMILY HISTORY: Family  History  Problem Relation Age of Onset  . Seizures Father   . Heart disease Father   . Cancer Father        prostat  . Cancer Mother        stomach  . Stomach cancer Mother   . Colon cancer Neg Hx   . Colon polyps Neg Hx   . Esophageal cancer Neg Hx   . Rectal cancer Neg Hx     SOCIAL HISTORY: Social History   Socioeconomic History  . Marital status: Married    Spouse name: [name]  . Number of children: 2  . Years of education: PhD  . 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 Financial resource strain: Not on file  . Food insecurity:    Worry: Not on file    Inability: Not on file  . Transportation needs:    Medical: Not on file    Non-medical: Not on file  Tobacco Use  . Smoking status: Former Smoker    Packs/day: 1.00    Years: 15.00    Pack years: 15.00    Types: Cigarettes    Last attempt to quit: 04/03/1972    Years since quitting: 45.3  . Smokeless tobacco: Never Used  Substance and Sexual Activity  . Alcohol use: Yes    Alcohol/week: 8.4 oz    Types: 14 Glasses of wine per week    Comment: 2 wine daily  . Drug use: No  . Sexual activity: Not on file  Lifestyle  . Physical activity:    Days per week: Not on file    Minutes per session: Not on file  . Stress: Not on file  Relationships  . Social connections:    Talks on phone: Not on file    Gets together: Not on file    Attends religious service: Not on file    Active member of club or organization: Not on file    Attends meetings of clubs or organizations: Not on file    Relationship status: Not on file  . Intimate partner violence:    Fear of current or ex partner: Not on file    Emotionally abused: Not on file    Physically abused: Not on file    Forced sexual activity: Not on file  Other Topics Concern  . Not on file  Social History Narrative      Pt lives at home with his spouse.   Caffeine  Use- 2 cups daily    REVIEW OF SYSTEMS: Constitutional: No fevers, chills, or sweats, no generalized  fatigue, change in appetite Eyes: No visual changes, double vision, eye pain Ear, nose and throat: No hearing loss, ear pain, nasal congestion, sore throat Cardiovascular: No chest pain, palpitations Respiratory:  No shortness of breath at rest or with exertion, wheezes GastrointestinaI: No nausea, vomiting, diarrhea, abdominal pain, fecal incontinence Genitourinary:  No dysuria, urinary retention or frequency Musculoskeletal:  No neck pain, back pain Integumentary: No rash, pruritus, skin lesions Neurological: as above Psychiatric: No depression, insomnia, anxiety Endocrine: No palpitations, fatigue, diaphoresis, mood swings, change in appetite, change in weight, increased thirst Hematologic/Lymphatic:  No anemia, purpura, petechiae. Allergic/Immunologic: no itchy/runny eyes, nasal congestion, recent allergic reactions, rashes  PHYSICAL EXAM: Vitals:   07/21/17 1051  BP: 132/68  Pulse: 60  Resp: 18   General: No acute distress Head:  Normocephalic/atraumatic Neck: supple, no paraspinal tenderness, full range of motion Heart:  Regular rate and rhythm Lungs:  Clear to auscultation bilaterally Back: No paraspinal tenderness Skin/Extremities: No rash, no edema Neurological Exam: alert and oriented to person, place, and time. No aphasia or dysarthria. Fund of knowledge is appropriate.  Recent and remote memory are intact.  Attention and concentration are normal.    Able to name objects and repeat phrases. Cranial nerves: Pupils equal, round, reactive to light.  Extraocular movements intact with no nystagmus. Visual fields full. Facial sensation intact. No facial asymmetry. Tongue, uvula, palate midline.  Motor: Bulk and tone normal, muscle strength 5/5 throughout with no pronator drift.  Sensation to light touch intact.  No extinction to double simultaneous stimulation.  Deep tendon  reflexes 2+ throughout, toes downgoing.  Finger to nose testing intact.  Gait narrow-based and steady, able to tandem walk adequately.  Romberg negative.  IMPRESSION: This is a very pleasant 72 yo RH man with a history of focal epilepsy of right mesal temporal or right mesial frontal onset. MRI brain showed subtle findings consistent with right mesial temporal sclerosis. PET scan showed right mesial temporal hypometabolism. EEGs in the past showed right temporal spikes and TIRDA, EMU admission captured seizures that appeared to localize to the right temporal area although clinically suggestive of frontal onset. He feels Onfi has been the has been the most helpful for his seizures, he has around 2 nocturnal seizures a month. He is very concerned about his memory and how Onfi (benzodiazepines) can worsen memory, he would like to taper off medication. He is scheduled for Neurocognitive testing in May, we agreed to hold off on medication changes until after memory testing, then discuss potential medication changes. He is aware of [place] driving laws to stop driving after a seizure until 6 months seizure-free. He will follow-up in 4 months and knows to call for any changes.   Thank you for allowing me to participate in his care.  Please do not hesitate to call for any questions or concerns.  The duration of this appointment visit was 24 minutes of face-to-face time with the patient.  Greater than 50% of this time was spent in counseling, explanation of diagnosis, planning of further management, and coordination of care.   Ellouise Newer, M.D.   CC: Dr. Dagmar Hait

## 2017-07-24 ENCOUNTER — Telehealth: Payer: Self-pay | Admitting: Neurology

## 2017-07-24 NOTE — Telephone Encounter (Signed)
Patient needs to talk to someone about the medication clobazam. He states that the Generic of the medication is not cheaper than the name brand and wants to talk to someone

## 2017-07-30 ENCOUNTER — Other Ambulatory Visit: Payer: Self-pay

## 2017-07-30 MED ORDER — CLOBAZAM 10 MG PO TABS: ORAL_TABLET | ORAL | 3 refills | Status: DC

## 2017-07-30 NOTE — Telephone Encounter (Signed)
Rx printed, signed by Dr. Tomi Likens for Dr. Delice Lesch and faxed to [place].  lmom making pt aware.

## 2017-07-30 NOTE — Telephone Encounter (Signed)
Patient is calling back about the medication Clobazam. He wants [place] to call in a RX for the Generic to [facility] in [place]. And cancel the one at the [facility] for the Name brand of the medication. Please call patient as he would like to know when this is done [phone]

## 2017-08-11 ENCOUNTER — Telehealth: Payer: Self-pay | Admitting: Neurology

## 2017-08-11 NOTE — Telephone Encounter (Signed)
Pt's wife called regarding pt and the seizures he has been having, stated pt had a good nights sleep and would like to discuss about where to go from here  Cell [phone]

## 2017-08-11 NOTE — Telephone Encounter (Signed)
Spoke with pt's wife.  She states that after pt's last seizure yesterday, she gave him a Clonazepam - he rested for a little bit yesterday evening and then slept for 9 1/2 hours last night.  She states that in the past pt has had insomnia, almost manic insomnia following a good nights rest post seizure.  Asked about giving his Clonazepam in case this happens today/tonight.  Advised to give half tablet and if pt does not seem better after an hour to give second half.  pts wife and pt agreeable.

## 2017-09-07 ENCOUNTER — Encounter: Payer: Self-pay | Admitting: Psychology

## 2017-09-07 ENCOUNTER — Ambulatory Visit (INDEPENDENT_AMBULATORY_CARE_PROVIDER_SITE_OTHER): Payer: Medicare Other | Admitting: Psychology

## 2017-09-07 ENCOUNTER — Ambulatory Visit: Payer: Medicare Other | Admitting: Psychology

## 2017-09-07 DIAGNOSIS — G40219 Localization-related (focal) (partial) symptomatic epilepsy and epileptic syndromes with complex partial seizures, intractable, without status epilepticus: Secondary | ICD-10-CM

## 2017-09-07 DIAGNOSIS — R413 Other amnesia: Secondary | ICD-10-CM

## 2017-09-07 NOTE — Progress Notes (Addendum)
   Neuropsychology Note  John Holt completed 60 minutes of neuropsychological testing with technician, Milana Kidney, BS, under the supervision of Dr. Macarthur Critchley, Licensed Psychologist. The patient did not appear overtly distressed by the testing session, per behavioral observation or via self-report to the technician. Rest breaks were offered.   Clinical Decision Making: In considering the patient's current level of functioning, level of presumed impairment, nature of symptoms, emotional and behavioral responses during the interview, level of literacy, and observed level of motivation/effort, a battery of tests was selected and communicated to the psychometrician.  Communication between the psychologist and technician was ongoing throughout the testing session and changes were made as deemed necessary based on patient performance on testing, technician observations and additional pertinent factors such as those listed above.  [name] will return within approximately 2 weeks for an interactive feedback session with Dr. Si Raider at which time his test performances, clinical impressions and treatment recommendations will be reviewed in detail. The patient understands he can contact our office should he require our assistance before this time.  25 minutes spent performing neuropsychological evaluation services/clinical decision making (psychologist). [CPT 80998] 60 minutes spent face-to-face with patient administering standardized tests, 30 minutes spent scoring (technician). [CPT [id number], 33825]  Full report to follow.

## 2017-09-07 NOTE — Progress Notes (Signed)
NEUROBEHAVIORAL STATUS EXAM   Name: John Holt Date of Birth: Mar 24, 1946 Date of Interview: 09/07/2017  Reason for Referral:  John Holt is a 72 y.o. right-handed male who is referred for neuropsychological evaluation by Dr. Ellouise Holt of [facility] due to concerns about memory loss. This patient is unaccompanied in the office for today's appointment.  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 He is currently followed by Dr. Delice Holt. Per her notes, MRI brain showed subtle findings consistent with right mesial temporal sclerosis, and PET scan showed right mesial temporal hypometabolism. EEGs in the past showed right temporal spikes and TIRDA. EMU admission captured seizures that appeared to localize to the right temporal area although clinically suggestive of frontal onset. He has been on several different medications for seizures over the years. He is currently on Onfi and feels it has been the most helpful for his seizures. He was having only about 2 nocturnal seizures a month but more recently started having occasional daytime seizures. Most recently he had a seizure on on 09/01/2017 while undergoing CT scan at [place]. He is concerned that Norphlet may be contributing to memory loss and is interested in lowering the dose if that is the case. Per Dr. Amparo Holt notes, the patient has had neurocognitive testing in the past (report not available for her review); however, the patient states he does not think he has ever had this type of evaluation.  Dr. Julieanne Holt reports gradual onset of memory/cognitive decline over several years, "at least a decade". Currently he reports concerns about short term memory in that he will forget details of recent conversations or events, not recall that he has read an article already, and not recall the content or name of a movie he saw two weeks ago. He  reports his wife has expressed concern over the fact that he does not recognize acquaintances they both know. He denies difficulty concentrating or increased distractibility. He is not losing or misplacing things any more than he ever did. He manages all complex ADLs, but is not driving currently due to recent daytime seizures. He generally does well keeping up with his appointments but did miss a doctor's appointment for the first time last week, although he was very busy with moving into a new home at the time. He does not forget to take his seizure medication, but he does forget to take vitamins or magnesium on occasion. He does the cooking and grocery shopping and denies any difficulty with this. He manages the bills and finances and denies any problems with this, no missed or late bill payments. 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 He does not notice any problems with his professional work or reasoning abilities. His wife apparently notices slowed processing, but he is not aware of this. He denies word finding difficulty in conversational speech but then displays this on occasion during the course of our interview (and is aware of it). He notes he has never been good with navigation or driving directions and has always used maps/GPS. He has never gotten lost. He is using [place] and the bus system since he is not currently driving, and he denies any difficulty utilizing or coordinating these services.  He has a history of bladder cancer, diagnosed 01/2017. He underwent tumor resection and chemotherapy. He is now done with chemotherapy  and there has been no recurrence of cancer. Physically, he states he feels "just fine". He exercises regularly (walking, swimming). He has not had trouble with balance or falls. He denies any sleep difficulty. He finds he now needs more sleep (9 hours), but he does not get overly tired during the day if he gets 9 hours of sleep. He lost  10-15 lbs during chemotherapy treatment and has been unable to gain it back.   He has a history of concussion when he fell off a ladder 15 years ago. He denied any cognitive sequelae.   He denies prior psychiatric history. He does note that during daytime seizures he has apparently demonstrated unusual and "embarrassing" activity (he has no memory for seizure events), and on one occasion this led to a psychiatric hospitalization. He denies any history of hallucinations. Records indicate a history of post-ictal mania and psychosis.  He reports his mood is stable, and notes that his wife would say he has been more "temperamental" in the past 3 weeks as they have been moving residences and dealing with the stress of that. However, he denies any significant irritability or agitation. He denies suicidal ideation or intention. He maintains an active social life. He also volunteers with an adult literacy program.  Of note, he was previously consuming 2-3 glasses of wine nightly but has stopped drinking recently given increased seizures.    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 He retired from [employer] in 2009 or 2010. He is still writing (working on his third book). Marital history: Married x2. Divorced from first marriage in 2003. Has been married to his current wife for about 15 years. He has two adult children from his first marriage, ages 77 and 39. He notes his 34 yo daughter has substance abuse problems, and one of his two grandchildren died as a result of drug overdose 3 years ago. Alcohol: As noted above, he used to drink 2-3 glasses of wine (or 2-3 beers) nightly but recently stopped drinking while he undergoes further evaluation for seizures/memory loss. Tobacco: Former smoker, quit 1973 per records.   Medical History: Past Medical History:    Diagnosis Date  . Anxiety disorder   . Bipolar 1 disorder, mixed ([place])    w/ hx psychosis/ dissociative reactions/  suicide ideations and attempt  . BPH (benign prostatic hypertrophy) with urinary obstruction   . Frequency of urination   . History of adenomatous polyp of colon   . History of closed head injury    2000-- fell off ladder--  temporary memory loss resolved  . History of panic attacks   . History of psychosis    x2  last one documented 2006  w/ hallucination  and suicide ideation  . Nocturia   . Seizures ([place])   . Sigmoid diverticulosis   . Temporal lobe epilepsy syndrome [facility]) neurologist-  dr Leta Baptist--  Linden Dolin one every 2 weeks "legs jerking around, per wife , pt unaware he's doing this"   localization-related right temporal lobe --  mostly nocturnal w/ bilateral lower extremitity movement, staring and moaning then dissorietation afterwards (last daytime seizure June 2013)  . Urge urinary incontinence   . Vesicoureteral reflux, bilateral   . Wears glasses       Current Medications:  Outpatient Encounter Medications as of 09/07/2017  Medication Sig  . cloBAZam (ONFI) 10 MG tablet Take 3 tablets  at night  . clonazePAM (KLONOPIN) 0.5 MG tablet Take 1 tablet PO PRN 2 seizures in one day or signs of postictal psychosis.  May repeat once in 24 hours.  . Magnesium 500 MG TABS Take 30 mg by mouth 2 (two) times daily.  . Multiple Vitamin (MULTIVITAMIN) tablet Take 1 tablet by mouth daily.  . tadalafil (CIALIS) 5 MG tablet Take 20 mg by mouth daily as needed. Reported on 09/17/2015   No facility-administered encounter medications on file as of 09/07/2017.      Behavioral Observations:   Appearance: Neatly and appropriately dressed and groomed Gait: Ambulated independently, no gross abnormalities observed Speech: Fluent; normal rate, rhythm and volume although some pausing and mild word finding difficulty. Thought process: Linear, goal directed Affect: Normal range,  appears euthymic Interpersonal: Pleasant, appropriate   60 minutes spent face-to-face with patient completing neurobehavioral status exam. 40 minutes spent integrating medical records/clinical data and completing this report. CPT code [id number] unit; [id number] unit.   TESTING: There is medical necessity to proceed with neuropsychological assessment as the results will be used to aid in differential diagnosis and clinical decision-making and to inform specific treatment recommendations. Per the patient and medical records reviewed, there has been a change in cognitive functioning and a reasonable suspicion of neurocognitive disorder (rule out MCI, could be related to underlying seizure disorder, seizure medication, and/or other neurologic process).  Clinical Decision Making: In considering the patient's current level of functioning, level of presumed impairment, nature of symptoms, emotional and behavioral responses during the interview, level of literacy, and observed level of motivation, a battery of tests was selected and communicated to the psychometrician.   Following the clinical interview/neurobehavioral status exam, the patient completed this full battery of neuropsychological testing with my psychometrician under my supervision (see separate note).   PLAN: The patient will return to see me for a follow-up session at which time his test performances and my impressions and treatment recommendations will be reviewed in detail.  Evaluation ongoing; full report to follow.

## 2017-09-28 ENCOUNTER — Telehealth: Payer: Self-pay | Admitting: Neurology

## 2017-09-28 NOTE — Telephone Encounter (Signed)
LMOM asking for return call 

## 2017-09-28 NOTE — Telephone Encounter (Signed)
Patient and his wife were both on the phone and stating that patient is having a lot of seizure activity last week he had 4. They both state that is not normal for him and wanted to talk to someone about this and make appt to see Dr Delice Lesch. I got them in on 09-30-17 but they are very concerned and wanted to be seen ASAP and sooner than 09-30-17 if they could be. Please call patient either at home or cell phone

## 2017-09-29 NOTE — Telephone Encounter (Signed)
Spoke with pt's wife.  She states that she believes that pt has built a tolerance to his ONFI.  States that pt goes through a cycle; will be fine for 2 weeks, then will experience a nocturnal seizure, followed by a state of confusion 2 days later, then a drop seizure 2 days after that.  Pt has had drop seizures in the bathroom, kitchen, a parking lot, and while receiving an XRAY.  All episodes happen between the hours of 3pm and 7pm.  Wife states "you can set your watch to them."    Pt's wife goes on to state that she is seeing a decline in pt's memory and cognition.  Pt seems to be "slowed down".  States that he avoids going out or talking with family/friends in fear of forgetting something.  Wife says that pt has been frustrated about his memory to the point of crying.    Wife would like referral to research hospital, however not [facility] or [facility].  Does not mind going out of state for this.  She does state that she would like pt to continue seeing Dr. Delice Lesch.  She believes that pt and Dr. Delice Lesch have a great relationship and does not want that to end, just thinks that maybe a research hospital could offer more than Dr. Delice Lesch in certain aspects.    Pt has appointment scheduled for tomorrow.  Wife just wanted to let Dr. Delice Lesch know these things prior because pt has a habit of downplaying his symptoms.

## 2017-09-30 ENCOUNTER — Other Ambulatory Visit: Payer: Self-pay

## 2017-09-30 ENCOUNTER — Ambulatory Visit: Payer: Medicare Other | Admitting: Neurology

## 2017-09-30 ENCOUNTER — Encounter: Payer: Self-pay | Admitting: Neurology

## 2017-09-30 VITALS — BP 100/58 | HR 65 | Ht 68.0 in | Wt 137.0 lb

## 2017-09-30 DIAGNOSIS — G40219 Localization-related (focal) (partial) symptomatic epilepsy and epileptic syndromes with complex partial seizures, intractable, without status epilepticus: Secondary | ICD-10-CM | POA: Diagnosis not present

## 2017-09-30 MED ORDER — ZONISAMIDE 100 MG PO CAPS: ORAL_CAPSULE | ORAL | 6 refills | Status: DC

## 2017-09-30 MED ORDER — CLOBAZAM 10 MG PO TABS: ORAL_TABLET | ORAL | 3 refills | Status: DC

## 2017-09-30 NOTE — Progress Notes (Signed)
NEUROLOGY FOLLOW UP OFFICE NOTE  John Holt [id number] May 07, 1945  HISTORY OF PRESENT ILLNESS: I had the pleasure of seeing Fender Herder in follow-up in the neurology clinic on 09/30/2017.  The patient was last seen 2 months ago for intractable epilepsy arising from the righ mesial temporal or mesial frontal region.  He is again accompanied by his wife who helps supplement the history today. Since his last visit, he has not been doing well. He is more depressed today about his memory, he is having more difficulty with his coping skills. He had reduced Onfi to 30mg  qhs in January 2019. His wife reports that there has been a change in pattern with his seizures. In the past, he would have epigatric sensations with a sinking feeling in his stomach area. This had pretty much gone away with the [place], but over the past 3 weeks, he has had the sinking feeling a couple of times. His usual pattern for nocturnal seizures is one every 2 weeks or so. Since the beginning of March, he has been having seizures every week. Things have been stressful, they recently moved houses, and he has not been driving. In March, his wife heard a sound in the bathroom and found him slumped forward. She did not notice any shaking, but he was confused after and started undressing, like his prior seizures. They were moving last April, and while in the parking lot he started having shaking of his right arm and started going forward. On 09/01/17 while having a chest xray, he was described to have a tonic-clonic seizure for a few seconds with post-ictal confusion. His wife reports he was slumping to the floor and was confused, combative. He was brought to the ER with no medication changes made. His wife reports seizures where he says weird things off the wall, nonsensical, unable to find a word for 30 seconds. Last seizure occurred a week ago, his wife reports there has been less stress. His wife reports all of his seizures occur in the  early evening hours. His thought process has slowed down and it is very much more pronounced. He has no sense of time. When he is cooking, he has to look back and forth at the recipe. He reports his powers of concentration and attention are less. He had Neurocognitive testing recently, results unavailable for review. His wife reports he has been frustrated about his memory to the point of crying.   HPI 05/13/2017: This is a very pleasant 72 yo RH man with a history of focal epilepsy of right mesial temporal or right mesial frontal onset. He had been seeing epileptologist Dr. Jacelyn Grip for several years, records were reviewed. He reports staring episodes in childhood. In his 60s, he recalls having episodes of a sinking feeling in his stomach with deja vu occurring around once a month initially. When he was started on seizure medications, these episodes occur rarely, around 1-2 a year. His wife started noticing possible symptoms in his 22s, they were in a meeting and she noticed him swaying back and forth but seemed unaware of it. He started having nocturnal spells more than 15 years ago, where he would have "leg thrusting" (bicycling type leg movements), lip smacking, often followed by arousal with disorientation, need to urinate and walking around (at times resembling sleepwalking). He has had episodes where he would have a cluster of seizures then become paranoid and manic with significant post-ictal psychosis. He usually has nocturnal seizures every 3 weeks or so. His  wife would give him prn clonazepam when he has clusters, and this has helped "zap" them. He has not needed clonazepam in at least 1.5 years. He has rare daytime seizures, his wife recalls the seizures in 2006 and 2013 with post-ictal psychosis, there is note of a spell of undressing and confusion in the [employer] at [place] in 2015. In the past he has been on Lamictal, Depakote, Gabapentin. Gabapentin has been tapered off. He has been taking Onfi for  several years and feels this has been the most helpful. He was recently diagnosed with bladder cancer in October 2018 and underwent resection and chemotherapy. They report that towards the end of chemo, he had some incidents with seizures. He has no recollection of the events. His wife reports that after he had his second infusion and the PICC line was taken out, he started having slight feet movements and was staring and unresponsive. He had another episode while shoveling snow, he came in and started talking and his speech became garbled, "words were not right" for 60 seconds. Three days after on the way home from chemo, he had a similar episode with his speech. He was talking about Aristotle then his speech became garbled. The last daytime event was on 04/28/17. He started swaying side to side, speech was garbled, and he was staring off for a few minutes. Due to these daytime episodes, his Onfi dose was temporarily increased to 40mg  qhs. They report he has not had any nocturnal seizures since November, which is unusual for him. The higher dose of Onfi knocks him out. They report he is done with chemotherapy and will be seeing his oncologist to discuss next steps.  He denies any headaches, dizziness, diplopia, dysarthria/dysphagia, neck/back pain, focal numbness/tingling/weakness, bowel/bladder dysfunction, no falls. He had muscle cramps, taking magnesium supplements has helped. His memory is what bothers him a lot. They mostly notice problems with his "narrative memory." He would not recall watching a movie 2-3 weeks prior. He cannot recall anecdotes. He would make notes on an article, and later find that he had already made notes on the same article previously. He is able to remember abstract concepts from dense philosophy books he reads. His spatial memory is not good. He denies missing medications. Every once in a while he forgets his medications (twice in the past month). He drives only to the grocery and  denies getting lost. He has had Neuropsychological testing, report unavailable for review.  Prior AEDs: Depakote, Lamictal, Tegretol, Trileptal, Keppra, Vimpat, Gabapentin  Epilepsy Risk Factors: His father had rare spells 1-2 times a year of "zonking out." He and his brother had "spasms" as babies, none after age 8/3. He recalls having a head injury in his teenage years. Otherwise he had a normal birth and early development, no history of febrile convulsions, CNS infections, or neurosurgical procedures.   Prior workup: MRI brain 01/2014: subtle findings consistent with right mesial temporal sclerosis EEGs: EEG 2009 - right temporal spike activity and right TIRDA EEG 01/2009 - normal EEG 07/2012 - normal  MRI 2006 - normal  PET Brain 2016 - right mesial temporal hypometabolism.  EMU 04/25/2014 - 05/04/2014 6 events captured on camera, 1 off camera - repeated neck extension and flexion, followed by pelvic thrusting and body rocking movements. Appear to localize to the right temporal region, although clinically suggestive of frontal onset. Patient taken off Depakote and Lamictal and switched to high dose gabapentin  PAST MEDICAL HISTORY: Past Medical History:  Diagnosis Date  .  Anxiety disorder   . Bipolar 1 disorder, mixed ([place])    w/ hx psychosis/ dissociative reactions/  suicide ideations and attempt  . BPH (benign prostatic hypertrophy) with urinary obstruction   . Frequency of urination   . History of adenomatous polyp of colon   . History of closed head injury    2000-- fell off ladder--  temporary memory loss resolved  . History of panic attacks   . History of psychosis    x2  last one documented 2006  w/ hallucination  and suicide ideation  . Nocturia   . Seizures ([place])   . Sigmoid diverticulosis   . Temporal lobe epilepsy syndrome [facility]) neurologist-  dr Leta Baptist--  Linden Dolin one every 2 weeks "legs jerking around, per wife , pt unaware he's doing this"    localization-related right temporal lobe --  mostly nocturnal w/ bilateral lower extremitity movement, staring and moaning then dissorietation afterwards (last daytime seizure June 2013)  . Urge urinary incontinence   . Vesicoureteral reflux, bilateral   . Wears glasses     MEDICATIONS: Current Outpatient Medications on File Prior to Visit  Medication Sig Dispense Refill  . cloBAZam (ONFI) 10 MG tablet Take 3 tablets at night 270 tablet 3  . clonazePAM (KLONOPIN) 0.5 MG tablet Take 1 tablet PO PRN 2 seizures in one day or signs of postictal psychosis.  May repeat once in 24 hours.    . Magnesium 500 MG TABS Take 30 mg by mouth 2 (two) times daily.    . Multiple Vitamin (MULTIVITAMIN) tablet Take 1 tablet by mouth daily.    . tadalafil (CIALIS) 5 MG tablet Take 20 mg by mouth daily as needed. Reported on 09/17/2015     No current facility-administered medications on file prior to visit.     ALLERGIES: No Known Allergies  FAMILY HISTORY: Family History  Problem Relation Age of Onset  . Seizures Father   . Heart disease Father   . Cancer Father        prostat  . Cancer Mother        stomach  . Stomach cancer Mother   . Colon cancer Neg Hx   . Colon polyps Neg Hx   . Esophageal cancer Neg Hx   . Rectal cancer Neg Hx     SOCIAL HISTORY: Social History   Socioeconomic History  . Marital status: Married    Spouse name: [name]  . Number of children: 2  . Years of education: PhD  . 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 Financial resource strain: Not on file  . Food insecurity:    Worry: Not on file    Inability: Not on file  . Transportation needs:    Medical: Not on file    Non-medical: Not on file  Tobacco Use  . Smoking status: Former Smoker    Packs/day: 1.00    Years: 15.00    Pack years: 15.00    Types: Cigarettes    Last attempt to quit: 04/03/1972    Years since  quitting: 45.5  . Smokeless tobacco: Never Used  Substance and Sexual Activity  . Alcohol use: Yes    Alcohol/week: 8.4 oz    Types: 14 Glasses of wine per week    Comment: 2 wine daily  . Drug use: No  . Sexual activity: Not on file  Lifestyle  . Physical  activity:    Days per week: Not on file    Minutes per session: Not on file  . Stress: Not on file  Relationships  . Social connections:    Talks on phone: Not on file    Gets together: Not on file    Attends religious service: Not on file    Active member of club or organization: Not on file    Attends meetings of clubs or organizations: Not on file    Relationship status: Not on file  . Intimate partner violence:    Fear of current or ex partner: Not on file    Emotionally abused: Not on file    Physically abused: Not on file    Forced sexual activity: Not on file  Other Topics Concern  . Not on file  Social History Narrative      Pt lives at home with his spouse.   Caffeine Use- 2 cups daily    REVIEW OF SYSTEMS: Constitutional: No fevers, chills, or sweats, no generalized fatigue, change in appetite Eyes: No visual changes, double vision, eye pain Ear, nose and throat: No hearing loss, ear pain, nasal congestion, sore throat Cardiovascular: No chest pain, palpitations Respiratory:  No shortness of breath at rest or with exertion, wheezes GastrointestinaI: No nausea, vomiting, diarrhea, abdominal pain, fecal incontinence Genitourinary:  No dysuria, urinary retention or frequency Musculoskeletal:  No neck pain, back pain Integumentary: No rash, pruritus, skin lesions Neurological: as above Psychiatric: No depression, insomnia, anxiety Endocrine: No palpitations, fatigue, diaphoresis, mood swings, change in appetite, change in weight, increased thirst Hematologic/Lymphatic:  No anemia, purpura, petechiae. Allergic/Immunologic: no itchy/runny eyes, nasal congestion, recent allergic reactions, rashes  PHYSICAL  EXAM: Vitals:   09/30/17 1335  BP: (!) 100/58  Pulse: 65  SpO2: 98%   General: No acute distress Head:  Normocephalic/atraumatic Neck: supple, no paraspinal tenderness, full range of motion Heart:  Regular rate and rhythm Lungs:  Clear to auscultation bilaterally Back: No paraspinal tenderness Skin/Extremities: No rash, no edema Neurological Exam: alert and oriented to person, place, and time. No aphasia or dysarthria. Fund of knowledge is appropriate.  Recent and remote memory are intact.  Attention and concentration are normal.    Able to name objects and repeat phrases. Cranial nerves: Pupils equal, round, reactive to light.  Extraocular movements intact with no nystagmus. Visual fields full. Facial sensation intact. No facial asymmetry. Tongue, uvula, palate midline.  Motor: Bulk and tone normal, muscle strength 5/5 throughout with no pronator drift.  Sensation to light touch intact.  No extinction to double simultaneous stimulation.  Deep tendon reflexes 2+ throughout, toes downgoing.  Finger to nose testing intact.  Gait narrow-based and steady, able to tandem walk adequately.  Romberg negative.  IMPRESSION: This is a very pleasant 72 yo RH man with a history of focal epilepsy of right mesial temporal or right mesial frontal onset. MRI brain at [facility] showed subtle findings consistent with right mesial temporal sclerosis. PET scan showed right mesial temporal hypometabolism. EEGs in the past showed right temporal spikes and TIRDA, EMU admission captured seizures that appeared to localize to the right temporal area although clinically suggestive of frontal onset. In the past, he felt Onfi has been the has been the most helpful for his seizures, he was having an average of 2 seizures a month. Over the past 3 months, there has been a significant increase in seizures, as well as cognitive changes. MRI brain with and without contrast will be ordered to  assess for underlying structural abnormality.  We had an extensive discussion about seizures, medications, treatment options. He is convinced Onfi is causing his cognitive issues and would like to stop the medication. We discussed that he continues to have seizures and needs to have another seizure medication on board before weaning off [place]. We discussed the option of VNS. He had previously been seeing Dr. Jacelyn Grip at [facility] and LITT of the right hippocampus had previously been discussed, but he opted for medication management. We discussed VNS with his concerns for systemic side effects from medications. This does not preclude brain surgery in the future. He would like to try medication for now and is agreeable to starting Zonisamide 100mg  qhs x 2 weeks, then increase to 200mg  qhs x 2 weeks, then increase to 300mg  qhs. Once on this dose, he will reduce Onfi to 20mg  qhs. His wife is interested in going to the [facility] for another opinion, referral will be sent. He has a follow-up with Dr. Si Raider to discuss Neurocognitive results. He is aware of [place] driving laws to stop driving after a seizure until 6 months seizure-free. He will follow-up in 3 months and knows to call for any changes.   Thank you for allowing me to participate in his care.  Please do not hesitate to call for any questions or concerns.  The duration of this appointment visit was 45 minutes of face-to-face time with the patient.  Greater than 50% of this time was spent in counseling, explanation of diagnosis, planning of further management, and coordination of care.   Ellouise Newer, M.D.   CC: Dr. Dagmar Hait

## 2017-09-30 NOTE — Patient Instructions (Addendum)
1. Schedule MRI brain with and without contrast  We have sent a referral to [facility] for your MRI and they will call you directly to schedule your appt. They are located at [place]. If you need to contact them directly please call [phone].   2. Start Zonisamide 100mg : take 1 capsule at night for 2 weeks, then increase to 2 capsules at night for 2 weeks, then increase to 3 caps at night and continue 3. After being on the 300mg  dose of Zonisamide, start reducing clobazam to 20mg  at night 4. Referral will be sent to the [facility] 5. Follow-up in 3 months, call for any changes  Seizure Precautions: 1. If medication has been prescribed for you to prevent seizures, take it exactly as directed.  Do not stop taking the medicine without talking to your doctor first, even if you have not had a seizure in a long time.   2. Avoid activities in which a seizure would cause danger to yourself or to others.  Don't operate dangerous machinery, swim alone, or climb in high or dangerous places, such as on ladders, roofs, or girders.  Do not drive unless your doctor says you may.  3. If you have any warning that you may have a seizure, lay down in a safe place where you can't hurt yourself.    4.  No driving for 6 months from last seizure, as per [facility].   Please refer to the following link on the [place] website for more information: http://[email or web address]/[email or web address]   5.  Maintain good sleep hygiene. Avoid alcohol.  6.  Contact your doctor if you have any problems that may be related to the medicine you are taking.  7.  Call 911 and bring the patient back to the ED if:        A.  The seizure lasts longer than 5 minutes.       B.  The patient doesn't awaken shortly after the seizure  C.  The patient has new problems such as difficulty seeing, speaking or moving  D.  The patient was injured during  the seizure  E.  The patient has a temperature over 102 F (39C)  F.  The patient vomited and now is having trouble breathing

## 2017-10-09 ENCOUNTER — Encounter: Payer: Self-pay | Admitting: Neurology

## 2017-10-09 NOTE — Progress Notes (Signed)
NEUROPSYCHOLOGICAL EVALUATION   Name:    John Holt  Date of Birth:   Dec 16, 1945 Date of Interview:  09/07/2017 Date of Testing:  09/07/2017   Date of Feedback:  10/12/2017       Background Information:  Reason for Referral:  John Holt is a 72 y.o. male referred by Dr. Ellouise Newer to assess his current level of cognitive functioning and assist in differential diagnosis. The current evaluation consisted of a review of available medical records, an interview with the patient, and the completion of a neuropsychological testing battery. Informed consent was obtained.  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 He is currently followed by Dr. Delice Lesch. Per her notes, MRI brain showed subtle findings consistent with right mesial temporal sclerosis, and PET scan showed right mesial temporal hypometabolism. EEGs in the past showed right temporal spikes and TIRDA. EMU admission captured seizures that appeared to localize to the right temporal area although clinically suggestive of frontal onset. He has been on several different medications for seizures over the years. He is currently on Onfi and feels it has been the most helpful for his seizures. He was having only about 2 nocturnal seizures a month but more recently started having occasional daytime seizures. Most recently he had a seizure on on 09/01/2017 while undergoing CT scan at [facility]. He is concerned that [place] may be contributing to memory loss and is interested in lowering the dose if that is the case. Per Dr. Amparo Bristol notes, the patient has had neurocognitive testing in the past (report not available for her review); however, the patient states he does not think he has ever had this type of evaluation.  Dr. Julieanne Manson reports gradual onset of memory/cognitive decline over several years, "at least a decade". Currently he reports concerns  about short term memory in that he will forget details of recent conversations or events, not recall that he has read an article already, and not recall the content or name of a movie he saw two weeks ago. He reports his wife has expressed concern over the fact that he does not recognize acquaintances they both know. He denies difficulty concentrating or increased distractibility. He is not losing or misplacing things any more than he ever did. He manages all complex ADLs, but is not driving currently due to recent daytime seizures. He generally does well keeping up with his appointments but did miss a doctor's appointment for the first time last week, although he was very busy with moving into a new home at the time. He does not forget to take his seizure medication, but he does forget to take vitamins or magnesium on occasion. He does the cooking and grocery shopping and denies any difficulty with this. He manages the bills and finances and denies any problems with this, no missed or late bill payments. 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 He does not notice any problems with his professional work or reasoning abilities. His wife apparently notices slowed processing, but he is not aware of this. He denies word finding difficulty in conversational speech but then displays this on occasion during the course of our interview (and is aware of it). He notes he has never been good with navigation or driving directions and has always used maps/GPS. He has never gotten lost. He is using [employer], mining  and the bus system since he is not currently driving, and he denies any difficulty utilizing or coordinating these services.  He has a history of bladder cancer, diagnosed 01/2017. He underwent tumor resection and chemotherapy. He is now done with chemotherapy and there has been no recurrence of cancer. Physically, he states he feels "just fine". He exercises regularly (walking,  swimming). He has not had trouble with balance or falls. He denies any sleep difficulty. He finds he now needs more sleep (9 hours), but he does not get overly tired during the day if he gets 9 hours of sleep. He lost 10-15 lbs during chemotherapy treatment and has been unable to gain it back.   He has a history of concussion when he fell off a ladder 15 years ago. He denied any cognitive sequelae.   He denies prior psychiatric history. He does note that during daytime seizures he has apparently demonstrated unusual and "embarrassing" activity (he has no memory for seizure events), and on one occasion this led to a psychiatric hospitalization. He denies any history of hallucinations. Records indicate a history of post-ictal mania and psychosis.  He reports his mood is stable, and notes that his wife would say he has been more "temperamental" in the past 3 weeks as they have been moving residences and dealing with the stress of that. However, he denies any significant irritability or agitation. He denies suicidal ideation or intention. He maintains an active social life. He also volunteers with an adult literacy program.  Of note, he was previously consuming 2-3 glasses of wine nightly but has stopped drinking recently given increased seizures.    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 He retired from [employer] in 2009 or 2010. He is still writing (working on his third book). Marital history: Married x2. Divorced from first marriage in 2003. Has been married to his current wife for about 15 years. He has two adult children from his first marriage, ages 66 and 72. He notes his 54 yo daughter has substance abuse problems, and one of his two grandchildren died as a result of drug overdose 3 years ago. Alcohol: As noted above, he used to drink 2-3 glasses  of wine (or 2-3 beers) nightly but recently stopped drinking while he undergoes further evaluation for seizures/memory loss. Tobacco: Former smoker, quit 1973 per records.    Medical History:  Past Medical History:  Diagnosis Date  . Anxiety disorder   . Bipolar 1 disorder, mixed ([place])    w/ hx psychosis/ dissociative reactions/  suicide ideations and attempt  . BPH (benign prostatic hypertrophy) with urinary obstruction   . Frequency of urination   . History of adenomatous polyp of colon   . History of closed head injury    2000-- fell off ladder--  temporary memory loss resolved  . History of panic attacks   . History of psychosis    x2  last one documented 2006  w/ hallucination  and suicide ideation  . Nocturia   . Seizures ([place])   . Sigmoid diverticulosis   . Temporal lobe epilepsy syndrome [facility]) neurologist-  dr Leta Baptist--  Linden Dolin one every 2 weeks "legs jerking around, per wife , pt unaware he's doing this"   localization-related right temporal lobe --  mostly nocturnal w/ bilateral lower extremitity movement, staring and moaning then dissorietation afterwards (last daytime seizure June 2013)  .  Urge urinary incontinence   . Vesicoureteral reflux, bilateral   . Wears glasses     Current medications:  Outpatient Encounter Medications as of 10/12/2017  Medication Sig  . cloBAZam (ONFI) 10 MG tablet Take 3 tablets at night  . clonazePAM (KLONOPIN) 0.5 MG tablet Take 1 tablet PO PRN 2 seizures in one day or signs of postictal psychosis.  May repeat once in 24 hours.  . Magnesium 500 MG TABS Take 30 mg by mouth 2 (two) times daily.  . Multiple Vitamin (MULTIVITAMIN) tablet Take 1 tablet by mouth daily.  . tadalafil (CIALIS) 5 MG tablet Take 20 mg by mouth daily as needed. Reported on 09/17/2015  . zonisamide (ZONEGRAN) 100 MG capsule Take 1 capsule at night for 2 weeks, then increase to 2 capsules at night, then increase to 3 capsules at night and continue   No  facility-administered encounter medications on file as of 10/12/2017.      Current Examination:  Behavioral Observations:  Appearance: Neatly and appropriately dressed and groomed Gait: Ambulated independently, no gross abnormalities observed Speech: Fluent; normal rate, rhythm and volume although some pausing and mild word finding difficulty. Thought process: Linear, goal directed Affect: Normal range, appears euthymic. He did become tearful while completing a self-report inventory of depression symptoms. Interpersonal: Pleasant, appropriate Orientation: Oriented to person, place and most aspects of time (one day off on the date). Accurately named the current President but inaccurately named his immediate predecessor as "Maudie Flakes, Brooke Bonito.".   Tests Administered: . Test of Premorbid Functioning (TOPF) . Wechsler Adult Intelligence Scale-Fourth Edition (WAIS-IV): Similarities, Music therapist, Coding and Digit Span subtests . Wechsler Memory Scale-Fourth Edition (WMS-IV) Older Adult Version (ages 31-90): Logical Memory I, II and Recognition subtests  . Wisconsin Verbal Learning Test - 2nd Edition (CVLT-2) Standard Form . Repeatable Battery for the Assessment of Neuropsychological Status (RBANS) Form A:  Figure Copy and Recall subtests and Semantic Fluency subtest . Boston Naming Test (BNT) . Boston Diagnostic Aphasia Examination: Complex Ideational Material subtest . Controlled Oral Word Association Test (COWAT) . Trail Making Test A and B . Clock drawing test . Geriatric Depression Scale (GDS) 15 Item . Generalized Anxiety Disorder - 7 item screener (GAD-7)  Test Results: Note: Standardized scores are presented only for use by appropriately trained professionals and to allow for any future test-retest comparison. These scores should not be interpreted without consideration of all the information that is contained in the rest of the report. The most recent standardization samples from the  test publisher or other sources were used whenever possible to derive standard scores; scores were corrected for age, gender, ethnicity and education when available.   Test Scores:  Test Name Raw Score Standardized Score Descriptor  TOPF 68/70 SS= 129 Superior  WAIS-IV Subtests     Similarities 32/36 ss= 15 Superior  Block Design 20/66 ss= 7 Low average  Coding 48/135 ss= 10 Average  Digit Span Forward 13/16 ss= 14 Superior  Digit Span Backward 9/16 ss= 11 Average  WMS-IV Subtests     LM I 39/53 ss= 13 High average  LM II 21/39 ss= 11 Average  LM II Recognition 18/23 Cum %: 26-50 WNL  RBANS Subtests     Figure Copy 18/20 Z= 0.1 Average  Figure Recall 4/20 Z= -2 Impaired  Semantic Fluency 21 Z= 0.2 Average  CVLT-II Scores     Trial 1 4/16 Z= -1 Low average  Trial 5 8/16 Z= -0.5 Average  Trials 1-5 total 29/80 T=  41 Low average  SD Free Recall 3/16 Z= -1.5 Borderline  SD Cued  Recall 6/16 Z= -1.5 Borderline  LD Free Recall 3/16 Z= -1.5 Borderline  LD Cued Recall 4/16 Z= -2 Impaired  Recognition Hits 11/16 Z= -2 Impaired  Recognition False Positives 2 Z=-0.5 Average  Forced Choice Recognition 15/16  Impaired  BNT 55/60 T= 53 Average  BDAE Complex Ideational Material 9/12  Mildly impaired  COWAT-FAS 43 T= 51 Average  COWAT-Animals 16 T= 45 Average  Trail Making Test A  39" 0 errors T= 52 Average  Trail Making Test B  88" 0 errors T= 53 Average  Clock Drawing   WNL  GDS-15 3/15  WNL  GAD-7 5/21  Mild      Description of Test Results:  Premorbid verbal intellectual abilities were estimated to have been within the superior range based on a test of word reading. Psychomotor processing speed was average. Basic auditory attention was superior, while more complex auditory attention (ie working memory) was average. Visual-spatial construction was low average. Language abilities were variable. Specifically, confrontation naming was average, and semantic verbal fluency was  average, while auditory comprehension of complex ideational material was mildly impaired. With regard to verbal memory, encoding and acquisition of non-contextual information (i.e., 16-item word list) was low average across five learning trials. After a brief interference task, free recall was borderline (3/16 items). With semantic cueing, he recalled an additional 3 items (borderline). After a delay, free recall was borderline (3/16 items). Cued recall was impaired (4/16 items). Performance on a yes/no recognition task was impaired due to low recognition of target items. Performance on a forced choice recognition task also was impaired. Meanwhile, on another verbal memory test, encoding and acquisition of contextual auditory information (i.e., short stories) was high average. After a delay, free recall was average. Performance on a yes/no recognition task was within normal limits. With regard to non-verbal memory, delayed free recall of visual information was impaired. Executive functioning was within normal limits. Mental flexibility and set-shifting were average on Trails B. Verbal fluency with phonemic search restrictions was average. Verbal abstract reasoning was superior. Performance on a clock drawing task was normal. On a self-report measure of mood, the patient's responses were not indicative of clinically significant depression at the present time. On a self-report measure of anxiety, the patient endorsed mild generalized anxiety characterized by nervousness, excessive worries and irritability.    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 Results of cognitive testing revealed mostly normal cognitive functioning. However, he did demonstrate mildly reduced visual-spatial construction and more impaired visual memory, consistent with right hemisphere involvement and in particular mesial temporal lobe involvement. He also demonstrated impairment in verbal memory of  non-contextual information, but memory of contextual information was intact, suggesting more of a problem with frontal-subcortical networks than hippocampal consolidation dysfunction. Overall, testing performances and daily functioning are not consistent with a diagnosis of dementia. Instead a diagnosis of mild neurocognitive disorder (mild cognitive impairment) is warranted. I believe this is due to his seizure disorder based on localizing findings and also based on the correlation of increasing seizures and increasing cognitive difficulties.     Recommendations/Plan: Based on the findings of the present evaluation, the following recommendations are offered:  1. He will continue to follow up with Dr. Delice Lesch for management of seizure disorder. He is going to be starting Zonisamide and then weaning off [place]. It will be helpful to see if this change has an impact on cognitive  functioning and seizure frequency. 2. He will require the use of external memory aids to assist in recalling and retrieving information he has recently learned.  3. If there is a change in cognitive functioning (worsening or different symptoms) in the future -- especially if seizures are better controlled but he continues to have cognitive decline -- neurocognitive re-evaluation may be useful, and these test results can be used in test-retest comparison.    Feedback to Patient: John Holt and his wife returned for a feedback appointment on 10/12/2017 to review the results of his neuropsychological evaluation with this provider. 20 minutes face-to-face time was spent reviewing his test results, my impressions and my recommendations as detailed above.    Total time spent on this patient's case: 100 minutes for neurobehavioral status exam with psychologist (CPT code [phone], [phone]); 90 minutes of testing/scoring by psychometrician under psychologist's supervision (CPT codes [phone], [phone] units); 180 minutes for integration of  patient data, interpretation of standardized test results and clinical data, clinical decision making, treatment planning and preparation of this report, and interactive feedback with review of results to the patient/family by psychologist (CPT codes [phone], [phone] units).      Thank you for your referral of John Holt. Please feel free to contact me if you have any questions or concerns regarding this report.

## 2017-10-12 ENCOUNTER — Ambulatory Visit (INDEPENDENT_AMBULATORY_CARE_PROVIDER_SITE_OTHER): Payer: Medicare Other | Admitting: Psychology

## 2017-10-12 ENCOUNTER — Encounter: Payer: Self-pay | Admitting: Psychology

## 2017-10-12 DIAGNOSIS — G40219 Localization-related (focal) (partial) symptomatic epilepsy and epileptic syndromes with complex partial seizures, intractable, without status epilepticus: Secondary | ICD-10-CM | POA: Diagnosis not present

## 2017-10-12 DIAGNOSIS — R413 Other amnesia: Secondary | ICD-10-CM

## 2017-10-12 NOTE — Patient Instructions (Signed)
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 Results of cognitive testing revealed mostly normal cognitive functioning. However, he did demonstrate mildly reduced visual-spatial construction and more impaired visual memory, consistent with right hemisphere involvement and in particular mesial temporal lobe involvement. He also demonstrated impairment in verbal memory of non-contextual information, but memory of contextual information was intact, suggesting more of a problem with frontal-subcortical networks than hippocampal consolidation dysfunction. Overall, testing performances and daily functioning are not consistent with a diagnosis of dementia. Instead a diagnosis of mild neurocognitive disorder (mild cognitive impairment) is warranted. I believe this is due to his seizure disorder based on localizing findings and also based on the correlation of increasing seizures and increasing cognitive difficulties.     Recommendations/Plan:  1. Continue to follow up with Dr. Delice Lesch for management of seizure disorder.  2. Use of external memory aids will be required to assist in recalling and retrieving information you have recently learned.  3. If there is a change in cognitive functioning (worsening or different symptoms) in the future -- especially if seizures are better controlled but cognition seems to decline -- neurocognitive re-evaluation may be useful, and these test results can be used in test-retest comparison.

## 2017-10-14 ENCOUNTER — Ambulatory Visit
Admission: RE | Admit: 2017-10-14 | Discharge: 2017-10-14 | Disposition: A | Discharge status: Home / Self Care | Payer: Medicare Other | Source: Ambulatory Visit | Attending: Neurology | Admitting: Neurology

## 2017-10-14 DIAGNOSIS — G40219 Localization-related (focal) (partial) symptomatic epilepsy and epileptic syndromes with complex partial seizures, intractable, without status epilepticus: Secondary | ICD-10-CM

## 2017-10-14 MED ORDER — GADOBENATE DIMEGLUMINE 529 MG/ML IV SOLN
12.0000 mL | Freq: Once | INTRAVENOUS | Status: AC | PRN
  Administered 2017-10-14: 12 mL via INTRAVENOUS

## 2017-10-27 ENCOUNTER — Ambulatory Visit: Payer: Medicare Other | Admitting: Neurology

## 2017-10-29 ENCOUNTER — Encounter: Payer: Self-pay | Admitting: Neurology

## 2017-11-22 ENCOUNTER — Encounter: Payer: Self-pay | Admitting: Neurology

## 2017-11-25 ENCOUNTER — Telehealth: Payer: Self-pay | Admitting: Neurology

## 2017-11-25 NOTE — Telephone Encounter (Signed)
Pt's wife [name] lvm requesting to speak with you, she wants to update you on her husband's progress.

## 2017-11-26 ENCOUNTER — Telehealth: Payer: Self-pay | Admitting: Neurology

## 2017-11-26 NOTE — Telephone Encounter (Signed)
pls see other phone encounter 

## 2017-11-26 NOTE — Telephone Encounter (Signed)
Spoke with pt wife who states that pt is doing great.  Reduced Onfi from 30mg  to 20mg  about 10 days ago.  Pt and wife seem to want to further reduce/eliminate Onfi.  Advised I would send Dr. Delice Lesch message and return call with any recommendations. Pt and wife states that easiest form of communication would be MyChart.

## 2017-11-26 NOTE — Telephone Encounter (Signed)
Wife returning your call. Thanks

## 2017-11-27 NOTE — Telephone Encounter (Signed)
See MyChart response.

## 2017-12-10 ENCOUNTER — Telehealth: Payer: Self-pay | Admitting: Neurology

## 2017-12-10 NOTE — Telephone Encounter (Signed)
Patient wife would like to talk to someone today he has had 2 seizures in 5 days. his mood is darking also. Please call her on the cell phone number now at [phone]. She really wants to talk to someone today

## 2017-12-10 NOTE — Telephone Encounter (Signed)
Spoke with pt's wife.  She states that pt reduced Onfi to 10mg  QHS on Wednesday 12/02/17.  Wife states that almost immediately she noticed that pt's mood was a little darker and pt is more irritable. States that pt went 9 weeks with no seizure activity and then on Friday, 12/04/17 pt had a seizure and again on Wednesday 12/09/17.  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  Pt and wife are not interested in increasing [place] as they have been trying to wean pt off but they are unsure what to do at this point.  Does not want to go into the weekend without guidance.  Wife states that pt still has Clonazepam on hand from previous Rx.  Sig is to take if pt experiences 2 or more seizure in 24 hour period.  I advised that if pt were to have convulsive seizure, that would be the time to administer Clonazepam.  Wife states that she and pt are now interested in VNS.  Please advise.

## 2017-12-11 MED ORDER — ZONISAMIDE 100 MG PO CAPS: ORAL_CAPSULE | ORAL | 6 refills | Status: DC

## 2017-12-11 NOTE — Telephone Encounter (Signed)
Pls let her know that it is not unusual that there can be breakthrough seizures any time medication changes are made. We can increase Zonisamide to 400mg  and see how he does. Thanks

## 2017-12-11 NOTE — Telephone Encounter (Signed)
Spoke with pt relaying message below.  He is agreeable to increasing Zonisamide.  States that he has plenty on hand for the increase, however asked that I send new Rx to pharmacy.  Verified preferred pharmacy - [facility] on [address].  Rx sent.

## 2017-12-18 ENCOUNTER — Telehealth: Payer: Self-pay | Admitting: Neurology

## 2017-12-18 NOTE — Telephone Encounter (Signed)
Patient's wife called and left a voicemail  and stated that he had a daytimes seizure. She needs someone to call her. Please call her at [phone]. Thanks.

## 2017-12-21 NOTE — Telephone Encounter (Signed)
Pt scheduled for Wednesday, August 28 @ 1pm

## 2017-12-23 ENCOUNTER — Ambulatory Visit: Payer: Medicare Other | Admitting: Neurology

## 2017-12-23 ENCOUNTER — Other Ambulatory Visit: Payer: Self-pay

## 2017-12-23 ENCOUNTER — Encounter: Payer: Self-pay | Admitting: Neurology

## 2017-12-23 VITALS — BP 108/62 | HR 73 | Ht 68.0 in | Wt 138.0 lb

## 2017-12-23 DIAGNOSIS — G40219 Localization-related (focal) (partial) symptomatic epilepsy and epileptic syndromes with complex partial seizures, intractable, without status epilepticus: Secondary | ICD-10-CM

## 2017-12-23 MED ORDER — CLOBAZAM 10 MG PO TABS: ORAL_TABLET | ORAL | 5 refills | Status: DC

## 2017-12-23 NOTE — Patient Instructions (Signed)
1. Continue Zonisamide 300mg  daily 2. Continue Onfi 20mg  daily for another 2 weeks, then call our office and we will send in a prescription for Onfi 10mg : take 1.5 tablets daliy (15mg ) for another month 3. We will start the process of VNS and send referral to Dr. Kathyrn Sheriff 4. Follow-up in 3 months, call for any changes  Seizure Precautions: 1. If medication has been prescribed for you to prevent seizures, take it exactly as directed.  Do not stop taking the medicine without talking to your doctor first, even if you have not had a seizure in a long time.   2. Avoid activities in which a seizure would cause danger to yourself or to others.  Don't operate dangerous machinery, swim alone, or climb in high or dangerous places, such as on ladders, roofs, or girders.  Do not drive unless your doctor says you may.  3. If you have any warning that you may have a seizure, lay down in a safe place where you can't hurt yourself.    4.  No driving for 6 months from last seizure, as per [facility]   Please refer to the following link on the [place] website for more information: http://[email or web address]/[email or web address]/driving/drivingu.cfm   5.  Maintain good sleep hygiene. Avoid alcohol.  6.  Contact your doctor if you have any problems that may be related to the medicine you are taking.  7.  Call 911 and bring the patient back to the ED if:        A.  The seizure lasts longer than 5 minutes.       B.  The patient doesn't awaken shortly after the seizure  C.  The patient has new problems such as difficulty seeing, speaking or moving  D.  The patient was injured during the seizure  E.  The patient has a temperature over 102 F (39C)  F.  The patient vomited and now is having trouble breathing

## 2017-12-23 NOTE — Progress Notes (Signed)
NEUROLOGY FOLLOW UP OFFICE NOTE  John Holt [id number] 1946-01-26  HISTORY OF PRESENT ILLNESS: I had the pleasure of seeing John Holt in follow-up in the neurology clinic on 12/23/2017.  The patient was last seen 6 weeks ago for intractable epilepsy arising from the righ mesial temporal or mesial frontal region.  He is again accompanied by his wife who helps supplement the history today. On his last visit, he reported feeling bad, more depressed about his memory, and felt Onfi was contributing a lot to his symptoms. He had a seizure on 09/01/17 during wakefulness. He had a repeat MRI brain on 10/14/17 which showed asymmetric right hippocampal volume loss suggestive of mesial temporal sclerosis. He was started on Zonisamide and did very well with no seizures for 9 weeks (baseline nocturnal seizures every 2 weeks or so). He reduced Onfi to 20mg  and his wife reported he was doing great, his sense of humor was back, he was laughing, at one point his wife thought the euphoria was almost too much. She noticed this as a big difference from how he was the past couple of years where he had a "flattening" of mood. Unfortunately, after he reduced the Onfi to 10mg  on 12/02/17, his wife noticed almost immediately that his mood was a little darker and he was more irritable. He had a nocturnal seizure on 8/9, then another on 8/14. With each seizure, his mood was going "way, way down." He was instructed in increase Zonisamide to 400mg  qhs. He then had a seizure during the afternoon of 12/18/17. He is tearful in the office today and has some difficulty finding his words, reporting it was the end of a stressful week, he had been having feelings of anxiety that week. He had no warning and is amnestic of the seizure, his wife reports he clasped his hands together in front of him and was doing repetitive digging movements, unresponsive. He then smiled at her and grabbed her arms, jerking her around like he was dancing with  her. This lasted 2-3 minutes, then he started answering questions. No focal weakness, tongue bite, or incontinence. He called our office and was instructed to increase Onfi back to 20mg  daily and Zonisamide down to 300mg  qhs. He has felt normal this week. He has had tingling in his feet with the Zonisamide, this has quieted down but not completely gone away. He denies any headaches, dizziness, vision changes, focal weakness, no falls.   He recently completed Neuropsychological testing which indicated mild neurocognitive disorder due to seizure disorder. Testing and daily functioning not consistent with dementia. Testing revealed mostly normal cognitive functioning. However, he did demonstrate mildly reduced visual-spatial construction and more impaired visual memory, consistent with right hemisphere involvement and in particular mesial temporal lobe involvement. He also demonstrated impairment in verbal memory of non-contextual information, but memory of contextual information was intact, suggesting more of a problem with frontal-subcortical networks than hippocampal consolidation dysfunction.  HPI 05/13/2017: This is a very pleasant 72 yo RH man with a history of focal epilepsy of right mesial temporal or right mesial frontal onset. He had been seeing epileptologist Dr. Jacelyn Grip for several years, records were reviewed. He reports staring episodes in childhood. In his 50s, he recalls having episodes of a sinking feeling in his stomach with deja vu occurring around once a month initially. When he was started on seizure medications, these episodes occur rarely, around 1-2 a year. His wife started noticing possible symptoms in his 74s, they were in a  meeting and she noticed him swaying back and forth but seemed unaware of it. He started having nocturnal spells more than 15 years ago, where he would have "leg thrusting" (bicycling type leg movements), lip smacking, often followed by arousal with disorientation, need to  urinate and walking around (at times resembling sleepwalking). He has had episodes where he would have a cluster of seizures then become paranoid and manic with significant post-ictal psychosis. He usually has nocturnal seizures every 3 weeks or so. His wife would give him prn clonazepam when he has clusters, and this has helped "zap" them. He has not needed clonazepam in at least 1.5 years. He has rare daytime seizures, his wife recalls the seizures in 2006 and 2013 with post-ictal psychosis, there is note of a spell of undressing and confusion in the [employer] at [place] in 2015. In the past he has been on Lamictal, Depakote, Gabapentin. Gabapentin has been tapered off. He has been taking Onfi for several years and feels this has been the most helpful. He was recently diagnosed with bladder cancer in October 2018 and underwent resection and chemotherapy. They report that towards the end of chemo, he had some incidents with seizures. He has no recollection of the events. His wife reports that after he had his second infusion and the PICC line was taken out, he started having slight feet movements and was staring and unresponsive. He had another episode while shoveling snow, he came in and started talking and his speech became garbled, "words were not right" for 60 seconds. Three days after on the way home from chemo, he had a similar episode with his speech. He was talking about Aristotle then his speech became garbled. The last daytime event was on 04/28/17. He started swaying side to side, speech was garbled, and he was staring off for a few minutes. Due to these daytime episodes, his Onfi dose was temporarily increased to 40mg  qhs. They report he has not had any nocturnal seizures since November, which is unusual for him. The higher dose of Onfi knocks him out. They report he is done with chemotherapy and will be seeing his oncologist to discuss next steps.  He denies any headaches, dizziness, diplopia,  dysarthria/dysphagia, neck/back pain, focal numbness/tingling/weakness, bowel/bladder dysfunction, no falls. He had muscle cramps, taking magnesium supplements has helped. His memory is what bothers him a lot. They mostly notice problems with his "narrative memory." He would not recall watching a movie 2-3 weeks prior. He cannot recall anecdotes. He would make notes on an article, and later find that he had already made notes on the same article previously. He is able to remember abstract concepts from dense philosophy books he reads. His spatial memory is not good. He denies missing medications. Every once in a while he forgets his medications (twice in the past month). He drives only to the grocery and denies getting lost. He has had Neuropsychological testing, report unavailable for review.  Prior AEDs: Depakote, Lamictal, Tegretol, Trileptal, Keppra, Vimpat, Gabapentin  Epilepsy Risk Factors: His father had rare spells 1-2 times a year of "zonking out." He and his brother had "spasms" as babies, none after age 93/3. He recalls having a head injury in his teenage years. Otherwise he had a normal birth and early development, no history of febrile convulsions, CNS infections, or neurosurgical procedures.   Prior workup: MRI brain 01/2014: subtle findings consistent with right mesial temporal sclerosis EEGs: EEG 2009 - right temporal spike activity and right TIRDA EEG  01/2009 - normal EEG 07/2012 - normal  MRI 2006 - normal  PET Brain 2016 - right mesial temporal hypometabolism.  EMU 04/25/2014 - 05/04/2014 6 events captured on camera, 1 off camera - repeated neck extension and flexion, followed by pelvic thrusting and body rocking movements. Appear to localize to the right temporal region, although clinically suggestive of frontal onset. Patient taken off Depakote and Lamictal and switched to high dose gabapentin  PAST MEDICAL HISTORY: Past Medical History:  Diagnosis Date  . Anxiety  disorder   . Bipolar 1 disorder, mixed ([place])    w/ hx psychosis/ dissociative reactions/  suicide ideations and attempt  . BPH (benign prostatic hypertrophy) with urinary obstruction   . Frequency of urination   . History of adenomatous polyp of colon   . History of closed head injury    2000-- fell off ladder--  temporary memory loss resolved  . History of panic attacks   . History of psychosis    x2  last one documented 2006  w/ hallucination  and suicide ideation  . Nocturia   . Seizures ([place])   . Sigmoid diverticulosis   . Temporal lobe epilepsy syndrome [facility]) neurologist-  dr Leta Baptist--  Linden Dolin one every 2 weeks "legs jerking around, per wife , pt unaware he's doing this"   localization-related right temporal lobe --  mostly nocturnal w/ bilateral lower extremitity movement, staring and moaning then dissorietation afterwards (last daytime seizure June 2013)  . Urge urinary incontinence   . Vesicoureteral reflux, bilateral   . Wears glasses     MEDICATIONS: Current Outpatient Medications on File Prior to Visit  Medication Sig Dispense Refill  . cloBAZam (ONFI) 10 MG tablet Take 3 tablets at night 270 tablet 3  . clonazePAM (KLONOPIN) 0.5 MG tablet Take 1 tablet PO PRN 2 seizures in one day or signs of postictal psychosis.  May repeat once in 24 hours.    . Magnesium 500 MG TABS Take 30 mg by mouth 2 (two) times daily.    . Multiple Vitamin (MULTIVITAMIN) tablet Take 1 tablet by mouth daily.    . tadalafil (CIALIS) 5 MG tablet Take 20 mg by mouth daily as needed. Reported on 09/17/2015    . zonisamide (ZONEGRAN) 100 MG capsule Take 4 capsules each night 120 capsule 6   No current facility-administered medications on file prior to visit.     ALLERGIES: No Known Allergies  FAMILY HISTORY: Family History  Problem Relation Age of Onset  . Seizures Father   . Heart disease Father   . Cancer Father        prostat  . Cancer Mother        stomach  . Stomach cancer Mother     . Colon cancer Neg Hx   . Colon polyps Neg Hx   . Esophageal cancer Neg Hx   . Rectal cancer Neg Hx     SOCIAL HISTORY: Social History   Socioeconomic History  . Marital status: Married    Spouse name: [name]  . Number of children: 2  . Years of education: PhD  . 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 Financial resource strain: Not on file  . Food insecurity:    Worry: Not on file    Inability: Not on file  . Transportation needs:    Medical: Not on file    Non-medical: Not  on file  Tobacco Use  . Smoking status: Former Smoker    Packs/day: 1.00    Years: 15.00    Pack years: 15.00    Types: Cigarettes    Last attempt to quit: 04/03/1972    Years since quitting: 45.7  . Smokeless tobacco: Never Used  Substance and Sexual Activity  . Alcohol use: Yes    Alcohol/week: 14.0 standard drinks    Types: 14 Glasses of wine per week    Comment: 2 wine daily  . Drug use: No  . Sexual activity: Not on file  Lifestyle  . Physical activity:    Days per week: Not on file    Minutes per session: Not on file  . Stress: Not on file  Relationships  . Social connections:    Talks on phone: Not on file    Gets together: Not on file    Attends religious service: Not on file    Active member of club or organization: Not on file    Attends meetings of clubs or organizations: Not on file    Relationship status: Not on file  . Intimate partner violence:    Fear of current or ex partner: Not on file    Emotionally abused: Not on file    Physically abused: Not on file    Forced sexual activity: Not on file  Other Topics Concern  . Not on file  Social History Narrative      Pt lives at home with his spouse.   Caffeine Use- 2 cups daily    REVIEW OF SYSTEMS: Constitutional: No fevers, chills, or sweats, no generalized fatigue, change in appetite Eyes: No visual changes,  double vision, eye pain Ear, nose and throat: No hearing loss, ear pain, nasal congestion, sore throat Cardiovascular: No chest pain, palpitations Respiratory:  No shortness of breath at rest or with exertion, wheezes GastrointestinaI: No nausea, vomiting, diarrhea, abdominal pain, fecal incontinence Genitourinary:  No dysuria, urinary retention or frequency Musculoskeletal:  No neck pain, back pain Integumentary: No rash, pruritus, skin lesions Neurological: as above Psychiatric: No depression, insomnia, anxiety Endocrine: No palpitations, fatigue, diaphoresis, mood swings, change in appetite, change in weight, increased thirst Hematologic/Lymphatic:  No anemia, purpura, petechiae. Allergic/Immunologic: no itchy/runny eyes, nasal congestion, recent allergic reactions, rashes  PHYSICAL EXAM: Vitals:   12/23/17 1308  BP: 108/62  Pulse: 73  SpO2: 98%   General: No acute distress, tearful Head:  Normocephalic/atraumatic Skin/Extremities: No rash, no edema Neurological Exam: alert and oriented to person, place, and time. No aphasia or dysarthria. Fund of knowledge is appropriate.  Recent and remote memory are intact.  Attention and concentration are normal.    Able to name objects and repeat phrases. Cranial nerves: Pupils equal, round. No facial asymmetry. Motor: Bulk and tone normal, moves all extremities symmetrically. Gait narrow-based and steady.  IMPRESSION: This is a very pleasant 72 yo RH man with a history of focal epilepsy of right mesial temporal or right mesial frontal onset. Repeat MRI brain showed right hippocampal asymmetry suggestive of mesial temporal sclerosis. PET scan showed right mesial temporal hypometabolism. EEGs in the past showed right temporal spikes and TIRDA, EMU admission captured seizures that appeared to localize to the right temporal area although clinically suggestive of frontal onset. He was worried about cognitive changes and continued seizures on Onfi, and  agreed to start Zonisamide. He did initially well with a longer seizure-free period with initiation of Zonisamide, however with taper of Onfi, seizures recurred as  well as worsening mood. We discussed doing a slower wean of Onfi, continue Onfi 20mg  daily for another 2 weeks, then reduce to 15mg  daily. We will slowly taper off every month instead of every 2 weeks. Continue Zonisamide 300mg  qhs. We had an extensive discussion about device options and surgery for epilepsy, he and his wife are now interested in proceeding with VNS. We discussed expectations from VNS, he will be referred to Dr. Kathyrn Sheriff for plans for VNS placement. He is aware of [place] driving laws to stop driving after a seizure until 6 months seizure-free. He will follow-up after VNS placement to turn on device. They know to call for any changes.   Thank you for allowing me to participate in his care.  Please do not hesitate to call for any questions or concerns.  The duration of this appointment visit was 30 minutes of face-to-face time with the patient.  Greater than 50% of this time was spent in counseling, explanation of diagnosis, planning of further management, and coordination of care.   Ellouise Newer, M.D.   CC: Dr. Dagmar Hait

## 2018-01-04 ENCOUNTER — Telehealth: Payer: Self-pay | Admitting: Neurology

## 2018-01-04 NOTE — Telephone Encounter (Signed)
Patient wife John Holt called and wants to talk to someone to see if the surgery date was sch or check the status of it

## 2018-01-05 ENCOUNTER — Ambulatory Visit: Payer: Medicare Other | Admitting: Neurology

## 2018-01-05 NOTE — Telephone Encounter (Signed)
pls see MyChart message exchange

## 2018-01-19 ENCOUNTER — Other Ambulatory Visit: Payer: Self-pay | Admitting: Neurosurgery

## 2018-01-25 NOTE — Pre-Procedure Instructions (Signed)
John Holt  01/25/2018      [id number] - [name], [place] [address] Phone: [phone] Fax: [place], [place] [address] [place] [address] Phone: [phone] Fax: [phone]    Your procedure is scheduled on 01/28/18.  Report to [facility] Admitting at 10:30 A.M.  Call this number if you have problems the morning of surgery:  [phone]   Remember:  Do not eat or drink after midnight.      Take these medicines the morning of surgery with A SIP OF WATER ----klonopin,clobazam    Do not wear jewelry, make-up or nail polish.  Do not wear lotions, powders, or perfumes, or deodorant.  Do not shave 48 hours prior to surgery.  Men may shave face and neck.  Do not bring valuables to the hospital.  [facility] is not responsible for any belongings or valuables.  Contacts, dentures or bridgework may not be worn into surgery.  Leave your suitcase in the car.  After surgery it may be brought to your room.  For patients admitted to the hospital, discharge time will be determined by your treatment team.  Patients discharged the day of surgery will not be allowed to drive home.   Name and phone number of your driver:   Do not take any aspirin,anti-inflammatories,vitamins,or herbal supplements 5-7 days prior to surgery. Special instructions:  [place] - Preparing for Surgery  Before surgery, you can play an important role.  Because skin is not sterile, your skin needs to be as free of germs as possible.  You can reduce the number of germs on you skin by washing with CHG (chlorahexidine gluconate) soap before surgery.  CHG is an antiseptic cleaner which kills germs and bonds with the skin to continue killing germs even after washing.  Oral Hygiene is also important in reducing the risk of infection.  Remember to brush your teeth  with your regular toothpaste the morning of surgery.  Please DO NOT use if you have an allergy to CHG or antibacterial soaps.  If your skin becomes reddened/irritated stop using the CHG and inform your nurse when you arrive at Short Stay.  Do not shave (including legs and underarms) for at least 48 hours prior to the first CHG shower.  You may shave your face.  Please follow these instructions carefully:   1.  Shower with CHG Soap the night before surgery and the morning of Surgery.  2.  If you choose to wash your hair, wash your hair first as usual with your normal shampoo.  3.  After you shampoo, rinse your hair and body thoroughly to remove the shampoo. 4.  Use CHG as you would any other liquid soap.  You can apply chg directly to the skin and wash gently with a      scrungie or washcloth.           5.  Apply the CHG Soap to your body ONLY FROM THE NECK DOWN.   Do not use on open wounds or open sores. Avoid contact with your eyes, ears, mouth and genitals (private parts).  Wash genitals (private parts) with your normal soap.  6.  Wash thoroughly, paying special attention to the area where your surgery will be performed.  7.  Thoroughly rinse your body with warm water from the neck down.  8.  DO  NOT shower/wash with your normal soap after using and rinsing off the CHG Soap.  9.  Pat yourself dry with a clean towel.            10.  Wear clean pajamas.            11.  Place clean sheets on your bed the night of your first shower and do not sleep with pets.  Day of Surgery  Do not apply any lotions/deoderants the morning of surgery.   Please wear clean clothes to the hospital/surgery center. Remember to brush your teeth with toothpaste.    Please read over the following fact sheets that you were given.

## 2018-01-26 ENCOUNTER — Other Ambulatory Visit: Payer: Self-pay

## 2018-01-26 ENCOUNTER — Encounter (HOSPITAL_COMMUNITY): Payer: Self-pay

## 2018-01-26 ENCOUNTER — Other Ambulatory Visit: Payer: Self-pay | Admitting: Neurosurgery

## 2018-01-26 ENCOUNTER — Encounter (HOSPITAL_COMMUNITY)
Admission: RE | Admit: 2018-01-26 | Discharge: 2018-01-26 | Disposition: A | Discharge status: Home / Self Care | Payer: Medicare Other | Source: Ambulatory Visit | Attending: Neurosurgery | Admitting: Neurosurgery

## 2018-01-26 DIAGNOSIS — G40804 Other epilepsy, intractable, without status epilepticus: Secondary | ICD-10-CM | POA: Diagnosis present

## 2018-01-26 DIAGNOSIS — F419 Anxiety disorder, unspecified: Secondary | ICD-10-CM | POA: Diagnosis not present

## 2018-01-26 DIAGNOSIS — F319 Bipolar disorder, unspecified: Secondary | ICD-10-CM | POA: Diagnosis not present

## 2018-01-26 DIAGNOSIS — Z87891 Personal history of nicotine dependence: Secondary | ICD-10-CM | POA: Diagnosis not present

## 2018-01-26 DIAGNOSIS — Z01812 Encounter for preprocedural laboratory examination: Secondary | ICD-10-CM | POA: Insufficient documentation

## 2018-01-26 DIAGNOSIS — Z888 Allergy status to other drugs, medicaments and biological substances status: Secondary | ICD-10-CM | POA: Diagnosis not present

## 2018-01-26 HISTORY — DX: Malignant (primary) neoplasm, unspecified: C80.1

## 2018-01-26 LAB — CBC
HCT: 42.8 % (ref 39.0–52.0)
Hemoglobin: 13.8 g/dL (ref 13.0–17.0)
MCH: 30.6 pg (ref 26.0–34.0)
MCHC: 32.2 g/dL (ref 30.0–36.0)
MCV: 94.9 fL (ref 78.0–100.0)
PLATELETS: 175 10*3/uL (ref 150–400)
RBC: 4.51 MIL/uL (ref 4.22–5.81)
RDW: 13.8 % (ref 11.5–15.5)
WBC: 4.2 10*3/uL (ref 4.0–10.5)

## 2018-01-26 NOTE — Progress Notes (Signed)
PCP: Dr. Dagmar Hait Cardiologist: denies DM: denies  Pt denies SOB, cough, fever, chest pain.  Pt states understanding of instructions for day of surgery.

## 2018-01-28 ENCOUNTER — Ambulatory Visit (HOSPITAL_COMMUNITY): Payer: Medicare Other | Admitting: Anesthesiology

## 2018-01-28 ENCOUNTER — Ambulatory Visit (HOSPITAL_COMMUNITY)
Admission: RE | Admit: 2018-01-28 | Discharge: 2018-01-28 | Disposition: A | Discharge status: Home / Self Care | Payer: Medicare Other | Source: Ambulatory Visit | Attending: Neurosurgery | Admitting: Neurosurgery

## 2018-01-28 ENCOUNTER — Encounter (HOSPITAL_COMMUNITY)
Admission: RE | Disposition: A | Discharge status: Home / Self Care | Payer: Self-pay | Source: Ambulatory Visit | Attending: Neurosurgery

## 2018-01-28 ENCOUNTER — Encounter (HOSPITAL_COMMUNITY): Payer: Self-pay | Admitting: Urology

## 2018-01-28 DIAGNOSIS — G40804 Other epilepsy, intractable, without status epilepticus: Secondary | ICD-10-CM | POA: Diagnosis not present

## 2018-01-28 DIAGNOSIS — Z9689 Presence of other specified functional implants: Secondary | ICD-10-CM

## 2018-01-28 DIAGNOSIS — F419 Anxiety disorder, unspecified: Secondary | ICD-10-CM | POA: Diagnosis not present

## 2018-01-28 DIAGNOSIS — Z87891 Personal history of nicotine dependence: Secondary | ICD-10-CM | POA: Insufficient documentation

## 2018-01-28 DIAGNOSIS — Z888 Allergy status to other drugs, medicaments and biological substances status: Secondary | ICD-10-CM | POA: Diagnosis not present

## 2018-01-28 DIAGNOSIS — F319 Bipolar disorder, unspecified: Secondary | ICD-10-CM | POA: Diagnosis not present

## 2018-01-28 HISTORY — PX: VAGUS NERVE STIMULATOR INSERTION: SHX348

## 2018-01-28 HISTORY — DX: Presence of other specified functional implants: Z96.89

## 2018-01-28 SURGERY — VAGAL NERVE STIMULATOR IMPLANT
Anesthesia: General | Laterality: Left

## 2018-01-28 MED ORDER — PROPOFOL 10 MG/ML IV BOLUS
INTRAVENOUS | Status: AC
  Filled 2018-01-28: qty 20

## 2018-01-28 MED ORDER — CEFAZOLIN SODIUM-DEXTROSE 2-4 GM/100ML-% IV SOLN: 2.0000 g | INTRAVENOUS | Status: DC

## 2018-01-28 MED ORDER — LIDOCAINE 2% (20 MG/ML) 5 ML SYRINGE
INTRAMUSCULAR | Status: DC | PRN
  Administered 2018-01-28: 45 mg via INTRAVENOUS

## 2018-01-28 MED ORDER — ONDANSETRON HCL 4 MG/2ML IJ SOLN
INTRAMUSCULAR | Status: DC | PRN
  Administered 2018-01-28: 4 mg via INTRAVENOUS

## 2018-01-28 MED ORDER — BUPIVACAINE HCL 0.5 % IJ SOLN
INTRAMUSCULAR | Status: DC | PRN
  Administered 2018-01-28: 4 mL
  Administered 2018-01-28: 9 mL

## 2018-01-28 MED ORDER — CHLORHEXIDINE GLUCONATE CLOTH 2 % EX PADS: 6.0000 | MEDICATED_PAD | Freq: Once | CUTANEOUS | Status: DC

## 2018-01-28 MED ORDER — LIDOCAINE 2% (20 MG/ML) 5 ML SYRINGE
INTRAMUSCULAR | Status: AC
  Filled 2018-01-28: qty 5

## 2018-01-28 MED ORDER — SUGAMMADEX SODIUM 200 MG/2ML IV SOLN
INTRAVENOUS | Status: DC | PRN
  Administered 2018-01-28: 200 mg via INTRAVENOUS

## 2018-01-28 MED ORDER — ROCURONIUM BROMIDE 50 MG/5ML IV SOSY
PREFILLED_SYRINGE | INTRAVENOUS | Status: AC
  Filled 2018-01-28: qty 5

## 2018-01-28 MED ORDER — PROPOFOL 10 MG/ML IV BOLUS
INTRAVENOUS | Status: DC | PRN
  Administered 2018-01-28: 80 mg via INTRAVENOUS

## 2018-01-28 MED ORDER — 0.9 % SODIUM CHLORIDE (POUR BTL) OPTIME
TOPICAL | Status: DC | PRN
  Administered 2018-01-28: 1000 mL

## 2018-01-28 MED ORDER — LIDOCAINE-EPINEPHRINE 1 %-1:100000 IJ SOLN
INTRAMUSCULAR | Status: AC
  Filled 2018-01-28: qty 1

## 2018-01-28 MED ORDER — HYDROCODONE-ACETAMINOPHEN 5-325 MG PO TABS: 1.0000 | ORAL_TABLET | Freq: Four times a day (QID) | ORAL | 0 refills | Status: DC | PRN

## 2018-01-28 MED ORDER — LIDOCAINE-EPINEPHRINE 1 %-1:100000 IJ SOLN
INTRAMUSCULAR | Status: DC | PRN
  Administered 2018-01-28: 9 mL
  Administered 2018-01-28: 4 mL via INTRADERMAL

## 2018-01-28 MED ORDER — THROMBIN 5000 UNITS EX SOLR
OROMUCOSAL | Status: DC | PRN
  Administered 2018-01-28: 12:00:00 via TOPICAL

## 2018-01-28 MED ORDER — ROCURONIUM BROMIDE 50 MG/5ML IV SOSY
PREFILLED_SYRINGE | INTRAVENOUS | Status: DC | PRN
  Administered 2018-01-28: 50 mg via INTRAVENOUS

## 2018-01-28 MED ORDER — FENTANYL CITRATE (PF) 250 MCG/5ML IJ SOLN
INTRAMUSCULAR | Status: AC
  Filled 2018-01-28: qty 5

## 2018-01-28 MED ORDER — ONDANSETRON HCL 4 MG/2ML IJ SOLN
INTRAMUSCULAR | Status: AC
  Filled 2018-01-28: qty 2

## 2018-01-28 MED ORDER — LACTATED RINGERS IV SOLN
INTRAVENOUS | Status: DC
  Administered 2018-01-28: 11:00:00 via INTRAVENOUS

## 2018-01-28 MED ORDER — DEXAMETHASONE SODIUM PHOSPHATE 10 MG/ML IJ SOLN
INTRAMUSCULAR | Status: DC | PRN
  Administered 2018-01-28: 10 mg via INTRAVENOUS

## 2018-01-28 MED ORDER — SODIUM CHLORIDE 0.9 % IV SOLN
INTRAVENOUS | Status: DC | PRN
  Administered 2018-01-28: 10 ug/min via INTRAVENOUS

## 2018-01-28 MED ORDER — FENTANYL CITRATE (PF) 100 MCG/2ML IJ SOLN
INTRAMUSCULAR | Status: DC | PRN
  Administered 2018-01-28: 25 ug via INTRAVENOUS

## 2018-01-28 MED ORDER — BUPIVACAINE HCL (PF) 0.5 % IJ SOLN
INTRAMUSCULAR | Status: AC
  Filled 2018-01-28: qty 30

## 2018-01-28 MED ORDER — DEXAMETHASONE SODIUM PHOSPHATE 10 MG/ML IJ SOLN
INTRAMUSCULAR | Status: AC
  Filled 2018-01-28: qty 1

## 2018-01-28 MED ORDER — THROMBIN 5000 UNITS EX SOLR
CUTANEOUS | Status: AC
  Filled 2018-01-28: qty 5000

## 2018-01-28 MED ORDER — SODIUM CHLORIDE 0.9 % IV SOLN
INTRAVENOUS | Status: DC | PRN
  Administered 2018-01-28: 12:00:00

## 2018-01-28 SURGICAL SUPPLY — 59 items
BAG DECANTER FOR FLEXI CONT (MISCELLANEOUS) ×3 IMPLANT
BENZOIN TINCTURE PRP APPL 2/3 (GAUZE/BANDAGES/DRESSINGS) IMPLANT
BLADE CLIPPER SURG (BLADE) IMPLANT
BLADE SURG 11 STRL SS (BLADE) ×3 IMPLANT
CANISTER SUCT 3000ML PPV (MISCELLANEOUS) ×3 IMPLANT
CARTRIDGE OIL MAESTRO DRILL (MISCELLANEOUS) IMPLANT
COVER WAND RF STERILE (DRAPES) IMPLANT
DECANTER SPIKE VIAL GLASS SM (MISCELLANEOUS) ×3 IMPLANT
DERMABOND ADVANCED (GAUZE/BANDAGES/DRESSINGS) ×4
DERMABOND ADVANCED .7 DNX12 (GAUZE/BANDAGES/DRESSINGS) ×2 IMPLANT
DIFFUSER DRILL AIR PNEUMATIC (MISCELLANEOUS) IMPLANT
DRAPE CAMERA VIDEO/LASER (DRAPES) ×3 IMPLANT
DRAPE HALF SHEET 40X57 (DRAPES) ×3 IMPLANT
DRAPE INCISE IOBAN 66X45 STRL (DRAPES) ×3 IMPLANT
DRAPE LAPAROTOMY T 98X78 PEDS (DRAPES) ×3 IMPLANT
DRSG OPSITE POSTOP 3X4 (GAUZE/BANDAGES/DRESSINGS) IMPLANT
DRSG OPSITE POSTOP 4X6 (GAUZE/BANDAGES/DRESSINGS) ×3 IMPLANT
DRSG OPSITE POSTOP 4X8 (GAUZE/BANDAGES/DRESSINGS) ×3 IMPLANT
DRSG TEGADERM 4X4.75 (GAUZE/BANDAGES/DRESSINGS) IMPLANT
DURAPREP 6ML APPLICATOR 50/CS (WOUND CARE) ×3 IMPLANT
ELECT COATED BLADE 2.86 ST (ELECTRODE) ×3 IMPLANT
ELECT REM PT RETURN 9FT ADLT (ELECTROSURGICAL) ×3
ELECTRODE REM PT RTRN 9FT ADLT (ELECTROSURGICAL) ×1 IMPLANT
GAUZE 4X4 16PLY RFD (DISPOSABLE) IMPLANT
GENERATOR MODEL 106 ASPIRE (Neuro Prosthesis/Implant) ×3 IMPLANT
GLOVE BIO SURGEON STRL SZ7.5 (GLOVE) ×6 IMPLANT
GLOVE BIOGEL PI IND STRL 7.5 (GLOVE) ×4 IMPLANT
GLOVE BIOGEL PI INDICATOR 7.5 (GLOVE) ×8
GLOVE ECLIPSE 7.0 STRL STRAW (GLOVE) ×3 IMPLANT
GOWN STRL REUS W/ TWL LRG LVL3 (GOWN DISPOSABLE) ×3 IMPLANT
GOWN STRL REUS W/ TWL XL LVL3 (GOWN DISPOSABLE) ×1 IMPLANT
GOWN STRL REUS W/TWL 2XL LVL3 (GOWN DISPOSABLE) IMPLANT
GOWN STRL REUS W/TWL LRG LVL3 (GOWN DISPOSABLE) ×6
GOWN STRL REUS W/TWL XL LVL3 (GOWN DISPOSABLE) ×2
HEMOSTAT POWDER KIT SURGIFOAM (HEMOSTASIS) ×3 IMPLANT
KIT BASIN OR (CUSTOM PROCEDURE TRAY) ×3 IMPLANT
KIT TURNOVER KIT B (KITS) ×3 IMPLANT
LEAD PERENNIAFLEX 2-3 304 (Neuro Prosthesis/Implant) ×3 IMPLANT
LOOP VESSEL MAXI BLUE (MISCELLANEOUS) IMPLANT
LOOP VESSEL MINI RED (MISCELLANEOUS) IMPLANT
NEEDLE HYPO 22GX1.5 SAFETY (NEEDLE) ×3 IMPLANT
NS IRRIG 1000ML POUR BTL (IV SOLUTION) ×3 IMPLANT
OIL CARTRIDGE MAESTRO DRILL (MISCELLANEOUS)
PACK LAMINECTOMY NEURO (CUSTOM PROCEDURE TRAY) ×3 IMPLANT
PAD ARMBOARD 7.5X6 YLW CONV (MISCELLANEOUS) ×9 IMPLANT
SPONGE INTESTINAL PEANUT (DISPOSABLE) ×3 IMPLANT
SPONGE SURGIFOAM ABS GEL SZ50 (HEMOSTASIS) IMPLANT
SUT ETHILON 3 0 FSL (SUTURE) IMPLANT
SUT NURALON 4 0 TR CR/8 (SUTURE) ×3 IMPLANT
SUT VIC AB 0 CT1 18XCR BRD8 (SUTURE) ×1 IMPLANT
SUT VIC AB 0 CT1 27 (SUTURE) ×2
SUT VIC AB 0 CT1 27XBRD ANBCTR (SUTURE) ×1 IMPLANT
SUT VIC AB 0 CT1 8-18 (SUTURE) ×2
SUT VIC AB 3-0 SH 8-18 (SUTURE) ×6 IMPLANT
SUT VICRYL 3-0 RB1 18 ABS (SUTURE) ×9 IMPLANT
TOWEL GREEN STERILE (TOWEL DISPOSABLE) ×3 IMPLANT
TOWEL GREEN STERILE FF (TOWEL DISPOSABLE) ×3 IMPLANT
TUNNELING TOOL (MISCELLANEOUS) ×3 IMPLANT
WATER STERILE IRR 1000ML POUR (IV SOLUTION) ×3 IMPLANT

## 2018-01-28 NOTE — Anesthesia Procedure Notes (Signed)
Procedure Name: Intubation Date/Time: 01/28/2018 1:17 PM Performed by: Kyung Rudd, CRNA Pre-anesthesia Checklist: Patient identified, Emergency Drugs available, Suction available, Patient being monitored and Timeout performed Patient Re-evaluated:Patient Re-evaluated prior to induction Oxygen Delivery Method: Circle system utilized Preoxygenation: Pre-oxygenation with 100% oxygen Induction Type: IV induction Ventilation: Mask ventilation without difficulty and Oral airway inserted - appropriate to patient size Laryngoscope Size: Mac and 4 Grade View: Grade I Tube type: Oral Tube size: 7.0 mm Number of attempts: 1 Airway Equipment and Method: Stylet Placement Confirmation: ETT inserted through vocal cords under direct vision,  positive ETCO2 and breath sounds checked- equal and bilateral Secured at: 21 cm Tube secured with: Tape Dental Injury: Teeth and Oropharynx as per pre-operative assessment

## 2018-01-28 NOTE — Anesthesia Postprocedure Evaluation (Signed)
Anesthesia Post Note  Patient: John Holt  Procedure(s) Performed: VAGAL NERVE STIMULATOR PLACEMENT (Left )     Patient location during evaluation: PACU Anesthesia Type: General Level of consciousness: awake and alert Pain management: pain level controlled Vital Signs Assessment: post-procedure vital signs reviewed and stable Respiratory status: spontaneous breathing, nonlabored ventilation, respiratory function stable and patient connected to nasal cannula oxygen Cardiovascular status: blood pressure returned to baseline and stable Postop Assessment: no apparent nausea or vomiting Anesthetic complications: no    Last Vitals:  Vitals:   01/28/18 1600 01/28/18 1615  BP: 114/66 111/71  Pulse: 64 65  Resp: 11   Temp:    SpO2: 98% 100%    Last Pain:  Vitals:   01/28/18 1615  TempSrc:   PainSc: 0-No pain                 Effie Berkshire

## 2018-01-28 NOTE — Anesthesia Preprocedure Evaluation (Addendum)
Anesthesia Evaluation  Patient identified by MRN, date of birth, ID band Patient awake    Reviewed: Allergy & Precautions, NPO status , Patient's Chart, lab work & pertinent test results  Airway Mallampati: I  TM Distance: >3 FB Neck ROM: Full    Dental  (+) Caps, Teeth Intact   Pulmonary former smoker,    breath sounds clear to auscultation       Cardiovascular negative cardio ROS   Rhythm:Regular Rate:Normal     Neuro/Psych Seizures -,  Anxiety Depression Bipolar Disorder    GI/Hepatic negative GI ROS, Neg liver ROS,   Endo/Other  negative endocrine ROS  Renal/GU negative Renal ROS  negative genitourinary   Musculoskeletal negative musculoskeletal ROS (+)   Abdominal Normal abdominal exam  (+)   Peds  Hematology negative hematology ROS (+)   Anesthesia Other Findings   Reproductive/Obstetrics                            Anesthesia Physical Anesthesia Plan  ASA: II  Anesthesia Plan: General   Post-op Pain Management:    Induction: Intravenous  PONV Risk Score and Plan: 3 and Ondansetron, Dexamethasone and Treatment may vary due to age or medical condition  Airway Management Planned: Oral ETT  Additional Equipment: None  Intra-op Plan:   Post-operative Plan: Extubation in OR  Informed Consent: I have reviewed the patients History and Physical, chart, labs and discussed the procedure including the risks, benefits and alternatives for the proposed anesthesia with the patient or authorized representative who has indicated his/her understanding and acceptance.   Dental advisory given  Plan Discussed with: CRNA  Anesthesia Plan Comments:         Anesthesia Quick Evaluation

## 2018-01-28 NOTE — Discharge Summary (Signed)
Physician Discharge Summary  Patient ID: John Holt MRN: [id number] DOB/AGE: 74-Sep-1947 74 y.o.  Admit date: 01/28/2018 Discharge date: 01/28/2018  Admission Diagnoses:  Medically intractable epilepsy  Discharge Diagnoses:  Same Active Problems:   * No active hospital problems. *   Discharged Condition: Stable  Hospital Course:  John Holt is a 72 y.o. male who underwent uncomplicated placement of a left VNS. He was at baseline postop and discharged home from PACU in stable condition.  Treatments: Surgery - Placement of left VNS  Discharge Exam: Blood pressure 103/64, pulse (!) 56, temperature 98.2 F (36.8 C), temperature source Oral, resp. rate 20, height 5\' 8"  (1.727 m), weight 62.6 kg, SpO2 100 %. Awake, alert, oriented Speech fluent, appropriate CN grossly intact 5/5 BUE/BLE Wound c/d/i  Disposition: Discharge disposition: 01-Home or Self Care       Discharge Instructions    Call MD for:  redness, tenderness, or signs of infection (pain, swelling, redness, odor or green/yellow discharge around incision site)   Complete by:  As directed    Call MD for:  temperature >100.4   Complete by:  As directed    Diet - low sodium heart healthy   Complete by:  As directed    Discharge instructions   Complete by:  As directed    Walk at home as much as possible, at least 4 times / day   Increase activity slowly   Complete by:  As directed    Lifting restrictions   Complete by:  As directed    No lifting > 10 lbs   May shower / Bathe   Complete by:  As directed    48 hours after surgery   May walk up steps   Complete by:  As directed    No dressing needed   Complete by:  As directed    Other Restrictions   Complete by:  As directed    No bending/twisting at waist     Allergies as of 01/28/2018      Reactions   Chlorhexidine Rash   Full body      Medication List    TAKE these medications   b complex vitamins tablet Take 1 tablet by mouth  daily.   cloBAZam 10 MG tablet Commonly known as:  ONFI Take 1.5 tablets daily What changed:    how much to take  how to take this  when to take this  additional instructions   clonazePAM 0.5 MG tablet Commonly known as:  KLONOPIN Take 0.5 mg by mouth daily as needed (epilepsy).   HYDROcodone-acetaminophen 5-325 MG tablet Commonly known as:  NORCO/VICODIN Take 1 tablet by mouth every 6 (six) hours as needed for moderate pain.   Magnesium 500 MG Tabs Take 500 mg by mouth daily.   multivitamin-lutein Caps capsule Take 1 capsule by mouth daily.   tadalafil 5 MG tablet Commonly known as:  CIALIS Take 20 mg by mouth daily as needed for erectile dysfunction.   zonisamide 100 MG capsule Commonly known as:  ZONEGRAN Take 4 capsules each night What changed:    how much to take  how to take this  when to take this  additional instructions      Follow-up Information    Consuella Lose, MD In 2 weeks.   Specialty:  Neurosurgery Contact information: [address] [place] [address] [phone]           Signed: Jairo Ben 01/28/2018, 2:58 PM

## 2018-01-28 NOTE — Op Note (Signed)
  NEUROSURGERY OEPRATIVE NOTE    PREOP DIAGNOSIS: Medically intractable temporal lobe epilepsy   POSTOP DIAGNOSIS: Same  PROCEDURE: 1. Placement of left Vagal nerve stimulator  SURGEON: Dr. Consuella Lose, MD  ASSISTANT: Dr. Emelda Brothers, MD  ANESTHESIA: General Endotracheal  EBL: 50cc  SPECIMENS: None  DRAINS: None  COMPLICATIONS: None immediate  CONDITION: Hemodynamically stable to PACU  HISTORY: John Holt is a 72 y.o. male who was initially seen in the outpatient clinic as a referral from neurology for medically intractable epilepsy.  While his seizure work-up has demonstrated temporal lobe epilepsy, after careful consideration he has elected not to pursue definitive surgical treatment.  He therefore wished to proceed with vagal nerve stimulator placement.  The patient appeared to be a good candidate for the procedure. The risks and benefits of the surgery were reviewed in detail. After all questions were answered, informed consent was obtained.  PROCEDURE IN DETAIL: After informed consent was obtained and witnessed, the patient was brought to the operating room. After induction of general anesthesia, the patient was positioned on the operative table in the supine position. All pressure points were meticulously padded. Skin incisions were then marked out and prepped and draped in the usual sterile fashion.  After timeout was conducted, skin incision was infiltrated with local anesthetic with epinephrine. Skin incision was then made sharply, and Bovie electrocautery was used to dissect the subcutaneous tissue. The platysma muscle was identified and incised. The platysma was then undermined. The medial border of the sternocleidomastoid muscle was identified, and dissection was carried out utilizing natural tissue planes of the neck until the carotid sheath was identified. The jugular vein was initially identified and the carotid sheath was incised. The vagus nerve was  then identified running between the carotid and jugular. The vagus nerve was circumferentially dissected for length of a proximally 2-1/2 cm. The infraclavicular incision was then made, and Bovie electrocautery was used to dissect the subcutaneous tissue. A subcutaneous pocket was then made. A subcutaneous tunneler was then passed from the neck to the infraclavicular incision. The vagal stimulator leads were then tunneled. The leads were then placed on the vagus nerve. A relaxing curl was then placed, and the leaves were tacked to the sternocleidomastoid muscle. The generator was then connected to the leads, and placed in the subcutaneous pocker. Testing was done to confirm normal impedance, and good placement and heart rate detection.   Wounds were then irrigated with copious amounts of normal saline irrigation. The platysma was closed using interrupted 3-0 Vicryl stitches. Subcutaneous layer and the subclavicular incision was closed using interrupted 3-0 Vicryl stitches. Skin was then closed using interrupted 3-0 Vicryl, and a layer of Dermabond was applied.   At the end of the case all sponge, needle, cottonoid, and instrument counts were correct. The patient was then extubated, transferred to the stretcher, and taken to the postanesthesia care unit in stable hemodynamic condition.

## 2018-01-28 NOTE — Transfer of Care (Signed)
Immediate Anesthesia Transfer of Care Note  Patient: John Holt  Procedure(s) Performed: VAGAL NERVE STIMULATOR PLACEMENT (Left )  Patient Location: PACU  Anesthesia Type:General  Level of Consciousness: awake, alert  and oriented  Airway & Oxygen Therapy: Patient Spontanous Breathing and Patient connected to nasal cannula oxygen  Post-op Assessment: Report given to RN, Post -op Vital signs reviewed and stable and Patient moving all extremities X 4  Post vital signs: Reviewed and stable  Last Vitals:  Vitals Value Taken Time  BP 126/64 01/28/2018  3:19 PM  Temp    Pulse 64 01/28/2018  3:21 PM  Resp 8 01/28/2018  3:21 PM  SpO2 100 % 01/28/2018  3:21 PM  Vitals shown include unvalidated device data.  Last Pain:  Vitals:   01/28/18 1048  TempSrc: Oral         Complications: No apparent anesthesia complications

## 2018-01-28 NOTE — H&P (Signed)
Chief Complaint   VNS implant  HPI   HPI: John Holt is a 72 y.o. male with history of medically intractable temporal lobe epilepsy.  He is currently maintained on zonisamide and clobazam.  Unfortunately, he has significant side effects including mood swings and memory problems from his current antiepileptic medications. He presents today for VNS implantation. He is without any concerns. He has never had any neck surgery or radiation in the past.    Patient Active Problem List   Diagnosis Date Noted  . Localization-related epilepsy ([place]) 08/11/2012    PMH: Past Medical History:  Diagnosis Date  . Anxiety disorder   . Bipolar 1 disorder, mixed ([place])    w/ hx psychosis/ dissociative reactions/  suicide ideations and attempt  . BPH (benign prostatic hypertrophy) with urinary obstruction   . Cancer [facility])    Bladder Cancer 2018  . Depression   . Frequency of urination   . History of adenomatous polyp of colon   . History of closed head injury    2000-- fell off ladder--  temporary memory loss resolved  . History of panic attacks   . History of psychosis    x2  last one documented 2006  w/ hallucination  and suicide ideation  . Nocturia   . Seizures ([place])   . Sigmoid diverticulosis   . Temporal lobe epilepsy syndrome [facility]) neurologist-  dr Leta Baptist--  Linden Dolin one every 2 weeks "legs jerking around, per wife , pt unaware he's doing this"   localization-related right temporal lobe --  mostly nocturnal w/ bilateral lower extremitity movement, staring and moaning then dissorietation afterwards (last daytime seizure June 2013)  . Urge urinary incontinence   . Vesicoureteral reflux, bilateral   . Wears glasses     PSH: Past Surgical History:  Procedure Laterality Date  . COLONOSCOPY    . COLONOSCOPY W/ POLYPECTOMY  09-11-2009  . GREEN LIGHT LASER TURP (TRANSURETHRAL RESECTION OF PROSTATE N/A 04/07/2014   Procedure: GREEN LIGHT LASER TURP (TRANSURETHRAL RESECTION OF PROSTATE;   Surgeon: Ailene Rud, MD;  [facility];  Service: Urology;  Laterality: N/A;  . INGUINAL HERNIA REPAIR Bilateral 04/15/2013   Procedure: LAPAROSCOPIC BILATERAL INGUINAL HERNIA REPAIR;  Surgeon: Gayland Curry, MD;  Location: [facility];  Service: General;  Laterality: Bilateral;  . INSERTION OF MESH N/A 04/15/2013   Procedure: INSERTION OF MESH;  Surgeon: Gayland Curry, MD;  Location: [facility];  Service: General;  Laterality: N/A;  . POLYPECTOMY    . TRANSURETHRAL RESECTION OF BLADDER  2018  . UMBILICAL HERNIA REPAIR N/A 04/15/2013   Procedure: OPEN HERNIA REPAIR UMBILICAL ADULT;  Surgeon: Gayland Curry, MD;  Location: [facility];  Service: General;  Laterality: N/A;    No medications prior to admission.    SH: Social History   Tobacco Use  . Smoking status: Former Smoker    Packs/day: 1.00    Years: 15.00    Pack years: 15.00    Types: Cigarettes    Last attempt to quit: 04/03/1972    Years since quitting: 45.8  . Smokeless tobacco: Never Used  Substance Use Topics  . Alcohol use: Yes    Alcohol/week: 14.0 standard drinks    Types: 14 Glasses of wine per week    Comment: 2 wine daily  . Drug use: No    MEDS: Prior to Admission medications   Medication Sig Start Date End Date Taking? Authorizing Provider  b complex vitamins tablet Take 1  tablet by mouth daily.   Yes [provider]  cloBAZam (ONFI) 10 MG tablet Take 1.5 tablets daily Patient taking differently: Take 15 mg by mouth daily.  12/23/17  Yes Cameron Sprang, MD  clonazePAM (KLONOPIN) 0.5 MG tablet Take 0.5 mg by mouth daily as needed (epilepsy).  04/30/14  Yes [provider]  Magnesium 500 MG TABS Take 500 mg by mouth daily.    Yes [provider]  multivitamin-lutein (OCUVITE-LUTEIN) CAPS capsule Take 1 capsule by mouth daily.   Yes [provider]  tadalafil (CIALIS) 5 MG tablet Take 20 mg by mouth daily as needed for erectile dysfunction.    Yes [provider]  zonisamide (ZONEGRAN) 100 MG capsule Take 4 capsules each night Patient taking differently: Take 300 mg by mouth at bedtime.  12/11/17  Yes Cameron Sprang, MD    ALLERGY: Allergies  Allergen Reactions  . Chlorhexidine Rash    Full body    Social History   Tobacco Use  . Smoking status: Former Smoker    Packs/day: 1.00    Years: 15.00    Pack years: 15.00    Types: Cigarettes    Last attempt to quit: 04/03/1972    Years since quitting: 45.8  . Smokeless tobacco: Never Used  Substance Use Topics  . Alcohol use: Yes    Alcohol/week: 14.0 standard drinks    Types: 14 Glasses of wine per week    Comment: 2 wine daily     Family History  Problem Relation Age of Onset  . Seizures Father   . Heart disease Father   . Cancer Father        prostat  . Cancer Mother        stomach  . Stomach cancer Mother   . Colon cancer Neg Hx   . Colon polyps Neg Hx   . Esophageal cancer Neg Hx   . Rectal cancer Neg Hx      ROS   ROS  Exam   There were no vitals filed for this visit. General appearance: WDWN, NAD Eyes: PERRL, Fundoscopic: normal Cardiovascular: Regular rate and rhythm without murmurs, rubs, gallops. No edema or variciosities. Distal pulses normal. Pulmonary: Clear to auscultation Musculoskeletal:     Muscle tone upper extremities: Normal    Muscle tone lower extremities: Normal    Motor exam: Upper Extremities Deltoid Bicep Tricep Grip  Right 5/5 5/5 5/5 5/5  Left 5/5 5/5 5/5 5/5   Lower Extremity IP Quad PF DF EHL  Right 5/5 5/5 5/5 5/5 5/5  Left 5/5 5/5 5/5 5/5 5/5   Neurological Awake, alert, oriented Memory and concentration grossly intact Speech fluent, appropriate CNII: Visual fields normal CNIII/IV/VI: EOMI CNV: Facial sensation normal CNVII: Symmetric, normal strength CNVIII: Grossly normal CNIX: Normal palate movement CNXI: Trap and SCM strength normal CN XII: Tongue protrusion normal Sensation grossly intact to LT DTR:  Normal Coordination (finger/nose & heel/shin): Normal  Results - Imaging/Labs   Results for orders placed or performed during the hospital encounter of 01/26/18 (from the past 48 hour(s))  CBC     Status: None   Collection Time: 01/26/18 11:51 AM  Result Value Ref Range   WBC 4.2 4.0 - 10.5 K/uL   RBC 4.51 4.22 - 5.81 MIL/uL   Hemoglobin 13.8 13.0 - 17.0 g/dL   HCT 42.8 39.0 - 52.0 %   MCV 94.9 78.0 - 100.0 fL   MCH 30.6 26.0 - 34.0 pg  MCHC 32.2 30.0 - 36.0 g/dL   RDW 13.8 11.5 - 15.5 %   Platelets 175 150 - 400 K/uL    Comment: Performed at [facility], [place] [facility] [place] [address], [place], [place] [address]    No results found.  Impression/Plan   72 y.o. male with medically intractable right-sided mesial temporal lobe epilepsy.  He has significant side effects from his current antiepileptic medications.  Given his age, he does not wish to pursue more definitive epilepsy surgery so it was rec that he undergo vagal nerve stimulator placement.  While in the office risks, benefits and alternatives were discussed. Patient stated in own language understanding and wished to proceed.

## 2018-01-29 ENCOUNTER — Encounter (HOSPITAL_COMMUNITY): Payer: Self-pay | Admitting: Neurosurgery

## 2018-02-01 ENCOUNTER — Telehealth: Payer: Self-pay

## 2018-02-01 NOTE — Telephone Encounter (Signed)
Received VM from pt's wife stating that pt had an allergic reaction to the postop adhesive tape after having VNS Sx.  Pt was seen by neurosurgeon today who prescribed Prednisone (but later in message states methoprednisone ?)  Neurosurgeon suggested pt ask Dr. Delice Lesch if Prednisone might interact with pt's seizure medications.  pls advise.

## 2018-02-01 NOTE — Telephone Encounter (Signed)
Pt made aware

## 2018-02-01 NOTE — Telephone Encounter (Signed)
No problem with seizure meds. Thanks

## 2018-02-10 ENCOUNTER — Encounter: Payer: Self-pay | Admitting: Neurology

## 2018-02-10 ENCOUNTER — Ambulatory Visit: Payer: Medicare Other | Admitting: Neurology

## 2018-02-10 VITALS — BP 100/62 | HR 63 | Ht 68.0 in | Wt 140.0 lb

## 2018-02-10 DIAGNOSIS — G40219 Localization-related (focal) (partial) symptomatic epilepsy and epileptic syndromes with complex partial seizures, intractable, without status epilepticus: Secondary | ICD-10-CM | POA: Diagnosis not present

## 2018-02-10 MED ORDER — CLOBAZAM 10 MG PO TABS: ORAL_TABLET | ORAL | 5 refills | Status: DC

## 2018-02-10 MED ORDER — ZONISAMIDE 100 MG PO CAPS: ORAL_CAPSULE | ORAL | 11 refills | Status: DC

## 2018-02-10 NOTE — Progress Notes (Signed)
NEUROLOGY FOLLOW UP OFFICE NOTE  KAHARI CRITZER [id number] [birth year]  HISTORY OF PRESENT ILLNESS: I had the pleasure of seeing John Holt in follow-up in the neurology clinic on 02/10/2018.  The patient was last seen 2 months ago for intractable epilepsy arising from the right mesial temporal or mesial frontal region.  He is again accompanied by his wife who helps supplement the history today. On his last visit, they reported an increase in seizures with reduction in Onfi to 10mg  from 20mg , as well as more mood changes on Zonisamide 400mg  qhs. Medication adjustments were made, Zonisamide reduced to 300mg  qhs, and we discussed slower taper of Onfi, take 15mg  qhs. His wife is happy to report that there have been no seizures since 12/18/17. Mood is slightly better. He underwent VNS placement last 01/28/18 and presents today to turn the device on. He has had an itchy rash over his neck and upper chest, although incisions look good. He has tried prednisone last week with no effect. He had a similar reaction after port placement, with the rash lasting 2 weeks.   HPI 05/13/2017: This is a very pleasant 72 yo RH man with a history of focal epilepsy of right mesial temporal or right mesial frontal onset. He had been seeing epileptologist Dr. Jacelyn Grip for several years, records were reviewed. He reports staring episodes in childhood. In his 58s, he recalls having episodes of a sinking feeling in his stomach with deja vu occurring around once a month initially. When he was started on seizure medications, these episodes occur rarely, around 1-2 a year. His wife started noticing possible symptoms in his 61s, they were in a meeting and she noticed him swaying back and forth but seemed unaware of it. He started having nocturnal spells more than 15 years ago, where he would have "leg thrusting" (bicycling type leg movements), lip smacking, often followed by arousal with disorientation, need to urinate and walking around (at  times resembling sleepwalking). He has had episodes where he would have a cluster of seizures then become paranoid and manic with significant post-ictal psychosis. He usually has nocturnal seizures every 3 weeks or so. His wife would give him prn clonazepam when he has clusters, and this has helped "zap" them. He has not needed clonazepam in at least 1.5 years. He has rare daytime seizures, his wife recalls the seizures in 2006 and 2013 with post-ictal psychosis, there is note of a spell of undressing and confusion in the [employer] at [place] in 2015. In the past he has been on Lamictal, Depakote, Gabapentin. Gabapentin has been tapered off. He has been taking Onfi for several years and feels this has been the most helpful. He was recently diagnosed with bladder cancer in October 2018 and underwent resection and chemotherapy. They report that towards the end of chemo, he had some incidents with seizures. He has no recollection of the events. His wife reports that after he had his second infusion and the PICC line was taken out, he started having slight feet movements and was staring and unresponsive. He had another episode while shoveling snow, he came in and started talking and his speech became garbled, "words were not right" for 60 seconds. Three days after on the way home from chemo, he had a similar episode with his speech. He was talking about Aristotle then his speech became garbled. The last daytime event was on 04/28/17. He started swaying side to side, speech was garbled, and he was staring off for  a few minutes. Due to these daytime episodes, his Onfi dose was temporarily increased to 40mg  qhs. They report he has not had any nocturnal seizures since November, which is unusual for him. The higher dose of Onfi knocks him out. They report he is done with chemotherapy and will be seeing his oncologist to discuss next steps.  He denies any headaches, dizziness, diplopia, dysarthria/dysphagia, neck/back  pain, focal numbness/tingling/weakness, bowel/bladder dysfunction, no falls. He had muscle cramps, taking magnesium supplements has helped. His memory is what bothers him a lot. They mostly notice problems with his "narrative memory." He would not recall watching a movie 2-3 weeks prior. He cannot recall anecdotes. He would make notes on an article, and later find that he had already made notes on the same article previously. He is able to remember abstract concepts from dense philosophy books he reads. His spatial memory is not good. He denies missing medications. Every once in a while he forgets his medications (twice in the past month). He drives only to the grocery and denies getting lost. He has had Neuropsychological testing, report unavailable for review.  Update 12/23/2017: On his last visit, he reported feeling bad, more depressed about his memory, and felt Onfi was contributing a lot to his symptoms. He had a seizure on 09/01/17 during wakefulness. He had a repeat MRI brain on 10/14/17 which showed asymmetric right hippocampal volume loss suggestive of mesial temporal sclerosis. He was started on Zonisamide and did very well with no seizures for 9 weeks (baseline nocturnal seizures every 2 weeks or so). He reduced Onfi to 20mg  and his wife reported he was doing great, his sense of humor was back, he was laughing, at one point his wife thought the euphoria was almost too much. She noticed this as a big difference from how he was the past couple of years where he had a "flattening" of mood. Unfortunately, after he reduced the Onfi to 10mg  on 12/02/17, his wife noticed almost immediately that his mood was a little darker and he was more irritable. He had a nocturnal seizure on 8/9, then another on 8/14. With each seizure, his mood was going "way, way down." He was instructed in increase Zonisamide to 400mg  qhs. He then had a seizure during the afternoon of 12/18/17. He is tearful in the office today and has some  difficulty finding his words, reporting it was the end of a stressful week, he had been having feelings of anxiety that week. He had no warning and is amnestic of the seizure, his wife reports he clasped his hands together in front of him and was doing repetitive digging movements, unresponsive. He then smiled at her and grabbed her arms, jerking her around like he was dancing with her. This lasted 2-3 minutes, then he started answering questions. No focal weakness, tongue bite, or incontinence. He called our office and was instructed to increase Onfi back to 20mg  daily and Zonisamide down to 300mg  qhs. He has felt normal this week. He has had tingling in his feet with the Zonisamide, this has quieted down but not completely gone away. He denies any headaches, dizziness, vision changes, focal weakness, no falls.   Prior AEDs: Depakote, Lamictal, Tegretol, Trileptal, Keppra, Vimpat, Gabapentin  Epilepsy Risk Factors: His father had rare spells 1-2 times a year of "zonking out." He and his brother had "spasms" as babies, none after age 81/3. He recalls having a head injury in his teenage years. Otherwise he had a normal birth and  early development, no history of febrile convulsions, CNS infections, or neurosurgical procedures.   Prior workup: MRI brain 01/2014: subtle findings consistent with right mesial temporal sclerosis EEGs: EEG 2009 - right temporal spike activity and right TIRDA EEG 01/2009 - normal EEG 07/2012 - normal  MRI 2006 - normal  PET Brain 2016 - right mesial temporal hypometabolism.  EMU 04/25/2014 - 05/04/2014 6 events captured on camera, 1 off camera - repeated neck extension and flexion, followed by pelvic thrusting and body rocking movements. Appear to localize to the right temporal region, although clinically suggestive of frontal onset. Patient taken off Depakote and Lamictal and switched to high dose gabapentin  Neuropsychological testing in June 2019 indicated mild  neurocognitive disorder due to seizure disorder. Testing and daily functioning not consistent with dementia. Testing revealed mostly normal cognitive functioning. However, he did demonstrate mildly reduced visual-spatial construction and more impaired visual memory, consistent with right hemisphere involvement and in particular mesial temporal lobe involvement. He also demonstrated impairment in verbal memory of non-contextual information, but memory of contextual information was intact, suggesting more of a problem with frontal-subcortical networks than hippocampal consolidation dysfunction.  PAST MEDICAL HISTORY: Past Medical History:  Diagnosis Date  . Anxiety disorder   . Bipolar 1 disorder, mixed ([place])    w/ hx psychosis/ dissociative reactions/  suicide ideations and attempt  . BPH (benign prostatic hypertrophy) with urinary obstruction   . Cancer [facility])    Bladder Cancer 2018  . Depression   . Frequency of urination   . History of adenomatous polyp of colon   . History of closed head injury    2000-- fell off ladder--  temporary memory loss resolved  . History of panic attacks   . History of psychosis    x2  last one documented 2006  w/ hallucination  and suicide ideation  . Nocturia   . Seizures ([place])   . Sigmoid diverticulosis   . Temporal lobe epilepsy syndrome [facility]) neurologist-  dr Leta Baptist--  Linden Dolin one every 2 weeks "legs jerking around, per wife , pt unaware he's doing this"   localization-related right temporal lobe --  mostly nocturnal w/ bilateral lower extremitity movement, staring and moaning then dissorietation afterwards (last daytime seizure June 2013)  . Urge urinary incontinence   . Vesicoureteral reflux, bilateral   . Wears glasses     MEDICATIONS: Current Outpatient Medications on File Prior to Visit  Medication Sig Dispense Refill  . b complex vitamins tablet Take 1 tablet by mouth daily.    . cloBAZam (ONFI) 10 MG tablet Take 1.5 tablets daily (Patient  taking differently: Take 15 mg by mouth daily. ) 45 tablet 5  . clonazePAM (KLONOPIN) 0.5 MG tablet Take 0.5 mg by mouth daily as needed (epilepsy).     [name] HYDROcodone-acetaminophen (NORCO/VICODIN) 5-325 MG tablet Take 1 tablet by mouth every 6 (six) hours as needed for moderate pain. 20 tablet 0  . Magnesium 500 MG TABS Take 500 mg by mouth daily.     . multivitamin-lutein (OCUVITE-LUTEIN) CAPS capsule Take 1 capsule by mouth daily.    . tadalafil (CIALIS) 5 MG tablet Take 20 mg by mouth daily as needed for erectile dysfunction.     [name] zonisamide (ZONEGRAN) 100 MG capsule Take 4 capsules each night (Patient taking differently: Take 300 mg by mouth at bedtime. ) 120 capsule 6   No current facility-administered medications on file prior to visit.     ALLERGIES: Allergies  Allergen Reactions  . Chlorhexidine  Rash    Full body    FAMILY HISTORY: Family History  Problem Relation Age of Onset  . Seizures Father   . Heart disease Father   . Cancer Father        prostat  . Cancer Mother        stomach  . Stomach cancer Mother   . Colon cancer Neg Hx   . Colon polyps Neg Hx   . Esophageal cancer Neg Hx   . Rectal cancer Neg Hx     SOCIAL HISTORY: Social History   Socioeconomic History  . Marital status: Married    Spouse name: [name]  . Number of children: 2  . Years of education: PhD  . 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 Financial resource strain: Not on file  . Food insecurity:    Worry: Not on file    Inability: Not on file  . Transportation needs:    Medical: Not on file    Non-medical: Not on file  Tobacco Use  . Smoking status: Former Smoker    Packs/day: 1.00    Years: 15.00    Pack years: 15.00    Types: Cigarettes    Last attempt to quit: 04/03/1972    Years since quitting: 45.8  . Smokeless tobacco: Never Used  Substance and Sexual Activity  .  Alcohol use: Yes    Alcohol/week: 14.0 standard drinks    Types: 14 Glasses of wine per week    Comment: 2 wine daily  . Drug use: No  . Sexual activity: Not on file  Lifestyle  . Physical activity:    Days per week: Not on file    Minutes per session: Not on file  . Stress: Not on file  Relationships  . Social connections:    Talks on phone: Not on file    Gets together: Not on file    Attends religious service: Not on file    Active member of club or organization: Not on file    Attends meetings of clubs or organizations: Not on file    Relationship status: Not on file  . Intimate partner violence:    Fear of current or ex partner: Not on file    Emotionally abused: Not on file    Physically abused: Not on file    Forced sexual activity: Not on file  Other Topics Concern  . Not on file  Social History Narrative      Pt lives at home with his spouse.   Caffeine Use- 2 cups daily    REVIEW OF SYSTEMS: Constitutional: No fevers, chills, or sweats, no generalized fatigue, change in appetite Eyes: No visual changes, double vision, eye pain Ear, nose and throat: No hearing loss, ear pain, nasal congestion, sore throat Cardiovascular: No chest pain, palpitations Respiratory:  No shortness of breath at rest or with exertion, wheezes GastrointestinaI: No nausea, vomiting, diarrhea, abdominal pain, fecal incontinence Genitourinary:  No dysuria, urinary retention or frequency Musculoskeletal:  No neck pain, back pain Integumentary: No rash, pruritus, skin lesions Neurological: as above Psychiatric: + depression, anxiety Endocrine: No palpitations, fatigue, diaphoresis, mood swings, change in appetite, change in weight, increased thirst Hematologic/Lymphatic:  No anemia, purpura, petechiae. Allergic/Immunologic: no itchy/runny eyes, nasal congestion, recent allergic reactions, rashes  PHYSICAL EXAM: Vitals:   02/10/18 1334  BP: 100/62  Pulse: 63  SpO2: 100%  General: No  acute distress, flat affect but makes jokes during the visit Head:  Normocephalic/atraumatic Skin/Extremities: +erythema over the left side of his neck and upper chest, well-healing incisions Neurological Exam: alert and oriented to person, place, and time. No aphasia or dysarthria. Fund of knowledge is appropriate.  Recent and remote memory are intact.  Attention and concentration are normal.    Able to name objects and repeat phrases. Cranial nerves: Pupils equal, round. No facial asymmetry. Motor: Bulk and tone normal, moves all extremities symmetrically. Gait narrow-based and steady.  VNS Therapy Management: Unit Information Implant Date: 01/28/18 Serial Number: [id number] Generator Number: 106([id number] [id number]) Parameters Output Current (mA): 0.25 Signal Frequency (Hz): 20 Pulse Width (usec): 250 Signal ON Time (sec): 30 Signal OFF Time (min): 5 Magnet Output Current (mA): 0.5 Magnet ON Time (sec): 60 Magnet Pulse Width (usec): 500 AutoStim Output Current (mA): 0.375 AutoStim Pulse Width (usec): 250 AutoStim ON Time (sec): 30 Tachycardia Detection : On Heartbeat Detection Sensitivity: 1 Perform Verify Heartbeat Detection: yes Threshold for AutoStim (%): 40 Diagnostics Current Delievered (mA): 0.25 Lead Impedance: OK Impedence Value (Ohms): 2966 Battery Status Indicator (color): Green  IMPRESSION: This is a very pleasant 72 yo RH man with a history of focal epilepsy of right mesial temporal or right mesial frontal onset. Repeat MRI brain showed right hippocampal asymmetry suggestive of mesial temporal sclerosis. PET scan showed right mesial temporal hypometabolism. EEGs in the past showed right temporal spikes and TIRDA, EMU admission captured seizures that appeared to localize to the right temporal area although clinically suggestive of frontal onset. He is currently on Zonisamide 300mg  qhs and Onfi 15mg  qhs with no seizures since 12/18/17. He underwent VNS placement, device turned on  today with output current of 0.45mA, which he tolerated well. He has a rash, possibly from latex/tape, he had a similar reaction in the past with port placement, continue to monitor for now. Continue current medications. Post-VNS placement instructions given today, he was instructed to start swiping the magnet twice a day and before/during/after seizure. He is aware of [place] driving laws to stop driving after a seizure until 6 months seizure-free. He will follow-up in 1 month for further VNS setting adjustment, they know to call for any changes.   Thank you for allowing me to participate in his care.  Please do not hesitate to call for any questions or concerns.  The duration of this appointment visit was 30 minutes of face-to-face time with the patient.  Greater than 50% of this time was spent in counseling, explanation of diagnosis, planning of further management, and coordination of care.   Ellouise Newer, M.D.   CC: Dr. Dagmar Hait, Dr. Kathyrn Sheriff

## 2018-02-10 NOTE — Patient Instructions (Signed)
1. Continue all your current medications 2. Start swiping the magnet twice a day starting Friday to get used to the stimulation 3. Have a safe trip! 4. Follow-up in 1 month, call for any changes  Seizure Precautions: 1. If medication has been prescribed for you to prevent seizures, take it exactly as directed.  Do not stop taking the medicine without talking to your doctor first, even if you have not had a seizure in a long time.   2. Avoid activities in which a seizure would cause danger to yourself or to others.  Don't operate dangerous machinery, swim alone, or climb in high or dangerous places, such as on ladders, roofs, or girders.  Do not drive unless your doctor says you may.  3. If you have any warning that you may have a seizure, lay down in a safe place where you can't hurt yourself.    4.  No driving for 6 months from last seizure, as per [facility].   Please refer to the following link on the [place] website for more information: http://[email or web address]/[email or web address]   5.  Maintain good sleep hygiene. Avoid alcohol.  6.  Contact your doctor if you have any problems that may be related to the medicine you are taking.  7.  Call 911 and bring the patient back to the ED if:        A.  The seizure lasts longer than 5 minutes.       B.  The patient doesn't awaken shortly after the seizure  C.  The patient has new problems such as difficulty seeing, speaking or moving  D.  The patient was injured during the seizure  E.  The patient has a temperature over 102 F (39C)  F.  The patient vomited and now is having trouble breathing

## 2018-02-16 ENCOUNTER — Encounter: Payer: Self-pay | Admitting: Neurology

## 2018-03-15 ENCOUNTER — Other Ambulatory Visit: Payer: Self-pay

## 2018-03-15 ENCOUNTER — Encounter: Payer: Self-pay | Admitting: Neurology

## 2018-03-15 ENCOUNTER — Ambulatory Visit: Payer: Medicare Other | Admitting: Neurology

## 2018-03-15 VITALS — BP 94/60 | HR 66 | Ht 68.0 in | Wt 136.0 lb

## 2018-03-15 DIAGNOSIS — G40219 Localization-related (focal) (partial) symptomatic epilepsy and epileptic syndromes with complex partial seizures, intractable, without status epilepticus: Secondary | ICD-10-CM | POA: Diagnosis not present

## 2018-03-15 NOTE — Progress Notes (Signed)
NEUROLOGY FOLLOW UP OFFICE NOTE  John Holt [id number] 22-Oct-1945  HISTORY OF PRESENT ILLNESS: I had the pleasure of seeing John Holt in follow-up in the neurology clinic on 03/15/2018.  The patient was last seen a month ago for intractable epilepsy arising from the right mesial temporal or mesial frontal region.  He is again accompanied by his wife who helps supplement the history today. He underwent VNS placement last 01/28/18, VNS turned on last visit, which he is tolerating without difficulties. He is also taking Onfi 15mg  qhs and Zonisamide 300mg  qhs. Last seizure was on 02/12/18 upon awakening, similar to prior seizures. He has been doing very well since then, he had a good trip to [place] without difficulties. The rash over his chest has completely gone away. His wife reports his mood has been excellent. He denies any headaches, dizziness, vision changes, no falls.   HPI 05/13/2017: This is a very pleasant 72 yo RH man with a history of focal epilepsy of right mesial temporal or right mesial frontal onset. He had been seeing epileptologist Dr. Jacelyn Grip for several years, records were reviewed. He reports staring episodes in childhood. In his 55s, he recalls having episodes of a sinking feeling in his stomach with deja vu occurring around once a month initially. When he was started on seizure medications, these episodes occur rarely, around 1-2 a year. His wife started noticing possible symptoms in his 37s, they were in a meeting and she noticed him swaying back and forth but seemed unaware of it. He started having nocturnal spells more than 15 years ago, where he would have "leg thrusting" (bicycling type leg movements), lip smacking, often followed by arousal with disorientation, need to urinate and walking around (at times resembling sleepwalking). He has had episodes where he would have a cluster of seizures then become paranoid and manic with significant post-ictal psychosis. He usually has  nocturnal seizures every 3 weeks or so. His wife would give him prn clonazepam when he has clusters, and this has helped "zap" them. He has not needed clonazepam in at least 1.5 years. He has rare daytime seizures, his wife recalls the seizures in 2006 and 2013 with post-ictal psychosis, there is note of a spell of undressing and confusion in the [employer] at [place] in 2015. In the past he has been on Lamictal, Depakote, Gabapentin. Gabapentin has been tapered off. He has been taking Onfi for several years and feels this has been the most helpful. He was recently diagnosed with bladder cancer in October 2018 and underwent resection and chemotherapy. They report that towards the end of chemo, he had some incidents with seizures. He has no recollection of the events. His wife reports that after he had his second infusion and the PICC line was taken out, he started having slight feet movements and was staring and unresponsive. He had another episode while shoveling snow, he came in and started talking and his speech became garbled, "words were not right" for 60 seconds. Three days after on the way home from chemo, he had a similar episode with his speech. He was talking about Aristotle then his speech became garbled. The last daytime event was on 04/28/17. He started swaying side to side, speech was garbled, and he was staring off for a few minutes. Due to these daytime episodes, his Onfi dose was temporarily increased to 40mg  qhs. They report he has not had any nocturnal seizures since November, which is unusual for him. The higher dose  of [place] him out. They report he is done with chemotherapy and will be seeing his oncologist to discuss next steps.  He denies any headaches, dizziness, diplopia, dysarthria/dysphagia, neck/back pain, focal numbness/tingling/weakness, bowel/bladder dysfunction, no falls. He had muscle cramps, taking magnesium supplements has helped. His memory is what bothers him a lot. They  mostly notice problems with his "narrative memory." He would not recall watching a movie 2-3 weeks prior. He cannot recall anecdotes. He would make notes on an article, and later find that he had already made notes on the same article previously. He is able to remember abstract concepts from dense philosophy books he reads. His spatial memory is not good. He denies missing medications. Every once in a while he forgets his medications (twice in the past month). He drives only to the grocery and denies getting lost. He has had Neuropsychological testing, report unavailable for review.  Update 12/23/2017: On his last visit, he reported feeling bad, more depressed about his memory, and felt Onfi was contributing a lot to his symptoms. He had a seizure on 09/01/17 during wakefulness. He had a repeat MRI brain on 10/14/17 which showed asymmetric right hippocampal volume loss suggestive of mesial temporal sclerosis. He was started on Zonisamide and did very well with no seizures for 9 weeks (baseline nocturnal seizures every 2 weeks or so). He reduced Onfi to 20mg  and his wife reported he was doing great, his sense of humor was back, he was laughing, at one point his wife thought the euphoria was almost too much. She noticed this as a big difference from how he was the past couple of years where he had a "flattening" of mood. Unfortunately, after he reduced the Onfi to 10mg  on 12/02/17, his wife noticed almost immediately that his mood was a little darker and he was more irritable. He had a nocturnal seizure on 8/9, then another on 8/14. With each seizure, his mood was going "way, way down." He was instructed in increase Zonisamide to 400mg  qhs. He then had a seizure during the afternoon of 12/18/17. He is tearful in the office today and has some difficulty finding his words, reporting it was the end of a stressful week, he had been having feelings of anxiety that week. He had no warning and is amnestic of the seizure, his wife  reports he clasped his hands together in front of him and was doing repetitive digging movements, unresponsive. He then smiled at her and grabbed her arms, jerking her around like he was dancing with her. This lasted 2-3 minutes, then he started answering questions. No focal weakness, tongue bite, or incontinence. He called our office and was instructed to increase Onfi back to 20mg  daily and Zonisamide down to 300mg  qhs. He has felt normal this week. He has had tingling in his feet with the Zonisamide, this has quieted down but not completely gone away. He denies any headaches, dizziness, vision changes, focal weakness, no falls.   Prior AEDs: Depakote, Lamictal, Tegretol, Trileptal, Keppra, Vimpat, Gabapentin  Epilepsy Risk Factors: His father had rare spells 1-2 times a year of "zonking out." He and his brother had "spasms" as babies, none after age 89/3. He recalls having a head injury in his teenage years. Otherwise he had a normal birth and early development, no history of febrile convulsions, CNS infections, or neurosurgical procedures.   Prior workup: MRI brain 01/2014: subtle findings consistent with right mesial temporal sclerosis EEGs: EEG 2009 - right temporal spike activity and  right TIRDA EEG 01/2009 - normal EEG 07/2012 - normal  MRI 2006 - normal  PET Brain 2016 - right mesial temporal hypometabolism.  EMU 04/25/2014 - 05/04/2014 6 events captured on camera, 1 off camera - repeated neck extension and flexion, followed by pelvic thrusting and body rocking movements. Appear to localize to the right temporal region, although clinically suggestive of frontal onset. Patient taken off Depakote and Lamictal and switched to high dose gabapentin  Neuropsychological testing in June 2019 indicated mild neurocognitive disorder due to seizure disorder. Testing and daily functioning not consistent with dementia. Testing revealed mostly normal cognitive functioning. However, he did demonstrate  mildly reduced visual-spatial construction and more impaired visual memory, consistent with right hemisphere involvement and in particular mesial temporal lobe involvement. He also demonstrated impairment in verbal memory of non-contextual information, but memory of contextual information was intact, suggesting more of a problem with frontal-subcortical networks than hippocampal consolidation dysfunction.  PAST MEDICAL HISTORY: Past Medical History:  Diagnosis Date  . Anxiety disorder   . Bipolar 1 disorder, mixed ([place])    w/ hx psychosis/ dissociative reactions/  suicide ideations and attempt  . BPH (benign prostatic hypertrophy) with urinary obstruction   . Cancer [facility])    Bladder Cancer 2018  . Depression   . Frequency of urination   . History of adenomatous polyp of colon   . History of closed head injury    2000-- fell off ladder--  temporary memory loss resolved  . History of panic attacks   . History of psychosis    x2  last one documented 2006  w/ hallucination  and suicide ideation  . Nocturia   . Seizures ([place])   . Sigmoid diverticulosis   . Temporal lobe epilepsy syndrome [facility]) neurologist-  dr Leta Baptist--  Linden Dolin one every 2 weeks "legs jerking around, per wife , pt unaware he's doing this"   localization-related right temporal lobe --  mostly nocturnal w/ bilateral lower extremitity movement, staring and moaning then dissorietation afterwards (last daytime seizure June 2013)  . Urge urinary incontinence   . Vesicoureteral reflux, bilateral   . Wears glasses     MEDICATIONS: Current Outpatient Medications on File Prior to Visit  Medication Sig Dispense Refill  . b complex vitamins tablet Take 1 tablet by mouth daily.    . cloBAZam (ONFI) 10 MG tablet Take 1.5 tablets daily 45 tablet 5  . clonazePAM (KLONOPIN) 0.5 MG tablet Take 0.5 mg by mouth daily as needed (epilepsy).     [name] HYDROcodone-acetaminophen (NORCO/VICODIN) 5-325 MG tablet Take 1 tablet by mouth every 6  (six) hours as needed for moderate pain. 20 tablet 0  . Magnesium 500 MG TABS Take 500 mg by mouth daily.     . multivitamin-lutein (OCUVITE-LUTEIN) CAPS capsule Take 1 capsule by mouth daily.    . tadalafil (CIALIS) 5 MG tablet Take 20 mg by mouth daily as needed for erectile dysfunction.     [name] zonisamide (ZONEGRAN) 100 MG capsule Take 3 caps every night 90 capsule 11   No current facility-administered medications on file prior to visit.     ALLERGIES: Allergies  Allergen Reactions  . Chlorhexidine Rash    Full body    FAMILY HISTORY: Family History  Problem Relation Age of Onset  . Seizures Father   . Heart disease Father   . Cancer Father        prostat  . Cancer Mother        stomach  . Stomach  cancer Mother   . Colon cancer Neg Hx   . Colon polyps Neg Hx   . Esophageal cancer Neg Hx   . Rectal cancer Neg Hx     SOCIAL HISTORY: Social History   Socioeconomic History  . Marital status: Married    Spouse name: [name]  . Number of children: 2  . Years of education: PhD  . 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 Financial resource strain: Not on file  . Food insecurity:    Worry: Not on file    Inability: Not on file  . Transportation needs:    Medical: Not on file    Non-medical: Not on file  Tobacco Use  . Smoking status: Former Smoker    Packs/day: 1.00    Years: 15.00    Pack years: 15.00    Types: Cigarettes    Last attempt to quit: 04/03/1972    Years since quitting: 45.9  . Smokeless tobacco: Never Used  Substance and Sexual Activity  . Alcohol use: Yes    Alcohol/week: 14.0 standard drinks    Types: 14 Glasses of wine per week    Comment: 2 wine daily  . Drug use: No  . Sexual activity: Not on file  Lifestyle  . Physical activity:    Days per week: Not on file    Minutes per session: Not on file  . Stress: Not on file  Relationships    . Social connections:    Talks on phone: Not on file    Gets together: Not on file    Attends religious service: Not on file    Active member of club or organization: Not on file    Attends meetings of clubs or organizations: Not on file    Relationship status: Not on file  . Intimate partner violence:    Fear of current or ex partner: Not on file    Emotionally abused: Not on file    Physically abused: Not on file    Forced sexual activity: Not on file  Other Topics Concern  . Not on file  Social History Narrative      Pt lives at home with his spouse.   Caffeine Use- 2 cups daily    REVIEW OF SYSTEMS: Constitutional: No fevers, chills, or sweats, no generalized fatigue, change in appetite Eyes: No visual changes, double vision, eye pain Ear, nose and throat: No hearing loss, ear pain, nasal congestion, sore throat Cardiovascular: No chest pain, palpitations Respiratory:  No shortness of breath at rest or with exertion, wheezes GastrointestinaI: No nausea, vomiting, diarrhea, abdominal pain, fecal incontinence Genitourinary:  No dysuria, urinary retention or frequency Musculoskeletal:  No neck pain, back pain Integumentary: No rash, pruritus, skin lesions Neurological: as above Psychiatric: + depression, anxiety Endocrine: No palpitations, fatigue, diaphoresis, mood swings, change in appetite, change in weight, increased thirst Hematologic/Lymphatic:  No anemia, purpura, petechiae. Allergic/Immunologic: no itchy/runny eyes, nasal congestion, recent allergic reactions, rashes  PHYSICAL EXAM: Vitals:   03/15/18 1304  BP: 94/60  Pulse: 66  SpO2: 99%   General: No acute distress,mood much improved during today's visit Head:  Normocephalic/atraumatic Skin/Extremities: no rash Neurological Exam: alert and oriented to person, place, and time. No aphasia or dysarthria. Fund of knowledge is appropriate.  Recent and remote memory are intact.  Attention and concentration are  normal.    Able to name  objects and repeat phrases. Cranial nerves: Pupils equal, round. No facial asymmetry. Motor: Bulk and tone normal, moves all extremities symmetrically. Gait narrow-based and steady.  VNS Therapy Management: Parameters Output Current (mA): 0.375 Signal Frequency (Hz): 20 Pulse Width (usec): 250 Signal ON Time (sec): 30 Signal OFF Time (min): 5 Magnet Output Current (mA): 0.625 Magnet ON Time (sec): 60 Magnet Pulse Width (usec): 500 AutoStim Output Current (mA): 0.5 AutoStim Pulse Width (usec): 250 AutoStim ON Time (sec): 30 Tachycardia Detection : On Heartbeat Detection Sensitivity: 1 Perform Verify Heartbeat Detection: yes Threshold for AutoStim (%): 40 Diagnostics Current Delievered (mA): 0.375 Lead Impedance: OK Impedence Value (Ohms): 3086 Battery Status Indicator (color): Green  IMPRESSION: This is a very pleasant 72 yo RH man with a history of focal epilepsy of right mesial temporal or right mesial frontal onset. Repeat MRI brain showed right hippocampal asymmetry suggestive of mesial temporal sclerosis. PET scan showed right mesial temporal hypometabolism. EEGs in the past showed right temporal spikes and TIRDA, EMU admission captured seizures that appeared to localize to the right temporal area although clinically suggestive of frontal onset. He is currently on Zonisamide 300mg  qhs and Onfi 15mg  qhs with no seizures since 02/12/2018. VNS settings adjusted today (3 setting changes) with output current increased to 0.370mA. Mood is much improved. He continues to be interested in weaning down Onfi, which we will do slowly starting on his next visit. He is aware of [place] driving laws to stop driving after a seizure until 6 months seizure-free. He will follow-up in 1 month for further VNS setting adjustment, they know to call for any changes.   Thank you for allowing me to participate in his care.  Please do not hesitate to call for any questions or concerns.  The  duration of this appointment visit was 30 minutes of face-to-face time with the patient.  Greater than 50% of this time was spent in counseling, explanation of diagnosis, planning of further management, and coordination of care.   Ellouise Newer, M.D.   CC: Dr. Dagmar Hait

## 2018-03-15 NOTE — Patient Instructions (Signed)
Great seeing you! Continue all medications for now, we will plan to reduce Onfi on your next visit. Follow-up in 1 month, call for any changes.  Seizure Precautions: 1. If medication has been prescribed for you to prevent seizures, take it exactly as directed.  Do not stop taking the medicine without talking to your doctor first, even if you have not had a seizure in a long time.   2. Avoid activities in which a seizure would cause danger to yourself or to others.  Don't operate dangerous machinery, swim alone, or climb in high or dangerous places, such as on ladders, roofs, or girders.  Do not drive unless your doctor says you may.  3. If you have any warning that you may have a seizure, lay down in a safe place where you can't hurt yourself.    4.  No driving for 6 months from last seizure, as per [facility].   Please refer to the following link on the [place] website for more information: http://[email or web address]/[email or web address]/drivingu.cfm   5.  Maintain good sleep hygiene. Avoid alcohol.  6.  Contact your doctor if you have any problems that may be related to the medicine you are taking.  7.  Call 911 and bring the patient back to the ED if:        A.  The seizure lasts longer than 5 minutes.       B.  The patient doesn't awaken shortly after the seizure  C.  The patient has new problems such as difficulty seeing, speaking or moving  D.  The patient was injured during the seizure  E.  The patient has a temperature over 102 F (39C)  F.  The patient vomited and now is having trouble breathing

## 2018-04-14 ENCOUNTER — Encounter: Payer: Self-pay | Admitting: Neurology

## 2018-04-14 ENCOUNTER — Ambulatory Visit: Payer: Medicare Other | Admitting: Neurology

## 2018-04-14 ENCOUNTER — Other Ambulatory Visit: Payer: Self-pay

## 2018-04-14 VITALS — BP 106/64 | HR 67 | Ht 68.0 in | Wt 139.0 lb

## 2018-04-14 DIAGNOSIS — G40219 Localization-related (focal) (partial) symptomatic epilepsy and epileptic syndromes with complex partial seizures, intractable, without status epilepticus: Secondary | ICD-10-CM

## 2018-04-14 MED ORDER — CLOBAZAM 10 MG PO TABS: ORAL_TABLET | ORAL | 5 refills | Status: DC

## 2018-04-14 NOTE — Patient Instructions (Addendum)
1. Let's try reducing Onfi slowly alternating between 10mg  and 15mg  at night  2. Continue Zonisamide 300mg  every night  3. Continue swiping magnet at least twice a day  4. Follow-up in 1 month. Hang in there!  Seizure Precautions: 1. If medication has been prescribed for you to prevent seizures, take it exactly as directed.  Do not stop taking the medicine without talking to your doctor first, even if you have not had a seizure in a long time.   2. Avoid activities in which a seizure would cause danger to yourself or to others.  Don't operate dangerous machinery, swim alone, or climb in high or dangerous places, such as on ladders, roofs, or girders.  Do not drive unless your doctor says you may.  3. If you have any warning that you may have a seizure, lay down in a safe place where you can't hurt yourself.    4.  No driving for 6 months from last seizure, as per [facility].   Please refer to the following link on the [place] website for more information: http://[email or web address].org/answerplace/Social/driving/drivingu.cfm   5.  Maintain good sleep hygiene. Avoid alcohol  6.  Contact your doctor if you have any problems that may be related to the medicine you are taking.  7.  Call 911 and bring the patient back to the ED if:        A.  The seizure lasts longer than 5 minutes.       B.  The patient doesn't awaken shortly after the seizure  C.  The patient has new problems such as difficulty seeing, speaking or moving  D.  The patient was injured during the seizure  E.  The patient has a temperature over 102 F (39C)  F.  The patient vomited and now is having trouble breathing

## 2018-04-14 NOTE — Progress Notes (Signed)
NEUROLOGY FOLLOW UP OFFICE NOTE  John Holt [id number] 06/10/1945  HISTORY OF PRESENT ILLNESS: I had the pleasure of seeing John Holt in follow-up in the neurology clinic on 04/14/2018.  The patient was last seen a month ago for intractable epilepsy arising from the right mesial temporal or mesial frontal region.  He is again accompanied by his wife who helps supplement the history today. VNS output current increased to 0.365mA on his last visit, he is feeling the stimulation more than with prior adjustment, he feels more throat tightening and discomfort, which has been making him anxious. He has not had any seizures since 02/12/18. He continues to take Onfi 15mg  qhs and Zonisamide 300mg  qhs. His wife reports he has been in a darker mood since last visit but not depressed. He denies any headaches, dizziness, vision changes, no falls.   HPI 05/13/2017: This is a very pleasant 72 yo RH man with a history of focal epilepsy of right mesial temporal or right mesial frontal onset. He had been seeing epileptologist Dr. Jacelyn Grip for several years, records were reviewed. He reports staring episodes in childhood. In his 58s, he recalls having episodes of a sinking feeling in his stomach with deja vu occurring around once a month initially. When he was started on seizure medications, these episodes occur rarely, around 1-2 a year. His wife started noticing possible symptoms in his 33s, they were in a meeting and she noticed him swaying back and forth but seemed unaware of it. He started having nocturnal spells more than 15 years ago, where he would have "leg thrusting" (bicycling type leg movements), lip smacking, often followed by arousal with disorientation, need to urinate and walking around (at times resembling sleepwalking). He has had episodes where he would have a cluster of seizures then become paranoid and manic with significant post-ictal psychosis. He usually has nocturnal seizures every 3 weeks or so.  His wife would give him prn clonazepam when he has clusters, and this has helped "zap" them. He has not needed clonazepam in at least 1.5 years. He has rare daytime seizures, his wife recalls the seizures in 2006 and 2013 with post-ictal psychosis, there is note of a spell of undressing and confusion in the [employer] at [place] in 2015. In the past he has been on Lamictal, Depakote, Gabapentin. Gabapentin has been tapered off. He has been taking Onfi for several years and feels this has been the most helpful. He was recently diagnosed with bladder cancer in October 2018 and underwent resection and chemotherapy. They report that towards the end of chemo, he had some incidents with seizures. He has no recollection of the events. His wife reports that after he had his second infusion and the PICC line was taken out, he started having slight feet movements and was staring and unresponsive. He had another episode while shoveling snow, he came in and started talking and his speech became garbled, "words were not right" for 60 seconds. Three days after on the way home from chemo, he had a similar episode with his speech. He was talking about Aristotle then his speech became garbled. The last daytime event was on 04/28/17. He started swaying side to side, speech was garbled, and he was staring off for a few minutes. Due to these daytime episodes, his Onfi dose was temporarily increased to 40mg  qhs. They report he has not had any nocturnal seizures since November, which is unusual for him. The higher dose of Onfi knocks him out.  They report he is done with chemotherapy and will be seeing his oncologist to discuss next steps.  He denies any headaches, dizziness, diplopia, dysarthria/dysphagia, neck/back pain, focal numbness/tingling/weakness, bowel/bladder dysfunction, no falls. He had muscle cramps, taking magnesium supplements has helped. His memory is what bothers him a lot. They mostly notice problems with his  "narrative memory." He would not recall watching a movie 2-3 weeks prior. He cannot recall anecdotes. He would make notes on an article, and later find that he had already made notes on the same article previously. He is able to remember abstract concepts from dense philosophy books he reads. His spatial memory is not good. He denies missing medications. Every once in a while he forgets his medications (twice in the past month). He drives only to the grocery and denies getting lost. He has had Neuropsychological testing, report unavailable for review.  Update 12/23/2017: On his last visit, he reported feeling bad, more depressed about his memory, and felt Onfi was contributing a lot to his symptoms. He had a seizure on 09/01/17 during wakefulness. He had a repeat MRI brain on 10/14/17 which showed asymmetric right hippocampal volume loss suggestive of mesial temporal sclerosis. He was started on Zonisamide and did very well with no seizures for 9 weeks (baseline nocturnal seizures every 2 weeks or so). He reduced Onfi to 20mg  and his wife reported he was doing great, his sense of humor was back, he was laughing, at one point his wife thought the euphoria was almost too much. She noticed this as a big difference from how he was the past couple of years where he had a "flattening" of mood. Unfortunately, after he reduced the Onfi to 10mg  on 12/02/17, his wife noticed almost immediately that his mood was a little darker and he was more irritable. He had a nocturnal seizure on 8/9, then another on 8/14. With each seizure, his mood was going "way, way down." He was instructed in increase Zonisamide to 400mg  qhs. He then had a seizure during the afternoon of 12/18/17. He is tearful in the office today and has some difficulty finding his words, reporting it was the end of a stressful week, he had been having feelings of anxiety that week. He had no warning and is amnestic of the seizure, his wife reports he clasped his hands  together in front of him and was doing repetitive digging movements, unresponsive. He then smiled at her and grabbed her arms, jerking her around like he was dancing with her. This lasted 2-3 minutes, then he started answering questions. No focal weakness, tongue bite, or incontinence. He called our office and was instructed to increase Onfi back to 20mg  daily and Zonisamide down to 300mg  qhs. He has felt normal this week. He has had tingling in his feet with the Zonisamide, this has quieted down but not completely gone away. He denies any headaches, dizziness, vision changes, focal weakness, no falls.   Prior AEDs: Depakote, Lamictal, Tegretol, Trileptal, Keppra, Vimpat, Gabapentin  Epilepsy Risk Factors: His father had rare spells 1-2 times a year of "zonking out." He and his brother had "spasms" as babies, none after age 44/3. He recalls having a head injury in his teenage years. Otherwise he had a normal birth and early development, no history of febrile convulsions, CNS infections, or neurosurgical procedures.   Prior workup: MRI brain 01/2014: subtle findings consistent with right mesial temporal sclerosis EEGs: EEG 2009 - right temporal spike activity and right TIRDA EEG 01/2009 -  normal EEG 07/2012 - normal  MRI 2006 - normal  PET Brain 2016 - right mesial temporal hypometabolism.  EMU 04/25/2014 - 05/04/2014 6 events captured on camera, 1 off camera - repeated neck extension and flexion, followed by pelvic thrusting and body rocking movements. Appear to localize to the right temporal region, although clinically suggestive of frontal onset. Patient taken off Depakote and Lamictal and switched to high dose gabapentin  Neuropsychological testing in June 2019 indicated mild neurocognitive disorder due to seizure disorder. Testing and daily functioning not consistent with dementia. Testing revealed mostly normal cognitive functioning. However, he did demonstrate mildly reduced visual-spatial  construction and more impaired visual memory, consistent with right hemisphere involvement and in particular mesial temporal lobe involvement. He also demonstrated impairment in verbal memory of non-contextual information, but memory of contextual information was intact, suggesting more of a problem with frontal-subcortical networks than hippocampal consolidation dysfunction.  PAST MEDICAL HISTORY: Past Medical History:  Diagnosis Date  . Anxiety disorder   . Bipolar 1 disorder, mixed ([place])    w/ hx psychosis/ dissociative reactions/  suicide ideations and attempt  . BPH (benign prostatic hypertrophy) with urinary obstruction   . Cancer [facility])    Bladder Cancer 2018  . Depression   . Frequency of urination   . History of adenomatous polyp of colon   . History of closed head injury    2000-- fell off ladder--  temporary memory loss resolved  . History of panic attacks   . History of psychosis    x2  last one documented 2006  w/ hallucination  and suicide ideation  . Nocturia   . Seizures ([place])   . Sigmoid diverticulosis   . Temporal lobe epilepsy syndrome [facility]) neurologist-  dr Leta Baptist--  Linden Dolin one every 2 weeks "legs jerking around, per wife , pt unaware he's doing this"   localization-related right temporal lobe --  mostly nocturnal w/ bilateral lower extremitity movement, staring and moaning then dissorietation afterwards (last daytime seizure June 2013)  . Urge urinary incontinence   . Vesicoureteral reflux, bilateral   . Wears glasses     MEDICATIONS: Current Outpatient Medications on File Prior to Visit  Medication Sig Dispense Refill  . b complex vitamins tablet Take 1 tablet by mouth daily.    . cloBAZam (ONFI) 10 MG tablet Take 1.5 tablets daily 45 tablet 5  . clonazePAM (KLONOPIN) 0.5 MG tablet Take 0.5 mg by mouth daily as needed (epilepsy).     [name] HYDROcodone-acetaminophen (NORCO/VICODIN) 5-325 MG tablet Take 1 tablet by mouth every 6 (six) hours as needed for  moderate pain. 20 tablet 0  . Magnesium 500 MG TABS Take 500 mg by mouth daily.     . multivitamin-lutein (OCUVITE-LUTEIN) CAPS capsule Take 1 capsule by mouth daily.    . tadalafil (CIALIS) 5 MG tablet Take 20 mg by mouth daily as needed for erectile dysfunction.     [name] zonisamide (ZONEGRAN) 100 MG capsule Take 3 caps every night 90 capsule 11   No current facility-administered medications on file prior to visit.     ALLERGIES: Allergies  Allergen Reactions  . Chlorhexidine Rash    Full body    FAMILY HISTORY: Family History  Problem Relation Age of Onset  . Seizures Father   . Heart disease Father   . Cancer Father        prostat  . Cancer Mother        stomach  . Stomach cancer Mother   .  Colon cancer Neg Hx   . Colon polyps Neg Hx   . Esophageal cancer Neg Hx   . Rectal cancer Neg Hx     SOCIAL HISTORY: Social History   Socioeconomic History  . Marital status: Married    Spouse name: [name]  . Number of children: 2  . Years of education: PhD  . 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 Financial resource strain: Not on file  . Food insecurity:    Worry: Not on file    Inability: Not on file  . Transportation needs:    Medical: Not on file    Non-medical: Not on file  Tobacco Use  . Smoking status: Former Smoker    Packs/day: 1.00    Years: 15.00    Pack years: 15.00    Types: Cigarettes    Last attempt to quit: 04/03/1972    Years since quitting: 46.0  . Smokeless tobacco: Never Used  Substance and Sexual Activity  . Alcohol use: Yes    Alcohol/week: 14.0 standard drinks    Types: 14 Glasses of wine per week    Comment: 2 wine daily  . Drug use: No  . Sexual activity: Not on file  Lifestyle  . Physical activity:    Days per week: Not on file    Minutes per session: Not on file  . Stress: Not on file  Relationships  . Social connections:     Talks on phone: Not on file    Gets together: Not on file    Attends religious service: Not on file    Active member of club or organization: Not on file    Attends meetings of clubs or organizations: Not on file    Relationship status: Not on file  . Intimate partner violence:    Fear of current or ex partner: Not on file    Emotionally abused: Not on file    Physically abused: Not on file    Forced sexual activity: Not on file  Other Topics Concern  . Not on file  Social History Narrative      Pt lives at home with his spouse.   Caffeine Use- 2 cups daily    REVIEW OF SYSTEMS: Constitutional: No fevers, chills, or sweats, no generalized fatigue, change in appetite Eyes: No visual changes, double vision, eye pain Ear, nose and throat: No hearing loss, ear pain, nasal congestion, sore throat Cardiovascular: No chest pain, palpitations Respiratory:  No shortness of breath at rest or with exertion, wheezes GastrointestinaI: No nausea, vomiting, diarrhea, abdominal pain, fecal incontinence Genitourinary:  No dysuria, urinary retention or frequency Musculoskeletal:  No neck pain, back pain Integumentary: No rash, pruritus, skin lesions Neurological: as above Psychiatric: + depression, anxiety Endocrine: No palpitations, fatigue, diaphoresis, mood swings, change in appetite, change in weight, increased thirst Hematologic/Lymphatic:  No anemia, purpura, petechiae. Allergic/Immunologic: no itchy/runny eyes, nasal congestion, recent allergic reactions, rashes  PHYSICAL EXAM: Vitals:   04/14/18 1318  BP: 106/64  Pulse: 67  SpO2: 99%   General: No acute distress,appears more depressed and anxious today Head:  Normocephalic/atraumatic Skin/Extremities: no rash Neurological Exam: alert and oriented to person, place, and time. No aphasia or dysarthria. Fund of knowledge is appropriate.  Recent and remote memory are intact.  Attention and concentration are normal.    Able to name  objects and repeat phrases. Cranial nerves:  Pupils equal, round. No facial asymmetry. Motor: Bulk and tone normal, moves all extremities symmetrically. Gait narrow-based and steady.  VNS Therapy Management: Parameters Output Current (mA): 0.375 Signal Frequency (Hz): 20 Pulse Width (usec): 250 Signal ON Time (sec): 30 Signal OFF Time (min): 5 Magnet Output Current (mA): 0.625 Magnet ON Time (sec): 60 Magnet Pulse Width (usec): 500 AutoStim Output Current (mA): 0.5 AutoStim Pulse Width (usec): 250 AutoStim ON Time (sec): 30 Tachycardia Detection : On Heartbeat Detection Sensitivity: 1 Perform Verify Heartbeat Detection: yes Threshold for AutoStim (%): 40 Diagnostics Current Delievered (mA): 0.375 Lead Impedance: OK Impedence Value (Ohms): 3562 Battery Status Indicator (color): Green(75-100%)  IMPRESSION: This is a very pleasant 72 yo RH man with a history of focal epilepsy of right mesial temporal or right mesial frontal onset. Repeat MRI brain showed right hippocampal asymmetry suggestive of mesial temporal sclerosis. PET scan showed right mesial temporal hypometabolism. EEGs in the past showed right temporal spikes and TIRDA, EMU admission captured seizures that appeared to localize to the right temporal area although clinically suggestive of frontal onset. He is s/p VNS placement and presents today for further adjustment, however reports some discomfort with current settings. VNS interrogated, no changes made today, we discussed staying on same settings for another month, continue swiping magnet twice a day. He has not had any seizures since 02/12/18, which is better than prior seizure control. He is anxious to reduce Onfi, we discussed doing it very slowly, alternate between 10mg  and 15mg  qhs. Continue Zonisamide 300mg  qhs. He is aware of [place] driving laws to stop driving after a seizure until 6 months seizure-free. He will follow-up in 1 month for further VNS setting adjustment, they know  to call for any changes.   Thank you for allowing me to participate in his care.  Please do not hesitate to call for any questions or concerns.  The duration of this appointment visit was 20 minutes of face-to-face time with the patient.  Greater than 50% of this time was spent in counseling, explanation of diagnosis, planning of further management, and coordination of care.   Ellouise Newer, M.D.   CC: Dr. Dagmar Hait

## 2018-05-13 ENCOUNTER — Ambulatory Visit: Payer: Medicare Other | Admitting: Neurology

## 2018-05-13 ENCOUNTER — Other Ambulatory Visit: Payer: Self-pay

## 2018-05-13 ENCOUNTER — Encounter: Payer: Self-pay | Admitting: Neurology

## 2018-05-13 VITALS — BP 116/64 | HR 59 | Ht 68.0 in | Wt 143.0 lb

## 2018-05-13 DIAGNOSIS — G40219 Localization-related (focal) (partial) symptomatic epilepsy and epileptic syndromes with complex partial seizures, intractable, without status epilepticus: Secondary | ICD-10-CM | POA: Diagnosis not present

## 2018-05-13 NOTE — Patient Instructions (Signed)
Let's reduce Onfi to 10mg  every night. Continue Zonisamide 300mg  every night. Follow-up next month, call for any changes  Seizure Precautions: 1. If medication has been prescribed for you to prevent seizures, take it exactly as directed.  Do not stop taking the medicine without talking to your doctor first, even if you have not had a seizure in a long time.   2. Avoid activities in which a seizure would cause danger to yourself or to others.  Don't operate dangerous machinery, swim alone, or climb in high or dangerous places, such as on ladders, roofs, or girders.  Do not drive unless your doctor says you may.  3. If you have any warning that you may have a seizure, lay down in a safe place where you can't hurt yourself.    4.  No driving for 6 months from last seizure, as per [facility].   Please refer to the following link on the [place] website for more information: [email or web address]   5.  Maintain good sleep hygiene. Avoid alcohol.  6.  Contact your doctor if you have any problems that may be related to the medicine you are taking.  7.  Call 911 and bring the patient back to the ED if:        A.  The seizure lasts longer than 5 minutes.       B.  The patient doesn't awaken shortly after the seizure  C.  The patient has new problems such as difficulty seeing, speaking or moving  D.  The patient was injured during the seizure  E.  The patient has a temperature over 102 F (39C)  F.  The patient vomited and now is having trouble breathing

## 2018-05-13 NOTE — Progress Notes (Signed)
NEUROLOGY FOLLOW UP OFFICE NOTE  John Holt [id number] 04-17-1946  HISTORY OF PRESENT ILLNESS: I had the pleasure of seeing John Holt in follow-up in the neurology clinic on 05/13/2018.  The patient was last seen a month ago for intractable epilepsy arising from the right mesial temporal or mesial frontal region.  He is again accompanied by his wife who helps supplement the history today. On his last visit, he was reporting more discomfort with his VNS output current of 0.375, we agreed to continue on current output for another month. We reduced Onfi to alternating doses of 10mg  and 15mg  every other night. He had a seizure on 04/16/18, they report it was shorter and less intense than usual. His mood is very good, he is happy with reduction in [place] and would like to further taper dose. He is not feeling the throat tightening and discomfort he was reporting with VNS previously and is anxious to continue uptitration. He denies any headaches, dizziness, vision changes, no falls. Tingling in his feet has diminished.  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 He had been seeing epileptologist Dr. Jacelyn Grip for several years, records were reviewed. He reports staring episodes in childhood. In his 69s, he recalls having episodes of a sinking feeling in his stomach with deja vu occurring around once a month initially. When he was started on seizure medications, these episodes occur rarely, around 1-2 a year. His wife started noticing possible symptoms in his 3s, they were in a meeting and she noticed him swaying back and forth but seemed unaware of it. He started having nocturnal spells more than 15 years ago, where he would have "leg thrusting" (bicycling type leg movements), lip smacking, often followed by arousal with disorientation, need to urinate and walking around (at times resembling sleepwalking). He has  had episodes where he would have a cluster of seizures then become paranoid and manic with significant post-ictal psychosis. He usually has nocturnal seizures every 3 weeks or so. His wife would give him prn clonazepam when he has clusters, and this has helped "zap" them. He has not needed clonazepam in at least 1.5 years. He has rare daytime seizures, his wife recalls the seizures in 2006 and 2013 with post-ictal psychosis, there is note of a spell of undressing and confusion in the [employer] at [place] in 2015. In the past he has been on Lamictal, Depakote, Gabapentin. Gabapentin has been tapered off. He has been taking Onfi for several years and feels this has been the most helpful. He was recently diagnosed with bladder cancer in October 2018 and underwent resection and chemotherapy. They report that towards the end of chemo, he had some incidents with seizures. He has no recollection of the events. His wife reports that after he had his second infusion and the PICC line was taken out, he started having slight feet movements and was staring and unresponsive. He had another episode while shoveling snow, he came in and started talking and his speech became garbled, "words were not right" for 60 seconds. Three days after on the way home from chemo, he had a similar episode with his speech. He was talking about Aristotle then his speech became garbled. The last daytime event was on 04/28/17. He started swaying side to side, speech was garbled, and he was staring off for a few minutes. Due to these daytime episodes, his  Onfi dose was temporarily increased to 40mg  qhs. They report he has not had any nocturnal seizures since November, which is unusual for him. The higher dose of Onfi knocks him out. They report he is done with chemotherapy and will be seeing his oncologist to discuss next steps.  He denies any headaches, dizziness, diplopia, dysarthria/dysphagia, neck/back pain, focal numbness/tingling/weakness,  bowel/bladder dysfunction, no falls. He had muscle cramps, taking magnesium supplements has helped. His memory is what bothers him a lot. They mostly notice problems with his "narrative memory." He would not recall watching a movie 2-3 weeks prior. He cannot recall anecdotes. He would make notes on an article, and later find that he had already made notes on the same article previously. He is able to remember abstract concepts from dense philosophy books he reads. His spatial memory is not good. He denies missing medications. Every once in a while he forgets his medications (twice in the past month). He drives only to the grocery and denies getting lost. He has had Neuropsychological testing, report unavailable for review.  Update 12/23/2017: On his last visit, he reported feeling bad, more depressed about his memory, and felt Onfi was contributing a lot to his symptoms. He had a seizure on 09/01/17 during wakefulness. He had a repeat MRI brain on 10/14/17 which showed asymmetric right hippocampal volume loss suggestive of mesial temporal sclerosis. He was started on Zonisamide and did very well with no seizures for 9 weeks (baseline nocturnal seizures every 2 weeks or so). He reduced Onfi to 20mg  and his wife reported he was doing great, his sense of humor was back, he was laughing, at one point his wife thought the euphoria was almost too much. She noticed this as a big difference from how he was the past couple of years where he had a "flattening" of mood. Unfortunately, after he reduced the Onfi to 10mg  on 12/02/17, his wife noticed almost immediately that his mood was a little darker and he was more irritable. He had a nocturnal seizure on 8/9, then another on 8/14. With each seizure, his mood was going "way, way down." He was instructed in increase Zonisamide to 400mg  qhs. He then had a seizure during the afternoon of 12/18/17. He is tearful in the office today and has some difficulty finding his words, reporting  it was the end of a stressful week, he had been having feelings of anxiety that week. He had no warning and is amnestic of the seizure, his wife reports he clasped his hands together in front of him and was doing repetitive digging movements, unresponsive. He then smiled at her and grabbed her arms, jerking her around like he was dancing with her. This lasted 2-3 minutes, then he started answering questions. No focal weakness, tongue bite, or incontinence. He called our office and was instructed to increase Onfi back to 20mg  daily and Zonisamide down to 300mg  qhs. He has felt normal this week. He has had tingling in his feet with the Zonisamide, this has quieted down but not completely gone away. He denies any headaches, dizziness, vision changes, focal weakness, no falls.   Prior AEDs: Depakote, Lamictal, Tegretol, Trileptal, Keppra, Vimpat, Gabapentin  Epilepsy Risk Factors: His father had rare spells 1-2 times a year of "zonking out." He and his brother had "spasms" as babies, none after age 51/3. He recalls having a head injury in his teenage years. Otherwise he had a normal birth and early development, no history of febrile convulsions, CNS infections,  or neurosurgical procedures.   Prior workup: MRI brain 01/2014: subtle findings consistent with right mesial temporal sclerosis EEGs: EEG 2009 - right temporal spike activity and right TIRDA EEG 01/2009 - normal EEG 07/2012 - normal  MRI 2006 - normal  PET Brain 2016 - right mesial temporal hypometabolism.  EMU 04/25/2014 - 05/04/2014 6 events captured on camera, 1 off camera - repeated neck extension and flexion, followed by pelvic thrusting and body rocking movements. Appear to localize to the right temporal region, although clinically suggestive of frontal onset. Patient taken off Depakote and Lamictal and switched to high dose gabapentin  Neuropsychological testing in June 2019 indicated mild neurocognitive disorder due to seizure  disorder. Testing and daily functioning not consistent with dementia. Testing revealed mostly normal cognitive functioning. However, he did demonstrate mildly reduced visual-spatial construction and more impaired visual memory, consistent with right hemisphere involvement and in particular mesial temporal lobe involvement. He also demonstrated impairment in verbal memory of non-contextual information, but memory of contextual information was intact, suggesting more of a problem with frontal-subcortical networks than hippocampal consolidation dysfunction.  PAST MEDICAL HISTORY: Past Medical History:  Diagnosis Date  . Anxiety disorder   . Bipolar 1 disorder, mixed ([place])    w/ hx psychosis/ dissociative reactions/  suicide ideations and attempt  . BPH (benign prostatic hypertrophy) with urinary obstruction   . Cancer [facility])    Bladder Cancer 2018  . Depression   . Frequency of urination   . History of adenomatous polyp of colon   . History of closed head injury    2000-- fell off ladder--  temporary memory loss resolved  . History of panic attacks   . History of psychosis    x2  last one documented 2006  w/ hallucination  and suicide ideation  . Nocturia   . Seizures ([place])   . Sigmoid diverticulosis   . Temporal lobe epilepsy syndrome [facility]) neurologist-  dr Leta Baptist--  Linden Dolin one every 2 weeks "legs jerking around, per wife , pt unaware he's doing this"   localization-related right temporal lobe --  mostly nocturnal w/ bilateral lower extremitity movement, staring and moaning then dissorietation afterwards (last daytime seizure June 2013)  . Urge urinary incontinence   . Vesicoureteral reflux, bilateral   . Wears glasses     MEDICATIONS: Current Outpatient Medications on File Prior to Visit  Medication Sig Dispense Refill  . b complex vitamins tablet Take 1 tablet by mouth daily.    . cloBAZam (ONFI) 10 MG tablet Take 1.5 tablets daily 45 tablet 5  . clonazePAM (KLONOPIN) 0.5 MG  tablet Take 0.5 mg by mouth daily as needed (epilepsy).     [name] HYDROcodone-acetaminophen (NORCO/VICODIN) 5-325 MG tablet Take 1 tablet by mouth every 6 (six) hours as needed for moderate pain. 20 tablet 0  . Magnesium 500 MG TABS Take 500 mg by mouth daily.     . multivitamin-lutein (OCUVITE-LUTEIN) CAPS capsule Take 1 capsule by mouth daily.    . tadalafil (CIALIS) 5 MG tablet Take 20 mg by mouth daily as needed for erectile dysfunction.     [name] zonisamide (ZONEGRAN) 100 MG capsule Take 3 caps every night 90 capsule 11   No current facility-administered medications on file prior to visit.     ALLERGIES: Allergies  Allergen Reactions  . Chlorhexidine Rash    Full body    FAMILY HISTORY: Family History  Problem Relation Age of Onset  . Seizures Father   . Heart disease Father   .  Cancer Father        prostat  . Cancer Mother        stomach  . Stomach cancer Mother   . Colon cancer Neg Hx   . Colon polyps Neg Hx   . Esophageal cancer Neg Hx   . Rectal cancer Neg Hx     SOCIAL HISTORY: Social History   Socioeconomic History  . Marital status: Married    Spouse name: [name]  . Number of children: 2  . Years of education: PhD  . 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 Financial resource strain: Not on file  . Food insecurity:    Worry: Not on file    Inability: Not on file  . Transportation needs:    Medical: Not on file    Non-medical: Not on file  Tobacco Use  . Smoking status: Former Smoker    Packs/day: 1.00    Years: 15.00    Pack years: 15.00    Types: Cigarettes    Last attempt to quit: 04/03/1972    Years since quitting: 46.1  . Smokeless tobacco: Never Used  Substance and Sexual Activity  . Alcohol use: Yes    Alcohol/week: 14.0 standard drinks    Types: 14 Glasses of wine per week    Comment: 2 wine daily  . Drug use: No  . Sexual activity: Not  on file  Lifestyle  . Physical activity:    Days per week: Not on file    Minutes per session: Not on file  . Stress: Not on file  Relationships  . Social connections:    Talks on phone: Not on file    Gets together: Not on file    Attends religious service: Not on file    Active member of club or organization: Not on file    Attends meetings of clubs or organizations: Not on file    Relationship status: Not on file  . Intimate partner violence:    Fear of current or ex partner: Not on file    Emotionally abused: Not on file    Physically abused: Not on file    Forced sexual activity: Not on file  Other Topics Concern  . Not on file  Social History Narrative      Pt lives at home with his spouse.   Caffeine Use- 2 cups daily    REVIEW OF SYSTEMS: Constitutional: No fevers, chills, or sweats, no generalized fatigue, change in appetite Eyes: No visual changes, double vision, eye pain Ear, nose and throat: No hearing loss, ear pain, nasal congestion, sore throat Cardiovascular: No chest pain, palpitations Respiratory:  No shortness of breath at rest or with exertion, wheezes GastrointestinaI: No nausea, vomiting, diarrhea, abdominal pain, fecal incontinence Genitourinary:  No dysuria, urinary retention or frequency Musculoskeletal:  No neck pain, back pain Integumentary: No rash, pruritus, skin lesions Neurological: as above Psychiatric: + depression, anxiety Endocrine: No palpitations, fatigue, diaphoresis, mood swings, change in appetite, change in weight, increased thirst Hematologic/Lymphatic:  No anemia, purpura, petechiae. Allergic/Immunologic: no itchy/runny eyes, nasal congestion, recent allergic reactions, rashes  PHYSICAL EXAM: Vitals:   05/13/18 1540  BP: 116/64  Pulse: (!) 59  SpO2: 99%   General: No acute distress,in good spirits today Head:  Normocephalic/atraumatic Skin/Extremities: no rash Neurological Exam: alert and oriented to person, place, and  time. No aphasia or dysarthria. Fund  of knowledge is appropriate.  Recent and remote memory are intact.  Attention and concentration are normal.    Able to name objects and repeat phrases. Cranial nerves: Pupils equal, round. No facial asymmetry. Motor: Bulk and tone normal, 5/5 throughout, no pronator drift. Sensation intact to light touch. No incoordination on finger to nose testing. Gait narrow-based and steady.  VNS Therapy Management: Parameters Output Current (mA): 0.5 Signal Frequency (Hz): 20 Pulse Width (usec): 250 Signal ON Time (sec): 30 Signal OFF Time (min): 5 Magnet Output Current (mA): 0.75 Magnet ON Time (sec): 60 Magnet Pulse Width (usec): 500 AutoStim Output Current (mA): 0.625 AutoStim Pulse Width (usec): 250 AutoStim ON Time (sec): 30 Tachycardia Detection : On Heartbeat Detection Sensitivity: 1 Perform Verify Heartbeat Detection: yes Threshold for AutoStim (%): 30  IMPRESSION: This is a very pleasant 73 yo RH man with a history of focal epilepsy of right mesial temporal or right mesial frontal onset. Repeat MRI brain showed right hippocampal asymmetry suggestive of mesial temporal sclerosis. PET scan showed right mesial temporal hypometabolism. EEGs in the past showed right temporal spikes and TIRDA, EMU admission captured seizures that appeared to localize to the right temporal area although clinically suggestive of frontal onset. He is s/p VNS placement and presents today for further adjustment, more than 3 parameters adjusted today, output current increased to 0.5, threshold for autostim decreased to 30%. Changes were explained to patient. We also discussed further slowly reducing Onfi to 10mg  qhs. Mood is much better. Last seizure was 04/16/18. Continue Zonisamide 300mg  qhs. He is aware of [place] driving laws to stop driving after a seizure until 6 months seizure-free. He will follow-up in 1 month for further VNS setting adjustment, they know to call for any changes.    Thank you for allowing me to participate in his care.  Please do not hesitate to call for any questions or concerns.  The duration of this appointment visit was 26 minutes of face-to-face time with the patient.  Greater than 50% of this time was spent in counseling, explanation of diagnosis, planning of further management, and coordination of care.   Ellouise Newer, M.D.   CC: Dr. Dagmar Hait

## 2018-06-17 ENCOUNTER — Other Ambulatory Visit: Payer: Self-pay

## 2018-06-17 ENCOUNTER — Ambulatory Visit: Payer: Medicare Other | Admitting: Neurology

## 2018-06-17 ENCOUNTER — Encounter: Payer: Self-pay | Admitting: Neurology

## 2018-06-17 VITALS — BP 88/60 | HR 72 | Ht 68.0 in | Wt 141.0 lb

## 2018-06-17 DIAGNOSIS — G40219 Localization-related (focal) (partial) symptomatic epilepsy and epileptic syndromes with complex partial seizures, intractable, without status epilepticus: Secondary | ICD-10-CM

## 2018-06-17 NOTE — Progress Notes (Signed)
NEUROLOGY FOLLOW UP OFFICE NOTE  John Holt [id number] 1946/04/04  HISTORY OF PRESENT ILLNESS: I had the pleasure of seeing John Holt in follow-up in the neurology clinic on 06/17/2018.  The patient was last seen a month ago for intractable epilepsy arising from the right mesial temporal or mesial frontal region.  He is again accompanied by his wife who helps supplement the history today. He has been anxious to continue reduction in [place], dose reduced to 10mg  qhs on his last visit. His wife contacted our office to report an increase in seizures, she came home at 4pm on 1/21 to find him in his pajamas, confused not knowing what day or time it was. He cleared up in 30 minutes. On 1/28, he had a TURBt under general anesthesia where bladder cancer was seen, he stopped post-op medications 2/1. He had a foley catheter for 3 days that was very painful, then on 2/7 he had 2 seizures, one in sleep and another when waking. On 2/9, he could not find the bathroom but was responsive. His wife used the magnet each time. He had 2 smaller ones on 2/10 and 2/11, he was advised to increase Onfi dose again but only took 15mg  one time and has not had any seizures in the past 9-10 days on Onfi 10mg  qhs and Zonisamide 300mg  qhs. He is in good spirits today and denies any headaches, dizziness, vision changes, focal numbness/tingling/weakness, no falls.   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 He had been seeing epileptologist Dr. Jacelyn Holt for several years, records were reviewed. He reports staring episodes in childhood. In his 38s, he recalls having episodes of a sinking feeling in his stomach with deja vu occurring around once a month initially. When he was started on seizure medications, these episodes occur rarely, around 1-2 a year. His wife started noticing possible symptoms in his 14s, they were in a meeting and she  noticed him swaying back and forth but seemed unaware of it. He started having nocturnal spells more than 15 years ago, where he would have "leg thrusting" (bicycling type leg movements), lip smacking, often followed by arousal with disorientation, need to urinate and walking around (at times resembling sleepwalking). He has had episodes where he would have a cluster of seizures then become paranoid and manic with significant post-ictal psychosis. He usually has nocturnal seizures every 3 weeks or so. His wife would give him prn clonazepam when he has clusters, and this has helped "zap" them. He has not needed clonazepam in at least 1.5 years. He has rare daytime seizures, his wife recalls the seizures in 2006 and 2013 with post-ictal psychosis, there is note of a spell of undressing and confusion in the [employer] at [place] in 2015. In the past he has been on Lamictal, Depakote, Gabapentin. Gabapentin has been tapered off. He has been taking Onfi for several years and feels this has been the most helpful. He was recently diagnosed with bladder cancer in October 2018 and underwent resection and chemotherapy. They report that towards the end of chemo, he had some incidents with seizures. He has no recollection of the events. His wife reports that after he had his second infusion and the PICC line was taken out, he started having slight feet movements and was staring and unresponsive. He had another episode while shoveling snow, he came in and started talking  and his speech became garbled, "words were not right" for 60 seconds. Three days after on the way home from chemo, he had a similar episode with his speech. He was talking about Aristotle then his speech became garbled. The last daytime event was on 04/28/17. He started swaying side to side, speech was garbled, and he was staring off for a few minutes. Due to these daytime episodes, his Onfi dose was temporarily increased to 40mg  qhs. They report he has not had  any nocturnal seizures since November, which is unusual for him. The higher dose of Onfi knocks him out. They report he is done with chemotherapy and will be seeing his oncologist to discuss next steps.  He denies any headaches, dizziness, diplopia, dysarthria/dysphagia, neck/back pain, focal numbness/tingling/weakness, bowel/bladder dysfunction, no falls. He had muscle cramps, taking magnesium supplements has helped. His memory is what bothers him a lot. They mostly notice problems with his "narrative memory." He would not recall watching a movie 2-3 weeks prior. He cannot recall anecdotes. He would make notes on an article, and later find that he had already made notes on the same article previously. He is able to remember abstract concepts from dense philosophy books he reads. His spatial memory is not good. He denies missing medications. Every once in a while he forgets his medications (twice in the past month). He drives only to the grocery and denies getting lost. He has had Neuropsychological testing, report unavailable for review.  Update 12/23/2017: On his last visit, he reported feeling bad, more depressed about his memory, and felt Onfi was contributing a lot to his symptoms. He had a seizure on 09/01/17 during wakefulness. He had a repeat MRI brain on 10/14/17 which showed asymmetric right hippocampal volume loss suggestive of mesial temporal sclerosis. He was started on Zonisamide and did very well with no seizures for 9 weeks (baseline nocturnal seizures every 2 weeks or so). He reduced Onfi to 20mg  and his wife reported he was doing great, his sense of humor was back, he was laughing, at one point his wife thought the euphoria was almost too much. She noticed this as a big difference from how he was the past couple of years where he had a "flattening" of mood. Unfortunately, after he reduced the Onfi to 10mg  on 12/02/17, his wife noticed almost immediately that his mood was a little darker and he was  more irritable. He had a nocturnal seizure on 8/9, then another on 8/14. With each seizure, his mood was going "way, way down." He was instructed in increase Zonisamide to 400mg  qhs. He then had a seizure during the afternoon of 12/18/17. He is tearful in the office today and has some difficulty finding his words, reporting it was the end of a stressful week, he had been having feelings of anxiety that week. He had no warning and is amnestic of the seizure, his wife reports he clasped his hands together in front of him and was doing repetitive digging movements, unresponsive. He then smiled at her and grabbed her arms, jerking her around like he was dancing with her. This lasted 2-3 minutes, then he started answering questions. No focal weakness, tongue bite, or incontinence. He called our office and was instructed to increase Onfi back to 20mg  daily and Zonisamide down to 300mg  qhs. He has felt normal this week. He has had tingling in his feet with the Zonisamide, this has quieted down but not completely gone away. He denies any headaches, dizziness, vision  changes, focal weakness, no falls.   Prior AEDs: Depakote, Lamictal, Tegretol, Trileptal, Keppra, Vimpat, Gabapentin  Epilepsy Risk Factors: His father had rare spells 1-2 times a year of "zonking out." He and his brother had "spasms" as babies, none after age 30/3. He recalls having a head injury in his teenage years. Otherwise he had a normal birth and early development, no history of febrile convulsions, CNS infections, or neurosurgical procedures.   Prior workup: MRI brain 01/2014: subtle findings consistent with right mesial temporal sclerosis EEGs: EEG 2009 - right temporal spike activity and right TIRDA EEG 01/2009 - normal EEG 07/2012 - normal  MRI 2006 - normal  PET Brain 2016 - right mesial temporal hypometabolism.  EMU 04/25/2014 - 05/04/2014 6 events captured on camera, 1 off camera - repeated neck extension and flexion, followed by  pelvic thrusting and body rocking movements. Appear to localize to the right temporal region, although clinically suggestive of frontal onset. Patient taken off Depakote and Lamictal and switched to high dose gabapentin  Neuropsychological testing in June 2019 indicated mild neurocognitive disorder due to seizure disorder. Testing and daily functioning not consistent with dementia. Testing revealed mostly normal cognitive functioning. However, he did demonstrate mildly reduced visual-spatial construction and more impaired visual memory, consistent with right hemisphere involvement and in particular mesial temporal lobe involvement. He also demonstrated impairment in verbal memory of non-contextual information, but memory of contextual information was intact, suggesting more of a problem with frontal-subcortical networks than hippocampal consolidation dysfunction.  PAST MEDICAL HISTORY: Past Medical History:  Diagnosis Date  . Anxiety disorder   . Bipolar 1 disorder, mixed ([place])    w/ hx psychosis/ dissociative reactions/  suicide ideations and attempt  . BPH (benign prostatic hypertrophy) with urinary obstruction   . Cancer [facility])    Bladder Cancer 2018  . Depression   . Frequency of urination   . History of adenomatous polyp of colon   . History of closed head injury    2000-- fell off ladder--  temporary memory loss resolved  . History of panic attacks   . History of psychosis    x2  last one documented 2006  w/ hallucination  and suicide ideation  . Nocturia   . Seizures ([place])   . Sigmoid diverticulosis   . Temporal lobe epilepsy syndrome [facility]) neurologist-  dr Leta Baptist--  Linden Dolin one every 2 weeks "legs jerking around, per wife , pt unaware he's doing this"   localization-related right temporal lobe --  mostly nocturnal w/ bilateral lower extremitity movement, staring and moaning then dissorietation afterwards (last daytime seizure June 2013)  . Urge urinary incontinence   .  Vesicoureteral reflux, bilateral   . Wears glasses     MEDICATIONS: Current Outpatient Medications on File Prior to Visit  Medication Sig Dispense Refill  . acetaminophen (TYLENOL) 325 MG tablet Take by mouth.    [name] b complex vitamins tablet Take 1 tablet by mouth daily.    . cloBAZam (ONFI) 10 MG tablet Take 1.5 tablets daily 45 tablet 5  . clonazePAM (KLONOPIN) 0.5 MG tablet Take 0.5 mg by mouth daily as needed (epilepsy).     [name] HYDROcodone-acetaminophen (NORCO/VICODIN) 5-325 MG tablet Take 1 tablet by mouth every 6 (six) hours as needed for moderate pain. 20 tablet 0  . Magnesium 500 MG TABS Take 500 mg by mouth daily.     . multivitamin-lutein (OCUVITE-LUTEIN) CAPS capsule Take 1 capsule by mouth daily.    . polyethylene glycol (MIRALAX / GLYCOLAX)  packet Take by mouth.    . tadalafil (CIALIS) 5 MG tablet Take 20 mg by mouth daily as needed for erectile dysfunction.     [name] zonisamide (ZONEGRAN) 100 MG capsule Take 3 caps every night 90 capsule 11   No current facility-administered medications on file prior to visit.     ALLERGIES: Allergies  Allergen Reactions  . Chlorhexidine Rash    Full body    FAMILY HISTORY: Family History  Problem Relation Age of Onset  . Seizures Father   . Heart disease Father   . Cancer Father        prostat  . Cancer Mother        stomach  . Stomach cancer Mother   . Colon cancer Neg Hx   . Colon polyps Neg Hx   . Esophageal cancer Neg Hx   . Rectal cancer Neg Hx     SOCIAL HISTORY: Social History   Socioeconomic History  . Marital status: Married    Spouse name: [name]  . Number of children: 2  . Years of education: PhD  . 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 Financial resource strain: Not on file  . Food insecurity:    Worry: Not on file    Inability: Not on file  . Transportation needs:    Medical: Not on file     Non-medical: Not on file  Tobacco Use  . Smoking status: Former Smoker    Packs/day: 1.00    Years: 15.00    Pack years: 15.00    Types: Cigarettes    Last attempt to quit: 04/03/1972    Years since quitting: 46.2  . Smokeless tobacco: Never Used  Substance and Sexual Activity  . Alcohol use: Yes    Alcohol/week: 14.0 standard drinks    Types: 14 Glasses of wine per week    Comment: 2 wine daily  . Drug use: No  . Sexual activity: Not on file  Lifestyle  . Physical activity:    Days per week: Not on file    Minutes per session: Not on file  . Stress: Not on file  Relationships  . Social connections:    Talks on phone: Not on file    Gets together: Not on file    Attends religious service: Not on file    Active member of club or organization: Not on file    Attends meetings of clubs or organizations: Not on file    Relationship status: Not on file  . Intimate partner violence:    Fear of current or ex partner: Not on file    Emotionally abused: Not on file    Physically abused: Not on file    Forced sexual activity: Not on file  Other Topics Concern  . Not on file  Social History Narrative      Pt lives at home with his spouse.   Caffeine Use- 2 cups daily    REVIEW OF SYSTEMS: Constitutional: No fevers, chills, or sweats, no generalized fatigue, change in appetite Eyes: No visual changes, double vision, eye pain Ear, nose and throat: No hearing loss, ear pain, nasal congestion, sore throat Cardiovascular: No chest pain, palpitations Respiratory:  No shortness of breath at rest or with exertion, wheezes GastrointestinaI: No nausea, vomiting, diarrhea, abdominal pain, fecal incontinence Genitourinary:  No dysuria, urinary retention or frequency Musculoskeletal:  No  neck pain, back pain Integumentary: No rash, pruritus, skin lesions Neurological: as above Psychiatric: + depression, anxiety Endocrine: No palpitations, fatigue, diaphoresis, mood swings, change in  appetite, change in weight, increased thirst Hematologic/Lymphatic:  No anemia, purpura, petechiae. Allergic/Immunologic: no itchy/runny eyes, nasal congestion, recent allergic reactions, rashes  PHYSICAL EXAM: Vitals:   06/17/18 0934  BP: (!) 88/60  Pulse: 72  SpO2: 100%   General: No acute distress,in good spirits today Head:  Normocephalic/atraumatic Skin/Extremities: no rash Neurological Exam: alert and oriented to person, place, and time. No aphasia or dysarthria. Fund of knowledge is appropriate.  Recent and remote memory are intact.  Attention and concentration are normal.    Able to name objects and repeat phrases. Cranial nerves: Pupils equal, round. No facial asymmetry. Motor: Bulk and tone normal, 5/5 throughout, no pronator drift. Sensation intact to light touch. No incoordination on finger to nose testing. Gait narrow-based and steady.  VNS Therapy Management: Parameters Output Current (mA): 0.75 Signal Frequency (Hz): 20 Pulse Width (usec): 250 Signal ON Time (sec): 30 Signal OFF Time (min): 5 Magnet Output Current (mA): 1 Magnet ON Time (sec): 60 Magnet Pulse Width (usec): 500 AutoStim Output Current (mA): 0.875 AutoStim Pulse Width (usec): 250 AutoStim ON Time (sec): 60 Tachycardia Detection : On Heartbeat Detection Sensitivity: 1 Perform Verify Heartbeat Detection: yes Threshold for AutoStim (%): 30  IMPRESSION: This is a very pleasant 73 yo RH man with a history of focal epilepsy of right mesial temporal or right mesial frontal onset. Repeat MRI brain showed right hippocampal asymmetry suggestive of mesial temporal sclerosis. PET scan showed right mesial temporal hypometabolism. EEGs in the past showed right temporal spikes and TIRDA, EMU admission captured seizures that appeared to localize to the right temporal area although clinically suggestive of frontal onset. He continues on regular monthly follow-up as we continue to adjust his VNS, 3 parameters adjusted  today, output current increased to 0.75 which he tolerated in the office. He continues to desire weaning down Onfi, we discussed seeing how he does over the next week, and if no further seizures, try alternating between 5mg  and 10mg  qhs. Continue Zonisamide 300mg  qhs. He is aware of [place] driving laws to stop driving after a seizure until 6 months seizure-free. He will follow-up in 1 month for further VNS setting adjustment, they know to call for any changes.   Thank you for allowing me to participate in his care.  Please do not hesitate to call for any questions or concerns.  The duration of this appointment visit was 26 minutes of face-to-face time with the patient.  Greater than 50% of this time was spent in counseling, explanation of diagnosis, planning of further management, and coordination of care.   John Holt, M.D.   CC: Dr. Dagmar Hait

## 2018-06-17 NOTE — Patient Instructions (Addendum)
1. Wait for one more week on Onfi 10mg  daily, then we can try reducing to alternating dose of 5mg  and 10mg  at night  2. Continue Zonisamide 300mg  every night  3. Follow-up in 1 month, call for any changes  Seizure Precautions: 1. If medication has been prescribed for you to prevent seizures, take it exactly as directed.  Do not stop taking the medicine without talking to your doctor first, even if you have not had a seizure in a long time.   2. Avoid activities in which a seizure would cause danger to yourself or to others.  Don't operate dangerous machinery, swim alone, or climb in high or dangerous places, such as on ladders, roofs, or girders.  Do not drive unless your doctor says you may.  3. If you have any warning that you may have a seizure, lay down in a safe place where you can't hurt yourself.    4.  No driving for 6 months from last seizure, as per [facility].   Please refer to the following link on the [employer] website for more information: http://[email or web address].org/answerplace/Social/driving/drivingu.cfm   5.  Maintain good sleep hygiene. Avoid alcohol.  6.  Contact your doctor if you have any problems that may be related to the medicine you are taking.  7.  Call 911 and bring the patient back to the ED if:        A.  The seizure lasts longer than 5 minutes.       B.  The patient doesn't awaken shortly after the seizure  C.  The patient has new problems such as difficulty seeing, speaking or moving  D.  The patient was injured during the seizure  E.  The patient has a temperature over 102 F (39C)  F.  The patient vomited and now is having trouble breathing

## 2018-07-16 ENCOUNTER — Encounter: Payer: Self-pay | Admitting: Neurology

## 2018-07-16 ENCOUNTER — Ambulatory Visit: Payer: Medicare Other | Admitting: Neurology

## 2018-07-16 ENCOUNTER — Other Ambulatory Visit: Payer: Self-pay

## 2018-07-16 VITALS — BP 98/60 | HR 76 | Temp 98.6°F | Ht 68.0 in | Wt 142.0 lb

## 2018-07-16 DIAGNOSIS — G40219 Localization-related (focal) (partial) symptomatic epilepsy and epileptic syndromes with complex partial seizures, intractable, without status epilepticus: Secondary | ICD-10-CM | POA: Diagnosis not present

## 2018-07-16 MED ORDER — CLOBAZAM 10 MG PO TABS: ORAL_TABLET | ORAL | 3 refills | Status: DC

## 2018-07-16 MED ORDER — ZONISAMIDE 100 MG PO CAPS: ORAL_CAPSULE | ORAL | 3 refills | Status: DC

## 2018-07-16 MED ORDER — CLONAZEPAM 0.5 MG PO TABS: 0.5000 mg | ORAL_TABLET | Freq: Every day | ORAL | 3 refills | Status: DC | PRN

## 2018-07-16 NOTE — Patient Instructions (Signed)
1. Stay on the Onfi alternating 10mg /5mg  for another 22 days, then let's try reducing to 5mg  every night  2. Continue Zonisamide 300mg  every night  3. Follow-up in 2 months, call for any changes  Seizure Precautions: 1. If medication has been prescribed for you to prevent seizures, take it exactly as directed.  Do not stop taking the medicine without talking to your doctor first, even if you have not had a seizure in a long time.   2. Avoid activities in which a seizure would cause danger to yourself or to others.  Don't operate dangerous machinery, swim alone, or climb in high or dangerous places, such as on ladders, roofs, or girders.  Do not drive unless your doctor says you may.  3. If you have any warning that you may have a seizure, lay down in a safe place where you can't hurt yourself.    4.  No driving for 6 months from last seizure, as per [facility]   Please refer to the following link on the [place] website for more information: http://www.epilepsyfoundation.org/answerplace/Social/driving/drivingu.cfm   5.  Maintain good sleep hygiene. Avoid alcohol.  6.  Contact your doctor if you have any problems that may be related to the medicine you are taking.  7.  Call 911 and bring the patient back to the ED if:        A.  The seizure lasts longer than 5 minutes.       B.  The patient doesn't awaken shortly after the seizure  C.  The patient has new problems such as difficulty seeing, speaking or moving  D.  The patient was injured during the seizure  E.  The patient has a temperature over 102 F (39C)  F.  The patient vomited and now is having trouble breathing

## 2018-07-16 NOTE — Progress Notes (Signed)
NEUROLOGY FOLLOW UP OFFICE NOTE  LJ MIYAMOTO [id number] Mar 22, 1946  HISTORY OF PRESENT ILLNESS: I had the pleasure of seeing Zakary Kimura in follow-up in the neurology clinic on 07/16/2018.  The patient was last seen a month ago for intractable epilepsy arising from the right mesial temporal or mesial frontal region.  He is again accompanied by his wife who helps supplement the history today. On his last visit, his wife reported an increase in seizures with reduction in [place] and around the time of bladder surgery. We slowed down on weaning schedule, he continued on Onfi 10mg  qhs until after his first infusion treatment, then reduced dose to alternating 10mg /5mg  on 3/12. His wife reports 2 seizures on 2/13 which were very mild, she felt swiping the magnet helped each time. His wife feels that the past week he has been more sluggish and "glacier-like," he does not feel this way. He was also having a bad headache, which he denies today. He denies any dizziness, diplopia, focal numbness/tingling/weakness, no falls. He does appear less upbeat today but able to answer all questions and follow commands appropriately.  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 He had been seeing epileptologist Dr. Jacelyn Grip for several years, records were reviewed. He reports staring episodes in childhood. In his 17s, he recalls having episodes of a sinking feeling in his stomach with deja vu occurring around once a month initially. When he was started on seizure medications, these episodes occur rarely, around 1-2 a year. His wife started noticing possible symptoms in his 37s, they were in a meeting and she noticed him swaying back and forth but seemed unaware of it. He started having nocturnal spells more than 15 years ago, where he would have "leg thrusting" (bicycling type leg movements), lip smacking, often followed by arousal  with disorientation, need to urinate and walking around (at times resembling sleepwalking). He has had episodes where he would have a cluster of seizures then become paranoid and manic with significant post-ictal psychosis. He usually has nocturnal seizures every 3 weeks or so. His wife would give him prn clonazepam when he has clusters, and this has helped "zap" them. He has not needed clonazepam in at least 1.5 years. He has rare daytime seizures, his wife recalls the seizures in 2006 and 2013 with post-ictal psychosis, there is note of a spell of undressing and confusion in the [employer] at [place] in 2015. In the past he has been on Lamictal, Depakote, Gabapentin. Gabapentin has been tapered off. He has been taking Onfi for several years and feels this has been the most helpful. He was recently diagnosed with bladder cancer in October 2018 and underwent resection and chemotherapy. They report that towards the end of chemo, he had some incidents with seizures. He has no recollection of the events. His wife reports that after he had his second infusion and the PICC line was taken out, he started having slight feet movements and was staring and unresponsive. He had another episode while shoveling snow, he came in and started talking and his speech became garbled, "words were not right" for 60 seconds. Three days after on the way home from chemo, he had a similar episode with his speech. He was talking about Aristotle then his speech became garbled. The last daytime event was on 04/28/17. He started swaying side to side, speech was garbled, and he  was staring off for a few minutes. Due to these daytime episodes, his Onfi dose was temporarily increased to 40mg  qhs. They report he has not had any nocturnal seizures since November, which is unusual for him. The higher dose of Onfi knocks him out. They report he is done with chemotherapy and will be seeing his oncologist to discuss next steps.  He denies any  headaches, dizziness, diplopia, dysarthria/dysphagia, neck/back pain, focal numbness/tingling/weakness, bowel/bladder dysfunction, no falls. He had muscle cramps, taking magnesium supplements has helped. His memory is what bothers him a lot. They mostly notice problems with his "narrative memory." He would not recall watching a movie 2-3 weeks prior. He cannot recall anecdotes. He would make notes on an article, and later find that he had already made notes on the same article previously. He is able to remember abstract concepts from dense philosophy books he reads. His spatial memory is not good. He denies missing medications. Every once in a while he forgets his medications (twice in the past month). He drives only to the grocery and denies getting lost. He has had Neuropsychological testing, report unavailable for review.  Update 12/23/2017: On his last visit, he reported feeling bad, more depressed about his memory, and felt Onfi was contributing a lot to his symptoms. He had a seizure on 09/01/17 during wakefulness. He had a repeat MRI brain on 10/14/17 which showed asymmetric right hippocampal volume loss suggestive of mesial temporal sclerosis. He was started on Zonisamide and did very well with no seizures for 9 weeks (baseline nocturnal seizures every 2 weeks or so). He reduced Onfi to 20mg  and his wife reported he was doing great, his sense of humor was back, he was laughing, at one point his wife thought the euphoria was almost too much. She noticed this as a big difference from how he was the past couple of years where he had a "flattening" of mood. Unfortunately, after he reduced the Onfi to 10mg  on 12/02/17, his wife noticed almost immediately that his mood was a little darker and he was more irritable. He had a nocturnal seizure on 8/9, then another on 8/14. With each seizure, his mood was going "way, way down." He was instructed in increase Zonisamide to 400mg  qhs. He then had a seizure during the  afternoon of 12/18/17. He is tearful in the office today and has some difficulty finding his words, reporting it was the end of a stressful week, he had been having feelings of anxiety that week. He had no warning and is amnestic of the seizure, his wife reports he clasped his hands together in front of him and was doing repetitive digging movements, unresponsive. He then smiled at her and grabbed her arms, jerking her around like he was dancing with her. This lasted 2-3 minutes, then he started answering questions. No focal weakness, tongue bite, or incontinence. He called our office and was instructed to increase Onfi back to 20mg  daily and Zonisamide down to 300mg  qhs. He has felt normal this week. He has had tingling in his feet with the Zonisamide, this has quieted down but not completely gone away. He denies any headaches, dizziness, vision changes, focal weakness, no falls.   Prior AEDs: Depakote, Lamictal, Tegretol, Trileptal, Keppra, Vimpat, Gabapentin  Epilepsy Risk Factors: His father had rare spells 1-2 times a year of "zonking out." He and his brother had "spasms" as babies, none after age 17/3. He recalls having a head injury in his teenage years. Otherwise he had  a normal birth and early development, no history of febrile convulsions, CNS infections, or neurosurgical procedures.   Prior workup: MRI brain 01/2014: subtle findings consistent with right mesial temporal sclerosis EEGs: EEG 2009 - right temporal spike activity and right TIRDA EEG 01/2009 - normal EEG 07/2012 - normal  MRI 2006 - normal  PET Brain 2016 - right mesial temporal hypometabolism.  EMU 04/25/2014 - 05/04/2014 6 events captured on camera, 1 off camera - repeated neck extension and flexion, followed by pelvic thrusting and body rocking movements. Appear to localize to the right temporal region, although clinically suggestive of frontal onset. Patient taken off Depakote and Lamictal and switched to high dose  gabapentin  Neuropsychological testing in June 2019 indicated mild neurocognitive disorder due to seizure disorder. Testing and daily functioning not consistent with dementia. Testing revealed mostly normal cognitive functioning. However, he did demonstrate mildly reduced visual-spatial construction and more impaired visual memory, consistent with right hemisphere involvement and in particular mesial temporal lobe involvement. He also demonstrated impairment in verbal memory of non-contextual information, but memory of contextual information was intact, suggesting more of a problem with frontal-subcortical networks than hippocampal consolidation dysfunction.  PAST MEDICAL HISTORY: Past Medical History:  Diagnosis Date   Anxiety disorder    Bipolar 1 disorder, mixed ([place])    w/ hx psychosis/ dissociative reactions/  suicide ideations and attempt   BPH (benign prostatic hypertrophy) with urinary obstruction    Cancer ([place])    Bladder Cancer 2018   Depression    Frequency of urination    History of adenomatous polyp of colon    History of closed head injury    2000-- fell off ladder--  temporary memory loss resolved   History of panic attacks    History of psychosis    x2  last one documented 2006  w/ hallucination  and suicide ideation   Nocturia    Seizures ([place])    Sigmoid diverticulosis    Temporal lobe epilepsy syndrome [facility]) neurologist-  dr Leta Baptist--  avergae one every 2 weeks "legs jerking around, per wife , pt unaware he's doing this"   localization-related right temporal lobe --  mostly nocturnal w/ bilateral lower extremitity movement, staring and moaning then dissorietation afterwards (last daytime seizure June 2013)   Urge urinary incontinence    Vesicoureteral reflux, bilateral    Wears glasses     MEDICATIONS: Current Outpatient Medications on File Prior to Visit  Medication Sig Dispense Refill   acetaminophen (TYLENOL) 325 MG tablet Take by mouth.      b complex vitamins tablet Take 1 tablet by mouth daily.     cloBAZam (ONFI) 10 MG tablet Take 1.5 tablets daily 45 tablet 5   clonazePAM (KLONOPIN) 0.5 MG tablet Take 0.5 mg by mouth daily as needed (epilepsy).      HYDROcodone-acetaminophen (NORCO/VICODIN) 5-325 MG tablet Take 1 tablet by mouth every 6 (six) hours as needed for moderate pain. 20 tablet 0   Magnesium 500 MG TABS Take 500 mg by mouth daily.      multivitamin-lutein (OCUVITE-LUTEIN) CAPS capsule Take 1 capsule by mouth daily.     oxybutynin (DITROPAN-XL) 5 MG 24 hr tablet TAKE 2 TABLETS (10 MG TOTAL) BY MOUTH DAILY AS NEEDED FOR UP TO 7 DAYS (FOR BLADDER SPASMS).     polyethylene glycol (MIRALAX / GLYCOLAX) packet Take by mouth.     tadalafil (CIALIS) 5 MG tablet Take 20 mg by mouth daily as needed for erectile dysfunction.  traMADol (ULTRAM) 50 MG tablet Take 50 mg by mouth every 6 (six) hours as needed. for pain     zonisamide (ZONEGRAN) 100 MG capsule Take 3 caps every night 90 capsule 11   No current facility-administered medications on file prior to visit.     ALLERGIES: Allergies  Allergen Reactions   Chlorhexidine Rash    Full body    FAMILY HISTORY: Family History  Problem Relation Age of Onset   Seizures Father    Heart disease Father    Cancer Father        prostat   Cancer Mother        stomach   Stomach cancer Mother    Colon cancer Neg Hx    Colon polyps Neg Hx    Esophageal cancer Neg Hx    Rectal cancer Neg Hx     SOCIAL HISTORY: Social History   Socioeconomic History   Marital status: Married    Spouse name: [name]   Number of children: 2   Years of education: PhD   Highest education level: Not on file  Occupational History    Employer: [employer]    Comment: Professor, Games developer  Social Needs   Financial resource strain: Not on file   Food insecurity:    Worry: Not on file    Inability: Not on file   Transportation needs:    Medical:  Not on file    Non-medical: Not on file  Tobacco Use   Smoking status: Former Smoker    Packs/day: 1.00    Years: 15.00    Pack years: 15.00    Types: Cigarettes    Last attempt to quit: 04/03/1972    Years since quitting: 46.3   Smokeless tobacco: Never Used  Substance and Sexual Activity   Alcohol use: Yes    Alcohol/week: 14.0 standard drinks    Types: 14 Glasses of wine per week    Comment: 2 wine daily   Drug use: No   Sexual activity: Not on file  Lifestyle   Physical activity:    Days per week: Not on file    Minutes per session: Not on file   Stress: Not on file  Relationships   Social connections:    Talks on phone: Not on file    Gets together: Not on file    Attends religious service: Not on file    Active member of club or organization: Not on file    Attends meetings of clubs or organizations: Not on file    Relationship status: Not on file   Intimate partner violence:    Fear of current or ex partner: Not on file    Emotionally abused: Not on file    Physically abused: Not on file    Forced sexual activity: Not on file  Other Topics Concern   Not on file  Social History Narrative      Pt lives at home with his spouse.   Caffeine Use- 2 cups daily    REVIEW OF SYSTEMS: Constitutional: No fevers, chills, or sweats, no generalized fatigue, change in appetite Eyes: No visual changes, double vision, eye pain Ear, nose and throat: No hearing loss, ear pain, nasal congestion, sore throat Cardiovascular: No chest pain, palpitations Respiratory:  No shortness of breath at rest or with exertion, wheezes GastrointestinaI: No nausea, vomiting, diarrhea, abdominal pain, fecal incontinence Genitourinary:  No dysuria, urinary retention or frequency Musculoskeletal:  No neck pain, back pain Integumentary: No rash,  pruritus, skin lesions Neurological: as above Psychiatric: + depression, anxiety Endocrine: No palpitations, fatigue, diaphoresis, mood  swings, change in appetite, change in weight, increased thirst Hematologic/Lymphatic:  No anemia, purpura, petechiae. Allergic/Immunologic: no itchy/runny eyes, nasal congestion, recent allergic reactions, rashes  PHYSICAL EXAM: Vitals:   07/16/18 1100  BP: 98/60  Pulse: 76  Temp: 98.6 F (37 C)  SpO2: 98%   General: No acute distress, he is less upbeat today Head:  Normocephalic/atraumatic Skin/Extremities: no rash Neurological Exam: alert and oriented to person, place, and time. No aphasia or dysarthria. Fund of knowledge is appropriate.  Recent and remote memory are intact.  Attention and concentration are normal.    Able to name objects and repeat phrases. Cranial nerves: Pupils equal, round. No facial asymmetry. Motor: Bulk and tone normal, 5/5 throughout, no pronator drift. Sensation intact to light touch. No incoordination on finger to nose testing. Gait narrow-based and steady.  VNS Therapy Management: Parameters Output Current (mA): 1 Signal Frequency (Hz): 20 Pulse Width (usec): 250 Signal ON Time (sec): 30 Signal OFF Time (min): 5 Magnet Output Current (mA): 1.25 Magnet ON Time (sec): 60 Magnet Pulse Width (usec): 500 AutoStim Output Current (mA): 1.125 AutoStim Pulse Width (usec): 250 AutoStim ON Time (sec): 30 Tachycardia Detection : On Heartbeat Detection Sensitivity: 1 Perform Verify Heartbeat Detection: yes Threshold for AutoStim (%): 30  IMPRESSION: This is a very pleasant 73 yo RH man with a history of focal epilepsy of right mesial temporal or right mesial frontal onset. Repeat MRI brain showed right hippocampal asymmetry suggestive of mesial temporal sclerosis. PET scan showed right mesial temporal hypometabolism. EEGs in the past showed right temporal spikes and TIRDA, EMU admission captured seizures that appeared to localize to the right temporal area although clinically suggestive of frontal onset. Last seizure on 07/09/2018. He would strongly like to  continue weaning off [place], we discussed staying a month on alternating 10mg /5mg  dose, then reduce to 5mg  qhs. Continue Zonisamide 300mg  qhs. VNS interrogated with 3 parameters adjusted today, output current increased to 66mA which he tolerated in the office. He is aware of [place] driving laws to stop driving after a seizure until 6 months seizure-free. He will follow-up in 2 months for further VNS setting adjustment, they know to call for any changes.   Thank you for allowing me to participate in his care.  Please do not hesitate to call for any questions or concerns.  The duration of this appointment visit was 25 minutes of face-to-face time with the patient.  Greater than 50% of this time was spent in counseling, explanation of diagnosis, planning of further management, and coordination of care.   Ellouise Newer, M.D.   CC: Dr. Dagmar Hait

## 2018-07-29 ENCOUNTER — Other Ambulatory Visit: Payer: Self-pay | Admitting: Urology

## 2018-08-02 NOTE — Patient Instructions (Addendum)
John Holt    Your procedure is scheduled on: 08/13/2018  Report to [facility] Main  Entrance  Report to admitting at 9:30 am.    Call this number if you have problems the morning of surgery [phone]    Remember: Do not eat food or drink liquids :After Midnight . BRUSH YOUR TEETH MORNING OF SURGERY AND RINSE YOUR MOUTH OUT, NO CHEWING GUM CANDY OR MINTS.     Take these medicines the morning of surgery with A SIP OF WATER: Clonazepam(Klonopin) as needed.                                You may not have any metal on your body including              piercings  Do not wear jewelry, make-up, lotions, powders , deodorant                          Men may shave face and neck.   Do not bring valuables to the hospital. [place].    Leave suitcase in the car. After surgery it may be brought to your room.     Patients discharged the day of surgery will not be allowed to drive home. IF YOU ARE HAVING SURGERY AND GOING HOME THE SAME DAY, YOU MUST HAVE AN ADULT TO DRIVE YOU HOME AND BE WITH YOU FOR 24 HOURS. YOU MAY GO HOME BY TAXI OR UBER OR ORTHERWISE, BUT AN ADULT MUST ACCOMPANY YOU HOME AND STAY WITH YOU FOR 24 HOURS.   Name and phone number of your driver: laurie wife cell [phone]   Special Instructions: N/A              Please read over the following fact sheets you were given: _____________________________________________________________________   [facility] - Preparing for Surgery USE DIAL GOLD [place] Before surgery, you can play an important role.  Because skin is not sterile, your skin needs to be as free of germs as possible.  You can reduce the number of germs on your skin by washing with CHG (chlorahexidine gluconate) soap before surgery.  CHG is an antiseptic cleaner which kills germs and bonds with the skin to continue killing germs even after washing. Please DO NOT use if you  have an allergy to CHG or antibacterial soaps.  If your skin becomes reddened/irritated stop using the CHG and inform your nurse when you arrive at Short Stay. Do not shave (including legs and underarms) for at least 48 hours prior to the first CHG shower.  You may shave your face/neck. Please follow these instructions carefully:  1.  Shower with CHG Soap the night before surgery and the  morning of Surgery.  2.  If you choose to wash your hair, wash your hair first as usual with your  normal  shampoo.  3.  After you shampoo, rinse your hair and body thoroughly to remove the  shampoo.                           4.  Use CHG as you would any other liquid soap.  You  can apply chg directly  to the skin and wash                       Gently with a scrungie or clean washcloth.  5.  Apply the CHG Soap to your body ONLY FROM THE NECK DOWN.   Do not use on face/ open                           Wound or open sores. Avoid contact with eyes, ears mouth and genitals (private parts).                       Wash face,  Genitals (private parts) with your normal soap.             6.  Wash thoroughly, paying special attention to the area where your surgery  will be performed.  7.  Thoroughly rinse your body with warm water from the neck down.  8.  DO NOT shower/wash with your normal soap after using and rinsing off  the CHG Soap.                9.  Pat yourself dry with a clean towel.            10.  Wear clean pajamas.            11.  Place clean sheets on your bed the night of your first shower and do not  sleep with pets. Day of Surgery : Do not apply any lotions/deodorants the morning of surgery.  Please wear clean clothes to the hospital/surgery center.  FAILURE TO FOLLOW THESE INSTRUCTIONS MAY RESULT IN THE CANCELLATION OF YOUR SURGERY PATIENT SIGNATURE_________________________________  NURSE  SIGNATURE__________________________________  ________________________________________________________________________

## 2018-08-04 NOTE — Progress Notes (Signed)
SPOKE W/  _Pt      SCREENING SYMPTOMS OF COVID 19:   COUGH--no  RUNNY NOSE--- no  SORE THROAT---no  NASAL CONGESTION----no  SNEEZING---no-  SHORTNESS OF BREATH---no  DIFFICULTY BREATHING-no--  TEMP >100.4---no--  UNEXPLAINED BODY ACHES-----no-   HAVE YOU OR ANY FAMILY MEMBER TRAVELLED PAST 14 DAYS OUT OF THE   COUNTY---no STATE---no- COUNTRY----no  HAVE YOU OR ANY FAMILY MEMBER BEEN EXPOSED TO ANYONE WITH COVID 19?  no

## 2018-08-05 ENCOUNTER — Other Ambulatory Visit: Payer: Self-pay

## 2018-08-05 ENCOUNTER — Encounter (HOSPITAL_COMMUNITY)
Admission: RE | Admit: 2018-08-05 | Discharge: 2018-08-05 | Disposition: A | Discharge status: Home / Self Care | Payer: Medicare Other | Source: Ambulatory Visit | Attending: Urology | Admitting: Urology

## 2018-08-05 ENCOUNTER — Encounter (HOSPITAL_COMMUNITY): Payer: Self-pay

## 2018-08-05 ENCOUNTER — Inpatient Hospital Stay (HOSPITAL_COMMUNITY): Admission: RE | Admit: 2018-08-05 | Payer: Medicare Other | Source: Ambulatory Visit

## 2018-08-05 DIAGNOSIS — Z01818 Encounter for other preprocedural examination: Secondary | ICD-10-CM | POA: Diagnosis present

## 2018-08-05 DIAGNOSIS — I10 Essential (primary) hypertension: Secondary | ICD-10-CM | POA: Insufficient documentation

## 2018-08-05 DIAGNOSIS — R001 Bradycardia, unspecified: Secondary | ICD-10-CM | POA: Insufficient documentation

## 2018-08-05 HISTORY — DX: Adverse effect of unspecified anesthetic, initial encounter: T41.45XA

## 2018-08-05 HISTORY — DX: Neoplasm of unspecified behavior of bladder: D49.4

## 2018-08-05 LAB — BASIC METABOLIC PANEL
Anion gap: 6 (ref 5–15)
BUN: 24 mg/dL — ABNORMAL HIGH (ref 8–23)
CO2: 24 mmol/L (ref 22–32)
Calcium: 9 mg/dL (ref 8.9–10.3)
Chloride: 109 mmol/L (ref 98–111)
Creatinine, Ser: 0.89 mg/dL (ref 0.61–1.24)
GFR calc Af Amer: >60 mL/min (ref >60)
GFR calc non Af Amer: >60 mL/min (ref >60)
Glucose, Bld: 87 mg/dL (ref 70–99)
Potassium: 4.2 mmol/L (ref 3.5–5.1)
Sodium: 139 mmol/L (ref 135–145)

## 2018-08-05 LAB — CBC
HCT: 44.7 % (ref 39.0–52.0)
Hemoglobin: 14.4 g/dL (ref 13.0–17.0)
MCH: 30.4 pg (ref 26.0–34.0)
MCHC: 32.2 g/dL (ref 30.0–36.0)
MCV: 94.5 fL (ref 80.0–100.0)
Platelets: 188 10*3/uL (ref 150–400)
RBC: 4.73 MIL/uL (ref 4.22–5.81)
RDW: 14.4 % (ref 11.5–15.5)
WBC: 4.5 10*3/uL (ref 4.0–10.5)
nRBC: 0 % (ref 0.0–0.2)

## 2018-08-09 NOTE — Progress Notes (Signed)
Final EKG done 08/05/18-epic

## 2018-08-12 NOTE — Progress Notes (Signed)
Anesthesia Chart Review   Case:  [id number] Date/Time:  08/13/18 1115   Procedures:      TRANSURETHRAL RESECTION OF BLADDER TUMOR (TURBT) (N/A ) - 1 HR     CYSTOSCOPY WITH RETROGRADE PYELOGRAM (Bilateral )   Anesthesia type:  General   Pre-op diagnosis:  BLADDER TUMOR   Location:  [place] [facility] / [facility]   Surgeon:  Cleon Gustin, MD      DISCUSSION: 73 yo former smoker (15 pack years, quit 04/03/72) with h/o closed head injury (2000 after a fall off ladder), temporal lobe epilepsy syndrome (followed by neurologist, last seizure 06/10/18, VNS implanted with last interrogation 07/16/18), Bipolar 1 disorder (h/o psychosis with hallucination and suicidal ideation last documented 2006), bladder tumor scheduled for above procedure 08/13/27 with Dr. Nicolette Bang.   Pt reports severe pain after last bladder tumor resection, reports overnight stay due to poorly controlled pain.   Pt last seen by neurologist, Dr. Ellouise Newer, 07/16/18.    Pt can proceed with planned procedure barring acute status change.   VS: BP 124/71   Pulse 60   Temp 36.7 C (Oral)   Ht 5\' 8"  (1.727 m)   Wt 60.8 kg   SpO2 100%   BMI 20.37 kg/m   PROVIDERS: [name], MD is PCP   Ellouise Newer, MD is Neurologist  LABS: Labs reviewed: Acceptable for surgery. (all labs ordered are listed, but only abnormal results are displayed)  Labs Reviewed  BASIC METABOLIC PANEL - Abnormal; Notable for the following components:      Result Value   BUN 24 (*)    All other components within normal limits  CBC     IMAGES:   EKG: 08/05/18 Rate 56 bpm Sinus bradycardia Otherwise normal ECG  CV:  Past Medical History:  Diagnosis Date  . Bladder tumor   . BPH (benign prostatic hypertrophy) with urinary obstruction   . Cancer [facility])    Bladder Cancer 2018  . Complication of anesthesia    severe pain after last bladder tumor resection at [place] had to stay overnight due to pain  . Frequency of urination    . History of adenomatous polyp of colon   . History of closed head injury    2000-- fell off ladder--  temporary memory loss resolved  . History of psychosis    x2  last one documented 2006  w/ hallucination  and suicide ideation  . Nocturia   . Seizures ([place])    last noctural seizure 6 months ago as of 08-05-2018, no day time seizures in years seizure  . Sigmoid diverticulosis   . Temporal lobe epilepsy syndrome [facility]) neurologist-  dr Leta Baptist--  Linden Dolin one every 2 weeks "legs jerking around, per wife , pt unaware he's doing this"   localization-related right temporal lobe --  mostly nocturnal w/ bilateral lower extremitity movement, staring and moaning then dissorietation afterwards (last daytime seizure June 2013)  . Urge urinary incontinence    resolved  . Vesicoureteral reflux, bilateral   . Wears glasses     Past Surgical History:  Procedure Laterality Date  . COLONOSCOPY    . COLONOSCOPY W/ POLYPECTOMY  09-11-2009  . GREEN LIGHT LASER TURP (TRANSURETHRAL RESECTION OF PROSTATE N/A 04/07/2014   Procedure: GREEN LIGHT LASER TURP (TRANSURETHRAL RESECTION OF PROSTATE;  Surgeon: Ailene Rud, MD;  Location: [facility];  Service: Urology;  Laterality: N/A;  . INGUINAL HERNIA REPAIR Bilateral 04/15/2013   Procedure: LAPAROSCOPIC BILATERAL  INGUINAL HERNIA REPAIR;  Surgeon: Gayland Curry, MD;  [facility];  Service: General;  Laterality: Bilateral;  . INSERTION OF MESH N/A 04/15/2013   Procedure: INSERTION OF MESH;  Surgeon: Gayland Curry, MD;  [facility];  Service: General;  Laterality: N/A;  . POLYPECTOMY    . TRANSURETHRAL RESECTION OF BLADDER  2018  . UMBILICAL HERNIA REPAIR N/A 04/15/2013   Procedure: OPEN HERNIA REPAIR UMBILICAL ADULT;  Surgeon: Gayland Curry, MD;  [facility];  Service: General;  Laterality: N/A;  . VAGUS NERVE STIMULATOR INSERTION Left 01/28/2018   Procedure: VAGAL NERVE STIMULATOR PLACEMENT;  Surgeon: Consuella Lose,  MD;  Location: [place];  Service: Neurosurgery;  Laterality: Left;  VAGAL NERVE STIMULATOR PLACEMENT    MEDICATIONS: . acetaminophen (TYLENOL) 325 MG tablet  . cloBAZam (ONFI) 10 MG tablet  . clonazePAM (KLONOPIN) 0.5 MG tablet  . HYDROcodone-acetaminophen (NORCO/VICODIN) 5-325 MG tablet  . Multiple Vitamins-Minerals (PRESERVISION AREDS 2+MULTI VIT) CAPS  . tadalafil (ADCIRCA/CIALIS) 20 MG tablet  . zonisamide (ZONEGRAN) 100 MG capsule   No current facility-administered medications for this encounter.     Maia Plan [facility] Pre-Surgical Testing [phone] 08/12/18 1:51 PM

## 2018-08-13 ENCOUNTER — Ambulatory Visit (HOSPITAL_COMMUNITY): Payer: Medicare Other | Admitting: Anesthesiology

## 2018-08-13 ENCOUNTER — Ambulatory Visit (HOSPITAL_COMMUNITY)
Admission: RE | Admit: 2018-08-13 | Discharge: 2018-08-13 | Disposition: A | Discharge status: Home / Self Care | Payer: Medicare Other | Attending: Urology | Admitting: Urology

## 2018-08-13 ENCOUNTER — Encounter (HOSPITAL_COMMUNITY)
Admission: RE | Disposition: A | Discharge status: Home / Self Care | Payer: Self-pay | Source: Home / Self Care | Attending: Urology

## 2018-08-13 ENCOUNTER — Ambulatory Visit (HOSPITAL_COMMUNITY): Payer: Medicare Other | Admitting: Physician Assistant

## 2018-08-13 ENCOUNTER — Other Ambulatory Visit: Payer: Self-pay

## 2018-08-13 ENCOUNTER — Encounter (HOSPITAL_COMMUNITY): Payer: Self-pay | Admitting: *Deleted

## 2018-08-13 ENCOUNTER — Ambulatory Visit (HOSPITAL_COMMUNITY): Payer: Medicare Other

## 2018-08-13 DIAGNOSIS — Z888 Allergy status to other drugs, medicaments and biological substances status: Secondary | ICD-10-CM | POA: Insufficient documentation

## 2018-08-13 DIAGNOSIS — Z87891 Personal history of nicotine dependence: Secondary | ICD-10-CM | POA: Insufficient documentation

## 2018-08-13 DIAGNOSIS — Z8551 Personal history of malignant neoplasm of bladder: Secondary | ICD-10-CM | POA: Diagnosis not present

## 2018-08-13 DIAGNOSIS — D090 Carcinoma in situ of bladder: Secondary | ICD-10-CM | POA: Diagnosis not present

## 2018-08-13 DIAGNOSIS — D494 Neoplasm of unspecified behavior of bladder: Secondary | ICD-10-CM | POA: Diagnosis present

## 2018-08-13 DIAGNOSIS — C670 Malignant neoplasm of trigone of bladder: Secondary | ICD-10-CM

## 2018-08-13 HISTORY — PX: CYSTOSCOPY W/ RETROGRADES: SHX1426

## 2018-08-13 HISTORY — PX: TRANSURETHRAL RESECTION OF BLADDER TUMOR: SHX2575

## 2018-08-13 SURGERY — TURBT (TRANSURETHRAL RESECTION OF BLADDER TUMOR)
Anesthesia: General

## 2018-08-13 MED ORDER — FENTANYL CITRATE (PF) 100 MCG/2ML IJ SOLN
INTRAMUSCULAR | Status: AC
  Filled 2018-08-13: qty 2

## 2018-08-13 MED ORDER — PHENAZOPYRIDINE HCL 100 MG PO TABS: 100.0000 mg | ORAL_TABLET | Freq: Three times a day (TID) | ORAL | 0 refills | Status: DC | PRN

## 2018-08-13 MED ORDER — EPHEDRINE SULFATE-NACL 50-0.9 MG/10ML-% IV SOSY
PREFILLED_SYRINGE | INTRAVENOUS | Status: DC | PRN
  Administered 2018-08-13 (×2): 10 mg via INTRAVENOUS

## 2018-08-13 MED ORDER — SUGAMMADEX SODIUM 200 MG/2ML IV SOLN
INTRAVENOUS | Status: DC | PRN
  Administered 2018-08-13: 200 mg via INTRAVENOUS

## 2018-08-13 MED ORDER — FENTANYL CITRATE (PF) 250 MCG/5ML IJ SOLN
INTRAMUSCULAR | Status: DC | PRN
  Administered 2018-08-13 (×2): 50 ug via INTRAVENOUS

## 2018-08-13 MED ORDER — HYDROCODONE-ACETAMINOPHEN 5-325 MG PO TABS: 1.0000 | ORAL_TABLET | ORAL | 0 refills | Status: DC | PRN

## 2018-08-13 MED ORDER — GLYCOPYRROLATE PF 0.2 MG/ML IJ SOSY
PREFILLED_SYRINGE | INTRAMUSCULAR | Status: DC | PRN
  Administered 2018-08-13: .1 mg via INTRAVENOUS

## 2018-08-13 MED ORDER — CEFAZOLIN SODIUM-DEXTROSE 2-4 GM/100ML-% IV SOLN
2.0000 g | INTRAVENOUS | Status: AC
  Administered 2018-08-13: 12:00:00 2 g via INTRAVENOUS
  Filled 2018-08-13: qty 100

## 2018-08-13 MED ORDER — ROCURONIUM BROMIDE 10 MG/ML (PF) SYRINGE
PREFILLED_SYRINGE | INTRAVENOUS | Status: DC | PRN
  Administered 2018-08-13: 20 mg via INTRAVENOUS

## 2018-08-13 MED ORDER — LIDOCAINE 2% (20 MG/ML) 5 ML SYRINGE
INTRAMUSCULAR | Status: DC | PRN
  Administered 2018-08-13: 80 mg via INTRAVENOUS

## 2018-08-13 MED ORDER — IOHEXOL 300 MG/ML  SOLN
INTRAMUSCULAR | Status: DC | PRN
  Administered 2018-08-13: 12:00:00 17 mL

## 2018-08-13 MED ORDER — SUCCINYLCHOLINE CHLORIDE 200 MG/10ML IV SOSY
PREFILLED_SYRINGE | INTRAVENOUS | Status: DC | PRN
  Administered 2018-08-13: 100 mg via INTRAVENOUS

## 2018-08-13 MED ORDER — DEXAMETHASONE SODIUM PHOSPHATE 10 MG/ML IJ SOLN
INTRAMUSCULAR | Status: AC
  Filled 2018-08-13: qty 1

## 2018-08-13 MED ORDER — FENTANYL CITRATE (PF) 100 MCG/2ML IJ SOLN
25.0000 ug | INTRAMUSCULAR | Status: DC | PRN
  Administered 2018-08-13 (×3): 25 ug via INTRAVENOUS

## 2018-08-13 MED ORDER — SODIUM CHLORIDE 0.9 % IR SOLN
Status: DC | PRN
  Administered 2018-08-13 (×2): 3000 mL via INTRAVESICAL

## 2018-08-13 MED ORDER — LIDOCAINE 2% (20 MG/ML) 5 ML SYRINGE
INTRAMUSCULAR | Status: AC
  Filled 2018-08-13: qty 5

## 2018-08-13 MED ORDER — STERILE WATER FOR IRRIGATION IR SOLN
Status: DC | PRN
  Administered 2018-08-13: 250 mL

## 2018-08-13 MED ORDER — OXYCODONE HCL 5 MG PO TABS: 5.0000 mg | ORAL_TABLET | Freq: Once | ORAL | Status: DC | PRN

## 2018-08-13 MED ORDER — OXYCODONE HCL 5 MG/5ML PO SOLN: 5.0000 mg | Freq: Once | ORAL | Status: DC | PRN

## 2018-08-13 MED ORDER — ONDANSETRON HCL 4 MG/2ML IJ SOLN
INTRAMUSCULAR | Status: DC | PRN
  Administered 2018-08-13: 4 mg via INTRAVENOUS

## 2018-08-13 MED ORDER — EPHEDRINE 5 MG/ML INJ
INTRAVENOUS | Status: AC
  Filled 2018-08-13: qty 10

## 2018-08-13 MED ORDER — PROPOFOL 10 MG/ML IV BOLUS
INTRAVENOUS | Status: DC | PRN
  Administered 2018-08-13 (×2): 20 mg via INTRAVENOUS
  Administered 2018-08-13: 100 mg via INTRAVENOUS
  Administered 2018-08-13 (×2): 20 mg via INTRAVENOUS

## 2018-08-13 MED ORDER — SUCCINYLCHOLINE CHLORIDE 200 MG/10ML IV SOSY
PREFILLED_SYRINGE | INTRAVENOUS | Status: AC
  Filled 2018-08-13: qty 10

## 2018-08-13 MED ORDER — ONDANSETRON HCL 4 MG/2ML IJ SOLN
INTRAMUSCULAR | Status: AC
  Filled 2018-08-13: qty 2

## 2018-08-13 MED ORDER — FENTANYL CITRATE (PF) 250 MCG/5ML IJ SOLN
INTRAMUSCULAR | Status: AC
  Filled 2018-08-13: qty 5

## 2018-08-13 MED ORDER — SUGAMMADEX SODIUM 200 MG/2ML IV SOLN
INTRAVENOUS | Status: AC
  Filled 2018-08-13: qty 2

## 2018-08-13 MED ORDER — LACTATED RINGERS IV SOLN
INTRAVENOUS | Status: DC
  Administered 2018-08-13: 10:00:00 via INTRAVENOUS

## 2018-08-13 MED ORDER — ONDANSETRON HCL 4 MG/2ML IJ SOLN: 4.0000 mg | Freq: Once | INTRAMUSCULAR | Status: DC | PRN

## 2018-08-13 MED ORDER — ROCURONIUM BROMIDE 10 MG/ML (PF) SYRINGE
PREFILLED_SYRINGE | INTRAVENOUS | Status: AC
  Filled 2018-08-13: qty 10

## 2018-08-13 MED ORDER — PROPOFOL 10 MG/ML IV BOLUS
INTRAVENOUS | Status: AC
  Filled 2018-08-13: qty 20

## 2018-08-13 SURGICAL SUPPLY — 30 items
BAG URINE DRAINAGE (UROLOGICAL SUPPLIES) IMPLANT
BAG URO CATCHER STRL LF (MISCELLANEOUS) ×4 IMPLANT
CATH FOLEY 3WAY 30CC 22FR (CATHETERS) ×4 IMPLANT
CATH INTERMIT  6FR 70CM (CATHETERS) ×4 IMPLANT
CLOTH BEACON ORANGE TIMEOUT ST (SAFETY) ×4 IMPLANT
COVER WAND RF STERILE (DRAPES) IMPLANT
ELECT REM PT RETURN 15FT ADLT (MISCELLANEOUS) ×4 IMPLANT
EVACUATOR MICROVAS BLADDER (UROLOGICAL SUPPLIES) IMPLANT
EXTRACTOR STONE NITINOL NGAGE (UROLOGICAL SUPPLIES) IMPLANT
FIBER LASER TRAC TIP (UROLOGICAL SUPPLIES) IMPLANT
GLOVE BIO SURGEON STRL SZ8 (GLOVE) ×4 IMPLANT
GLOVE BIOGEL M 8.0 STRL (GLOVE) ×4 IMPLANT
GOWN STRL REUS W/TWL LRG LVL3 (GOWN DISPOSABLE) ×4 IMPLANT
GOWN STRL REUS W/TWL XL LVL3 (GOWN DISPOSABLE) ×4 IMPLANT
GUIDEWIRE ANG ZIPWIRE 038X150 (WIRE) ×4 IMPLANT
GUIDEWIRE STR DUAL SENSOR (WIRE) ×4 IMPLANT
IV NS 1000ML (IV SOLUTION) ×2
IV NS 1000ML BAXH (IV SOLUTION) ×2 IMPLANT
KIT TURNOVER KIT A (KITS) IMPLANT
LOOP CUT BIPOLAR 24F LRG (ELECTROSURGICAL) IMPLANT
MANIFOLD NEPTUNE II (INSTRUMENTS) ×4 IMPLANT
PACK CYSTO (CUSTOM PROCEDURE TRAY) ×4 IMPLANT
SET ASPIRATION TUBING (TUBING) IMPLANT
SHEATH URETERAL 12FRX35CM (MISCELLANEOUS) IMPLANT
STENT URET 6FRX26 CONTOUR (STENTS) IMPLANT
SYRINGE IRR TOOMEY STRL 70CC (SYRINGE) IMPLANT
TUBE FEEDING 8FR 16IN STR KANG (MISCELLANEOUS) IMPLANT
TUBING CONNECTING 10 (TUBING) ×3 IMPLANT
TUBING CONNECTING 10' (TUBING) ×1
TUBING UROLOGY SET (TUBING) ×4 IMPLANT

## 2018-08-13 NOTE — Anesthesia Procedure Notes (Signed)
Procedure Name: Intubation Date/Time: 08/13/2018 11:52 AM Performed by: Mitzie Na, CRNA Pre-anesthesia Checklist: Patient identified, Emergency Drugs available, Suction available, Patient being monitored and Timeout performed Patient Re-evaluated:Patient Re-evaluated prior to induction Oxygen Delivery Method: Circle system utilized Preoxygenation: Pre-oxygenation with 100% oxygen Induction Type: IV induction and Rapid sequence Laryngoscope Size: Mac and 3 Grade View: Grade I Tube type: Oral Tube size: 7.5 mm Number of attempts: 1 Airway Equipment and Method: Stylet Placement Confirmation: ETT inserted through vocal cords under direct vision,  positive ETCO2 and breath sounds checked- equal and bilateral Secured at: 24 cm Tube secured with: Tape Dental Injury: Teeth and Oropharynx as per pre-operative assessment

## 2018-08-13 NOTE — Anesthesia Postprocedure Evaluation (Signed)
Anesthesia Post Note  Patient: John Holt  Procedure(s) Performed: TRANSURETHRAL RESECTION OF BLADDER TUMOR (TURBT) (N/A ) CYSTOSCOPY WITH RETROGRADE PYELOGRAM (Bilateral )     Patient location during evaluation: PACU Anesthesia Type: General Level of consciousness: awake and alert Pain management: pain level controlled Vital Signs Assessment: post-procedure vital signs reviewed and stable Respiratory status: spontaneous breathing, nonlabored ventilation and respiratory function stable Cardiovascular status: blood pressure returned to baseline and stable Postop Assessment: no apparent nausea or vomiting Anesthetic complications: no    Last Vitals:  Vitals:   08/13/18 1300 08/13/18 1315  BP: 124/66 122/73  Pulse: 63 67  Resp: 15 12  Temp:  37 C  SpO2: 100% 100%    Last Pain:  Vitals:   08/13/18 1315  TempSrc:   PainSc: [place]

## 2018-08-13 NOTE — H&P (Signed)
Urology Admission H&P  Chief Complaint: bladder tumor  History of Present Illness: John Holt is a 73yo with a history of T2/T3 bladder cancer who has developed a recurrance at his trigone. He denies any hematuria. No new luts. No other associated symptoms. No fevers/chills/sweats.  Past Medical History:  Diagnosis Date  . Bladder tumor   . BPH (benign prostatic hypertrophy) with urinary obstruction   . Cancer [facility])    Bladder Cancer 2018  . Complication of anesthesia    severe pain after last bladder tumor resection at [place] had to stay overnight due to pain  . Frequency of urination   . History of adenomatous polyp of colon   . History of closed head injury    2000-- fell off ladder--  temporary memory loss resolved  . History of psychosis    x2  last one documented 2006  w/ hallucination  and suicide ideation  . Nocturia   . Seizures ([place])    last noctural seizure 6 months ago as of 08-05-2018, no day time seizures in years seizure  . Sigmoid diverticulosis   . Temporal lobe epilepsy syndrome [facility]) neurologist-  dr Leta Baptist--  Linden Dolin one every 2 weeks "legs jerking around, per wife , pt unaware he's doing this"   localization-related right temporal lobe --  mostly nocturnal w/ bilateral lower extremitity movement, staring and moaning then dissorietation afterwards (last daytime seizure June 2013)  . Urge urinary incontinence    resolved  . Vesicoureteral reflux, bilateral   . Wears glasses    Past Surgical History:  Procedure Laterality Date  . COLONOSCOPY    . COLONOSCOPY W/ POLYPECTOMY  09-11-2009  . GREEN LIGHT LASER TURP (TRANSURETHRAL RESECTION OF PROSTATE N/A 04/07/2014   Procedure: GREEN LIGHT LASER TURP (TRANSURETHRAL RESECTION OF PROSTATE;  Surgeon: Ailene Rud, MD;  Location: [facility];  Service: Urology;  Laterality: N/A;  . INGUINAL HERNIA REPAIR Bilateral 04/15/2013   Procedure: LAPAROSCOPIC BILATERAL INGUINAL HERNIA REPAIR;   Surgeon: Gayland Curry, MD;  [facility];  Service: General;  Laterality: Bilateral;  . INSERTION OF MESH N/A 04/15/2013   Procedure: INSERTION OF MESH;  Surgeon: Gayland Curry, MD;  [facility];  Service: General;  Laterality: N/A;  . POLYPECTOMY    . TRANSURETHRAL RESECTION OF BLADDER  2018  . UMBILICAL HERNIA REPAIR N/A 04/15/2013   Procedure: OPEN HERNIA REPAIR UMBILICAL ADULT;  Surgeon: Gayland Curry, MD;  [facility];  Service: General;  Laterality: N/A;  . VAGUS NERVE STIMULATOR INSERTION Left 01/28/2018   Procedure: VAGAL NERVE STIMULATOR PLACEMENT;  Surgeon: Consuella Lose, MD;  Location: [place];  Service: Neurosurgery;  Laterality: Left;  VAGAL NERVE STIMULATOR PLACEMENT    Home Medications:  Current Facility-Administered Medications  Medication Dose Route Frequency Provider Last Rate Last Dose  . ceFAZolin (ANCEF) IVPB 2g/100 mL premix  2 g Intravenous 30 min Pre-Op , Candee Furbish, MD      . lactated ringers infusion   Intravenous Continuous Audry Pili, MD 100 mL/hr at 08/13/18 1015     Allergies:  Allergies  Allergen Reactions  . Chlorhexidine Rash    Full body  . Povidone-Iodine Rash    Family History  Problem Relation Age of Onset  . Seizures Father   . Heart disease Father   . Cancer Father        prostat  . Cancer Mother        stomach  . Stomach cancer Mother   .  Colon cancer Neg Hx   . Colon polyps Neg Hx   . Esophageal cancer Neg Hx   . Rectal cancer Neg Hx    Social History:  reports that he quit smoking about 46 years ago. His smoking use included cigarettes. He has a 15.00 pack-year smoking history. He has never used smokeless tobacco. He reports current alcohol use of about 14.0 standard drinks of alcohol per week. He reports that he does not use drugs.  Review of Systems  All other systems reviewed and are negative.   Physical Exam:  Vital signs in last 24 hours: Temp:  [97.8 F (36.6 C)] 97.8 F (36.6 C) (04/17  0944) Pulse Rate:  [65] 65 (04/17 0944) Resp:  [18] 18 (04/17 0944) BP: (113)/(72) 113/72 (04/17 0944) SpO2:  [100 %] 100 % (04/17 0944) Physical Exam  Constitutional: He is oriented to person, place, and time. He appears well-developed and well-nourished.  HENT:  Head: Normocephalic and atraumatic.  Eyes: Pupils are equal, round, and reactive to light. EOM are normal.  Neck: Normal range of motion. No thyromegaly present.  Cardiovascular: Normal rate and regular rhythm.  Respiratory: Effort normal. No respiratory distress.  GI: Soft. He exhibits no distension.  Musculoskeletal: Normal range of motion.        General: No edema.  Neurological: He is alert and oriented to person, place, and time.  Skin: Skin is warm and dry.  Psychiatric: He has a normal mood and affect. His behavior is normal. Judgment and thought content normal.    Laboratory Data:  No results found for this or any previous visit (from the past 24 hour(s)). No results found for this or any previous visit (from the past 240 hour(s)). Creatinine: No results for input(s): CREATININE in the last 168 hours. Baseline Creatinine: unknown  Impression/Assessment:  73yo with recurrent bladder tumor  Plan:  The risks/benefits/alterantives to TURBT was explained to the patient and he understands and wishes to proceed with surgery  John Holt 08/13/2018, 11:35 AM

## 2018-08-13 NOTE — Transfer of Care (Signed)
Immediate Anesthesia Transfer of Care Note  Patient: John Holt  Procedure(s) Performed: TRANSURETHRAL RESECTION OF BLADDER TUMOR (TURBT) (N/A ) CYSTOSCOPY WITH RETROGRADE PYELOGRAM (Bilateral )  Patient Location: PACU  Anesthesia Type:General  Level of Consciousness: awake, alert , oriented and patient cooperative  Airway & Oxygen Therapy: Patient Spontanous Breathing and Patient connected to face mask oxygen  Post-op Assessment: Report given to RN, Post -op Vital signs reviewed and stable and Patient moving all extremities  Post vital signs: Reviewed and stable  Last Vitals:  Vitals Value Taken Time  BP    Temp    Pulse    Resp    SpO2      Last Pain:  Vitals:   08/13/18 1001  TempSrc:   PainSc: 0-No pain         Complications: No apparent anesthesia complications

## 2018-08-13 NOTE — Discharge Instructions (Signed)
Indwelling Urinary Catheter Care, Adult An indwelling urinary catheter is a thin tube that is put into your bladder. The tube helps to drain pee (urine) out of your body. The tube goes in through your urethra. Your urethra is where pee comes out of your body. Your pee will come out through the catheter, then it will go into a bag (drainage bag). Take John Holt care of your catheter so it will work well. How to wear your catheter and bag Supplies needed  Sticky tape (adhesive tape) or a leg strap.  Alcohol wipe or soap and water (if you use tape).  A clean towel (if you use tape).  Large overnight bag.  Smaller bag (leg bag). Wearing your catheter Attach your catheter to your leg with tape or a leg strap.  Make sure the catheter is not pulled tight.  If a leg strap gets wet, take it off and put on a dry strap.  If you use tape to hold the bag on your leg: 1. Use an alcohol wipe or soap and water to wash your skin where the tape made it sticky before. 2. Use a clean towel to pat-dry that skin. 3. Use new tape to make the bag stay on your leg. Wearing your bags You should have been given a large overnight bag.  You may wear the overnight bag in the day or night.  Always have the overnight bag lower than your bladder.  Do not let the bag touch the floor.  Before you go to sleep, put a clean plastic bag in a wastebasket. Then hang the overnight bag inside the wastebasket. You should also have a smaller leg bag that fits under your clothes.  Always wear the leg bag below your knee.  Do not wear your leg bag at night. How to care for your skin and catheter Supplies needed  A clean washcloth.  Water and mild soap.  A clean towel. Caring for your skin and catheter      Clean the skin around your catheter every day: ? Wash your hands with soap and water. ? Wet a clean washcloth in warm water and mild soap. ? Clean the skin around your urethra. ? If you are male: ? Gently  spread the folds of skin around your vagina (labia). ? With the washcloth in your other hand, wipe the inner side of your labia on each side. Wipe from front to back. ? If you are male: ? Pull back any skin that covers the end of your penis (foreskin). ? With the washcloth in your other hand, wipe your penis in small circles. Start wiping at the tip of your penis, then move away from the catheter. ? With your free hand, hold the catheter close to where it goes into your body. ? Keep holding the catheter during cleaning so it does not get pulled out. ? With the washcloth in your other hand, clean the catheter. ? Only wipe downward on the catheter. ? Do not wipe upward toward your body. Doing this may push germs into your urethra and cause infection. ? Use a clean towel to pat-dry the catheter and the skin around it. Make sure to wipe off all soap. ? Wash your hands with soap and water.  Shower every day. Do not take baths.  Do not use cream, ointment, or lotion on the area where the catheter goes into your body, unless your doctor tells you to.  Do not use powders, sprays, or lotions  on your genital area.  Check your skin around the catheter every day for signs of infection. Check for: ? Redness, swelling, or pain. ? Fluid or blood. ? Warmth. ? Pus or a bad smell. How to empty the bag Supplies needed  Rubbing alcohol.  Gauze pad or cotton ball.  Tape or a leg strap. Emptying the bag Pour the pee out of your bag when it is ?- full, or at least 2-3 times a day. Do this for your overnight bag and your leg bag. 1. Wash your hands with soap and water. 2. Separate (detach) the bag from your leg. 3. Hold the bag over the toilet or a clean pail. Keep the bag lower than your hips and bladder. This is so the pee (urine) does not go back into the tube. 4. Open the pour spout. It is at the bottom of the bag. 5. Empty the pee into the toilet or pail. Do not let the pour spout touch any  surface. 6. Put rubbing alcohol on a gauze pad or cotton ball. 7. Use the gauze pad or cotton ball to clean the pour spout. 8. Close the pour spout. 9. Attach the bag to your leg with tape or a leg strap. 10. Wash your hands with soap and water. Follow instructions for cleaning the drainage bag:  From the product maker.  As told by your doctor. How to change the bag Supplies needed  Alcohol wipes.  A clean bag.  Tape or a leg strap. Changing the bag Replace your bag with a clean bag once a month. If it starts to leak, smell bad, or look dirty, change it sooner. 1. Wash your hands with soap and water. 2. Separate the dirty bag from your leg. 3. Pinch the catheter with your fingers so that pee does not spill out. 4. Separate the catheter tube from the bag tube where these tubes connect (at the connection valve). Do not let the tubes touch any surface. 5. Clean the end of the catheter tube with an alcohol wipe. Use a different alcohol wipe to clean the end of the bag tube. 6. Connect the catheter tube to the tube of the clean bag. 7. Attach the clean bag to your leg with tape or a leg strap. Do not make the bag tight on your leg. 8. Wash your hands with soap and water. General rules   Never pull on your catheter. Never try to take it out. Doing that can hurt you.  Always wash your hands before and after you touch your catheter or bag. Use a mild, fragrance-free soap. If you do not have soap and water, use hand sanitizer.  Always make sure there are no twists or bends (kinks) in the catheter tube.  Always make sure there are no leaks in the catheter or bag.  Drink enough fluid to keep your pee pale yellow.  Do not take baths, swim, or use a hot tub.  If you are male, wipe from front to back after you poop (have a bowel movement). Contact a doctor if:  Your pee is cloudy.  Your pee smells worse than usual.  Your catheter gets clogged.  Your catheter leaks.  Your  bladder feels full. Get help right away if:  You have redness, swelling, or pain where the catheter goes into your body.  You have fluid, blood, pus, or a bad smell coming from the area where the catheter goes into your body.  Your skin feels warm  where the catheter goes into your body.  You have a fever.  You have pain in your: ? Belly (abdomen). ? Legs. ? Lower back. ? Bladder.  You see blood in the catheter.  Your pee is pink or red.  You feel sick to your stomach (nauseous).  You throw up (vomit).  You have chills.  Your pee is not draining into the bag.  Your catheter gets pulled out. Summary  An indwelling urinary catheter is a thin tube that is placed into the bladder to help drain pee (urine) out of the body.  The catheter is placed into the part of the body that drains pee from the bladder (urethra).  Taking John Holt care of your catheter will keep it working properly and help prevent problems.  Always wash your hands before and after touching your catheter or bag.  Never pull on your catheter or try to take it out. This information is not intended to replace advice given to you by your health care provider. Make sure you discuss any questions you have with your health care provider. Document Released: 08/09/2012 Document Revised: 10/05/2017 Document Reviewed: 11/28/2016 Elsevier Interactive Patient Education  2019 [place] Anesthesia, Adult, Care After This sheet gives you information about how to care for yourself after your procedure. Your health care provider may also give you more specific instructions. If you have problems or questions, contact your health care provider. What can I expect after the procedure? After the procedure, the following side effects are common:  Pain or discomfort at the IV site.  Nausea.  Vomiting.  Sore throat.  Trouble concentrating.  Feeling cold or chills.  Weak or tired.  Sleepiness and  fatigue.  Soreness and body aches. These side effects can affect parts of the body that were not involved in surgery. Follow these instructions at home:  For at least 24 hours after the procedure:  Have a responsible adult stay with you. It is important to have someone help care for you until you are awake and alert.  Rest as needed.  Do not: ? Participate in activities in which you could fall or become injured. ? Drive. ? Use heavy machinery. ? Drink alcohol. ? Take sleeping pills or medicines that cause drowsiness. ? Make important decisions or sign legal documents. ? Take care of children on your own. Eating and drinking  Follow any instructions from your health care provider about eating or drinking restrictions.  When you feel hungry, start by eating small amounts of foods that are soft and easy to digest (bland), such as toast. Gradually return to your regular diet.  Drink enough fluid to keep your urine pale yellow.  If you vomit, rehydrate by drinking water, juice, or clear broth. General instructions  If you have sleep apnea, surgery and certain medicines can increase your risk for breathing problems. Follow instructions from your health care provider about wearing your sleep device: ? Anytime you are sleeping, including during daytime naps. ? While taking prescription pain medicines, sleeping medicines, or medicines that make you drowsy.  Return to your normal activities as told by your health care provider. Ask your health care provider what activities are safe for you.  Take over-the-counter and prescription medicines only as told by your health care provider.  If you smoke, do not smoke without supervision.  Keep all follow-up visits as told by your health care provider. This is important. Contact a health care provider if:  You have nausea  or vomiting that does not get better with medicine.  You cannot eat or drink without vomiting.  You have pain that  does not get better with medicine.  You are unable to pass urine.  You develop a skin rash.  You have a fever.  You have redness around your IV site that gets worse. Get help right away if:  You have difficulty breathing.  You have chest pain.  You have blood in your urine or stool, or you vomit blood. Summary  After the procedure, it is common to have a sore throat or nausea. It is also common to feel tired.  Have a responsible adult stay with you for the first 24 hours after general anesthesia. It is important to have someone help care for you until you are awake and alert.  When you feel hungry, start by eating small amounts of foods that are soft and easy to digest (bland), such as toast. Gradually return to your regular diet.  Drink enough fluid to keep your urine pale yellow.  Return to your normal activities as told by your health care provider. Ask your health care provider what activities are safe for you. This information is not intended to replace advice given to you by your health care provider. Make sure you discuss any questions you have with your health care provider. Document Released: 07/21/2000 Document Revised: 11/28/2016 Document Reviewed: 11/28/2016 Elsevier Interactive Patient Education  2019 [employer].

## 2018-08-13 NOTE — Anesthesia Preprocedure Evaluation (Addendum)
Anesthesia Evaluation  Patient identified by MRN, date of birth, ID band Patient awake    Reviewed: Allergy & Precautions, NPO status , Patient's Chart, lab work & pertinent test results  History of Anesthesia Complications Negative for: history of anesthetic complications  Airway Mallampati: II  TM Distance: >3 FB Neck ROM: Full    Dental  (+) Dental Advisory Given, Teeth Intact   Pulmonary former smoker,    breath sounds clear to auscultation       Cardiovascular Exercise Tolerance: Good negative cardio ROS   Rhythm:Regular Rate:Normal     Neuro/Psych Seizures -,  PSYCHIATRIC DISORDERS  Hx psychosis with suicidal ideation    GI/Hepatic negative GI ROS, Neg liver ROS,   Endo/Other  negative endocrine ROS  Renal/GU negative Renal ROS    BPH Bladder cancer     Musculoskeletal negative musculoskeletal ROS (+)   Abdominal   Peds  Hematology negative hematology ROS (+)   Anesthesia Other Findings   Reproductive/Obstetrics                            Anesthesia Physical Anesthesia Plan  ASA: III  Anesthesia Plan: General   Post-op Pain Management:    Induction: Intravenous  PONV Risk Score and Plan: 3 and Treatment may vary due to age or medical condition, Ondansetron and Propofol infusion  Airway Management Planned: Oral ETT  Additional Equipment: None  Intra-op Plan:   Post-operative Plan: Extubation in OR  Informed Consent: I have reviewed the patients History and Physical, chart, labs and discussed the procedure including the risks, benefits and alternatives for the proposed anesthesia with the patient or authorized representative who has indicated his/her understanding and acceptance.     Dental advisory given  Plan Discussed with: CRNA and Anesthesiologist  Anesthesia Plan Comments:        Anesthesia Quick Evaluation

## 2018-08-13 NOTE — Op Note (Signed)
.  Preoperative diagnosis: bladder tumor  Postoperative diagnosis: Same  Procedure: 1 cystoscopy 2. bilateral retrograde pyelography 3.  Intraoperative fluoroscopy, under one hour, with interpretation 4.Transurethral resection of bladder tumor, medium  Attending: Nicolette Bang  Anesthesia: General  Estimated blood loss: Minimal  Drains: 22 French foley  Specimens: bladder tumor  Antibiotics: ancef  Findings: 3cm sessile trigone and posterior wall tumor.   Ureteral orifices in normal anatomic location, golf-hole appearance. . No hydronephrosis but ureters are tortuous.No filling defects in either collecting system  Indications: Patient is a 73 year old male with a history of muscle invasive bladder cancer and tumor recurrence of office cystoscopy.  After discussing treatment options, they decided proceed with transurethral resection of a bladder tumor.  Procedure her in detail: The patient was brought to the operating room and a brief timeout was done to ensure correct patient, correct procedure, correct site.  General anesthesia was administered patient was placed in dorsal lithotomy position.  Their genitalia was then prepped and draped in usual sterile fashion.  A rigid 62 French cystoscope was passed in the urethra and the bladder.  Bladder was inspected and we noted  a 3cm bladder tumor.  the ureteral orifices were in the normal orthotopic locations.  a 6 french ureteral catheter was then instilled into the left ureteral orifice.  a gentle retrograde was obtained and findings noted above. We then turned our attention to the right side. a 6 french ureteral catheter was then instilled into the right ureteral orifice.  a gentle retrograde was obtained and findings noted above. We then removed the cystoscope and placed a resectoscope into the bladder. We proceeded to remove the large clot burden from the bladder. Once this was complete we turned our attention to the bladder tumor. Using the  bipolar resectoscope we removed the bladder tumor down to the base. A subsequent muscle deep biopsy was then taken. Hemostasis was then obtained with electrocautery. We then removed the bladder tumor chips and sent them for pathology. We then re-inspected the bladder and found no residula bleeding.  the bladder was then drained, a 22 French foley was placed and this concluded the procedure which was well tolerated by patient.  Complications: None  Condition: Stable, extubated, transferred to PACU  Plan: Patient is to be discharged followup in 5 days for foley catheter removal and pathology discussion.

## 2018-08-14 ENCOUNTER — Emergency Department (HOSPITAL_BASED_OUTPATIENT_CLINIC_OR_DEPARTMENT_OTHER): Payer: Medicare Other

## 2018-08-14 ENCOUNTER — Emergency Department (HOSPITAL_COMMUNITY)
Admission: EM | Admit: 2018-08-14 | Discharge: 2018-08-14 | Disposition: A | Discharge status: Home / Self Care | Payer: Medicare Other | Attending: Emergency Medicine | Admitting: Emergency Medicine

## 2018-08-14 ENCOUNTER — Encounter (HOSPITAL_COMMUNITY): Payer: Self-pay

## 2018-08-14 ENCOUNTER — Other Ambulatory Visit: Payer: Self-pay

## 2018-08-14 DIAGNOSIS — Z79899 Other long term (current) drug therapy: Secondary | ICD-10-CM | POA: Insufficient documentation

## 2018-08-14 DIAGNOSIS — Z87891 Personal history of nicotine dependence: Secondary | ICD-10-CM | POA: Insufficient documentation

## 2018-08-14 DIAGNOSIS — R609 Edema, unspecified: Secondary | ICD-10-CM | POA: Diagnosis not present

## 2018-08-14 DIAGNOSIS — R600 Localized edema: Secondary | ICD-10-CM | POA: Insufficient documentation

## 2018-08-14 LAB — CBC WITH DIFFERENTIAL/PLATELET
Abs Immature Granulocytes: 0.02 10*3/uL (ref 0.00–0.07)
Basophils Absolute: 0 10*3/uL (ref 0.0–0.1)
Basophils Relative: 0 %
Eosinophils Absolute: 0.2 10*3/uL (ref 0.0–0.5)
Eosinophils Relative: 4 %
HCT: 43.7 % (ref 39.0–52.0)
Hemoglobin: 14 g/dL (ref 13.0–17.0)
Immature Granulocytes: 0 %
Lymphocytes Relative: 10 %
Lymphs Abs: 0.6 10*3/uL — ABNORMAL LOW (ref 0.7–4.0)
MCH: 29.4 pg (ref 26.0–34.0)
MCHC: 32 g/dL (ref 30.0–36.0)
MCV: 91.6 fL (ref 80.0–100.0)
Monocytes Absolute: 0.8 10*3/uL (ref 0.1–1.0)
Monocytes Relative: 13 %
Neutro Abs: 4.5 10*3/uL (ref 1.7–7.7)
Neutrophils Relative %: 73 %
Platelets: 164 10*3/uL (ref 150–400)
RBC: 4.77 MIL/uL (ref 4.22–5.81)
RDW: 14.2 % (ref 11.5–15.5)
WBC: 6.2 10*3/uL (ref 4.0–10.5)
nRBC: 0 % (ref 0.0–0.2)

## 2018-08-14 LAB — BASIC METABOLIC PANEL
Anion gap: 13 (ref 5–15)
BUN: 13 mg/dL (ref 8–23)
CO2: 23 mmol/L (ref 22–32)
Calcium: 9.9 mg/dL (ref 8.9–10.3)
Chloride: 104 mmol/L (ref 98–111)
Creatinine, Ser: 0.95 mg/dL (ref 0.61–1.24)
GFR calc Af Amer: >60 mL/min (ref >60)
GFR calc non Af Amer: >60 mL/min (ref >60)
Glucose, Bld: 81 mg/dL (ref 70–99)
Potassium: 3.9 mmol/L (ref 3.5–5.1)
Sodium: 140 mmol/L (ref 135–145)

## 2018-08-14 NOTE — ED Triage Notes (Signed)
Pt from home for evaluation of bilateral leg swelling; had transurethral bladder resection yesterday; denies sob, denies pain; pt has no other complaints

## 2018-08-14 NOTE — ED Notes (Signed)
Discharge instructions and follow up care were discussed with pt.pt has no questions at this time. Pt. States elevation has helped decrease swelling.

## 2018-08-14 NOTE — ED Provider Notes (Signed)
Medical screening examination/treatment/procedure(s) were conducted as a shared visit with non-physician practitioner(s) and myself.  I personally evaluated the patient during the encounter.  Clinical Impression:   Final diagnoses:  Lower extremity edema   The patient has had a recent TURP, he has had a Foley catheter placed with a leg bag on the left, this occurred yesterday.  Now having bilateral swelling of the legs which was slightly asymmetrical for which she was referred to the emergency department for testing for DVT.  No shortness of breath, vital signs reassuring, legs with minimal edema which he states has corrected since raising his legs.  Studies negative, stable for discharge parents given.  Patient agreeable   Noemi Chapel, MD 08/16/18 1331

## 2018-08-14 NOTE — ED Provider Notes (Signed)
[facility] EMERGENCY DEPARTMENT Provider Note   CSN: [id number] Arrival date & time: 08/14/18  1629    History   Chief Complaint Chief Complaint  Patient presents with  . Leg Swelling    HPI JURIS GOSNELL is a 73 y.o. male.     HPI  Patient is a 73 year old male with a past medical history of BPH, bladder cancer status post transurethral bladder resection performed yesterday with Foley in place presenting for bilateral leg swelling.  Reports that the right was greater than the left.  Denies erythema.  He denies any shortness of breath or chest pain.  Patient reports that he called his urologist recommended come to the emergency department to rule out DVT.  Denies history of heart failure, renal or kidney disease.  He denies any fever or chills since surgery.  He denies any hematuria.  Patient ports that he has been placing his feet in a dependent position most of the time due to the leg bag and presenting refluxing of the urine up into the bladder.  Patient reports that since lifting them in the emergency department they have improved in their swelling.  Past Medical History:  Diagnosis Date  . Bladder tumor   . BPH (benign prostatic hypertrophy) with urinary obstruction   . Cancer [facility])    Bladder Cancer 2018  . Complication of anesthesia    severe pain after last bladder tumor resection at [place] had to stay overnight due to pain  . Frequency of urination   . History of adenomatous polyp of colon   . History of closed head injury    2000-- fell off ladder--  temporary memory loss resolved  . History of psychosis    x2  last one documented 2006  w/ hallucination  and suicide ideation  . Nocturia   . Seizures ([place])    last noctural seizure 6 months ago as of 08-05-2018, no day time seizures in years seizure  . Sigmoid diverticulosis   . Temporal lobe epilepsy syndrome [facility] [place] [facility]) neurologist-  dr Leta Baptist--  Linden Dolin one every 2 weeks "legs jerking around,  per wife , pt unaware he's doing this"   localization-related right temporal lobe --  mostly nocturnal w/ bilateral lower extremitity movement, staring and moaning then dissorietation afterwards (last daytime seizure June 2013)  . Urge urinary incontinence    resolved  . Vesicoureteral reflux, bilateral   . Wears glasses     Patient Active Problem List   Diagnosis Date Noted  . Localization-related symptomatic epilepsy and epileptic syndromes with complex partial seizures, intractable, without status epilepticus ([place]) 08/11/2012    Past Surgical History:  Procedure Laterality Date  . COLONOSCOPY    . COLONOSCOPY W/ POLYPECTOMY  09-11-2009  . GREEN LIGHT LASER TURP (TRANSURETHRAL RESECTION OF PROSTATE N/A 04/07/2014   Procedure: GREEN LIGHT LASER TURP (TRANSURETHRAL RESECTION OF PROSTATE;  Surgeon: Ailene Rud, MD;  Location: [facility];  Service: Urology;  Laterality: N/A;  . INGUINAL HERNIA REPAIR Bilateral 04/15/2013   Procedure: LAPAROSCOPIC BILATERAL INGUINAL HERNIA REPAIR;  Surgeon: Gayland Curry, MD;  Location: [facility];  Service: General;  Laterality: Bilateral;  . INSERTION OF MESH N/A 04/15/2013   Procedure: INSERTION OF MESH;  Surgeon: Gayland Curry, MD;  Location: [facility];  Service: General;  Laterality: N/A;  . POLYPECTOMY    . TRANSURETHRAL RESECTION OF BLADDER  2018  . UMBILICAL HERNIA REPAIR N/A 04/15/2013   Procedure: OPEN HERNIA REPAIR UMBILICAL ADULT;  Surgeon: Gayland Curry, MD;  Location: [facility];  Service: General;  Laterality: N/A;  . VAGUS NERVE STIMULATOR INSERTION Left 01/28/2018   Procedure: VAGAL NERVE STIMULATOR PLACEMENT;  Surgeon: Consuella Lose, MD;  Location: [place];  Service: Neurosurgery;  Laterality: Left;  VAGAL NERVE STIMULATOR PLACEMENT        Home Medications    Prior to Admission medications   Medication Sig Start Date End Date Taking? Authorizing Provider  acetaminophen (TYLENOL) 325 MG tablet Take 650 mg by mouth  every 6 (six) hours as needed for moderate pain.  05/26/18   [provider]  cloBAZam (ONFI) 10 MG tablet Take 1.5 tablets daily Patient taking differently: Take 10 mg by mouth at bedtime.  07/16/18   Cameron Sprang, MD  clonazePAM (KLONOPIN) 0.5 MG tablet Take 1 tablet (0.5 mg total) by mouth daily as needed (epilepsy). 07/16/18   Cameron Sprang, MD  HYDROcodone-acetaminophen (NORCO/VICODIN) 5-325 MG tablet Take 1 tablet by mouth every 4 (four) hours as needed for moderate pain. 08/13/18 08/13/19  Cleon Gustin, MD  Multiple Vitamins-Minerals (PRESERVISION AREDS 2+MULTI VIT) CAPS Take 1 capsule by mouth 2 (two) times daily.    [provider]  phenazopyridine (PYRIDIUM) 100 MG tablet Take 1 tablet (100 mg total) by mouth 3 (three) times daily as needed for pain. 08/13/18   McKenzie, Candee Furbish, MD  tadalafil (ADCIRCA/CIALIS) 20 MG tablet Take 20 mg by mouth daily as needed for erectile dysfunction.     [provider]  zonisamide (ZONEGRAN) 100 MG capsule Take 3 caps every night Patient taking differently: Take 300 mg by mouth at bedtime.  07/16/18   Cameron Sprang, MD    Family History Family History  Problem Relation Age of Onset  . Seizures Father   . Heart disease Father   . Cancer Father        prostat  . Cancer Mother        stomach  . Stomach cancer Mother   . Colon cancer Neg Hx   . Colon polyps Neg Hx   . Esophageal cancer Neg Hx   . Rectal cancer Neg Hx     Social History Social History   Tobacco Use  . Smoking status: Former Smoker    Packs/day: 1.00    Years: 15.00    Pack years: 15.00    Types: Cigarettes    Last attempt to quit: 04/03/1972    Years since quitting: 46.3  . Smokeless tobacco: Never Used  Substance Use Topics  . Alcohol use: Yes    Alcohol/week: 14.0 standard drinks    Types: 14 Glasses of wine per week    Comment: 1 or 2  wine daily; no consumption in last 24 hours  . Drug use: No     Allergies   Chlorhexidine  and Povidone-iodine   Review of Systems Review of Systems  Constitutional: Negative for chills and fever.  HENT: Negative for congestion and rhinorrhea.   Eyes: Negative for visual disturbance.  Respiratory: Negative for cough and shortness of breath.   Cardiovascular: Positive for leg swelling. Negative for chest pain.  Gastrointestinal: Negative for nausea and vomiting.  Genitourinary: Negative for dysuria, flank pain and hematuria.  Musculoskeletal: Negative for arthralgias and myalgias.  Skin: Negative for color change.  Neurological: Negative for weakness and numbness.     Physical Exam Updated Vital Signs BP 129/71   Pulse 64   Temp 98.2 F (36.8 C) (Oral)   Resp  16   Ht 5\' 8"  (1.727 m)   Wt 62.6 kg   SpO2 100%   BMI 20.98 kg/m   Physical Exam Vitals signs and nursing note reviewed.  Constitutional:      General: He is not in acute distress.    Appearance: He is well-developed. He is not ill-appearing or diaphoretic.  HENT:     Head: Normocephalic and atraumatic.  Eyes:     Conjunctiva/sclera: Conjunctivae normal.     Pupils: Pupils are equal, round, and reactive to light.  Neck:     Musculoskeletal: Normal range of motion and neck supple.  Cardiovascular:     Rate and Rhythm: Normal rate and regular rhythm.     Heart sounds: S1 normal and S2 normal. No murmur.  Pulmonary:     Effort: Pulmonary effort is normal.     Breath sounds: Normal breath sounds. No wheezing or rales.  Abdominal:     General: There is no distension.     Palpations: Abdomen is soft.     Tenderness: There is no abdominal tenderness. There is no guarding.  Genitourinary:    Comments: Leg bag with clear urine.  Musculoskeletal: Normal range of motion.        General: No deformity.     Right lower leg: Edema present.     Left lower leg: Edema present.     Comments: Intact, 2+ DP pulses bilaterally.  Increased varicose veins on the right compared to left.  Trace nonpitting edema of  bilateral lower extremities, greater on right than left.  No calf tenderness.  Lymphadenopathy:     Cervical: No cervical adenopathy.  Skin:    General: Skin is warm and dry.     Findings: No erythema or rash.  Neurological:     Mental Status: He is alert.     Comments: Cranial nerves grossly intact. Patient moves extremities symmetrically and with good coordination.  Psychiatric:        Behavior: Behavior normal.        Thought Content: Thought content normal.        Judgment: Judgment normal.      ED Treatments / Results  Labs (all labs ordered are listed, but only abnormal results are displayed) Labs Reviewed  CBC WITH DIFFERENTIAL/PLATELET - Abnormal; Notable for the following components:      Result Value   Lymphs Abs 0.6 (*)    All other components within normal limits  BASIC METABOLIC PANEL    EKG None  Radiology Dg C-arm 1-60 Min-no Report  Result Date: 08/13/2018 Fluoroscopy was utilized by the requesting physician.  No radiographic interpretation.   Vas [place] Lower Extremity Venous (dvt) (only [facility])  Result Date: 08/14/2018  Lower Venous Study Indications: Edema.  Risk Factors: S/P Transurethral resection of bladder tumor 08/13/18 Limitations: Patient tensing with pain from compression. Catheter/leg bag left thigh. Comparison Study: Prior study from 10/18/16 is available for comparison Performing Technologist: Sharion Dove RVS  Examination Guidelines: A complete evaluation includes B-mode imaging, spectral Doppler, color Doppler, and power Doppler as needed of all accessible portions of each vessel. Bilateral testing is considered an integral part of a complete examination. Limited examinations for reoccurring indications may be performed as noted.  +---------+---------------+---------+-----------+----------+--------------+ RIGHT    CompressibilityPhasicitySpontaneityPropertiesSummary         +---------+---------------+---------+-----------+----------+--------------+ CFV      Full           Yes      Yes                                 +---------+---------------+---------+-----------+----------+--------------+  SFJ      Full                                                        +---------+---------------+---------+-----------+----------+--------------+ FV Prox  Full                                                        +---------+---------------+---------+-----------+----------+--------------+ FV Mid   Full                                                        +---------+---------------+---------+-----------+----------+--------------+ FV Distal               Yes      Yes                                 +---------+---------------+---------+-----------+----------+--------------+ POP      Full                                                        +---------+---------------+---------+-----------+----------+--------------+ PTV      Full                                                        +---------+---------------+---------+-----------+----------+--------------+ PERO                                                  Not visualized +---------+---------------+---------+-----------+----------+--------------+ GSV      Full                                                        +---------+---------------+---------+-----------+----------+--------------+   +-------+---------------+---------+-----------+----------+----------+ LEFT   CompressibilityPhasicitySpontaneityPropertiesSummary    +-------+---------------+---------+-----------+----------+----------+ CFV    Full                                                    +-------+---------------+---------+-----------+----------+----------+ SFJ    Full                                                    +-------+---------------+---------+-----------+----------+----------+  FV ProxFull                                                     +-------+---------------+---------+-----------+----------+----------+ FV Mid Full                                                    +-------+---------------+---------+-----------+----------+----------+ PFV    Full                                                    +-------+---------------+---------+-----------+----------+----------+ POP    Full                                                    +-------+---------------+---------+-----------+----------+----------+ PTV                                                 visualized +-------+---------------+---------+-----------+----------+----------+ PERO                                                visualized +-------+---------------+---------+-----------+----------+----------+     Summary: Right: There is no evidence of deep vein thrombosis in the lower extremity. However, portions of this examination were limited- see technologist comments above.There is no evidence of superficial venous thrombosis. Left: There is no evidence of deep vein thrombosis in the lower extremity. However, portions of this examination were limited- see technologist comments above.  *See table(s) above for measurements and observations.    Preliminary     Procedures Procedures (including critical care time)  Medications Ordered in ED Medications - No data to display   Initial Impression / Assessment and Plan / ED Course  I have reviewed the triage vital signs and the nursing notes.  Pertinent labs & imaging results that were available during my care of the patient were reviewed by me and considered in my medical decision making (see chart for details).        Patient is nontoxic-appearing, afebrile, with normal vital signs.  Patient exhibits trace lower extremity edema, slightly greater on the right than left.  He does have more varicose veins on the right than the left.  He has no  calf tenderness.  He has intact, 2+ distal pulses.  No evidence of generalized fluid overload or pulmonary edema.  Patient had improvement of his symptoms with elevating his legs.  DVT study was negative for thromboembolism.  Patient has normal labs here.  I encouraged the patient to ambulate, and lift his legs as tolerated while maintaining a gradient for the urine to flow.  He is instructed return for any shortness of breath, increasing swelling, or new or worsening symptoms.  Patient is in understanding and agrees with the plan of care.  This is a shared visit with Dr. Noemi Chapel. Patient was independently evaluated by this attending physician. Attending physician consulted in evaluation and discharge management.  Final Clinical Impressions(s) / ED Diagnoses   Final diagnoses:  Lower extremity edema    ED Discharge Orders    None       Tamala Julian 08/14/18 1912    Noemi Chapel, MD 08/16/18 1331

## 2018-08-14 NOTE — Discharge Instructions (Addendum)
Please see the information and instructions below regarding your visit.  Your diagnoses today include:  1. Lower extremity edema     Tests performed today include: See side panel of your discharge paperwork for testing performed today. Vital signs are listed at the bottom of these instructions.   Medications prescribed:    Take any prescribed medications only as prescribed, and any over the counter medications only as directed on the packaging.  Resume your regular medications.  Home care instructions:  Please follow any educational materials contained in this packet.   Follow-up instructions: Please follow-up with Dr. Alyson Ingles per your previous scheduled appointments.   Return instructions:  Please return to the Emergency Department if you experience worsening symptoms.  Please return to the emergency department if develop any shortness of breath, chest pain, worsening swelling or redness of the legs. Please return if you have any other emergent concerns.  Additional Information:   Your vital signs today were: BP 129/71    Pulse 64    Temp 98.2 F (36.8 C) (Oral)    Resp 16    Ht 5\' 8"  (1.727 m)    Wt 62.6 kg    SpO2 100%    BMI 20.98 kg/m  If your blood pressure (BP) was elevated on multiple readings during this visit above 130 for the top number or above 80 for the bottom number, please have this repeated by your primary care provider within one month. --------------  Thank you for allowing [place] to participate in your care today.

## 2018-08-16 ENCOUNTER — Encounter (HOSPITAL_COMMUNITY): Payer: Self-pay | Admitting: Urology

## 2018-08-25 ENCOUNTER — Telehealth (INDEPENDENT_AMBULATORY_CARE_PROVIDER_SITE_OTHER): Payer: Medicare Other | Admitting: Neurology

## 2018-08-25 ENCOUNTER — Telehealth: Payer: Self-pay | Admitting: Neurology

## 2018-08-25 ENCOUNTER — Other Ambulatory Visit: Payer: Self-pay

## 2018-08-25 DIAGNOSIS — G40219 Localization-related (focal) (partial) symptomatic epilepsy and epileptic syndromes with complex partial seizures, intractable, without status epilepticus: Secondary | ICD-10-CM

## 2018-08-25 NOTE — Telephone Encounter (Signed)
Called wife's number, no answer, left VM. Will communicate via MyChart but also asked them to call back with best number/time to talk

## 2018-08-25 NOTE — Telephone Encounter (Signed)
Patient left message with answering service at 5:09 on 08-24-18   Patient wife states patient had a seizure at 5:00pm and Dr Delice Lesch reports she wants to hear from the wife if he had another seizure. Patient is on the floor with pillow under head. titrating off onti.down to 3 mg  Increased onti to 10mg  yesterday. zonisamide 300mg  denies injury reports just dropped in to the floor reports left hand moved over and over   Patient chronic conditions bladder cancer is on a immunotherapy for epilepsy

## 2018-08-25 NOTE — Progress Notes (Signed)
Virtual Visit via Video Note The purpose of this virtual visit is to provide medical care while limiting exposure to the novel coronavirus.    Consent was obtained for video visit:  Yes.   Answered questions that patient had about telehealth interaction:  Yes.   I discussed the limitations, risks, security and privacy concerns of performing an evaluation and management service by telemedicine. I also discussed with the patient that there may be a patient responsible charge related to this service. The patient expressed understanding and agreed to proceed.  Pt location: Home Physician Location: office Name of referring provider:  [name], MD I connected with John Holt at patients initiation/request on 08/25/2018 at  3:00 PM EDT by video enabled telemedicine application and verified that I am speaking with the correct person using two identifiers. Pt MRN:  [id number] Pt DOB:  10-29-1945 Video Participants:  John Holt;  Elouise Munroe (wife)   History of Present Illness:  The patient was last seen last month for intractable epilepsy. His wife is present for this e-visit. This is an urgent visit due to a significant increase in seizure frequency. On his last visit, clobazam dose was reduced to 5mg  qhs. He is also on Zonisamide 300mg  qhs. He had TURBT under anesthesia for bladder tumor recurrence. He went home with a catheter and slept in a separate room for 4 nights, so his wife was not sure if he had any nocturnal seizures. On 4/20, he woke up very confused. On 4/22, she witnessed a mild nocturnal seizure. On 4/23, he had his third infusion of Keytruda. On 4/26, while eating supper, he slumped down with slow left hand shaking. Head was curled over his chest. On 4/27, his wife heard a crash and found him seated on the commode with his head bent down, unresponsive. He woke up after 10 minutes and she put him to bed. They were instructed to increase clobazam back to 5mg /10mg  alternating  dose. Yesterday evening, he had another seizure where his head started drooping forward then he crashed to the floor with his left hand moving over and over for 3 minutes. She gave him an additional dose of clobazam. She swiped the VNS magnet and did not think it helped much this time. She noticed he was pale, no diaphoresis. No prior warning, he was not confused prior to passing out. It took a while to reorient, he could not talk for a second, then opened his eyes and started talking.   Today he is feeling okay. They deny any recent fever, cough/cold, urinary symptoms. A similar cluster of seizures occurred in February after TURBT. Sleep is good. He denies any headaches, dizziness, vision changes. He denies drowsiness after daytime dose of clobazam.    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 He had been seeing epileptologist Dr. Jacelyn Grip for several years, records were reviewed. He reports staring episodes in childhood. In his 35s, he recalls having episodes of a sinking feeling in his stomach with deja vu occurring around once a month initially. When he was started on seizure medications, these episodes occur rarely, around 1-2 a year. His wife started noticing possible symptoms in his 64s, they were in a meeting and she noticed him swaying back and forth but seemed unaware of it. He started having nocturnal spells more than 15 years ago, where he would have "leg thrusting" (bicycling  type leg movements), lip smacking, often followed by arousal with disorientation, need to urinate and walking around (at times resembling sleepwalking). He has had episodes where he would have a cluster of seizures then become paranoid and manic with significant post-ictal psychosis. He usually has nocturnal seizures every 3 weeks or so. His wife would give him prn clonazepam when he has clusters, and this has helped "zap" them. He  has not needed clonazepam in at least 1.5 years. He has rare daytime seizures, his wife recalls the seizures in 2006 and 2013 with post-ictal psychosis, there is note of a spell of undressing and confusion in the [employer] at [place] in 2015. In the past he has been on Lamictal, Depakote, Gabapentin. Gabapentin has been tapered off. He has been taking Onfi for several years and feels this has been the most helpful. He was recently diagnosed with bladder cancer in October 2018 and underwent resection and chemotherapy. They report that towards the end of chemo, he had some incidents with seizures. He has no recollection of the events. His wife reports that after he had his second infusion and the PICC line was taken out, he started having slight feet movements and was staring and unresponsive. He had another episode while shoveling snow, he came in and started talking and his speech became garbled, "words were not right" for 60 seconds. Three days after on the way home from chemo, he had a similar episode with his speech. He was talking about Aristotle then his speech became garbled. The last daytime event was on 04/28/17. He started swaying side to side, speech was garbled, and he was staring off for a few minutes. Due to these daytime episodes, his Onfi dose was temporarily increased to 40mg  qhs. They report he has not had any nocturnal seizures since November, which is unusual for him. The higher dose of Onfi knocks him out. They report he is done with chemotherapy and will be seeing his oncologist to discuss next steps.  He denies any headaches, dizziness, diplopia, dysarthria/dysphagia, neck/back pain, focal numbness/tingling/weakness, bowel/bladder dysfunction, no falls. He had muscle cramps, taking magnesium supplements has helped. His memory is what bothers him a lot. They mostly notice problems with his "narrative memory." He would not recall watching a movie 2-3 weeks prior. He cannot recall anecdotes. He  would make notes on an article, and later find that he had already made notes on the same article previously. He is able to remember abstract concepts from dense philosophy books he reads. His spatial memory is not good. He denies missing medications. Every once in a while he forgets his medications (twice in the past month). He drives only to the grocery and denies getting lost. He has had Neuropsychological testing, report unavailable for review.  Update 12/23/2017: On his last visit, he reported feeling bad, more depressed about his memory, and felt Onfi was contributing a lot to his symptoms. He had a seizure on 09/01/17 during wakefulness. He had a repeat MRI brain on 10/14/17 which showed asymmetric right hippocampal volume loss suggestive of mesial temporal sclerosis. He was started on Zonisamide and did very well with no seizures for 9 weeks (baseline nocturnal seizures every 2 weeks or so). He reduced Onfi to 20mg  and his wife reported he was doing great, his sense of humor was back, he was laughing, at one point his wife thought the euphoria was almost too much. She noticed this as a big difference from how he was the past couple  of years where he had a "flattening" of mood. Unfortunately, after he reduced the Onfi to 10mg  on 12/02/17, his wife noticed almost immediately that his mood was a little darker and he was more irritable. He had a nocturnal seizure on 8/9, then another on 8/14. With each seizure, his mood was going "way, way down." He was instructed in increase Zonisamide to 400mg  qhs. He then had a seizure during the afternoon of 12/18/17. He is tearful in the office today and has some difficulty finding his words, reporting it was the end of a stressful week, he had been having feelings of anxiety that week. He had no warning and is amnestic of the seizure, his wife reports he clasped his hands together in front of him and was doing repetitive digging movements, unresponsive. He then smiled at her  and grabbed her arms, jerking her around like he was dancing with her. This lasted 2-3 minutes, then he started answering questions. No focal weakness, tongue bite, or incontinence. He called our office and was instructed to increase Onfi back to 20mg  daily and Zonisamide down to 300mg  qhs. He has felt normal this week. He has had tingling in his feet with the Zonisamide, this has quieted down but not completely gone away. He denies any headaches, dizziness, vision changes, focal weakness, no falls.   Prior AEDs: Depakote, Lamictal, Tegretol, Trileptal, Keppra, Vimpat, Gabapentin  Epilepsy Risk Factors: His father had rare spells 1-2 times a year of "zonking out." He and his brother had "spasms" as babies, none after age 37/3. He recalls having a head injury in his teenage years. Otherwise he had a normal birth and early development, no history of febrile convulsions, CNS infections, or neurosurgical procedures.   Prior workup: MRI brain 01/2014: subtle findings consistent with right mesial temporal sclerosis EEGs: EEG 2009 - right temporal spike activity and right TIRDA EEG 01/2009 - normal EEG 07/2012 - normal  MRI 2006 - normal  PET Brain 2016 - right mesial temporal hypometabolism.  EMU 04/25/2014 - 05/04/2014 6 events captured on camera, 1 off camera - repeated neck extension and flexion, followed by pelvic thrusting and body rocking movements. Appear to localize to the right temporal region, although clinically suggestive of frontal onset. Patient taken off Depakote and Lamictal and switched to high dose gabapentin  Neuropsychological testing in June 2019 indicated mild neurocognitive disorder due to seizure disorder. Testing and daily functioning not consistent with dementia. Testing revealed mostly normal cognitive functioning. However, he did demonstrate mildly reduced visual-spatial construction and more impaired visual memory, consistent with right hemisphere involvement and in  particular mesial temporal lobe involvement. He also demonstrated impairment in verbal memory of non-contextual information, but memory of contextual information was intact, suggesting more of a problem with frontal-subcortical networks than hippocampal consolidation dysfunction.  Observations/Objective:   Patient is awake, alert, oriented x 3. No aphasia or dysarthria. Intact fluency and comprehension. Remote and recent memory intact. Able to name and repeat. Cranial nerves: Extraocular movements intact with no nystagmus. No facial asymmetry. Motor: moves all extremities symmetrically, at least anti-gravity x 4. No incoordination on finger to nose testing. Gait: narrow-based and steady, able to tandem walk adequately. Negative Romberg test.  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 Repeat MRI brain showed right hippocampal asymmetry suggestive of mesial temporal sclerosis. PET scan showed right mesial temporal hypometabolism. EEGs in  the past showed right temporal spikes and TIRDA, EMU admission captured seizures that appeared to localize to the right temporal area although clinically suggestive of frontal onset. He is seen urgently after a cluster of seizures that again occurred within a week of TURBT. We discussed increasing clobazam again to 10mg  daily, he would like to try spacing it out by taking 5mg  BID. Continue Zonisamide 300mg  qhs. He has a follow-up next month for VNS adjustment. They know to call for any changes.    Follow Up Instructions:   -I discussed the assessment and treatment plan with the patient. The patient was provided an opportunity to ask questions and all were answered. The patient agreed with the plan and demonstrated an understanding of the instructions.   The patient was advised to call back or seek an in-person evaluation if the symptoms worsen or if the condition  fails to improve as anticipated.    Total Time spent in visit with the patient was 25 minutes, of which more than 50% of the time was spent in counseling and/or coordinating care on the above.   Pt understands and agrees with the plan of care outlined.     Cameron Sprang, MD

## 2018-08-27 ENCOUNTER — Encounter: Payer: Self-pay | Admitting: Neurology

## 2018-09-15 ENCOUNTER — Encounter: Payer: Self-pay | Admitting: Neurology

## 2018-09-15 ENCOUNTER — Other Ambulatory Visit: Payer: Self-pay

## 2018-09-15 ENCOUNTER — Ambulatory Visit (INDEPENDENT_AMBULATORY_CARE_PROVIDER_SITE_OTHER): Payer: Medicare Other | Admitting: Neurology

## 2018-09-15 VITALS — BP 115/67 | HR 74 | Temp 98.3°F | Ht 68.0 in | Wt 135.0 lb

## 2018-09-15 DIAGNOSIS — G40219 Localization-related (focal) (partial) symptomatic epilepsy and epileptic syndromes with complex partial seizures, intractable, without status epilepticus: Secondary | ICD-10-CM

## 2018-09-15 NOTE — Progress Notes (Signed)
NEUROLOGY FOLLOW UP OFFICE NOTE  [name] [id number] May 20, 1945  HISTORY OF PRESENT ILLNESS: I had the pleasure of seeing John Holt in follow-up in the neurology clinic on 09/15/2018.  The patient was last seen a month ago for intractable epilepsy. His wife is present on video to provide additional information. He presents for an in-person visit to adjust VNS settings. On his last visit, they reported a significant increase in seizures. We discussed temporarily increasing Onfi to 5mg  BID with Zonisamide 300mg  qhs. They report that after his visit last month, they found out he had a UTI and took 2 antibiotics. He was also found to have abnormal TSH. He did not have any further seizures after 4/27, so they went back to [place] 5mg  qhs on 5/5 and he continues to do well with no seizures in the past month. Mood is good. He denies any headaches, dizziness, vision changes. No side effects on medications. He continues on [place] with his oncologist, and has recently started BCG treatment weekly for another 5 weeks.  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 He had been seeing epileptologist Dr. Jacelyn Grip for several years, records were reviewed. He reports staring episodes in childhood. In his 25s, he recalls having episodes of a sinking feeling in his stomach with deja vu occurring around once a month initially. When he was started on seizure medications, these episodes occur rarely, around 1-2 a year. His wife started noticing possible symptoms in his 33s, they were in a meeting and she noticed him swaying back and forth but seemed unaware of it. He started having nocturnal spells more than 15 years ago, where he would have "leg thrusting" (bicycling type leg movements), lip smacking, often followed by arousal with disorientation, need to urinate and walking around (at times resembling sleepwalking). He has had  episodes where he would have a cluster of seizures then become paranoid and manic with significant post-ictal psychosis. He usually has nocturnal seizures every 3 weeks or so. His wife would give him prn clonazepam when he has clusters, and this has helped "zap" them. He has not needed clonazepam in at least 1.5 years. He has rare daytime seizures, his wife recalls the seizures in 2006 and 2013 with post-ictal psychosis, there is note of a spell of undressing and confusion in the [employer] at [place] in 2015. In the past he has been on Lamictal, Depakote, Gabapentin. Gabapentin has been tapered off. He has been taking Onfi for several years and feels this has been the most helpful. He was recently diagnosed with bladder cancer in October 2018 and underwent resection and chemotherapy. They report that towards the end of chemo, he had some incidents with seizures. He has no recollection of the events. His wife reports that after he had his second infusion and the PICC line was taken out, he started having slight feet movements and was staring and unresponsive. He had another episode while shoveling snow, he came in and started talking and his speech became garbled, "words were not right" for 60 seconds. Three days after on the way home from chemo, he had a similar episode with his speech. He was talking about Aristotle then his speech became garbled. The last daytime event was on 04/28/17. He started swaying side to side, speech was garbled, and he was staring off for a few minutes. Due to these daytime episodes,  his Onfi dose was temporarily increased to 40mg  qhs. They report he has not had any nocturnal seizures since November, which is unusual for him. The higher dose of Onfi knocks him out. They report he is done with chemotherapy and will be seeing his oncologist to discuss next steps.  He denies any headaches, dizziness, diplopia, dysarthria/dysphagia, neck/back pain, focal numbness/tingling/weakness,  bowel/bladder dysfunction, no falls. He had muscle cramps, taking magnesium supplements has helped. His memory is what bothers him a lot. They mostly notice problems with his "narrative memory." He would not recall watching a movie 2-3 weeks prior. He cannot recall anecdotes. He would make notes on an article, and later find that he had already made notes on the same article previously. He is able to remember abstract concepts from dense philosophy books he reads. His spatial memory is not good. He denies missing medications. Every once in a while he forgets his medications (twice in the past month). He drives only to the grocery and denies getting lost. He has had Neuropsychological testing, report unavailable for review.  Update 12/23/2017: On his last visit, he reported feeling bad, more depressed about his memory, and felt Onfi was contributing a lot to his symptoms. He had a seizure on 09/01/17 during wakefulness. He had a repeat MRI brain on 10/14/17 which showed asymmetric right hippocampal volume loss suggestive of mesial temporal sclerosis. He was started on Zonisamide and did very well with no seizures for 9 weeks (baseline nocturnal seizures every 2 weeks or so). He reduced Onfi to 20mg  and his wife reported he was doing great, his sense of humor was back, he was laughing, at one point his wife thought the euphoria was almost too much. She noticed this as a big difference from how he was the past couple of years where he had a "flattening" of mood. Unfortunately, after he reduced the Onfi to 10mg  on 12/02/17, his wife noticed almost immediately that his mood was a little darker and he was more irritable. He had a nocturnal seizure on 8/9, then another on 8/14. With each seizure, his mood was going "way, way down." He was instructed in increase Zonisamide to 400mg  qhs. He then had a seizure during the afternoon of 12/18/17. He is tearful in the office today and has some difficulty finding his words, reporting  it was the end of a stressful week, he had been having feelings of anxiety that week. He had no warning and is amnestic of the seizure, his wife reports he clasped his hands together in front of him and was doing repetitive digging movements, unresponsive. He then smiled at her and grabbed her arms, jerking her around like he was dancing with her. This lasted 2-3 minutes, then he started answering questions. No focal weakness, tongue bite, or incontinence. He called our office and was instructed to increase Onfi back to 20mg  daily and Zonisamide down to 300mg  qhs. He has felt normal this week. He has had tingling in his feet with the Zonisamide, this has quieted down but not completely gone away. He denies any headaches, dizziness, vision changes, focal weakness, no falls.   Prior AEDs: Depakote, Lamictal, Tegretol, Trileptal, Keppra, Vimpat, Gabapentin  Epilepsy Risk Factors: His father had rare spells 1-2 times a year of "zonking out." He and his brother had "spasms" as babies, none after age 37/3. He recalls having a head injury in his teenage years. Otherwise he had a normal birth and early development, no history of febrile convulsions, CNS  infections, or neurosurgical procedures.   Prior workup: MRI brain 01/2014: subtle findings consistent with right mesial temporal sclerosis EEGs: EEG 2009 - right temporal spike activity and right TIRDA EEG 01/2009 - normal EEG 07/2012 - normal  MRI 2006 - normal  PET Brain 2016 - right mesial temporal hypometabolism.  EMU 04/25/2014 - 05/04/2014 6 events captured on camera, 1 off camera - repeated neck extension and flexion, followed by pelvic thrusting and body rocking movements. Appear to localize to the right temporal region, although clinically suggestive of frontal onset. Patient taken off Depakote and Lamictal and switched to high dose gabapentin  Neuropsychological testing in June 2019 indicated mild neurocognitive disorder due to seizure  disorder. Testing and daily functioning not consistent with dementia. Testing revealed mostly normal cognitive functioning. However, he did demonstrate mildly reduced visual-spatial construction and more impaired visual memory, consistent with right hemisphere involvement and in particular mesial temporal lobe involvement. He also demonstrated impairment in verbal memory of non-contextual information, but memory of contextual information was intact, suggesting more of a problem with frontal-subcortical networks than hippocampal consolidation dysfunction.   PAST MEDICAL HISTORY: Past Medical History:  Diagnosis Date   Bladder tumor    BPH (benign prostatic hypertrophy) with urinary obstruction    Cancer [facility])    Bladder Cancer 9563   Complication of anesthesia    severe pain after last bladder tumor resection at [facility] had to stay overnight due to pain   Frequency of urination    History of adenomatous polyp of colon    History of closed head injury    2000-- fell off ladder--  temporary memory loss resolved   History of psychosis    x2  last one documented 2006  w/ hallucination  and suicide ideation   Nocturia    Seizures ([place])    last noctural seizure 6 months ago as of 08-05-2018, no day time seizures in years seizure   Sigmoid diverticulosis    Temporal lobe epilepsy syndrome [facility]) neurologist-  dr Leta Baptist--  avergae one every 2 weeks "legs jerking around, per wife , pt unaware he's doing this"   localization-related right temporal lobe --  mostly nocturnal w/ bilateral lower extremitity movement, staring and moaning then dissorietation afterwards (last daytime seizure June 2013)   Urge urinary incontinence    resolved   Vesicoureteral reflux, bilateral    Wears glasses     MEDICATIONS: Current Outpatient Medications on File Prior to Visit  Medication Sig Dispense Refill   acetaminophen (TYLENOL) 325 MG tablet Take 650 mg by mouth every 6 (six) hours as  needed for moderate pain.      cloBAZam (ONFI) 10 MG tablet Take 1.5 tablets daily (Patient taking differently: Take 10 mg by mouth at bedtime. ) 135 tablet 3   clonazePAM (KLONOPIN) 0.5 MG tablet Take 1 tablet (0.5 mg total) by mouth daily as needed (epilepsy). 30 tablet 3   HYDROcodone-acetaminophen (NORCO/VICODIN) 5-325 MG tablet Take 1 tablet by mouth every 4 (four) hours as needed for moderate pain. 30 tablet 0   Multiple Vitamins-Minerals (PRESERVISION AREDS 2+MULTI VIT) CAPS Take 1 capsule by mouth 2 (two) times daily.     tadalafil (ADCIRCA/CIALIS) 20 MG tablet Take 20 mg by mouth daily as needed for erectile dysfunction.      tamsulosin (FLOMAX) 0.4 MG CAPS capsule Take 0.4 mg by mouth daily.     zonisamide (ZONEGRAN) 100 MG capsule Take 3 caps every night (Patient taking differently: Take 300 mg by mouth  at bedtime. ) 270 capsule 3   No current facility-administered medications on file prior to visit.     ALLERGIES: Allergies  Allergen Reactions   Chlorhexidine Rash    Full body   Povidone-Iodine Rash    FAMILY HISTORY: Family History  Problem Relation Age of Onset   Seizures Father    Heart disease Father    Cancer Father        prostat   Cancer Mother        stomach   Stomach cancer Mother    Colon cancer Neg Hx    Colon polyps Neg Hx    Esophageal cancer Neg Hx    Rectal cancer Neg Hx     SOCIAL HISTORY: Social History   Socioeconomic History   Marital status: Married    Spouse name: [name]   Number of children: 2   Years of education: PhD   Highest education level: Not on file  Occupational History    Employer: [employer]    Comment: Professor, Games developer  Social Needs   Financial resource strain: Not on file   Food insecurity:    Worry: Not on file    Inability: Not on file   Transportation needs:    Medical: Not on file    Non-medical: Not on file  Tobacco Use   Smoking status: Former Smoker    Packs/day:  1.00    Years: 15.00    Pack years: 15.00    Types: Cigarettes    Last attempt to quit: 04/03/1972    Years since quitting: 46.4   Smokeless tobacco: Never Used  Substance and Sexual Activity   Alcohol use: Yes    Alcohol/week: 14.0 standard drinks    Types: 14 Glasses of wine per week    Comment: 1 or 2  wine daily; no consumption in last 24 hours   Drug use: No   Sexual activity: Not on file  Lifestyle   Physical activity:    Days per week: Not on file    Minutes per session: Not on file   Stress: Not on file  Relationships   Social connections:    Talks on phone: Not on file    Gets together: Not on file    Attends religious service: Not on file    Active member of club or organization: Not on file    Attends meetings of clubs or organizations: Not on file    Relationship status: Not on file   Intimate partner violence:    Fear of current or ex partner: Not on file    Emotionally abused: Not on file    Physically abused: Not on file    Forced sexual activity: Not on file  Other Topics Concern   Not on file  Social History Narrative      Pt lives at home with his spouse.   Caffeine Use- 2 cups daily    REVIEW OF SYSTEMS: Constitutional: No fevers, chills, or sweats, no generalized fatigue, change in appetite Eyes: No visual changes, double vision, eye pain Ear, nose and throat: No hearing loss, ear pain, nasal congestion, sore throat Cardiovascular: No chest pain, palpitations Respiratory:  No shortness of breath at rest or with exertion, wheezes GastrointestinaI: No nausea, vomiting, diarrhea, abdominal pain, fecal incontinence Genitourinary:  No dysuria, urinary retention or frequency Musculoskeletal:  No neck pain, back pain Integumentary: No rash, pruritus, skin lesions Neurological: as above Psychiatric: No depression, insomnia, anxiety Endocrine: No palpitations, fatigue, diaphoresis,  mood swings, change in appetite, change in weight, increased  thirst Hematologic/Lymphatic:  No anemia, purpura, petechiae. Allergic/Immunologic: no itchy/runny eyes, nasal congestion, recent allergic reactions, rashes  PHYSICAL EXAM: Vitals:   09/15/18 1306  BP: 115/67  Pulse: 74  Temp: 98.3 F (36.8 C)  SpO2: 99%   General: No acute distress Head:  Normocephalic/atraumatic Neck: supple, no paraspinal tenderness, full range of motion Heart:  Regular rate and rhythm Lungs:  Clear to auscultation bilaterally Back: No paraspinal tenderness Skin/Extremities: No rash, no edema Neurological Exam: alert and oriented to person, place, and time. No aphasia or dysarthria. Fund of knowledge is appropriate.  Recent and remote memory are intact.  Attention and concentration are normal.    Able to name objects and repeat phrases. Cranial nerves: Pupils equal, round, reactive to light. Extraocular movements intact with no nystagmus. Visual fields full. Facial sensation intact. No facial asymmetry. Tongue, uvula, palate midline.  Motor: Bulk and tone normal, muscle strength 5/5 throughout with no pronator drift.  Sensation to light touch intact.  No extinction to double simultaneous stimulation.  Finger to nose testing intact.  Gait narrow-based and steady, able to tandem walk adequately.  Romberg negative.  VNS Therapy Management: Parameters Output Current (mA): 1.125 Signal Frequency (Hz): 20 Pulse Width (usec): 250 Signal ON Time (sec): 30 Signal OFF Time (min): 5 Magnet Output Current (mA): 1.375 Magnet ON Time (sec): 60 Magnet Pulse Width (usec): 250 AutoStim Output Current (mA): 1.25 AutoStim Pulse Width (usec): 250 AutoStim ON Time (sec): 30 Tachycardia Detection : On Heartbeat Detection Sensitivity: 1 Perform Verify Heartbeat Detection: yes Threshold for AutoStim (%): 20 Diagnostics Current Delievered (mA): 1.125 Lead Impedance: OK Impedence Value (Ohms): 3218 Battery Status Indicator (color): Green(75-100%)  IMPRESSION: This is a very  pleasant 73 yo RH man with a history of focal epilepsy of right mesial temporal or right mesial frontal onset. Repeat MRI brain showed right hippocampal asymmetry suggestive of mesial temporal sclerosis. PET scan showed right mesial temporal hypometabolism. EEGs in the past showed right temporal spikes and TIRDA, EMU admission captured seizures that appeared to localize to the right temporal area although clinically suggestive of frontal onset. He and his wife deny any seizures since 4/27. He presents today for VNS programming, 4 parameters changed today, output current increased to 1.125. We discussed goal to slowly increase to 1.5 mA. He has been anxious to continue with Onfi taper, however with recent increased seizure activity with his urological issues, we agreed to continue Onfi 5mg  daily and Zonisamide 300mg  qhs until he is done with BCG treatments. He will follow-up in 1 month and knows to call for any changes.   Thank you for allowing me to participate in his care.  Please do not hesitate to call for any questions or concerns.  The duration of this appointment visit was 30 minutes of face-to-face time with the patient.  Greater than 50% of this time was spent in counseling, explanation of diagnosis, planning of further management, and coordination of care.   Ellouise Newer, M.D.   CC: Dr. Dagmar Hait

## 2018-09-16 ENCOUNTER — Other Ambulatory Visit: Payer: Self-pay

## 2018-09-16 ENCOUNTER — Telehealth: Payer: Self-pay | Admitting: Neurology

## 2018-09-16 DIAGNOSIS — G40219 Localization-related (focal) (partial) symptomatic epilepsy and epileptic syndromes with complex partial seizures, intractable, without status epilepticus: Secondary | ICD-10-CM

## 2018-09-16 DIAGNOSIS — W19XXXA Unspecified fall, initial encounter: Secondary | ICD-10-CM

## 2018-09-16 NOTE — Telephone Encounter (Signed)
Patient was seen yesterday in the office. Wife called today to let us know that he fell last night in the kitchen and had a big seizure and then a little one when going to sleep. She states that patient is going through treatment for bladder cancer.  Please call patient wife she would like to speak to some ASAP

## 2018-09-16 NOTE — Telephone Encounter (Signed)
Spoke to wife and patient, he remembers cleaning kitchen then does not remember until getting ready to go to bed. She heard a crash in the kitchen, found him on the floor with some left hand shaking and was out for 5 minutes. She checked his pulse and it seemed low 55. He was trying to talk after, he comes to faster compared to his nocturnal seizures where he has more confusion. He came to and was warding her off with the magnet, no memory of it and was perfectly fine in 10 mins. When he was sleeping, he had a milder typical nocturnal seizure. Recalls waking up feeling warm, she reminds him this is the night before not last night, she states he is confused. He states he feels perfectly fine. Denies any UTI symptoms.   He has had these 2 episodes since the [place]. This is new for him, in the past his daytime episodes were more confusional than crashing to the floor. She is worried that his thyroid level being off is contributing. She will speak with Dr. Dagmar Hait. Discussed that since these episodes are different with quicker recovery, syncope is still a consideration and would do a syncope workup with echocardiogram and 30-day holter. Wife requesting Dr. Einar Gip.

## 2018-09-16 NOTE — Telephone Encounter (Signed)
Orders placed in epic

## 2018-09-16 NOTE — Telephone Encounter (Signed)
Heather, pls order echocardiogram and 30-day holter through Dr. Irven Shelling office. Thanks

## 2018-09-21 ENCOUNTER — Encounter: Payer: Self-pay | Admitting: Neurology

## 2018-09-21 MED ORDER — CLOBAZAM 10 MG PO TABS: ORAL_TABLET | ORAL | 3 refills | Status: DC

## 2018-09-21 NOTE — Patient Instructions (Signed)
1. Continue Onfi 5mg  every night and Zonisamide 300mg  every night 2. Follow-up in 1 month, call for any changes

## 2018-09-30 ENCOUNTER — Telehealth: Payer: Self-pay | Admitting: *Deleted

## 2018-09-30 NOTE — Telephone Encounter (Signed)
[employer] to ship a 30 day cardiac event monitor to your home.  They will call to confirm shipping address and insurance information.  [employer] phone number is [phone].  Monitor will be shipped to your home via [place].  Once you receive monitor, please review instructions, apply monitor, and call [employer] to send your baseline recording.  They will be able to answer any of your questions regarding the monitor and its use at that time.

## 2018-10-05 ENCOUNTER — Encounter: Payer: Self-pay | Admitting: Internal Medicine

## 2018-10-05 ENCOUNTER — Ambulatory Visit (INDEPENDENT_AMBULATORY_CARE_PROVIDER_SITE_OTHER): Payer: Medicare Other

## 2018-10-05 DIAGNOSIS — G40219 Localization-related (focal) (partial) symptomatic epilepsy and epileptic syndromes with complex partial seizures, intractable, without status epilepticus: Secondary | ICD-10-CM

## 2018-10-05 DIAGNOSIS — W19XXXA Unspecified fall, initial encounter: Secondary | ICD-10-CM | POA: Diagnosis not present

## 2018-10-05 DIAGNOSIS — R55 Syncope and collapse: Secondary | ICD-10-CM | POA: Diagnosis not present

## 2018-10-15 ENCOUNTER — Ambulatory Visit: Payer: Medicare Other | Admitting: Neurology

## 2018-10-15 ENCOUNTER — Encounter: Payer: Self-pay | Admitting: Neurology

## 2018-10-15 ENCOUNTER — Other Ambulatory Visit: Payer: Self-pay

## 2018-10-15 VITALS — BP 102/54 | HR 69 | Ht 68.0 in | Wt 136.0 lb

## 2018-10-15 DIAGNOSIS — G40219 Localization-related (focal) (partial) symptomatic epilepsy and epileptic syndromes with complex partial seizures, intractable, without status epilepticus: Secondary | ICD-10-CM | POA: Diagnosis not present

## 2018-10-15 NOTE — Progress Notes (Signed)
NEUROLOGY FOLLOW UP OFFICE NOTE  CRU KRITIKOS [id number] October 17, 1945  HISTORY OF PRESENT ILLNESS: I had the pleasure of seeing Eudell Mcphee in follow-up in the neurology clinic on 10/15/2018. The patient was last seen a month ago for intractable epilepsy. His wife is present on video to provide additional information. He presents for an in-person visit for further adjustment of VNS settings. The day after his last visit, his wife called to report a daytime episode where she heard a crash in the kitchen and found him on the floor with some left hand shaking. He was out for 5 minutes, pulse seemed low at 55 bpm. He was trying to talk after and came around faster compared to his nocturnal seizures where he has more confusion. He was perfectly fine in 10 inutes. He had a nocturnal seizure that night and 2 nocturnal seizures the night after. He did well for several weeks until he had 2 small nocturnal seizures last week. She reports the nocturnal seizures are very brief. Mood has been good. His wife reports that the seizures seemed to occur around 4 days after his Keytruda treatments. He has one more treatment for intravesical BCG next week. He currently has a cardiac monitor, the last nocturnal seizure occurred with monitor on. Mood is good. He only took the Onfi 5mg  BID for a week after the daytime seizure, and is back to taking 5mg  qhs. He is also on Zonisamide 300mg  qhs. No side effects. He denies any headaches, dizziness, vision changes.   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 He had been seeing epileptologist Dr. Jacelyn Grip for several years, records were reviewed. He reports staring episodes in childhood. In his 11s, he recalls having episodes of a sinking feeling in his stomach with deja vu occurring around once a month initially. When he was started on seizure medications, these episodes occur rarely,  around 1-2 a year. His wife started noticing possible symptoms in his 59s, they were in a meeting and she noticed him swaying back and forth but seemed unaware of it. He started having nocturnal spells more than 15 years ago, where he would have "leg thrusting" (bicycling type leg movements), lip smacking, often followed by arousal with disorientation, need to urinate and walking around (at times resembling sleepwalking). He has had episodes where he would have a cluster of seizures then become paranoid and manic with significant post-ictal psychosis. He usually has nocturnal seizures every 3 weeks or so. His wife would give him prn clonazepam when he has clusters, and this has helped "zap" them. He has not needed clonazepam in at least 1.5 years. He has rare daytime seizures, his wife recalls the seizures in 2006 and 2013 with post-ictal psychosis, there is note of a spell of undressing and confusion in the [employer] at [place] in 2015. In the past he has been on Lamictal, Depakote, Gabapentin. Gabapentin has been tapered off. He has been taking Onfi for several years and feels this has been the most helpful. He was recently diagnosed with bladder cancer in October 2018 and underwent resection and chemotherapy. They report that towards the end of chemo, he had some incidents with seizures. He has no recollection of the events. His wife reports that after he had his second infusion and the PICC line was taken out, he started having slight feet movements and was staring and unresponsive. He  had another episode while shoveling snow, he came in and started talking and his speech became garbled, "words were not right" for 60 seconds. Three days after on the way home from chemo, he had a similar episode with his speech. He was talking about Aristotle then his speech became garbled. The last daytime event was on 04/28/17. He started swaying side to side, speech was garbled, and he was staring off for a few minutes. Due to  these daytime episodes, his Onfi dose was temporarily increased to 40mg  qhs. They report he has not had any nocturnal seizures since November, which is unusual for him. The higher dose of Onfi knocks him out. They report he is done with chemotherapy and will be seeing his oncologist to discuss next steps.  He denies any headaches, dizziness, diplopia, dysarthria/dysphagia, neck/back pain, focal numbness/tingling/weakness, bowel/bladder dysfunction, no falls. He had muscle cramps, taking magnesium supplements has helped. His memory is what bothers him a lot. They mostly notice problems with his "narrative memory." He would not recall watching a movie 2-3 weeks prior. He cannot recall anecdotes. He would make notes on an article, and later find that he had already made notes on the same article previously. He is able to remember abstract concepts from dense philosophy books he reads. His spatial memory is not good. He denies missing medications. Every once in a while he forgets his medications (twice in the past month). He drives only to the grocery and denies getting lost. He has had Neuropsychological testing, report unavailable for review.  Update 12/23/2017: On his last visit, he reported feeling bad, more depressed about his memory, and felt Onfi was contributing a lot to his symptoms. He had a seizure on 09/01/17 during wakefulness. He had a repeat MRI brain on 10/14/17 which showed asymmetric right hippocampal volume loss suggestive of mesial temporal sclerosis. He was started on Zonisamide and did very well with no seizures for 9 weeks (baseline nocturnal seizures every 2 weeks or so). He reduced Onfi to 20mg  and his wife reported he was doing great, his sense of humor was back, he was laughing, at one point his wife thought the euphoria was almost too much. She noticed this as a big difference from how he was the past couple of years where he had a "flattening" of mood. Unfortunately, after he reduced the  Onfi to 10mg  on 12/02/17, his wife noticed almost immediately that his mood was a little darker and he was more irritable. He had a nocturnal seizure on 8/9, then another on 8/14. With each seizure, his mood was going "way, way down." He was instructed in increase Zonisamide to 400mg  qhs. He then had a seizure during the afternoon of 12/18/17. He is tearful in the office today and has some difficulty finding his words, reporting it was the end of a stressful week, he had been having feelings of anxiety that week. He had no warning and is amnestic of the seizure, his wife reports he clasped his hands together in front of him and was doing repetitive digging movements, unresponsive. He then smiled at her and grabbed her arms, jerking her around like he was dancing with her. This lasted 2-3 minutes, then he started answering questions. No focal weakness, tongue bite, or incontinence. He called our office and was instructed to increase Onfi back to 20mg  daily and Zonisamide down to 300mg  qhs. He has felt normal this week. He has had tingling in his feet with the Zonisamide, this has quieted  down but not completely gone away. He denies any headaches, dizziness, vision changes, focal weakness, no falls.   Prior AEDs: Depakote, Lamictal, Tegretol, Trileptal, Keppra, Vimpat, Gabapentin  Epilepsy Risk Factors: His father had rare spells 1-2 times a year of "zonking out." He and his brother had "spasms" as babies, none after age 56/3. He recalls having a head injury in his teenage years. Otherwise he had a normal birth and early development, no history of febrile convulsions, CNS infections, or neurosurgical procedures.   Prior workup: MRI brain 01/2014: subtle findings consistent with right mesial temporal sclerosis EEGs: EEG 2009 - right temporal spike activity and right TIRDA EEG 01/2009 - normal EEG 07/2012 - normal  MRI 2006 - normal  PET Brain 2016 - right mesial temporal hypometabolism.  EMU 04/25/2014  - 05/04/2014 6 events captured on camera, 1 off camera - repeated neck extension and flexion, followed by pelvic thrusting and body rocking movements. Appear to localize to the right temporal region, although clinically suggestive of frontal onset. Patient taken off Depakote and Lamictal and switched to high dose gabapentin  Neuropsychological testing in June 2019 indicated mild neurocognitive disorder due to seizure disorder. Testing and daily functioning not consistent with dementia. Testing revealed mostly normal cognitive functioning. However, he did demonstrate mildly reduced visual-spatial construction and more impaired visual memory, consistent with right hemisphere involvement and in particular mesial temporal lobe involvement. He also demonstrated impairment in verbal memory of non-contextual information, but memory of contextual information was intact, suggesting more of a problem with frontal-subcortical networks than hippocampal consolidation dysfunction.   PAST MEDICAL HISTORY: Past Medical History:  Diagnosis Date   Bladder tumor    BPH (benign prostatic hypertrophy) with urinary obstruction    Cancer [facility])    Bladder Cancer 1856   Complication of anesthesia    severe pain after last bladder tumor resection at [facility] had to stay overnight due to pain   Frequency of urination    History of adenomatous polyp of colon    History of closed head injury    2000-- fell off ladder--  temporary memory loss resolved   History of psychosis    x2  last one documented 2006  w/ hallucination  and suicide ideation   Nocturia    Seizures ([place])    last noctural seizure 6 months ago as of 08-05-2018, no day time seizures in years seizure   Sigmoid diverticulosis    Temporal lobe epilepsy syndrome [facility]) neurologist-  dr Leta Baptist--  avergae one every 2 weeks "legs jerking around, per wife , pt unaware he's doing this"   localization-related right temporal lobe --  mostly nocturnal  w/ bilateral lower extremitity movement, staring and moaning then dissorietation afterwards (last daytime seizure June 2013)   Urge urinary incontinence    resolved   UTI (urinary tract infection)    Vesicoureteral reflux, bilateral    Wears glasses     MEDICATIONS: Current Outpatient Medications on File Prior to Visit  Medication Sig Dispense Refill   acetaminophen (TYLENOL) 325 MG tablet Take 650 mg by mouth every 6 (six) hours as needed for moderate pain.      cloBAZam (ONFI) 10 MG tablet Take 1/2 tablet every night 135 tablet 3   clonazePAM (KLONOPIN) 0.5 MG tablet Take 1 tablet (0.5 mg total) by mouth daily as needed (epilepsy). 30 tablet 3   HYDROcodone-acetaminophen (NORCO/VICODIN) 5-325 MG tablet Take 1 tablet by mouth every 4 (four) hours as needed for moderate pain. (Patient not  taking: Reported on 09/15/2018) 30 tablet 0   Multiple Vitamins-Minerals (PRESERVISION AREDS 2+MULTI VIT) CAPS Take 1 capsule by mouth 2 (two) times daily.     tadalafil (ADCIRCA/CIALIS) 20 MG tablet Take 20 mg by mouth daily as needed for erectile dysfunction.      tamsulosin (FLOMAX) 0.4 MG CAPS capsule Take 0.4 mg by mouth daily.     zonisamide (ZONEGRAN) 100 MG capsule Take 3 caps every night (Patient taking differently: Take 300 mg by mouth at bedtime. ) 270 capsule 3   No current facility-administered medications on file prior to visit.     ALLERGIES: Allergies  Allergen Reactions   Chlorhexidine Rash    Full body   Povidone-Iodine Rash    FAMILY HISTORY: Family History  Problem Relation Age of Onset   Seizures Father    Heart disease Father    Cancer Father        prostat   Cancer Mother        stomach   Stomach cancer Mother    Colon cancer Neg Hx    Colon polyps Neg Hx    Esophageal cancer Neg Hx    Rectal cancer Neg Hx     SOCIAL HISTORY: Social History   Socioeconomic History   Marital status: Married    Spouse name: [name]   Number of children: 2    Years of education: PhD   Highest education level: Not on file  Occupational History    Employer: [employer]    Comment: Professor, Games developer  Social Needs   Financial resource strain: Not on file   Food insecurity    Worry: Not on file    Inability: Not on file   Transportation needs    Medical: Not on file    Non-medical: Not on file  Tobacco Use   Smoking status: Former Smoker    Packs/day: 1.00    Years: 15.00    Pack years: 15.00    Types: Cigarettes    Quit date: 04/03/1972    Years since quitting: 46.5   Smokeless tobacco: Never Used  Substance and Sexual Activity   Alcohol use: Yes    Alcohol/week: 14.0 standard drinks    Types: 14 Glasses of wine per week    Comment: 1 or 2  wine daily; no consumption in last 24 hours   Drug use: No   Sexual activity: Not on file  Lifestyle   Physical activity    Days per week: Not on file    Minutes per session: Not on file   Stress: Not on file  Relationships   Social connections    Talks on phone: Not on file    Gets together: Not on file    Attends religious service: Not on file    Active member of club or organization: Not on file    Attends meetings of clubs or organizations: Not on file    Relationship status: Not on file   Intimate partner violence    Fear of current or ex partner: Not on file    Emotionally abused: Not on file    Physically abused: Not on file    Forced sexual activity: Not on file  Other Topics Concern   Not on file  Social History Narrative      Pt lives at home with his spouse.   Caffeine Use- 2 cups daily    REVIEW OF SYSTEMS: Constitutional: No fevers, chills, or sweats, no generalized fatigue, change in  appetite Eyes: No visual changes, double vision, eye pain Ear, nose and throat: No hearing loss, ear pain, nasal congestion, sore throat Cardiovascular: No chest pain, palpitations Respiratory:  No shortness of breath at rest or with exertion,  wheezes GastrointestinaI: No nausea, vomiting, diarrhea, abdominal pain, fecal incontinence Genitourinary:  No dysuria, urinary retention or frequency Musculoskeletal:  No neck pain, back pain Integumentary: No rash, pruritus, skin lesions Neurological: as above Psychiatric: No depression, insomnia, anxiety Endocrine: No palpitations, fatigue, diaphoresis, mood swings, change in appetite, change in weight, increased thirst Hematologic/Lymphatic:  No anemia, purpura, petechiae. Allergic/Immunologic: no itchy/runny eyes, nasal congestion, recent allergic reactions, rashes  PHYSICAL EXAM: Vitals:   10/15/18 0913  BP: (!) 102/54  Pulse: 69  SpO2: 96%   General: No acute distress Head:  Normocephalic/atraumatic Neck: supple, no paraspinal tenderness, full range of motion Heart:  Regular rate and rhythm Lungs:  Clear to auscultation bilaterally Back: No paraspinal tenderness Skin/Extremities: No rash, no edema Neurological Exam: alert and oriented to person, place, and time. No aphasia or dysarthria. Fund of knowledge is appropriate.  Recent and remote memory are intact.  Attention and concentration are normal.    Able to name objects and repeat phrases. Cranial nerves: Pupils equal, round, reactive to light. Extraocular movements intact with no nystagmus. Visual fields full. Facial sensation intact. No facial asymmetry. Tongue, uvula, palate midline.  Motor: Bulk and tone normal, muscle strength 5/5 throughout with no pronator drift.  Finger to nose testing intact.  Gait narrow-based and steady, able to tandem walk adequately.  Romberg negative.  VNS Therapy Management: Parameters Output Current (mA): 1.25 Signal Frequency (Hz): 20 Pulse Width (usec): 250 Signal ON Time (sec): 30 Signal OFF Time (min): 5 Magnet Output Current (mA): 1.5 Magnet ON Time (sec): 60 Magnet Pulse Width (usec): 250 AutoStim Output Current (mA): 1.375 AutoStim Pulse Width (usec): 250 AutoStim ON Time (sec):  30 Tachycardia Detection : On Heartbeat Detection Sensitivity: 1 Perform Verify Heartbeat Detection: yes Threshold for AutoStim (%): 20 Diagnostics Current Delievered (mA): 1.25 Lead Impedance: OK Impedence Value (Ohms): 3072 Battery Status Indicator (color): Green  IMPRESSION: This is a very pleasant 73 yo RH man with a history of focal epilepsy of right mesial temporal or right mesial frontal onset. Repeat MRI brain showed right hippocampal asymmetry suggestive of mesial temporal sclerosis. PET scan showed right mesial temporal hypometabolism. EEGs in the past showed right temporal spikes and TIRDA, EMU admission captured seizures that appeared to localize to the right temporal area although clinically suggestive of frontal onset. He has had 2 daytime episodes which are atypical for his seizures, and currently has a cardiac monitor. Last nocturnal seizure was a week ago. VNS adjusted today with 3 parameters changed, output current increase to 1.25 which he tolerated. We agreed to continue Onfi 5mg  qhs for another month, continue Zonisamide 300mg  qhs. He will follow-up in 1 month and knows to call for any changes.   Thank you for allowing me to participate in his care.  Please do not hesitate to call for any questions or concerns.  The duration of this appointment visit was 25 minutes of face-to-face time with the patient.  Greater than 50% of this time was spent in counseling, explanation of diagnosis, planning of further management, and coordination of care.   Ellouise Newer, M.D.   CC: Dr. Dagmar Hait

## 2018-10-15 NOTE — Patient Instructions (Addendum)
Great seeing you! Continue Onfi 5mg  every night and Zonisamide 300mg  every night. Swipe magnet twice a day and as needed for seizure. Follow-up in 1 month, call for any changes.  Seizure Precautions: 1. If medication has been prescribed for you to prevent seizures, take it exactly as directed.  Do not stop taking the medicine without talking to your doctor first, even if you have not had a seizure in a long time.   2. Avoid activities in which a seizure would cause danger to yourself or to others.  Don't operate dangerous machinery, swim alone, or climb in high or dangerous places, such as on ladders, roofs, or girders.  Do not drive unless your doctor says you may.  3. If you have any warning that you may have a seizure, lay down in a safe place where you can't hurt yourself.    4.  No driving for 6 months from last seizure, as per [facility].   Please refer to the following link on the [facility] website for more information: http://[email or web address]/[email or web address]/[email or web address]   5.  Maintain good sleep hygiene. Avoid alcohol.  6.  Contact your doctor if you have any problems that may be related to the medicine you are taking.  7.  Call 911 and bring the patient back to the ED if:        A.  The seizure lasts longer than 5 minutes.       B.  The patient doesn't awaken shortly after the seizure  C.  The patient has new problems such as difficulty seeing, speaking or moving  D.  The patient was injured during the seizure  E.  The patient has a temperature over 102 F (39C)  F.  The patient vomited and now is having trouble breathing

## 2018-10-18 ENCOUNTER — Ambulatory Visit: Payer: Medicare Other | Admitting: Neurology

## 2018-10-19 ENCOUNTER — Telehealth: Payer: Self-pay | Admitting: Cardiology

## 2018-10-19 NOTE — Telephone Encounter (Signed)
I received a call from [place] at [facility]. They received a report of pt passed out/seizure. The monitor reportedly showed sinus rhythm at 68 bpm with no arrhythmias.  I called the patient and spoke to his wife who says that pt had a seizure today. She says he has a seizure disorder and this was one of his seizures. She came in from the other room and found him staring and confused. He had had a procedure earlier in the day- immunotherapy for cancer. This was similar to his usual seizures except that it happened during the day. His seizures usually occur at night. She does not see any need for him to go to the hospital. She just reported it because she knows that Dr. Delice Lesch is interested in whether his episodes are heart related.  The monitor tech says that strips have been sent to Dr. Tanna Furry office and Dr. Amparo Bristol office.   I will route this note to both MDs.

## 2018-10-20 NOTE — Telephone Encounter (Signed)
Thanks for the update

## 2018-10-20 NOTE — Telephone Encounter (Signed)
Duplicate encounter

## 2018-10-25 ENCOUNTER — Telehealth: Payer: Self-pay | Admitting: Internal Medicine

## 2018-10-25 NOTE — Telephone Encounter (Signed)
New Message ° ° ° °Left message to confirm appt and answer COVID questions  °

## 2018-10-25 NOTE — Telephone Encounter (Signed)
Thanks for the update

## 2018-10-25 NOTE — Telephone Encounter (Signed)
Received monitor tracing showing sinus brady with a 16.4 sec pause on 10/22/2018 at 1:15A CST.  Spoke with pt and he states he was sleeping at the time and was unaware of any sx.  Felt fine all weekend with no issues.  Wife states pt has epilepsy and possibly had seizure at the time of the event but not sure. Wife requested this information be sent to Dr. Delice Lesch as well.  Spoke with Dr. Irish Lack, [place].  He referred pt to EP.  Spoke with EP scheduler and she will reach out to pt to schedule.

## 2018-10-26 ENCOUNTER — Ambulatory Visit (INDEPENDENT_AMBULATORY_CARE_PROVIDER_SITE_OTHER): Payer: Medicare Other | Admitting: Internal Medicine

## 2018-10-26 ENCOUNTER — Other Ambulatory Visit: Payer: Self-pay

## 2018-10-26 ENCOUNTER — Encounter: Payer: Self-pay | Admitting: Internal Medicine

## 2018-10-26 DIAGNOSIS — R001 Bradycardia, unspecified: Secondary | ICD-10-CM | POA: Insufficient documentation

## 2018-10-26 NOTE — Progress Notes (Signed)
HPI John Holt is referred by Dr. Charlotte Crumb for evaluation of nocturnal bradycardia. He has a long h/o seizures for which he has had decreasing amounts of clonazapam and Zonisamide. He was diagnosed with bladder CA and has undergone surgery and chemotherapy. The patient has had both nocturnal and daytime seizures and was ordered a 30 day heart monitor due to seizure like activity which occurred and for which he seemed to recover from sooner than later. He has been found to have nocturnal pauses of up to 16 seconds. He was not awake with these. He does not have a h/o sleep apnea and does not mention that his wife has noted snoring.  Allergies  Allergen Reactions  . Chlorhexidine Rash    Full body  . Povidone-Iodine Rash     Current Outpatient Medications  Medication Sig Dispense Refill  . acetaminophen (TYLENOL) 325 MG tablet Take 650 mg by mouth every 6 (six) hours as needed for moderate pain.     . cloBAZam (ONFI) 10 MG tablet Take 1/2 tablet every night 135 tablet 3  . clonazePAM (KLONOPIN) 0.5 MG tablet Take 1 tablet (0.5 mg total) by mouth daily as needed (epilepsy). 30 tablet 3  . levothyroxine (SYNTHROID) 75 MCG tablet Take 75 mcg by mouth daily before breakfast.    . Multiple Vitamins-Minerals (PRESERVISION AREDS 2+MULTI VIT) CAPS Take 1 capsule by mouth 2 (two) times daily.    . tadalafil (ADCIRCA/CIALIS) 20 MG tablet Take 20 mg by mouth daily as needed for erectile dysfunction.     [name] zonisamide (ZONEGRAN) 100 MG capsule Take 300 mg by mouth daily.     No current facility-administered medications for this visit.      Past Medical History:  Diagnosis Date  . Bladder tumor   . BPH (benign prostatic hypertrophy) with urinary obstruction   . Cancer [facility])    Bladder Cancer 2018  . Complication of anesthesia    severe pain after last bladder tumor resection at [place] had to stay overnight due to pain  . Frequency of urination   . History of adenomatous polyp of colon    . History of closed head injury    2000-- fell off ladder--  temporary memory loss resolved  . History of psychosis    x2  last one documented 2006  w/ hallucination  and suicide ideation  . Nocturia   . Seizures ([place])    last noctural seizure 6 months ago as of 08-05-2018, no day time seizures in years seizure  . Sigmoid diverticulosis   . Temporal lobe epilepsy syndrome [facility]) neurologist-  dr Leta Baptist--  Linden Dolin one every 2 weeks "legs jerking around, per wife , pt unaware he's doing this"   localization-related right temporal lobe --  mostly nocturnal w/ bilateral lower extremitity movement, staring and moaning then dissorietation afterwards (last daytime seizure June 2013)  . Urge urinary incontinence    resolved  . UTI (urinary tract infection)   . Vesicoureteral reflux, bilateral   . Wears glasses     ROS:   All systems reviewed and negative except as noted in the HPI.   Past Surgical History:  Procedure Laterality Date  . COLONOSCOPY    . COLONOSCOPY W/ POLYPECTOMY  09-11-2009  . CYSTOSCOPY W/ RETROGRADES Bilateral 08/13/2018   Procedure: CYSTOSCOPY WITH RETROGRADE PYELOGRAM;  Surgeon: Cleon Gustin, MD;  [facility];  Service: Urology;  Laterality: Bilateral;  . GREEN LIGHT LASER TURP (TRANSURETHRAL RESECTION OF PROSTATE N/A  04/07/2014   Procedure: GREEN LIGHT LASER TURP (TRANSURETHRAL RESECTION OF PROSTATE;  Surgeon: Ailene Rud, MD;  Location: [facility];  Service: Urology;  Laterality: N/A;  . INGUINAL HERNIA REPAIR Bilateral 04/15/2013   Procedure: LAPAROSCOPIC BILATERAL INGUINAL HERNIA REPAIR;  Surgeon: Gayland Curry, MD;  [facility];  Service: General;  Laterality: Bilateral;  . INSERTION OF MESH N/A 04/15/2013   Procedure: INSERTION OF MESH;  Surgeon: Gayland Curry, MD;  [facility];  Service: General;  Laterality: N/A;  . POLYPECTOMY    . TRANSURETHRAL RESECTION OF BLADDER  2018  . TRANSURETHRAL RESECTION OF BLADDER  TUMOR N/A 08/13/2018   Procedure: TRANSURETHRAL RESECTION OF BLADDER TUMOR (TURBT);  Surgeon: Cleon Gustin, MD;  [facility];  Service: Urology;  Laterality: N/A;  1 HR  . UMBILICAL HERNIA REPAIR N/A 04/15/2013   Procedure: OPEN HERNIA REPAIR UMBILICAL ADULT;  Surgeon: Gayland Curry, MD;  [facility];  Service: General;  Laterality: N/A;  . VAGUS NERVE STIMULATOR INSERTION Left 01/28/2018   Procedure: VAGAL NERVE STIMULATOR PLACEMENT;  Surgeon: Consuella Lose, MD;  Location: [place];  Service: Neurosurgery;  Laterality: Left;  VAGAL NERVE STIMULATOR PLACEMENT     Family History  Problem Relation Age of Onset  . Seizures Father   . Heart disease Father   . Cancer Father        prostat  . Cancer Mother        stomach  . Stomach cancer Mother   . Colon cancer Neg Hx   . Colon polyps Neg Hx   . Esophageal cancer Neg Hx   . Rectal cancer Neg Hx      Social History   Socioeconomic History  . Marital status: Married    Spouse name: John Holt  . Number of children: 2  . Years of education: PhD  . Highest education level: Not on file  Occupational History    Employer: [employer]    Comment: Professor, [employer] department  Social Needs  . Financial resource strain: Not on file  . Food insecurity    Worry: Not on file    Inability: Not on file  . Transportation needs    Medical: Not on file    Non-medical: Not on file  Tobacco Use  . Smoking status: Former Smoker    Packs/day: 1.00    Years: 15.00    Pack years: 15.00    Types: Cigarettes    Quit date: 04/03/1972    Years since quitting: 46.5  . Smokeless tobacco: Never Used  Substance and Sexual Activity  . Alcohol use: Yes    Alcohol/week: 14.0 standard drinks    Types: 14 Glasses of wine per week    Comment: 1 or 2  wine daily; no consumption in last 24 hours  . Drug use: No  . Sexual activity: Not on file  Lifestyle  . Physical activity    Days per week: Not on file    Minutes per session: Not on  file  . Stress: Not on file  Relationships  . Social Herbalist on phone: Not on file    Gets together: Not on file    Attends religious service: Not on file    Active member of club or organization: Not on file    Attends meetings of clubs or organizations: Not on file    Relationship status: Not on file  . Intimate partner violence    Fear  of current or ex partner: Not on file    Emotionally abused: Not on file    Physically abused: Not on file    Forced sexual activity: Not on file  Other Topics Concern  . Not on file  Social History Narrative      Pt lives at home with his spouse.   Caffeine Use- 2 cups daily     BP 112/64   Pulse (!) 55   Ht 5\' 8"  (1.727 m)   Wt 138 lb 6.4 oz (62.8 kg)   SpO2 98%   BMI 21.04 kg/m   Physical Exam:  Well appearing NAD HEENT: Unremarkable Neck:  No JVD, no thyromegally Lymphatics:  No adenopathy Back:  No CVA tenderness Lungs:  Clear with no wheezes HEART:  Regular rate rhythm, no murmurs, no rubs, no clicks Abd:  soft, positive bowel sounds, no organomegally, no rebound, no guarding Ext:  2 plus pulses, no edema, no cyanosis, no clubbing Skin:  No rashes no nodules Neuro:  CN II through XII intact, motor grossly intact  EKG - NSR  Cardiac monitor - reviewed  Assess/Plan: 1. Nocturnal pauses - the signficance of these is unclear. We do not place PPMs for pauses at nightime. In my experience, sleep apnea is the most common cause of these. His habitus does not suggest obstructive sleep apnea but he could be having central sleep apnea. I would recommend a sleep study.  2. Seizures - it is unusual that he has nighttime seizures while he is sleeping. His nocturnal seizures could be due to the pauses. I offered him insertion of an ILR if his 30 day monitor does not show a daytime bradycardia.  3. Bladder CA - the patient thinks that his seizures worsened after developing thyroid dysfunction after chemotherapy.   Mikle Bosworth.D.

## 2018-10-26 NOTE — Patient Instructions (Addendum)
Medication Instructions:  Your physician recommends that you continue on your current medications as directed. Please refer to the Current Medication list given to you today.  Labwork: None ordered.  Testing/Procedures: None ordered.  Follow-Up: Your physician wants you to follow-up in: as needed with Dr. Taylor.      Any Other Special Instructions Will Be Listed Below (If Applicable).  If you need a refill on your cardiac medications before your next appointment, please call your pharmacy.   

## 2018-11-11 ENCOUNTER — Telehealth: Payer: Self-pay | Admitting: *Deleted

## 2018-11-11 NOTE — Telephone Encounter (Addendum)
Called to report her husband has gotten "bad news". The bladder cancer is back and it looks bad. Asking if Dr. Alen Blew would still be willing to see him as a new patient? Called wife back and informed her that Dr. Benay Spice talked with Dr. Alen Blew, who agrees to see him. Will send message to new patient coordinator to call them with appointment. His records are at [facility] w/Dr. Nunzio Cobbs.

## 2018-11-15 ENCOUNTER — Other Ambulatory Visit: Payer: Self-pay | Admitting: Internal Medicine

## 2018-11-15 ENCOUNTER — Other Ambulatory Visit: Payer: Self-pay | Admitting: Urology

## 2018-11-15 ENCOUNTER — Other Ambulatory Visit: Payer: Self-pay

## 2018-11-15 DIAGNOSIS — G40219 Localization-related (focal) (partial) symptomatic epilepsy and epileptic syndromes with complex partial seizures, intractable, without status epilepticus: Secondary | ICD-10-CM

## 2018-11-15 DIAGNOSIS — R55 Syncope and collapse: Secondary | ICD-10-CM

## 2018-11-15 DIAGNOSIS — W19XXXA Unspecified fall, initial encounter: Secondary | ICD-10-CM

## 2018-11-17 ENCOUNTER — Inpatient Hospital Stay: Payer: Medicare Other | Attending: Oncology | Admitting: Oncology

## 2018-11-17 ENCOUNTER — Other Ambulatory Visit: Payer: Self-pay

## 2018-11-17 ENCOUNTER — Telehealth: Payer: Self-pay | Admitting: Oncology

## 2018-11-17 VITALS — BP 126/74 | HR 91 | Temp 98.9°F | Resp 18 | Ht 68.0 in | Wt 138.6 lb

## 2018-11-17 DIAGNOSIS — C674 Malignant neoplasm of posterior wall of bladder: Secondary | ICD-10-CM

## 2018-11-17 NOTE — Telephone Encounter (Signed)
Gave avs and calendar ° °

## 2018-11-17 NOTE — Progress Notes (Signed)
Reason for the request:   Bladder cancer  HPI: I was asked by Dr. Alyson Ingles to evaluate John Holt for the management of bladder cancer.  He is a 73 year old man with history of seizure disorder among few other comorbid conditions.  He was diagnosed with bladder cancer dating back in 2018.  At that time he was found to have posterior bladder tumor and TURBT and October 2018 showed a 6 to 7 cm mass that showed high-grade urothelial carcinoma with sarcomatoid differentiation without any muscle invasion.  Based on these findings, he underwent neoadjuvant chemotherapy in preparation for radical cystectomy utilizing MVAC for 4 cycles under the care of Dr. Marcello Moores at [facility].  He subsequently declined cystectomy for personal reasons.  He remained disease free till January 2020 where he has recurrence of non-muscle invasive disease.  He was started on Pembrolizumab in March 2020 after declining cystectomy.  Repeat TURBT in April 2020 showed persistent high-grade noninvasive papillary urothelial carcinoma.  He underwent BCG treatment in May 2020 under the care of Dr. Alyson Ingles.  His disease relapsed based on evaluation by Dr. Alyson Ingles although the exact pathology of that recurrence is unknown.  He is here to establish care locally where he wants to continue his cancer treatment.  Clinically, he reports no specific urinary symptoms at this time.  Denies any hematuria or dysuria.  He has lost some weight over the process of treating his cancer and have had memory issues that are unrelated.  He is still ambulatory although decides not to drive because of his epilepsy.  He denies any specific pain or discomfort.  He does not report any headaches, blurry vision, syncope or seizures. Does not report any fevers, chills or sweats.  Does not report any cough, wheezing or hemoptysis.  Does not report any chest pain, palpitation, orthopnea or leg edema.  Does not report any nausea, vomiting or abdominal  pain.  Does not report any constipation or diarrhea.  Does not report any skeletal complaints.    Does not report frequency, urgency or hematuria.  Does not report any skin rashes or lesions. Does not report any heat or cold intolerance.  Does not report any lymphadenopathy or petechiae.  Does not report any anxiety or depression.  Remaining review of systems is negative.    Past Medical History:  Diagnosis Date  . Bladder tumor   . BPH (benign prostatic hypertrophy) with urinary obstruction   . Cancer [facility])    Bladder Cancer 2018  . Complication of anesthesia    severe pain after last bladder tumor resection at [place] had to stay overnight due to pain  . Frequency of urination   . History of adenomatous polyp of colon   . History of closed head injury    2000-- fell off ladder--  temporary memory loss resolved  . History of psychosis    x2  last one documented 2006  w/ hallucination  and suicide ideation  . Nocturia   . Seizures ([place])    last noctural seizure 6 months ago as of 08-05-2018, no day time seizures in years seizure  . Sigmoid diverticulosis   . Temporal lobe epilepsy syndrome [facility]) neurologist-  dr Leta Baptist--  Linden Dolin one every 2 weeks "legs jerking around, per wife , pt unaware he's doing this"   localization-related right temporal lobe --  mostly nocturnal w/ bilateral lower extremitity movement, staring and moaning then dissorietation afterwards (last daytime seizure June 2013)  . Urge urinary incontinence  resolved  . UTI (urinary tract infection)   . Vesicoureteral reflux, bilateral   . Wears glasses   :  Past Surgical History:  Procedure Laterality Date  . COLONOSCOPY    . COLONOSCOPY W/ POLYPECTOMY  09-11-2009  . CYSTOSCOPY W/ RETROGRADES Bilateral 08/13/2018   Procedure: CYSTOSCOPY WITH RETROGRADE PYELOGRAM;  Surgeon: Cleon Gustin, MD;  [facility];  Service: Urology;  Laterality: Bilateral;  . GREEN LIGHT LASER TURP (TRANSURETHRAL RESECTION  OF PROSTATE N/A 04/07/2014   Procedure: GREEN LIGHT LASER TURP (TRANSURETHRAL RESECTION OF PROSTATE;  Surgeon: Ailene Rud, MD;  Location[facility];  Service: Urology;  Laterality: N/A;  . INGUINAL HERNIA REPAIR Bilateral 04/15/2013   Procedure: LAPAROSCOPIC BILATERAL INGUINAL HERNIA REPAIR;  Surgeon: Gayland Curry, MD;  [facility];  Service: General;  Laterality: Bilateral;  . INSERTION OF MESH N/A 04/15/2013   Procedure: INSERTION OF MESH;  Surgeon: Gayland Curry, MD;  [facility];  Service: General;  Laterality: N/A;  . POLYPECTOMY    . TRANSURETHRAL RESECTION OF BLADDER  2018  . TRANSURETHRAL RESECTION OF BLADDER TUMOR N/A 08/13/2018   Procedure: TRANSURETHRAL RESECTION OF BLADDER TUMOR (TURBT);  Surgeon: Cleon Gustin, MD;  [facility];  Service: Urology;  Laterality: N/A;  1 HR  . UMBILICAL HERNIA REPAIR N/A 04/15/2013   Procedure: OPEN HERNIA REPAIR UMBILICAL ADULT;  Surgeon: Gayland Curry, MD;  [facility];  Service: General;  Laterality: N/A;  . VAGUS NERVE STIMULATOR INSERTION Left 01/28/2018   Procedure: VAGAL NERVE STIMULATOR PLACEMENT;  Surgeon: Consuella Lose, MD;  Location: [place];  Service: Neurosurgery;  Laterality: Left;  VAGAL NERVE STIMULATOR PLACEMENT  :   Current Outpatient Medications:  .  acetaminophen (TYLENOL) 325 MG tablet, Take 650 mg by mouth every 6 (six) hours as needed for moderate pain. , Disp: , Rfl:  .  cloBAZam (ONFI) 10 MG tablet, Take 1/2 tablet every night, Disp: 135 tablet, Rfl: 3 .  clonazePAM (KLONOPIN) 0.5 MG tablet, Take 1 tablet (0.5 mg total) by mouth daily as needed (epilepsy)., Disp: 30 tablet, Rfl: 3 .  levothyroxine (SYNTHROID) 75 MCG tablet, Take 75 mcg by mouth daily before breakfast., Disp: , Rfl:  .  Multiple Vitamins-Minerals (PRESERVISION AREDS 2+MULTI VIT) CAPS, Take 1 capsule by mouth 2 (two) times daily., Disp: , Rfl:  .  tadalafil (ADCIRCA/CIALIS) 20 MG tablet, Take 20 mg by mouth  daily as needed for erectile dysfunction. , Disp: , Rfl:  .  zonisamide (ZONEGRAN) 100 MG capsule, Take 300 mg by mouth daily., Disp: , Rfl: :  Allergies  Allergen Reactions  . Chlorhexidine Rash    Full body  . Povidone-Iodine Rash  :  Family History  Problem Relation Age of Onset  . Seizures Father   . Heart disease Father   . Cancer Father        prostat  . Cancer Mother        stomach  . Stomach cancer Mother   . Colon cancer Neg Hx   . Colon polyps Neg Hx   . Esophageal cancer Neg Hx   . Rectal cancer Neg Hx   :  Social History   Socioeconomic History  . Marital status: Married    Spouse name: [name]  . Number of children: 2  . Years of education: PhD  . Highest education level: Not on file  Occupational History    Employer: [employer]    Comment: Professor, Games developer  Social Needs  . Financial resource strain: Not on file  . Food insecurity    Worry: Not on file    Inability: Not on file  . Transportation needs    Medical: Not on file    Non-medical: Not on file  Tobacco Use  . Smoking status: Former Smoker    Packs/day: 1.00    Years: 15.00    Pack years: 15.00    Types: Cigarettes    Quit date: 04/03/1972    Years since quitting: 46.6  . Smokeless tobacco: Never Used  Substance and Sexual Activity  . Alcohol use: Yes    Alcohol/week: 14.0 standard drinks    Types: 14 Glasses of wine per week    Comment: 1 or 2  wine daily; no consumption in last 24 hours  . Drug use: No  . Sexual activity: Not on file  Lifestyle  . Physical activity    Days per week: Not on file    Minutes per session: Not on file  . Stress: Not on file  Relationships  . Social Herbalist on phone: Not on file    Gets together: Not on file    Attends religious service: Not on file    Active member of club or organization: Not on file    Attends meetings of clubs or organizations: Not on file    Relationship status: Not on file  . Intimate partner  violence    Fear of current or ex partner: Not on file    Emotionally abused: Not on file    Physically abused: Not on file    Forced sexual activity: Not on file  Other Topics Concern  . Not on file  Social History Narrative      Pt lives at home with his spouse.   Caffeine Use- 2 cups daily  :  Pertinent items are noted in HPI.  Exam: Blood pressure 126/74, pulse 91, temperature 98.9 F (37.2 C), temperature source Oral, resp. rate 18, height 5\' 8"  (1.727 m), weight 138 lb 9.6 oz (62.9 kg), SpO2 96 %.  ECOG 1 General appearance: alert and cooperative appeared without distress. Head: atraumatic without any abnormalities. Eyes: conjunctivae/corneas clear. PERRL.  Sclera anicteric. Throat: lips, mucosa, and tongue normal; without oral thrush or ulcers. Resp: clear to auscultation bilaterally without rhonchi, wheezes or dullness to percussion. Cardio: regular rate and rhythm, S1, S2 normal, no murmur, click, rub or gallop GI: soft, non-tender; bowel sounds normal; no masses,  no organomegaly Skin: Skin color, texture, turgor normal. No rashes or lesions Lymph nodes: Cervical, supraclavicular, and axillary nodes normal. Neurologic: Grossly normal without any motor, sensory or deep tendon reflexes. Musculoskeletal: No joint deformity or effusion.  CBC    Component Value Date/Time   WBC 6.2 08/14/2018 1803   RBC 4.77 08/14/2018 1803   HGB 14.0 08/14/2018 1803   HCT 43.7 08/14/2018 1803   PLT 164 08/14/2018 1803   MCV 91.6 08/14/2018 1803   MCH 29.4 08/14/2018 1803   MCHC 32.0 08/14/2018 1803   RDW 14.2 08/14/2018 1803   LYMPHSABS 0.6 (L) 08/14/2018 1803   MONOABS 0.8 08/14/2018 1803   EOSABS 0.2 08/14/2018 1803   BASOSABS 0.0 08/14/2018 1803     Chemistry      Component Value Date/Time   NA 140 08/14/2018 1803   K 3.9 08/14/2018 1803   CL 104 08/14/2018 1803   CO2 23 08/14/2018 1803   BUN 13 08/14/2018 1803   CREATININE  0.95 08/14/2018 1803      Component Value  Date/Time   CALCIUM 9.9 08/14/2018 1803       Assessment and Plan:   73 year old man with:  1.  Bladder cancer diagnosed in 2018.  He presented with aggressive disease with sarcomatoid features although it was not clear that he had muscle invasive disease at that time.  It was presumed that he did then underwent an neoadjuvant chemotherapy utilizing MVAC for 4 cycles.  He declined cystectomy after that and remains disease-free till January 2020.  He presented with non-muscle invasive disease that was treated with Pembrolizumab and BCG and continues to be refractory.  He is scheduled to have a TURBT under the care of Dr. Alyson Ingles the first week of August.  The natural course of this disease was reviewed today and treatment options were outlined.  The first therapist to repeat his biopsy and assess the degree of his disease relapse.  If we are dealing with refractory non-muscle invasive disease treatment options are limited at this time.  He has received his BCG and Pembrolizumab remain refractory.  Intravesicular chemotherapy with gemcitabine or mitomycin would be an option for salvage purposes but the benefit would be marginal.  If he developed muscle invasive disease then different treatment approach may be needed including radiation therapy with systemic chemotherapy at that time.  The option of radical cystectomy has been eliminated based on personal preferences and quality of life issues.  Repeat cystoscopies and TURBT would be is only palliative option moving forward if he continues to decline radical cystectomy for non-muscle invasive disease.  After discussion today, the plan is to await his upcoming TURBT schedule a virtual visit where his wife will be available of the discussion and answering any other questions.  2.  Follow-up: We will be in the next few weeks following his surgery discuss future steps.  60  minutes was spent with the patient face-to-face today.  More than 50% of time  was spent on reviewing his disease status, reviewing treatment options, complication related therapy answering questions regarding future plan of care.   Thank you for the referral.  A copy of this consult has been forwarded to the requesting physician.

## 2018-11-24 ENCOUNTER — Encounter: Payer: Self-pay | Admitting: Neurology

## 2018-11-24 ENCOUNTER — Ambulatory Visit (INDEPENDENT_AMBULATORY_CARE_PROVIDER_SITE_OTHER): Payer: Medicare Other | Admitting: Neurology

## 2018-11-24 ENCOUNTER — Other Ambulatory Visit: Payer: Self-pay

## 2018-11-24 VITALS — BP 113/66 | HR 73 | Ht 68.0 in | Wt 136.4 lb

## 2018-11-24 DIAGNOSIS — G40219 Localization-related (focal) (partial) symptomatic epilepsy and epileptic syndromes with complex partial seizures, intractable, without status epilepticus: Secondary | ICD-10-CM | POA: Diagnosis not present

## 2018-11-24 MED ORDER — ZONISAMIDE 100 MG PO CAPS: ORAL_CAPSULE | ORAL | 3 refills | Status: DC

## 2018-11-24 MED ORDER — CLOBAZAM 10 MG PO TABS: ORAL_TABLET | ORAL | 3 refills | Status: DC

## 2018-11-24 NOTE — Patient Instructions (Signed)
1. Continue Zonisamide 100mg : take 3 capsules every night  2. Continue Onfi 10mg : Take 1/2 tablet every night  3. Continue to swipe magnet twice a day  4. Follow-up in 3 months, call for any changes  Seizure Precautions: 1. If medication has been prescribed for you to prevent seizures, take it exactly as directed.  Do not stop taking the medicine without talking to your doctor first, even if you have not had a seizure in a long time.   2. Avoid activities in which a seizure would cause danger to yourself or to others.  Don't operate dangerous machinery, swim alone, or climb in high or dangerous places, such as on ladders, roofs, or girders.  Do not drive unless your doctor says you may.  3. If you have any warning that you may have a seizure, lay down in a safe place where you can't hurt yourself.    4.  No driving for 6 months from last seizure, as per [facility].   Please refer to the following link on the [place] website for more information: http://[email or web address].org/[email or web address]/driving/drivingu.cfm   5.  Maintain good sleep hygiene. Avoid alcohol.  6.  Contact your doctor if you have any problems that may be related to the medicine you are taking.  7.  Call 911 and bring the patient back to the ED if:        A.  The seizure lasts longer than 5 minutes.       B.  The patient doesn't awaken shortly after the seizure  C.  The patient has new problems such as difficulty seeing, speaking or moving  D.  The patient was injured during the seizure  E.  The patient has a temperature over 102 F (39C)  F.  The patient vomited and now is having trouble breathing

## 2018-11-24 NOTE — Progress Notes (Signed)
NEUROLOGY FOLLOW UP OFFICE NOTE  John Holt [id number] April 10, 1946  HISTORY OF PRESENT ILLNESS: I had the pleasure of seeing John Holt in follow-up in the neurology clinic on 11/24/2018. The patient was last seen a month ago for intractable epilepsy. His wife is present to provide additional history. Since his last visit, he has seen his urologist and oncologist and unfortunately despite treatment with BCG and Keytruda, a new bladder tumor was found. He will no longer continue Keytruda. They are now seeking a second opinion at the [facility] at the end of August. He has seen his PCP and was started on Synthroid 2 days ago. He has kindly been evaluated by Cardiology and had a 30-day holter which noted nocturnal pauses of up to 16 seconds, significance unclear, may be seen with sleep apnea, sleep study was recommended. ILR insertion was also offered. Since his last visit, his wife has contacted [place] about 2 seizures, one on 7/16 when she found him in the back steps slumped over, he was responsive but did not know where he was. His BP was 116/70, HR 75. He had another small one the next morning on 7/16. He is on Zonisamide 300mg  qhs and clobazam 10mg  1/2 tab qhs without side effects. His wife feels since the 2 seizures he has been more slow and sluggish, but he does not feel this way. 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 He and his wife do not feel he is depressed. His wife feels that since VNS placement, his seizures have been so mild, which is a great improvement. He denies any headaches, dizziness, vision changes.   History on Initial Assessment 05/13/2017: This is a very pleasant 73 yo RH man with a history of focal epilepsy of right mesial temporal or right mesial frontal onset. He had been seeing epileptologist Dr. Jacelyn Grip for several years, records were reviewed. He reports staring episodes in childhood. In his 71s, he recalls having episodes of  a sinking feeling in his stomach with deja vu occurring around once a month initially. When he was started on seizure medications, these episodes occur rarely, around 1-2 a year. His wife started noticing possible symptoms in his 75s, they were in a meeting and she noticed him swaying back and forth but seemed unaware of it. He started having nocturnal spells more than 15 years ago, where he would have "leg thrusting" (bicycling type leg movements), lip smacking, often followed by arousal with disorientation, need to urinate and walking around (at times resembling sleepwalking). He has had episodes where he would have a cluster of seizures then become paranoid and manic with significant post-ictal psychosis. He usually has nocturnal seizures every 75 weeks or so. His wife would give him prn clonazepam when he has clusters, and this has helped "zap" them. He has not needed clonazepam in at least 1.5 years. He has rare daytime seizures, his wife recalls the seizures in 2006 and 2013 with post-ictal psychosis, there is note of a spell of undressing and confusion in the [employer] at [place] in 2015. In the past he has been on Lamictal, Depakote, Gabapentin. Gabapentin has been tapered off. He has been taking Onfi for several years and feels this has been the most helpful. He was recently diagnosed with bladder cancer in October 2018 and underwent resection and chemotherapy. They report that towards the end of chemo, he had some incidents with seizures. He has no  recollection of the events. His wife reports that after he had his second infusion and the PICC line was taken out, he started having slight feet movements and was staring and unresponsive. He had another episode while shoveling snow, he came in and started talking and his speech became garbled, "words were not right" for 60 seconds. Three days after on the way home from chemo, he had a similar episode with his speech. He was talking about Aristotle then his  speech became garbled. The last daytime event was on 04/28/17. He started swaying side to side, speech was garbled, and he was staring off for a few minutes. Due to these daytime episodes, his Onfi dose was temporarily increased to 40mg  qhs. They report he has not had any nocturnal seizures since November, which is unusual for him. The higher dose of Onfi knocks him out. They report he is done with chemotherapy and will be seeing his oncologist to discuss next steps.  He denies any headaches, dizziness, diplopia, dysarthria/dysphagia, neck/back pain, focal numbness/tingling/weakness, bowel/bladder dysfunction, no falls. He had muscle cramps, taking magnesium supplements has helped. His memory is what bothers him a lot. They mostly notice problems with his "narrative memory." He would not recall watching a movie 2-3 weeks prior. He cannot recall anecdotes. He would make notes on an article, and later find that he had already made notes on the same article previously. He is able to remember abstract concepts from dense philosophy books he reads. His spatial memory is not good. He denies missing medications. Every once in a while he forgets his medications (twice in the past month). He drives only to the grocery and denies getting lost. He has had Neuropsychological testing, report unavailable for review.  Update 12/23/2017: On his last visit, he reported feeling bad, more depressed about his memory, and felt Onfi was contributing a lot to his symptoms. He had a seizure on 09/01/17 during wakefulness. He had a repeat MRI brain on 10/14/17 which showed asymmetric right hippocampal volume loss suggestive of mesial temporal sclerosis. He was started on Zonisamide and did very well with no seizures for 9 weeks (baseline nocturnal seizures every 2 weeks or so). He reduced Onfi to 20mg  and his wife reported he was doing great, his sense of humor was back, he was laughing, at one point his wife thought the euphoria was almost  too much. She noticed this as a big difference from how he was the past couple of years where he had a "flattening" of mood. Unfortunately, after he reduced the Onfi to 10mg  on 12/02/17, his wife noticed almost immediately that his mood was a little darker and he was more irritable. He had a nocturnal seizure on 8/9, then another on 8/14. With each seizure, his mood was going "way, way down." He was instructed in increase Zonisamide to 400mg  qhs. He then had a seizure during the afternoon of 12/18/17. He is tearful in the office today and has some difficulty finding his words, reporting it was the end of a stressful week, he had been having feelings of anxiety that week. He had no warning and is amnestic of the seizure, his wife reports he clasped his hands together in front of him and was doing repetitive digging movements, unresponsive. He then smiled at her and grabbed her arms, jerking her around like he was dancing with her. This lasted 2-3 minutes, then he started answering questions. No focal weakness, tongue bite, or incontinence. He called our office and was  instructed to increase Onfi back to 20mg  daily and Zonisamide down to 300mg  qhs. He has felt normal this week. He has had tingling in his feet with the Zonisamide, this has quieted down but not completely gone away. He denies any headaches, dizziness, vision changes, focal weakness, no falls.   Prior AEDs: Depakote, Lamictal, Tegretol, Trileptal, Keppra, Vimpat, Gabapentin  Epilepsy Risk Factors: His father had rare spells 1-2 times a year of "zonking out." He and his brother had "spasms" as babies, none after age 92/3. He recalls having a head injury in his teenage years. Otherwise he had a normal birth and early development, no history of febrile convulsions, CNS infections, or neurosurgical procedures.   Prior workup: MRI brain 01/2014: subtle findings consistent with right mesial temporal sclerosis EEGs: EEG 2009 - right temporal spike  activity and right TIRDA EEG 01/2009 - normal EEG 07/2012 - normal  MRI 2006 - normal  PET Brain 2016 - right mesial temporal hypometabolism.  EMU 04/25/2014 - 05/04/2014 6 events captured on camera, 1 off camera - repeated neck extension and flexion, followed by pelvic thrusting and body rocking movements. Appear to localize to the right temporal region, although clinically suggestive of frontal onset. Patient taken off Depakote and Lamictal and switched to high dose gabapentin  Neuropsychological testing in June 2019 indicated mild neurocognitive disorder due to seizure disorder. Testing and daily functioning not consistent with dementia. Testing revealed mostly normal cognitive functioning. However, he did demonstrate mildly reduced visual-spatial construction and more impaired visual memory, consistent with right hemisphere involvement and in particular mesial temporal lobe involvement. He also demonstrated impairment in verbal memory of non-contextual information, but memory of contextual information was intact, suggesting more of a problem with frontal-subcortical networks than hippocampal consolidation dysfunction.   PAST MEDICAL HISTORY: Past Medical History:  Diagnosis Date  . Bladder tumor   . BPH (benign prostatic hypertrophy) with urinary obstruction   . Cancer [facility])    Bladder Cancer 2018  . Complication of anesthesia    severe pain after last bladder tumor resection at [place] had to stay overnight due to pain  . Frequency of urination   . History of adenomatous polyp of colon   . History of closed head injury    2000-- fell off ladder--  temporary memory loss resolved  . History of psychosis    x2  last one documented 2006  w/ hallucination  and suicide ideation  . Nocturia   . Seizures ([place])    last noctural seizure 6 months ago as of 08-05-2018, no day time seizures in years seizure  . Sigmoid diverticulosis   . Temporal lobe epilepsy syndrome [facility]) neurologist-   dr Leta Baptist--  Linden Dolin one every 2 weeks "legs jerking around, per wife , pt unaware he's doing this"   localization-related right temporal lobe --  mostly nocturnal w/ bilateral lower extremitity movement, staring and moaning then dissorietation afterwards (last daytime seizure June 2013)  . Urge urinary incontinence    resolved  . UTI (urinary tract infection)   . Vesicoureteral reflux, bilateral   . Wears glasses     MEDICATIONS: Current Outpatient Medications on File Prior to Visit  Medication Sig Dispense Refill  . acetaminophen (TYLENOL) 325 MG tablet Take 650 mg by mouth every 6 (six) hours as needed for moderate pain.     . cloBAZam (ONFI) 10 MG tablet Take 1/2 tablet every night 135 tablet 3  . clonazePAM (KLONOPIN) 0.5 MG tablet Take 1 tablet (0.5 mg  total) by mouth daily as needed (epilepsy). 30 tablet 3  . levothyroxine (SYNTHROID) 75 MCG tablet Take 75 mcg by mouth daily before breakfast.    . Multiple Vitamins-Minerals (PRESERVISION AREDS 2+MULTI VIT) CAPS Take 1 capsule by mouth 2 (two) times daily.    . tadalafil (ADCIRCA/CIALIS) 20 MG tablet Take 20 mg by mouth daily as needed for erectile dysfunction.     [name] zonisamide (ZONEGRAN) 100 MG capsule Take 300 mg by mouth daily.     No current facility-administered medications on file prior to visit.     ALLERGIES: Allergies  Allergen Reactions  . Chlorhexidine Rash    Full body  . Povidone-Iodine Rash    FAMILY HISTORY: Family History  Problem Relation Age of Onset  . Seizures Father   . Heart disease Father   . Cancer Father        prostat  . Cancer Mother        stomach  . Stomach cancer Mother   . Colon cancer Neg Hx   . Colon polyps Neg Hx   . Esophageal cancer Neg Hx   . Rectal cancer Neg Hx     SOCIAL HISTORY: Social History   Socioeconomic History  . Marital status: Married    Spouse name: [name]  . Number of children: 2  . Years of education: PhD  . 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 Financial resource strain: Not on file  . Food insecurity    Worry: Not on file    Inability: Not on file  . Transportation needs    Medical: Not on file    Non-medical: Not on file  Tobacco Use  . Smoking status: Former Smoker    Packs/day: 1.00    Years: 15.00    Pack years: 15.00    Types: Cigarettes    Quit date: 04/03/1972    Years since quitting: 46.6  . Smokeless tobacco: Never Used  Substance and Sexual Activity  . Alcohol use: Yes    Alcohol/week: 14.0 standard drinks    Types: 14 Glasses of wine per week    Comment: 1 or 2  wine daily; no consumption in last 24 hours  . Drug use: No  . Sexual activity: Not on file  Lifestyle  . Physical activity    Days per week: Not on file    Minutes per session: Not on file  . Stress: Not on file  Relationships  . Social Herbalist on phone: Not on file    Gets together: Not on file    Attends religious service: Not on file    Active member of club or organization: Not on file    Attends meetings of clubs or organizations: Not on file    Relationship status: Not on file  . Intimate partner violence    Fear of current or ex partner: Not on file    Emotionally abused: Not on file    Physically abused: Not on file    Forced sexual activity: Not on file  Other Topics Concern  . Not on file  Social History Narrative      Pt lives at home with his spouse.   Caffeine Use- 2 cups daily    REVIEW OF SYSTEMS: Constitutional: No fevers, chills, or sweats, no generalized fatigue, change in appetite Eyes: No visual changes, double vision,  eye pain Ear, nose and throat: No hearing loss, ear pain, nasal congestion, sore throat Cardiovascular: No chest pain, palpitations Respiratory:  No shortness of breath at rest or with exertion, wheezes GastrointestinaI: No nausea, vomiting, diarrhea, abdominal pain, fecal  incontinence Genitourinary:  No dysuria, urinary retention or frequency Musculoskeletal:  No neck pain, back pain Integumentary: No rash, pruritus, skin lesions Neurological: as above Psychiatric: No depression, insomnia, anxiety Endocrine: No palpitations, fatigue, diaphoresis, mood swings, change in appetite, change in weight, increased thirst Hematologic/Lymphatic:  No anemia, purpura, petechiae. Allergic/Immunologic: no itchy/runny eyes, nasal congestion, recent allergic reactions, rashes  PHYSICAL EXAM: Vitals:   11/24/18 1056  BP: 113/66  Pulse: 73  SpO2: 99%   General: No acute distress Head:  Normocephalic/atraumatic Skin/Extremities: No rash, no edema Neurological Exam: alert and oriented to person, place, and time. No aphasia or dysarthria. Fund of knowledge is appropriate.  Recent and remote memory are intact.  Attention and concentration are normal.    Able to name objects and repeat phrases. Cranial nerves: Pupils equal, round, reactive to light. Extraocular movements intact with no nystagmus. No facial asymmetry.Motor: moves all extremities symmetrically. Gait narrow-based and steady.  VNS Therapy Management: Parameters Output Current (mA): 1.5 Signal Frequency (Hz): 20 Pulse Width (usec): 250 Signal ON Time (sec): 30 Signal OFF Time (min): 5 Magnet Output Current (mA): 1.75 Magnet ON Time (sec): 60 Magnet Pulse Width (usec): 250 AutoStim Output Current (mA): 1.625 AutoStim Pulse Width (usec): 250 AutoStim ON Time (sec): 30 Tachycardia Detection : On Perform Verify Heartbeat Detection: yes Threshold for AutoStim (%): 20 Diagnostics Current Delievered (mA): 1.5 Lead Impedance: OK Impedence Value (Ohms): 3112 Battery Status Indicator (color): Green   IMPRESSION: This is a very pleasant 72 yo RH man with a history of focal epilepsy of right mesial temporal or right mesial frontal onset. Repeat MRI brain showed right hippocampal asymmetry suggestive of mesial  temporal sclerosis. PET scan showed right mesial temporal hypometabolism. EEGs in the past showed right temporal spikes and TIRDA, EMU admission captured seizures that appeared to localize to the right temporal area although clinically suggestive of frontal onset. VNS adjusted today with 3 parameters changed, output current increased to 1.95mA. He has been evaluated by Cardiology with holter showing nocturnal pauses, possibly related to seizure versus sleep apnea. Sleep study has been recommended. ILR was also discussed with him. He unfortunately did not have good response to bladder tumor treatments and will be going to [facility] for a second opinion. Continue with thyroid monitoring now that he is off Keytruda and on Synthroid. He is not driving. He will follow-up in 3 month and knows to call for any changes.   Thank you for allowing me to participate in his care.  Please do not hesitate to call for any questions or concerns.   Ellouise Newer, M.D.   CC: Dr. Dagmar Hait

## 2018-11-26 ENCOUNTER — Encounter: Payer: Self-pay | Admitting: Oncology

## 2018-11-27 ENCOUNTER — Other Ambulatory Visit: Payer: Self-pay | Admitting: Neurology

## 2018-11-30 ENCOUNTER — Other Ambulatory Visit (HOSPITAL_COMMUNITY)
Admission: RE | Admit: 2018-11-30 | Discharge: 2018-11-30 | Disposition: A | Discharge status: Home / Self Care | Payer: Medicare Other | Source: Ambulatory Visit | Attending: Urology | Admitting: Urology

## 2018-11-30 DIAGNOSIS — Z01812 Encounter for preprocedural laboratory examination: Secondary | ICD-10-CM | POA: Diagnosis present

## 2018-11-30 DIAGNOSIS — Z20828 Contact with and (suspected) exposure to other viral communicable diseases: Secondary | ICD-10-CM | POA: Diagnosis not present

## 2018-11-30 LAB — SARS CORONAVIRUS 2 (TAT 6-24 HRS): SARS Coronavirus 2: NEGATIVE

## 2018-12-02 ENCOUNTER — Encounter (HOSPITAL_BASED_OUTPATIENT_CLINIC_OR_DEPARTMENT_OTHER): Payer: Self-pay | Admitting: *Deleted

## 2018-12-02 ENCOUNTER — Other Ambulatory Visit: Payer: Self-pay

## 2018-12-02 NOTE — Anesthesia Preprocedure Evaluation (Addendum)
Anesthesia Evaluation  Patient identified by MRN, date of birth, ID band Patient awake    Reviewed: Allergy & Precautions, NPO status , Patient's Chart, lab work & pertinent test results  Airway Mallampati: II  TM Distance: >3 FB Neck ROM: Full    Dental  (+) Teeth Intact, Dental Advisory Given   Pulmonary former smoker,    Pulmonary exam normal breath sounds clear to auscultation       Cardiovascular negative cardio ROS Normal cardiovascular exam Rhythm:Regular Rate:Normal     Neuro/Psych Seizures -,  PSYCHIATRIC DISORDERS Anxiety Bipolar Disorder Temporal lobe epilepsy syndrome s/p vagus nerve stimulator 01/2018    GI/Hepatic negative GI ROS, Neg liver ROS,   Endo/Other  Hypothyroidism   Renal/GU negative Renal ROS   Bladder cancer     Musculoskeletal negative musculoskeletal ROS (+)   Abdominal   Peds  Hematology negative hematology ROS (+)   Anesthesia Other Findings Day of surgery medications reviewed with the patient.  Reproductive/Obstetrics                            Anesthesia Physical Anesthesia Plan  ASA: III  Anesthesia Plan: General   Post-op Pain Management:    Induction: Intravenous  PONV Risk Score and Plan: 3 and Dexamethasone, Ondansetron and Treatment may vary due to age or medical condition  Airway Management Planned: Oral ETT  Additional Equipment:   Intra-op Plan:   Post-operative Plan: Extubation in OR  Informed Consent: I have reviewed the patients History and Physical, chart, labs and discussed the procedure including the risks, benefits and alternatives for the proposed anesthesia with the patient or authorized representative who has indicated his/her understanding and acceptance.     Dental advisory given  Plan Discussed with: CRNA  Anesthesia Plan Comments:        Anesthesia Quick Evaluation

## 2018-12-02 NOTE — Progress Notes (Signed)
Spoke w/ pt via phone for pre-op interview.  Npo after mn w/ exception water and black coffee (no cream/ milk products) until 0630,  pt verbalized understanding.  Arrive at 1030.  Pt had covid test done 11-30-2018.  Current ekg in chart and epic.  Chart to be reviewed by anesthesia, Konrad Felix PA.

## 2018-12-02 NOTE — Progress Notes (Addendum)
Anesthesia Chart Review   Case: [id number] Date/Time: 12/03/18 1215   Procedure: TRANSURETHRAL RESECTION OF BLADDER TUMOR (TURBT) (N/A ) - 30 MINS   Anesthesia type: General   Pre-op diagnosis: BLADDER CANCER   Location: [facility] OR ROOM 2 / [place]   Surgeon: Cleon Gustin, MD      DISCUSSION:73 y.o. former smoker (15 pack years, quit 04/03/72) with h/o temporal lobe epilepsy syndrome, s/p vagus nerve stimulator 01/2018, bipolar 1 disorder, anxiety, bladder cancer scheduled for above procedure 12/03/2018 with Dr. Nicolette Bang.   Pt evaluated by cardiologist, Dr. Cristopher Peru, 10/26/2018 for nocturnal bradycardia.  Dr. Lovena Le recommended a sleep study.  Nocturnal pauses and bradycardia on cardiac event monitor asymptomatic.    Last seen by neurologist, Dr. Delice Lesch, 11/24/2018.  Last seizure 11/11/2018.  No changes made at this visit.  3 month follow up recommended.    Discussed with Dr. Tobias Alexander.  Anticipate pt can proceed with planned procedure barring acute status change.   VS: Ht 5\' 8"  (1.727 m)   Wt 61.2 kg   BMI 20.53 kg/m   PROVIDERS: Avva, Ravisankar, MD is PCP   Ellouise Newer, MD is Neurologist  LABS: SDW (all labs ordered are listed, but only abnormal results are displayed)  Labs Reviewed - No data to display   IMAGES:   EKG: 10/26/2018 Rate 55 bpm Sinus bradycardia Rightward axis   CV: Cardiac Event Monitor 11/15/2018 1. NSR with sinus tachycardia and sinus bradycardia 2. Nocturnal pauses and bradycardia are asymptomatic.  3. Episode of syncope does not correlate pauses. 4. No VT or SVT noted Past Medical History:  Diagnosis Date  . Anxiety disorder   . Bipolar 1 disorder, mixed ([place])    w/ hx psychosis/ dissociation reactions/ suicide ideattions and attempt  . Bladder cancer [facility]) urologist-- dr [facility]ly[facility] ([facility] and dr Lawerance Bach [facility] urology):  oncologist-  dr Alen Blew ([facility]) & dr Marcello Moores [facility])   dx 2018--- s/p  TURBT's ;  hx BCG  tx's.  chemotherapy completed 2019, and immunotherapy Keytruda , last one 10-21-2018  . BPH (benign prostatic hypertrophy) with urinary obstruction   . History of adenomatous polyp of colon   . History of closed head injury    2000-- fell off ladder--  temporary memory loss resolved  . History of psychosis    x2  last one documented 2006  w/ hallucination  and suicide ideation  . Nocturia   . Port-A-Cath in place 2018  . S/P placement of VNS (vagus nerve stimulation) device 01/28/2018   per pt swipe magnet twice daily  . Sigmoid diverticulosis   . Sinus bradycardia seen on cardiac monitor    cardiology --  dr g. taylor;   event monitor results 11-15-2018 in epic,  NSR w/ ST and SB and a nocturnal pause   . Temporal lobe epilepsy syndrome [facility]) neurologist-  dr Raliegh Ip. Delice Lesch-  avergae one every 2 weeks "legs jerking around, per wife , pt unaware he's doing this"   localization-related right temporal lobe --  mostly nocturnal w/ bilateral lower extremitity movement, staring and moaning then dissorietation afterwards (12-02-2018 per pt last daytime seizure 11-11-2018 and last nighttime seizure 11-29-2018  . Vesicoureteral reflux, bilateral   . Wears glasses     Past Surgical History:  Procedure Laterality Date  . COLONOSCOPY    . COLONOSCOPY W/ POLYPECTOMY  09-11-2009  . CYSTOSCOPY W/ RETROGRADES Bilateral 08/13/2018   Procedure: CYSTOSCOPY WITH RETROGRADE PYELOGRAM;  Surgeon: Cleon Gustin, MD;  Location: [facility];  Service: Urology;  Laterality: Bilateral;  . GREEN LIGHT LASER TURP (TRANSURETHRAL RESECTION OF PROSTATE N/A 04/07/2014   Procedure: GREEN LIGHT LASER TURP (TRANSURETHRAL RESECTION OF PROSTATE;  Surgeon: Ailene Rud, MD;  Location: [facility];  Service: Urology;  Laterality: N/A;  . INGUINAL HERNIA REPAIR Bilateral 04/15/2013   Procedure: LAPAROSCOPIC BILATERAL INGUINAL HERNIA REPAIR;  Surgeon: Gayland Curry, MD;  Location: [facility];  Service: General;   Laterality: Bilateral;  . INSERTION OF MESH N/A 04/15/2013   Procedure: INSERTION OF MESH;  Surgeon: Gayland Curry, MD;  Location: [facility];  Service: General;  Laterality: N/A;  . PORTACATH PLACEMENT  2018  . TRANSURETHRAL RESECTION OF BLADDER  01-27-2017;  05-26-2017;  05-25-2018  both by dr Chriss Czar davis @[facility]   . TRANSURETHRAL RESECTION OF BLADDER TUMOR N/A 08/13/2018   Procedure: TRANSURETHRAL RESECTION OF BLADDER TUMOR (TURBT);  Surgeon: Cleon Gustin, MD;  Location: [facility];  Service: Urology;  Laterality: N/A;  1 HR  . UMBILICAL HERNIA REPAIR N/A 04/15/2013   Procedure: OPEN HERNIA REPAIR UMBILICAL ADULT;  Surgeon: Gayland Curry, MD;  Location: [facility];  Service: General;  Laterality: N/A;  . VAGUS NERVE STIMULATOR INSERTION Left 01/28/2018   Procedure: VAGAL NERVE STIMULATOR PLACEMENT;  Surgeon: Consuella Lose, MD;  Location: [place];  Service: Neurosurgery;  Laterality: Left;  VAGAL NERVE STIMULATOR PLACEMENT    MEDICATIONS: No current facility-administered medications for this encounter.    [name] acetaminophen (TYLENOL) 325 MG tablet  . cloBAZam (ONFI) 10 MG tablet  . clonazePAM (KLONOPIN) 0.5 MG tablet  . levothyroxine (SYNTHROID) 100 MCG tablet  . Multiple Vitamins-Minerals (PRESERVISION AREDS 2+MULTI VIT) CAPS  . tadalafil (ADCIRCA/CIALIS) 20 MG tablet  . zonisamide (ZONEGRAN) 100 MG capsule    Maia Plan [facility] Pre-Surgical Testing [phone] 12/02/18  12:48 PM

## 2018-12-03 ENCOUNTER — Ambulatory Visit (HOSPITAL_BASED_OUTPATIENT_CLINIC_OR_DEPARTMENT_OTHER)
Admission: RE | Admit: 2018-12-03 | Discharge: 2018-12-03 | Disposition: A | Discharge status: Home / Self Care | Payer: Medicare Other | Attending: Urology | Admitting: Urology

## 2018-12-03 ENCOUNTER — Ambulatory Visit (HOSPITAL_BASED_OUTPATIENT_CLINIC_OR_DEPARTMENT_OTHER): Payer: Medicare Other | Admitting: Physician Assistant

## 2018-12-03 ENCOUNTER — Other Ambulatory Visit: Payer: Self-pay

## 2018-12-03 ENCOUNTER — Encounter (HOSPITAL_BASED_OUTPATIENT_CLINIC_OR_DEPARTMENT_OTHER)
Admission: RE | Disposition: A | Discharge status: Home / Self Care | Payer: Self-pay | Source: Home / Self Care | Attending: Urology

## 2018-12-03 ENCOUNTER — Encounter (HOSPITAL_BASED_OUTPATIENT_CLINIC_OR_DEPARTMENT_OTHER): Payer: Self-pay | Admitting: *Deleted

## 2018-12-03 DIAGNOSIS — Z79899 Other long term (current) drug therapy: Secondary | ICD-10-CM | POA: Diagnosis not present

## 2018-12-03 DIAGNOSIS — C679 Malignant neoplasm of bladder, unspecified: Secondary | ICD-10-CM | POA: Diagnosis present

## 2018-12-03 DIAGNOSIS — Z87891 Personal history of nicotine dependence: Secondary | ICD-10-CM | POA: Diagnosis not present

## 2018-12-03 DIAGNOSIS — Z8042 Family history of malignant neoplasm of prostate: Secondary | ICD-10-CM | POA: Diagnosis not present

## 2018-12-03 DIAGNOSIS — Z9221 Personal history of antineoplastic chemotherapy: Secondary | ICD-10-CM | POA: Diagnosis not present

## 2018-12-03 DIAGNOSIS — C7989 Secondary malignant neoplasm of other specified sites: Secondary | ICD-10-CM | POA: Insufficient documentation

## 2018-12-03 DIAGNOSIS — F419 Anxiety disorder, unspecified: Secondary | ICD-10-CM | POA: Insufficient documentation

## 2018-12-03 DIAGNOSIS — Z79890 Hormone replacement therapy (postmenopausal): Secondary | ICD-10-CM | POA: Insufficient documentation

## 2018-12-03 DIAGNOSIS — Z20828 Contact with and (suspected) exposure to other viral communicable diseases: Secondary | ICD-10-CM | POA: Diagnosis not present

## 2018-12-03 DIAGNOSIS — F3160 Bipolar disorder, current episode mixed, unspecified: Secondary | ICD-10-CM | POA: Diagnosis not present

## 2018-12-03 DIAGNOSIS — Z888 Allergy status to other drugs, medicaments and biological substances status: Secondary | ICD-10-CM | POA: Insufficient documentation

## 2018-12-03 DIAGNOSIS — Z800 Family history of malignant neoplasm of digestive organs: Secondary | ICD-10-CM | POA: Diagnosis not present

## 2018-12-03 DIAGNOSIS — Z8551 Personal history of malignant neoplasm of bladder: Secondary | ICD-10-CM | POA: Diagnosis not present

## 2018-12-03 HISTORY — PX: TRANSURETHRAL RESECTION OF BLADDER TUMOR: SHX2575

## 2018-12-03 HISTORY — DX: Bradycardia, unspecified: R00.1

## 2018-12-03 HISTORY — DX: Malignant neoplasm of bladder, unspecified: C67.9

## 2018-12-03 SURGERY — TURBT (TRANSURETHRAL RESECTION OF BLADDER TUMOR)
Anesthesia: General | Site: Bladder

## 2018-12-03 MED ORDER — ONDANSETRON HCL 4 MG/2ML IJ SOLN
INTRAMUSCULAR | Status: DC | PRN
  Administered 2018-12-03: 4 mg via INTRAVENOUS

## 2018-12-03 MED ORDER — ACETAMINOPHEN 500 MG PO TABS
ORAL_TABLET | ORAL | Status: AC
  Filled 2018-12-03: qty 2

## 2018-12-03 MED ORDER — ONDANSETRON HCL 4 MG/2ML IJ SOLN
INTRAMUSCULAR | Status: AC
  Filled 2018-12-03: qty 2

## 2018-12-03 MED ORDER — SODIUM CHLORIDE 0.9 % IR SOLN
Status: DC | PRN
  Administered 2018-12-03: 3000 mL

## 2018-12-03 MED ORDER — FENTANYL CITRATE (PF) 100 MCG/2ML IJ SOLN
INTRAMUSCULAR | Status: DC | PRN
  Administered 2018-12-03 (×8): 25 ug via INTRAVENOUS

## 2018-12-03 MED ORDER — TRAMADOL HCL 50 MG PO TABS: 50.0000 mg | ORAL_TABLET | Freq: Four times a day (QID) | ORAL | 0 refills | Status: DC | PRN

## 2018-12-03 MED ORDER — DEXAMETHASONE SODIUM PHOSPHATE 10 MG/ML IJ SOLN
INTRAMUSCULAR | Status: AC
  Filled 2018-12-03: qty 1

## 2018-12-03 MED ORDER — PROPOFOL 10 MG/ML IV BOLUS
INTRAVENOUS | Status: AC
  Filled 2018-12-03: qty 20

## 2018-12-03 MED ORDER — CEFAZOLIN SODIUM-DEXTROSE 2-4 GM/100ML-% IV SOLN
2.0000 g | INTRAVENOUS | Status: AC
  Administered 2018-12-03 (×2): 2 g via INTRAVENOUS
  Filled 2018-12-03: qty 100

## 2018-12-03 MED ORDER — ONDANSETRON HCL 4 MG/2ML IJ SOLN
4.0000 mg | Freq: Once | INTRAMUSCULAR | Status: DC | PRN
  Filled 2018-12-03: qty 2

## 2018-12-03 MED ORDER — ROCURONIUM BROMIDE 100 MG/10ML IV SOLN
INTRAVENOUS | Status: DC | PRN
  Administered 2018-12-03: 30 mg via INTRAVENOUS

## 2018-12-03 MED ORDER — EPHEDRINE 5 MG/ML INJ
INTRAVENOUS | Status: AC
  Filled 2018-12-03: qty 10

## 2018-12-03 MED ORDER — OXYCODONE HCL 5 MG PO TABS
5.0000 mg | ORAL_TABLET | Freq: Once | ORAL | Status: AC
  Administered 2018-12-03: 5 mg via ORAL
  Filled 2018-12-03: qty 1

## 2018-12-03 MED ORDER — LIDOCAINE 2% (20 MG/ML) 5 ML SYRINGE
INTRAMUSCULAR | Status: AC
  Filled 2018-12-03: qty 5

## 2018-12-03 MED ORDER — ACETAMINOPHEN 500 MG PO TABS
1000.0000 mg | ORAL_TABLET | Freq: Once | ORAL | Status: AC
  Administered 2018-12-03: 11:00:00 1000 mg via ORAL
  Filled 2018-12-03: qty 2

## 2018-12-03 MED ORDER — LIDOCAINE HCL (CARDIAC) PF 100 MG/5ML IV SOSY
PREFILLED_SYRINGE | INTRAVENOUS | Status: DC | PRN
  Administered 2018-12-03: 60 mg via INTRAVENOUS

## 2018-12-03 MED ORDER — ROCURONIUM BROMIDE 10 MG/ML (PF) SYRINGE
PREFILLED_SYRINGE | INTRAVENOUS | Status: AC
  Filled 2018-12-03: qty 10

## 2018-12-03 MED ORDER — PROPOFOL 10 MG/ML IV BOLUS
INTRAVENOUS | Status: DC | PRN
  Administered 2018-12-03: 140 mg via INTRAVENOUS

## 2018-12-03 MED ORDER — OXYCODONE HCL 5 MG PO TABS
ORAL_TABLET | ORAL | Status: AC
  Filled 2018-12-03: qty 1

## 2018-12-03 MED ORDER — SUGAMMADEX SODIUM 200 MG/2ML IV SOLN
INTRAVENOUS | Status: DC | PRN
  Administered 2018-12-03: 200 mg via INTRAVENOUS

## 2018-12-03 MED ORDER — LACTATED RINGERS IV SOLN
INTRAVENOUS | Status: DC
  Administered 2018-12-03: 11:00:00 via INTRAVENOUS
  Filled 2018-12-03: qty 1000

## 2018-12-03 MED ORDER — EPHEDRINE SULFATE 50 MG/ML IJ SOLN
INTRAMUSCULAR | Status: DC | PRN
  Administered 2018-12-03: 20 mg via INTRAVENOUS

## 2018-12-03 MED ORDER — FENTANYL CITRATE (PF) 100 MCG/2ML IJ SOLN
INTRAMUSCULAR | Status: AC
  Filled 2018-12-03: qty 2

## 2018-12-03 MED ORDER — SUCCINYLCHOLINE CHLORIDE 20 MG/ML IJ SOLN
INTRAMUSCULAR | Status: DC | PRN
  Administered 2018-12-03: 50 mg via INTRAVENOUS

## 2018-12-03 MED ORDER — SUGAMMADEX SODIUM 500 MG/5ML IV SOLN
INTRAVENOUS | Status: AC
  Filled 2018-12-03: qty 5

## 2018-12-03 MED ORDER — CEFAZOLIN SODIUM-DEXTROSE 2-4 GM/100ML-% IV SOLN
INTRAVENOUS | Status: AC
  Filled 2018-12-03: qty 100

## 2018-12-03 MED ORDER — FENTANYL CITRATE (PF) 100 MCG/2ML IJ SOLN
25.0000 ug | INTRAMUSCULAR | Status: DC | PRN
  Filled 2018-12-03: qty 1

## 2018-12-03 SURGICAL SUPPLY — 20 items
BAG DRAIN URO-CYSTO SKYTR STRL (DRAIN) ×2 IMPLANT
BAG URINE DRAINAGE (UROLOGICAL SUPPLIES) ×1 IMPLANT
BAG URINE LEG 19OZ MD ST LTX (BAG) IMPLANT
CATH FOLEY 3WAY 30CC 22F (CATHETERS) ×2 IMPLANT
CLOTH BEACON ORANGE TIMEOUT ST (SAFETY) ×2 IMPLANT
ELECT REM PT RETURN 9FT ADLT (ELECTROSURGICAL) ×2
ELECTRODE REM PT RTRN 9FT ADLT (ELECTROSURGICAL) ×1 IMPLANT
GLOVE BIO SURGEON STRL SZ8 (GLOVE) ×2 IMPLANT
GOWN STRL REUS W/TWL XL LVL3 (GOWN DISPOSABLE) ×2 IMPLANT
HOLDER FOLEY CATH W/STRAP (MISCELLANEOUS) ×1 IMPLANT
IV NS IRRIG 3000ML ARTHROMATIC (IV SOLUTION) ×2 IMPLANT
KIT TURNOVER CYSTO (KITS) ×2 IMPLANT
LOOP CUT BIPOLAR 24F LRG (ELECTROSURGICAL) ×1 IMPLANT
MANIFOLD NEPTUNE II (INSTRUMENTS) ×2 IMPLANT
PACK CYSTO (CUSTOM PROCEDURE TRAY) ×2 IMPLANT
PLUG CATH AND CAP STER (CATHETERS) IMPLANT
SYR 30ML LL (SYRINGE) ×2 IMPLANT
SYRINGE IRR TOOMEY STRL 70CC (SYRINGE) IMPLANT
TUBE CONNECTING 12X1/4 (SUCTIONS) ×2 IMPLANT
TUBING UROLOGY SET (TUBING) ×2 IMPLANT

## 2018-12-03 NOTE — H&P (Signed)
Urology Admission H&P  Chief Complaint: bladder cancer  History of Present Illness: Mr Herbers is a 73yo with a history of muscle invasive bladder cancer initially managed with chemotherapy. He has developed multiple recurrences and most recently had only high grade non invasive bladder cancer. He underwent BCG and developed another recurrence. He denies any worsening LUTS. No fevers/chills/sweats  Past Medical History:  Diagnosis Date  . Anxiety disorder   . Bipolar 1 disorder, mixed ([place])    w/ hx psychosis/ dissociation reactions/ suicide ideattions and attempt  . Bladder cancer [facility]) urologist-- dr Alyson Ingles ([facility]) and dr Lawerance Bach [facility]rology):  oncologist-  dr Alen Blew ([facility]) & dr Marcello Moores [facility])   dx 2018--- s/p  TURBT's ;  hx BCG tx's.  chemotherapy completed 2019, and immunotherapy Keytruda , last one 10-21-2018  . BPH (benign prostatic hypertrophy) with urinary obstruction   . History of adenomatous polyp of colon   . History of closed head injury    2000-- fell off ladder--  temporary memory loss resolved  . History of psychosis    x2  last one documented 2006  w/ hallucination  and suicide ideation  . Nocturia   . Port-A-Cath in place 2018  . S/P placement of VNS (vagus nerve stimulation) device 01/28/2018   per pt swipe magnet twice daily  . Sigmoid diverticulosis   . Sinus bradycardia seen on cardiac monitor    cardiology --  dr g. taylor;   event monitor results 11-15-2018 in epic,  NSR w/ ST and SB and a nocturnal pause   . Temporal lobe epilepsy syndrome [facility]) neurologist-  dr Raliegh Ip. Delice Lesch-  avergae one every 2 weeks "legs jerking around, per wife , pt unaware he's doing this"   localization-related right temporal lobe --  mostly nocturnal w/ bilateral lower extremitity movement, staring and moaning then dissorietation afterwards (12-02-2018 per pt last daytime seizure 11-11-2018 and last nighttime seizure 11-29-2018  . Vesicoureteral reflux, bilateral   . Wears  glasses    Past Surgical History:  Procedure Laterality Date  . COLONOSCOPY    . COLONOSCOPY W/ POLYPECTOMY  09-11-2009  . CYSTOSCOPY W/ RETROGRADES Bilateral 08/13/2018   Procedure: CYSTOSCOPY WITH RETROGRADE PYELOGRAM;  Surgeon: Cleon Gustin, MD;  [facility];  Service: Urology;  Laterality: Bilateral;  . GREEN LIGHT LASER TURP (TRANSURETHRAL RESECTION OF PROSTATE N/A 04/07/2014   Procedure: GREEN LIGHT LASER TURP (TRANSURETHRAL RESECTION OF PROSTATE;  Surgeon: Ailene Rud, MD;  Location: [facility];  Service: Urology;  Laterality: N/A;  . INGUINAL HERNIA REPAIR Bilateral 04/15/2013   Procedure: LAPAROSCOPIC BILATERAL INGUINAL HERNIA REPAIR;  Surgeon: Gayland Curry, MD;  [facility];  Service: General;  Laterality: Bilateral;  . INSERTION OF MESH N/A 04/15/2013   Procedure: INSERTION OF MESH;  Surgeon: Gayland Curry, MD;  [facility];  Service: General;  Laterality: N/A;  . PORTACATH PLACEMENT  2018  . TRANSURETHRAL RESECTION OF BLADDER  01-27-2017;  05-26-2017;  05-25-2018  both by dr Chriss Czar davis @[facility]   . TRANSURETHRAL RESECTION OF BLADDER TUMOR N/A 08/13/2018   Procedure: TRANSURETHRAL RESECTION OF BLADDER TUMOR (TURBT);  Surgeon: Cleon Gustin, MD;  [facility];  Service: Urology;  Laterality: N/A;  1 HR  . UMBILICAL HERNIA REPAIR N/A 04/15/2013   Procedure: OPEN HERNIA REPAIR UMBILICAL ADULT;  Surgeon: Gayland Curry, MD;  [facility];  Service: General;  Laterality: N/A;  . VAGUS NERVE STIMULATOR INSERTION Left 01/28/2018   Procedure: VAGAL  NERVE STIMULATOR PLACEMENT;  Surgeon: Consuella Lose, MD;  Location: [place];  Service: Neurosurgery;  Laterality: Left;  VAGAL NERVE STIMULATOR PLACEMENT    Home Medications:  Current Facility-Administered Medications  Medication Dose Route Frequency Provider Last Rate Last Dose  . ceFAZolin (ANCEF) IVPB 2g/100 mL premix  2 g Intravenous 30 min Pre-Op Alyson Ingles Candee Furbish, MD      .  lactated ringers infusion   Intravenous Continuous Lyn Hollingshead, MD 50 mL/hr at 12/03/18 1110     Allergies:  Allergies  Allergen Reactions  . Chlorhexidine Rash    Full body  . Povidone-Iodine Rash    Family History  Problem Relation Age of Onset  . Seizures Father   . Heart disease Father   . Cancer Father        prostat  . Cancer Mother        stomach  . Stomach cancer Mother   . Colon cancer Neg Hx   . Colon polyps Neg Hx   . Esophageal cancer Neg Hx   . Rectal cancer Neg Hx    Social History:  reports that he quit smoking about 46 years ago. His smoking use included cigarettes. He has a 15.00 pack-year smoking history. He has never used smokeless tobacco. He reports current alcohol use of about 14.0 standard drinks of alcohol per week. He reports that he does not use drugs.  Review of Systems  All other systems reviewed and are negative.   Physical Exam:  Vital signs in last 24 hours: Temp:  [97.8 F (36.6 C)] 97.8 F (36.6 C) (08/07 1050) Pulse Rate:  [68] 68 (08/07 1050) Resp:  [18] 18 (08/07 1050) BP: (100)/(76) 100/76 (08/07 1050) Weight:  [60.9 kg] 60.9 kg (08/07 1050) Physical Exam  Constitutional: He is oriented to person, place, and time. He appears well-developed and well-nourished.  HENT:  Head: Normocephalic and atraumatic.  Eyes: Pupils are equal, round, and reactive to light. EOM are normal.  Neck: Normal range of motion. No thyromegaly present.  Cardiovascular: Normal rate and regular rhythm.  Respiratory: Effort normal. No respiratory distress.  GI: Soft. He exhibits no distension.  Musculoskeletal: Normal range of motion.        General: No edema.  Neurological: He is alert and oriented to person, place, and time.  Skin: Skin is warm and dry.  Psychiatric: He has a normal mood and affect. His behavior is normal. Judgment and thought content normal.    Laboratory Data:  No results found for this or any previous visit (from the past 24  hour(s)). Recent Results (from the past 240 hour(s))  SARS CORONAVIRUS 2 Nasal Swab Aptima Multi Swab     Status: None   Collection Time: 11/30/18  3:20 PM   Specimen: Aptima Multi Swab; Nasal Swab  Result Value Ref Range Status   SARS Coronavirus 2 NEGATIVE NEGATIVE Final    Comment: (NOTE) SARS-CoV-2 target nucleic acids are NOT DETECTED. The SARS-CoV-2 RNA is generally detectable in upper and lower respiratory specimens during the acute phase of infection. Negative results do not preclude SARS-CoV-2 infection, do not rule out co-infections with other pathogens, and should not be used as the sole basis for treatment or other patient management decisions. Negative results must be combined with clinical observations, patient history, and epidemiological information. The expected result is Negative. Fact Sheet for Patients: SugarRoll.be Fact Sheet for Healthcare Providers: https://www.woods-mathews.com/ This test is not yet approved or cleared by the [place] FDA and  has  been authorized for detection and/or diagnosis of SARS-CoV-2 by FDA under an Emergency Use Authorization (EUA). This EUA will remain  in effect (meaning this test can be used) for the duration of the COVID-19 declaration under Section 56 4(b)(1) of the Act, 21 U.S.C. section 360bbb-3(b)(1), unless the authorization is terminated or revoked sooner. Performed at [facility], [place] [address] [place], [place] [address]    Creatinine: No results for input(s): CREATININE in the last 168 hours. Baseline Creatinine: unknown  Impression/Assessment:  73yo with recurrent bladder cancer  Plan:  The risks/benefits/alternatives to TURBT was explained to the patient and he understands and wishes to proceed with surgery  Nicolette Bang 12/03/2018, 12:35 PM

## 2018-12-03 NOTE — Op Note (Signed)
.  Preoperative diagnosis: bladder tumor  Postoperative diagnosis: Same  Procedure: 1 cystoscopy 2.  Transurethral resection of bladder tumor, medium  Attending: Rosie Fate  Anesthesia: General  Estimated blood loss: Minimal  Drains: 22 French foley  Specimens: posterior wall bladder tumor  Antibiotics: ancef  Findings: 3cm sessile posterior left wall tumor.  Ureteral orifices in normal anatomic location.   Indications: Patient is a 73 year old male with a history of muscle invasive bladder cancer who has developed a recurrence on a bladder sparing protocol.  After discussing treatment options, they decided proceed with transurethral resection of a bladder tumor.  Procedure her in detail: The patient was brought to the operating room and a brief timeout was done to ensure correct patient, correct procedure, correct site.  General anesthesia was administered patient was placed in dorsal lithotomy position.  Their genitalia was then prepped and draped in usual sterile fashion.  A rigid 40 French cystoscope was passed in the urethra and the bladder.  Bladder was inspected and we noted a large amount of clot and a 3cm bladder tumor.  the ureteral orifices were in the normal orthotopic locations.  Using the bipolar resectoscope we removed the bladder tumor down to the base. A subsequent muscle deep biopsy was then taken. Hemostasis was then obtained with electrocautery. We then removed the bladder tumor chips and sent them for pathology. We then re-inspected the bladder and found no residula bleeding.  the bladder was then drained, a 22 French foley was placed and this concluded the procedure which was well tolerated by patient.  Complications: None  Condition: Stable, extubated, transferred to PACU  Plan: Patient is to be discharged home and followup in 5 days for foley catheter removal and pathology discussion.

## 2018-12-03 NOTE — Anesthesia Procedure Notes (Signed)
Procedure Name: Intubation Date/Time: 12/03/2018 12:51 PM Performed by: Justice Rocher, CRNA Pre-anesthesia Checklist: Patient identified, Emergency Drugs available, Suction available and Patient being monitored Patient Re-evaluated:Patient Re-evaluated prior to induction Oxygen Delivery Method: Circle system utilized Preoxygenation: Pre-oxygenation with 100% oxygen Induction Type: IV induction Ventilation: Mask ventilation without difficulty Laryngoscope Size: Mac and 3 Grade View: Grade II Tube type: Oral Tube size: 7.5 mm Number of attempts: 1 Airway Equipment and Method: Stylet and Oral airway Placement Confirmation: ETT inserted through vocal cords under direct vision,  positive ETCO2 and breath sounds checked- equal and bilateral Tube secured with: Tape Dental Injury: Teeth and Oropharynx as per pre-operative assessment

## 2018-12-03 NOTE — Transfer of Care (Signed)
Immediate Anesthesia Transfer of Care Note  Patient: John Holt  Procedure(s) Performed: Procedure(s) (LRB): TRANSURETHRAL RESECTION OF BLADDER TUMOR (TURBT) (N/A)  Patient Location: PACU  Anesthesia Type: General  Level of Consciousness: awake, sedated, patient cooperative and responds to stimulation  Airway & Oxygen Therapy: Patient Spontanous Breathing and Patient connected to [place] O2 Post-op Assessment: Report given to PACU RN, Post -op Vital signs reviewed and stable and Patient moving all extremities  Post vital signs: Reviewed and stable  Complications: No apparent anesthesia complications

## 2018-12-03 NOTE — Anesthesia Postprocedure Evaluation (Signed)
Anesthesia Post Note  Patient: John Holt  Procedure(s) Performed: TRANSURETHRAL RESECTION OF BLADDER TUMOR (TURBT) (N/A Bladder)     Patient location during evaluation: PACU Anesthesia Type: General Level of consciousness: awake and alert Pain management: pain level controlled Vital Signs Assessment: post-procedure vital signs reviewed and stable Respiratory status: spontaneous breathing, nonlabored ventilation, respiratory function stable and patient connected to nasal cannula oxygen Cardiovascular status: blood pressure returned to baseline and stable Postop Assessment: no apparent nausea or vomiting Anesthetic complications: no    Last Vitals:  Vitals:   12/03/18 1345 12/03/18 1400  BP: 121/68 108/66  Pulse: 64 63  Resp: 10 13  Temp:    SpO2: 100% 92%    Last Pain:  Vitals:   12/03/18 1336  TempSrc:   PainSc: 0-No pain                 Catalina Gravel

## 2018-12-03 NOTE — Discharge Instructions (Signed)
Indwelling Urinary Catheter Care, Adult °An indwelling urinary catheter is a thin tube that is put into your bladder. The tube helps to drain pee (urine) out of your body. The tube goes in through your urethra. Your urethra is where pee comes out of your body. Your pee will come out through the catheter, then it will go into a bag (drainage bag). °Take good care of your catheter so it will work well. °How to wear your catheter and bag °Supplies needed °· Sticky tape (adhesive tape) or a leg strap. °· Alcohol wipe or soap and water (if you use tape). °· A clean towel (if you use tape). °· Large overnight bag. °· Smaller bag (leg bag). °Wearing your catheter °Attach your catheter to your leg with tape or a leg strap. °· Make sure the catheter is not pulled tight. °· If a leg strap gets wet, take it off and put on a dry strap. °· If you use tape to hold the bag on your leg: °1. Use an alcohol wipe or soap and water to wash your skin where the tape made it sticky before. °2. Use a clean towel to pat-dry that skin. °3. Use new tape to make the bag stay on your leg. °Wearing your bags °You should have been given a large overnight bag. °· You may wear the overnight bag in the day or night. °· Always have the overnight bag lower than your bladder.  Do not let the bag touch the floor. °· Before you go to sleep, put a clean plastic bag in a wastebasket. Then hang the overnight bag inside the wastebasket. °You should also have a smaller leg bag that fits under your clothes. °· Always wear the leg bag below your knee. °· Do not wear your leg bag at night. °How to care for your skin and catheter °Supplies needed °· A clean washcloth. °· Water and mild soap. °· A clean towel. °Caring for your skin and catheter ° °  ° °· Clean the skin around your catheter every day: °? Wash your hands with soap and water. °? Wet a clean washcloth in warm water and mild soap. °? Clean the skin around your urethra. °? If you are male: °? Gently  spread the folds of skin around your vagina (labia). °? With the washcloth in your other hand, wipe the inner side of your labia on each side. Wipe from front to back. °? If you are male: °? Pull back any skin that covers the end of your penis (foreskin). °? With the washcloth in your other hand, wipe your penis in small circles. Start wiping at the tip of your penis, then move away from the catheter. °? Move the foreskin back in place, if needed. °? With your free hand, hold the catheter close to where it goes into your body. °? Keep holding the catheter during cleaning so it does not get pulled out. °? With the washcloth in your other hand, clean the catheter. °? Only wipe downward on the catheter. °? Do not wipe upward toward your body. Doing this may push germs into your urethra and cause infection. °? Use a clean towel to pat-dry the catheter and the skin around it. Make sure to wipe off all soap. °? Wash your hands with soap and water. °· Shower every day. Do not take baths. °· Do not use cream, ointment, or lotion on the area where the catheter goes into your body, unless your doctor tells you   to. °· Do not use powders, sprays, or lotions on your genital area. °· Check your skin around the catheter every day for signs of infection. Check for: °? Redness, swelling, or pain. °? Fluid or blood. °? Warmth. °? Pus or a bad smell. °How to empty the bag °Supplies needed °· Rubbing alcohol. °· Gauze pad or cotton ball. °· Tape or a leg strap. °Emptying the bag °Pour the pee out of your bag when it is ?-½ full, or at least 2-3 times a day. Do this for your overnight bag and your leg bag. °1. Wash your hands with soap and water. °2. Separate (detach) the bag from your leg. °3. Hold the bag over the toilet or a clean pail. Keep the bag lower than your hips and bladder. This is so the pee (urine) does not go back into the tube. °4. Open the pour spout. It is at the bottom of the bag. °5. Empty the pee into the toilet or  pail. Do not let the pour spout touch any surface. °6. Put rubbing alcohol on a gauze pad or cotton ball. °7. Use the gauze pad or cotton ball to clean the pour spout. °8. Close the pour spout. °9. Attach the bag to your leg with tape or a leg strap. °10. Wash your hands with soap and water. °Follow instructions for cleaning the drainage bag: °· From the product maker. °· As told by your doctor. °How to change the bag °Supplies needed °· Alcohol wipes. °· A clean bag. °· Tape or a leg strap. °Changing the bag °Replace your bag when it starts to leak, smell bad, or look dirty. °1. Wash your hands with soap and water. °2. Separate the dirty bag from your leg. °3. Pinch the catheter with your fingers so that pee does not spill out. °4. Separate the catheter tube from the bag tube where these tubes connect (at the connection valve). Do not let the tubes touch any surface. °5. Clean the end of the catheter tube with an alcohol wipe. Use a different alcohol wipe to clean the end of the bag tube. °6. Connect the catheter tube to the tube of the clean bag. °7. Attach the clean bag to your leg with tape or a leg strap. Do not make the bag tight on your leg. °8. Wash your hands with soap and water. °General rules ° °· Never pull on your catheter. Never try to take it out. Doing that can hurt you. °· Always wash your hands before and after you touch your catheter or bag. Use a mild, fragrance-free soap. If you do not have soap and water, use hand sanitizer. °· Always make sure there are no twists or bends (kinks) in the catheter tube. °· Always make sure there are no leaks in the catheter or bag. °· Drink enough fluid to keep your pee pale yellow. °· Do not take baths, swim, or use a hot tub. °· If you are male, wipe from front to back after you poop (have a bowel movement). °Contact a doctor if: °· Your pee is cloudy. °· Your pee smells worse than usual. °· Your catheter gets clogged. °· Your catheter leaks. °· Your bladder  feels full. °Get help right away if: °· You have redness, swelling, or pain where the catheter goes into your body. °· You have fluid, blood, pus, or a bad smell coming from the area where the catheter goes into your body. °· Your skin feels warm where   the catheter goes into your body.  You have a fever.  You have pain in your: ? Belly (abdomen). ? Legs. ? Lower back. ? Bladder.  You see blood in the catheter.  Your pee is pink or red.  You feel sick to your stomach (nauseous).  You throw up (vomit).  You have chills.  Your pee is not draining into the bag.  Your catheter gets pulled out. Summary  An indwelling urinary catheter is a thin tube that is placed into the bladder to help drain pee (urine) out of the body.  The catheter is placed into the part of the body that drains pee from the bladder (urethra).  Taking good care of your catheter will keep it working properly and help prevent problems.  Always wash your hands before and after touching your catheter or bag.  Never pull on your catheter or try to take it out. This information is not intended to replace advice given to you by your health care provider. Make sure you discuss any questions you have with your health care provider. Document Released: 08/09/2012 Document Revised: 08/06/2018 Document Reviewed: 11/28/2016 Elsevier Patient Education  [place] Instructions  Activity: Get plenty of rest for the remainder of the day. A responsible individual must stay with you for 24 hours following the procedure.  For the next 24 hours, DO NOT: -Drive a car -Paediatric nurse -Drink alcoholic beverages -Take any medication unless instructed by your physician -Make any legal decisions or sign important papers.  Meals: Start with liquid foods such as gelatin or soup. Progress to regular foods as tolerated. Avoid greasy, spicy, heavy foods. If nausea and/or vomiting occur, drink  only clear liquids until the nausea and/or vomiting subsides. Call your physician if vomiting continues.  Special Instructions/Symptoms: Your throat may feel dry or sore from the anesthesia or the breathing tube placed in your throat during surgery. If this causes discomfort, gargle with warm salt water. The discomfort should disappear within 24 hours.

## 2018-12-04 ENCOUNTER — Emergency Department (HOSPITAL_COMMUNITY)
Admission: EM | Admit: 2018-12-04 | Discharge: 2018-12-04 | Disposition: A | Discharge status: Home / Self Care | Payer: Medicare Other | Attending: Emergency Medicine | Admitting: Emergency Medicine

## 2018-12-04 ENCOUNTER — Emergency Department (HOSPITAL_COMMUNITY): Payer: Medicare Other

## 2018-12-04 ENCOUNTER — Encounter (HOSPITAL_COMMUNITY): Payer: Self-pay | Admitting: Emergency Medicine

## 2018-12-04 ENCOUNTER — Other Ambulatory Visit: Payer: Self-pay

## 2018-12-04 DIAGNOSIS — G40219 Localization-related (focal) (partial) symptomatic epilepsy and epileptic syndromes with complex partial seizures, intractable, without status epilepticus: Secondary | ICD-10-CM | POA: Insufficient documentation

## 2018-12-04 DIAGNOSIS — S0990XA Unspecified injury of head, initial encounter: Secondary | ICD-10-CM

## 2018-12-04 DIAGNOSIS — S098XXA Other specified injuries of head, initial encounter: Secondary | ICD-10-CM | POA: Insufficient documentation

## 2018-12-04 DIAGNOSIS — Y939 Activity, unspecified: Secondary | ICD-10-CM | POA: Insufficient documentation

## 2018-12-04 DIAGNOSIS — W19XXXA Unspecified fall, initial encounter: Secondary | ICD-10-CM | POA: Diagnosis not present

## 2018-12-04 DIAGNOSIS — Y999 Unspecified external cause status: Secondary | ICD-10-CM | POA: Insufficient documentation

## 2018-12-04 DIAGNOSIS — E039 Hypothyroidism, unspecified: Secondary | ICD-10-CM

## 2018-12-04 DIAGNOSIS — Z79899 Other long term (current) drug therapy: Secondary | ICD-10-CM | POA: Diagnosis not present

## 2018-12-04 DIAGNOSIS — Y92099 Unspecified place in other non-institutional residence as the place of occurrence of the external cause: Secondary | ICD-10-CM | POA: Diagnosis not present

## 2018-12-04 DIAGNOSIS — Z87891 Personal history of nicotine dependence: Secondary | ICD-10-CM | POA: Insufficient documentation

## 2018-12-04 LAB — URINALYSIS, ROUTINE W REFLEX MICROSCOPIC

## 2018-12-04 LAB — URINALYSIS, MICROSCOPIC (REFLEX)
Bacteria, UA: NONE SEEN
RBC / HPF: >50 RBC/hpf (ref 0–5)

## 2018-12-04 LAB — RAPID URINE DRUG SCREEN, HOSP PERFORMED
Amphetamines: NOT DETECTED
Barbiturates: NOT DETECTED
Benzodiazepines: POSITIVE — AB
Cocaine: NOT DETECTED
Opiates: NOT DETECTED
Tetrahydrocannabinol: NOT DETECTED

## 2018-12-04 LAB — CBC WITH DIFFERENTIAL/PLATELET
Abs Immature Granulocytes: 0.03 10*3/uL (ref 0.00–0.07)
Basophils Absolute: 0 10*3/uL (ref 0.0–0.1)
Basophils Relative: 1 %
Eosinophils Absolute: 0.3 10*3/uL (ref 0.0–0.5)
Eosinophils Relative: 5 %
HCT: 46.1 % (ref 39.0–52.0)
Hemoglobin: 14.7 g/dL (ref 13.0–17.0)
Immature Granulocytes: 0 %
Lymphocytes Relative: 6 %
Lymphs Abs: 0.4 10*3/uL — ABNORMAL LOW (ref 0.7–4.0)
MCH: 30.2 pg (ref 26.0–34.0)
MCHC: 31.9 g/dL (ref 30.0–36.0)
MCV: 94.7 fL (ref 80.0–100.0)
Monocytes Absolute: 0.7 10*3/uL (ref 0.1–1.0)
Monocytes Relative: 10 %
Neutro Abs: 5.3 10*3/uL (ref 1.7–7.7)
Neutrophils Relative %: 78 %
Platelets: 214 10*3/uL (ref 150–400)
RBC: 4.87 MIL/uL (ref 4.22–5.81)
RDW: 16.6 % — ABNORMAL HIGH (ref 11.5–15.5)
WBC: 6.8 10*3/uL (ref 4.0–10.5)
nRBC: 0 % (ref 0.0–0.2)

## 2018-12-04 LAB — COMPREHENSIVE METABOLIC PANEL
ALT: 10 U/L (ref 0–44)
AST: 22 U/L (ref 15–41)
Albumin: 4 g/dL (ref 3.5–5.0)
Alkaline Phosphatase: 73 U/L (ref 38–126)
Anion gap: 10 (ref 5–15)
BUN: 16 mg/dL (ref 8–23)
CO2: 25 mmol/L (ref 22–32)
Calcium: 9.3 mg/dL (ref 8.9–10.3)
Chloride: 104 mmol/L (ref 98–111)
Creatinine, Ser: 1.08 mg/dL (ref 0.61–1.24)
GFR calc Af Amer: >60 mL/min (ref >60)
GFR calc non Af Amer: >60 mL/min (ref >60)
Glucose, Bld: 93 mg/dL (ref 70–99)
Potassium: 4.1 mmol/L (ref 3.5–5.1)
Sodium: 139 mmol/L (ref 135–145)
Total Bilirubin: 0.5 mg/dL (ref 0.3–1.2)
Total Protein: 6.7 g/dL (ref 6.5–8.1)

## 2018-12-04 LAB — TSH: TSH: 26.564 u[IU]/mL — ABNORMAL HIGH (ref 0.350–4.500)

## 2018-12-04 MED ORDER — SODIUM CHLORIDE 0.9 % IV BOLUS
1000.0000 mL | Freq: Once | INTRAVENOUS | Status: AC
  Administered 2018-12-04: 1000 mL via INTRAVENOUS

## 2018-12-04 NOTE — ED Triage Notes (Signed)
Patient is from home where he fell from a chair onto the floor. Wife reported she found the patient unconscious. When EMS arrived to the scene he was ambulatory and A&O x 3. EMS reports abrasions on his nose and forehead. Yesterday he was discharged from the hospital after having a tumor removed from his bladder. He has a history of seizures.   EMS vitals: 142/76 BP 75 HR 96% O2 sat on room air 79 CBG

## 2018-12-04 NOTE — ED Provider Notes (Signed)
[facility] DEPT Provider Note   CSN: [id number] Arrival date & time: 12/04/18  1833     History   Chief Complaint Chief Complaint  Patient presents with   Fall    HPI [name] is a 73 y.o. male history of bipolar, bladder cancer on Keytruda status post TURP yesterday here presenting with fall.  Patient was at home and was in the room and wife heard him fall.  She ran over right away and saw that he was sliding face down.  He may had some tremors but no active seizure-like activity.  He also went home with a Foley and states that it has been draining bloody but he is not on antibiotics.  He states that the Foley has still been draining without any clots .  Denies any fevers or chills.  Patient had seen neurologist and is currently on zonisamide and Onfi for seizures. He is compliant with those medicines.  Of note, patient was told that Beryle Flock can cause hypothyroidism and he is placed on Synthroid currently.     The history is provided by the patient and the spouse.    Past Medical History:  Diagnosis Date   Anxiety disorder    Bipolar 1 disorder, mixed ([place])    w/ hx psychosis/ dissociation reactions/ suicide ideattions and attempt   Bladder cancer [facility]) urologist-- dr Alyson Ingles ([facility]) and dr Lawerance [facility] urology):  oncologist-  dr Alen Blew ([facility]) & dr Marcello [facility])   dx 2018--- s/p  TURBT's ;  hx BCG tx's.  chemotherapy completed 2019, and immunotherapy Keytruda , last one 10-21-2018   BPH (benign prostatic hypertrophy) with urinary obstruction    History of adenomatous polyp of colon    History of closed head injury    2000-- fell off ladder--  temporary memory loss resolved   History of psychosis    x2  last one documented 2006  w/ hallucination  and suicide ideation   Nocturia    Port-A-Cath in place 2018   S/P placement of VNS (vagus nerve stimulation) device 01/28/2018   per pt swipe magnet twice daily    Sigmoid diverticulosis    Sinus bradycardia seen on cardiac monitor    cardiology --  dr g. taylor;   event monitor results 11-15-2018 in epic,  NSR w/ ST and SB and a nocturnal pause    Temporal lobe epilepsy syndrome [facility]) neurologist-  dr Ala Bent-  avergae one every 2 weeks "legs jerking around, per wife , pt unaware he's doing this"   localization-related right temporal lobe --  mostly nocturnal w/ bilateral lower extremitity movement, staring and moaning then dissorietation afterwards (12-02-2018 per pt last daytime seizure 11-11-2018 and last nighttime seizure 11-29-2018   Vesicoureteral reflux, bilateral    Wears glasses     Patient Active Problem List   Diagnosis Date Noted   Sinus bradycardia 10/26/2018   Malignant neoplasm of posterior wall of urinary bladder ([place]) 02/24/2017   Benign prostatic hyperplasia with urinary obstruction 08/18/2016   ED (erectile dysfunction) of organic origin 08/18/2016   Focal epilepsy with impairment of consciousness, intractable ([place]) 04/30/2014   Memory loss 04/30/2014   Localization-related symptomatic epilepsy and epileptic syndromes with complex partial seizures, intractable, without status epilepticus ([place]) 08/11/2012    Past Surgical History:  Procedure Laterality Date   COLONOSCOPY     COLONOSCOPY W/ POLYPECTOMY  09-11-2009   CYSTOSCOPY W/ RETROGRADES Bilateral 08/13/2018   Procedure: CYSTOSCOPY WITH RETROGRADE PYELOGRAM;  Surgeon: Cleon Gustin, MD;  Location: [facility];  Service: Urology;  Laterality: Bilateral;   GREEN LIGHT LASER TURP (TRANSURETHRAL RESECTION OF PROSTATE N/A 04/07/2014   Procedure: GREEN LIGHT LASER TURP (TRANSURETHRAL RESECTION OF PROSTATE;  Surgeon: Ailene Rud, MD;  Location[facility];  Service: Urology;  Laterality: N/A;   INGUINAL HERNIA REPAIR Bilateral 04/15/2013   Procedure: LAPAROSCOPIC BILATERAL INGUINAL HERNIA REPAIR;  Surgeon: Gayland Curry, MD;  Location: [facility];  Service: General;  Laterality: Bilateral;   INSERTION OF MESH N/A 04/15/2013   Procedure: INSERTION OF MESH;  Surgeon: Gayland Curry, MD;  Location: [facility];  Service: General;  Laterality: N/A;   PORTACATH PLACEMENT  2018   TRANSURETHRAL RESECTION OF BLADDER  01-27-2017;  05-26-2017;  05-25-2018  both by dr Chriss Czar davis [email or web address]    TRANSURETHRAL RESECTION OF BLADDER TUMOR N/A 08/13/2018   Procedure: TRANSURETHRAL RESECTION OF BLADDER TUMOR (TURBT);  Surgeon: Cleon Gustin, MD;  Location: [facility];  Service: Urology;  Laterality: N/A;  1 HR   UMBILICAL HERNIA REPAIR N/A 04/15/2013   Procedure: OPEN HERNIA REPAIR UMBILICAL ADULT;  Surgeon: Gayland Curry, MD;  Location: [facility];  Service: General;  Laterality: N/A;   VAGUS NERVE STIMULATOR INSERTION Left 01/28/2018   Procedure: VAGAL NERVE STIMULATOR PLACEMENT;  Surgeon: Consuella Lose, MD;  Location: [facility];  Service: Neurosurgery;  Laterality: Left;  VAGAL NERVE STIMULATOR PLACEMENT        Home Medications    Prior to Admission medications   Medication Sig Start Date End Date Taking? Authorizing Provider  acetaminophen (TYLENOL) 325 MG tablet Take 650 mg by mouth every 6 (six) hours as needed for moderate pain.  05/26/18   [provider]  cloBAZam (ONFI) 10 MG tablet Take 1/2 tablet every night Patient taking differently: Take 5 mg by mouth at bedtime. Take 1/2 tablet every night 11/24/18   Cameron Sprang, MD  clonazePAM (KLONOPIN) 0.5 MG tablet Take 1 tablet (0.5 mg total) by mouth daily as needed (epilepsy). 07/16/18   Cameron Sprang, MD  levothyroxine (SYNTHROID) 100 MCG tablet Take 100 mcg by mouth daily before breakfast.    [provider]  Multiple Vitamins-Minerals (PRESERVISION AREDS 2+MULTI VIT) CAPS Take 1 capsule by mouth 2 (two) times daily.    [provider]  tadalafil (ADCIRCA/CIALIS) 20 MG tablet Take 20 mg by mouth daily as needed for erectile dysfunction.     [provider]    traMADol (ULTRAM) 50 MG tablet Take 1 tablet (50 mg total) by mouth every 6 (six) hours as needed for moderate pain. 12/03/18 12/03/19  Cleon Gustin, MD  zonisamide (ZONEGRAN) 100 MG capsule TAKE 3 CAPSULES EACH NIGHT Patient taking differently: Take 300 mg by mouth at bedtime. TAKE 3 CAPSULES EACH NIGHT 11/29/18   Cameron Sprang, MD    Family History Family History  Problem Relation Age of Onset   Seizures Father    Heart disease Father    Cancer Father        prostat   Cancer Mother        stomach   Stomach cancer Mother    Colon cancer Neg Hx    Colon polyps Neg Hx    Esophageal cancer Neg Hx    Rectal cancer Neg Hx     Social History Social History   Tobacco Use   Smoking status: Former Smoker    Packs/day: 1.00    Years: 15.00  Pack years: 15.00    Types: Cigarettes    Quit date: 04/03/1972    Years since quitting: 46.7   Smokeless tobacco: Never Used  Substance Use Topics   Alcohol use: Yes    Alcohol/week: 14.0 standard drinks    Types: 14 Glasses of wine per week    Comment: 1 or 2  wine daily; no consumption in last 24 hours   Drug use: No     Allergies   Chlorhexidine and Povidone-iodine   Review of Systems Review of Systems  Neurological: Positive for weakness.  All other systems reviewed and are negative.    Physical Exam Updated Vital Signs BP 140/75 (BP Location: Right Arm)    Pulse 67    Temp 97.9 F (36.6 C) (Oral)    Resp 15    SpO2 99%   Physical Exam Vitals signs and nursing note reviewed.  Constitutional:      Comments: Abrasion frontal scalp   HENT:     Head: Normocephalic.     Right Ear: Tympanic membrane normal.     Left Ear: Tympanic membrane normal.     Nose:     Comments: Abrasion on the nose and frontal scalp     Mouth/Throat:     Mouth: Mucous membranes are dry.  Eyes:     Extraocular Movements: Extraocular movements intact.     Pupils: Pupils are equal, round, and reactive to light.  Neck:      Musculoskeletal: Normal range of motion.  Cardiovascular:     Rate and Rhythm: Normal rate and regular rhythm.     Pulses: Normal pulses.     Heart sounds: Normal heart sounds.  Pulmonary:     Effort: Pulmonary effort is normal.  Abdominal:     General: Abdomen is flat.     Palpations: Abdomen is soft.  Genitourinary:    Comments: Foley in place with dark urine with no clots  Musculoskeletal: Normal range of motion.  Skin:    General: Skin is warm.     Capillary Refill: Capillary refill takes less than 2 seconds.  Neurological:     General: No focal deficit present.     Mental Status: He is alert.  Psychiatric:        Mood and Affect: Mood normal.        Behavior: Behavior normal.      ED Treatments / Results  Labs (all labs ordered are listed, but only abnormal results are displayed) Labs Reviewed  CBC WITH DIFFERENTIAL/PLATELET - Abnormal; Notable for the following components:      Result Value   RDW 16.6 (*)    Lymphs Abs 0.4 (*)    All other components within normal limits  URINALYSIS, ROUTINE W REFLEX MICROSCOPIC - Abnormal; Notable for the following components:   Color, Urine RED (*)    APPearance TURBID (*)    Glucose, UA   (*)    Value: TEST NOT REPORTED DUE TO COLOR INTERFERENCE OF URINE PIGMENT   Hgb urine dipstick   (*)    Value: TEST NOT REPORTED DUE TO COLOR INTERFERENCE OF URINE PIGMENT   Bilirubin Urine   (*)    Value: TEST NOT REPORTED DUE TO COLOR INTERFERENCE OF URINE PIGMENT   Ketones, ur   (*)    Value: TEST NOT REPORTED DUE TO COLOR INTERFERENCE OF URINE PIGMENT   Protein, ur   (*)    Value: TEST NOT REPORTED DUE TO COLOR INTERFERENCE OF URINE PIGMENT  Nitrite   (*)    Value: TEST NOT REPORTED DUE TO COLOR INTERFERENCE OF URINE PIGMENT   Leukocytes,Ua   (*)    Value: TEST NOT REPORTED DUE TO COLOR INTERFERENCE OF URINE PIGMENT   All other components within normal limits  RAPID URINE DRUG SCREEN, HOSP PERFORMED - Abnormal; Notable for the  following components:   Benzodiazepines POSITIVE (*)    All other components within normal limits  URINE CULTURE  URINALYSIS, MICROSCOPIC (REFLEX)  COMPREHENSIVE METABOLIC PANEL  TSH    EKG EKG Interpretation  Date/Time:  Saturday December 04 2018 19:24:46 EDT Ventricular Rate:  73 PR Interval:    QRS Duration: 106 QT Interval:  384 QTC Calculation: 424 R Axis:   78 Text Interpretation:  Sinus rhythm No significant change since last tracing Confirmed by Wandra Arthurs [phone]) on 12/04/2018 7:40:07 PM   Radiology No results found.  Procedures Procedures (including critical care time)  Medications Ordered in ED Medications  sodium chloride 0.9 % bolus 1,000 mL (1,000 mLs Intravenous New Bag/Given 12/04/18 1947)     Initial Impression / Assessment and Plan / ED Course  I have reviewed the triage vital signs and the nursing notes.  Pertinent labs & imaging results that were available during my care of the patient were reviewed by me and considered in my medical decision making (see chart for details).       MARCEL [name] is a 73 y.o. male here with fall with head injury.  Unclear if he had a seizure or not but he has history of seizure and is taking zonisamide and Onfi as prescribed.  Also had TURP yesterday and has some hematuria.  I talked to the on-call urologist, Dr. Anthony Sar, who states that if the foley is draining and there are no clots, patient is ok from a urologic stand point. Will get CT head/neck, labs, TSH, UA.   9:08 PM UA showed blood but no obvious UTI. WBC nl. TSH is 25 but was 29 last week. CT head/neck/face unremarkable. Stable for discharge.    Final Clinical Impressions(s) / ED Diagnoses   Final diagnoses:  None    ED Discharge Orders    None       Drenda Freeze, MD 12/04/18 2109

## 2018-12-04 NOTE — Discharge Instructions (Signed)
Stay hydrated.   Observe your foley and make sure it still drains well. Follow up with urology   See your doctor. Your TSH is 25 today   Return to ER if you have another fall, vomiting, passing out, seizure

## 2018-12-06 ENCOUNTER — Encounter (HOSPITAL_BASED_OUTPATIENT_CLINIC_OR_DEPARTMENT_OTHER): Payer: Self-pay | Admitting: Urology

## 2018-12-06 LAB — URINE CULTURE: Culture: NO GROWTH

## 2018-12-09 ENCOUNTER — Telehealth: Payer: Self-pay | Admitting: Oncology

## 2018-12-09 NOTE — Telephone Encounter (Signed)
Called patient regarding upcoming [facility] appointment, spoke with patient's relative and test run is complete. E-mail has been sent.

## 2018-12-13 ENCOUNTER — Inpatient Hospital Stay: Payer: Medicare Other | Attending: Oncology | Admitting: Oncology

## 2018-12-13 ENCOUNTER — Encounter: Payer: Self-pay | Admitting: Oncology

## 2018-12-13 DIAGNOSIS — C674 Malignant neoplasm of posterior wall of bladder: Secondary | ICD-10-CM

## 2018-12-13 DIAGNOSIS — Z8551 Personal history of malignant neoplasm of bladder: Secondary | ICD-10-CM | POA: Insufficient documentation

## 2018-12-13 NOTE — Progress Notes (Signed)
Hematology and Oncology Follow Up for Telemedicine Visits  John Holt [id number] [birth year] 73 y.o. 12/13/2018 10:32 AM Avva, Steva Ready, Marcellina Millin, MD   I connected with John Holt on 12/13/18 at 11:00 AM EDT by video enabled telemedicine visit and verified that I am speaking with the correct person using two identifiers.   I discussed the limitations, risks, security and privacy concerns of performing an evaluation and management service by telemedicine and the availability of in-person appointments. I also discussed with the patient that there may be a patient responsible charge related to this service. The patient expressed understanding and agreed to proceed.  Other persons participating in the visit and their role in the encounter: His wife John Holt  Patient's location: Home Provider's location: Office    Principle Diagnosis: 73 year old man with bladder cancer diagnosed in 2018.  He has muscle invasive disease without any evidence of metastasis.   Prior Therapy:  He is S/P  neoadjuvant chemotherapy in preparation for radical cystectomy utilizing MVAC for 4 cycles under the care of Dr. Marcello Moores at [facility].  He subsequently declined cystectomy for personal reasons.    He remained disease free till January 2020 where he has recurrence of non-muscle invasive disease.  He was started on Pembrolizumab in March 2020 after declining cystectomy.    Repeat TURBT in April 2020 showed persistent high-grade noninvasive papillary urothelial carcinoma.  He underwent BCG treatment in May 2020 under the care of Dr. Alyson Ingles.  Repeat TURBT in August 2020 showed muscle invasive disease without any evidence of metastatic disease based on imaging studies in June 2020.   Current therapy: Under evaluation for subsequent therapies.  Interim History: Since the last visit, he underwent a TURBT under the care of Dr. Alyson Ingles which has tolerated well.  He did have a  urinary tract infection urinary retention and required Foley catheter for about a week.  He is feeling better at this time and his catheter has been removed.  He denies any frequency urgency at this time.  He denies any hematuria.  He has a performance status is unchanged.     Medications: I have reviewed the patient's current medications.  Current Outpatient Medications  Medication Sig Dispense Refill  . acetaminophen (TYLENOL) 325 MG tablet Take 650 mg by mouth every 6 (six) hours as needed for moderate pain.     . cloBAZam (ONFI) 10 MG tablet Take 1/2 tablet every night (Patient taking differently: Take 5 mg by mouth at bedtime. ) 135 tablet 3  . clonazePAM (KLONOPIN) 0.5 MG tablet Take 1 tablet (0.5 mg total) by mouth daily as needed (epilepsy). 30 tablet 3  . levothyroxine (SYNTHROID) 100 MCG tablet Take 100 mcg by mouth daily before breakfast.    . Multiple Vitamins-Minerals (PRESERVISION AREDS 2+MULTI VIT) CAPS Take 1 capsule by mouth 2 (two) times daily.    . tadalafil (ADCIRCA/CIALIS) 20 MG tablet Take 20 mg by mouth daily as needed for erectile dysfunction.     . traMADol (ULTRAM) 50 MG tablet Take 1 tablet (50 mg total) by mouth every 6 (six) hours as needed for moderate pain. 15 tablet 0  . zonisamide (ZONEGRAN) 100 MG capsule TAKE 3 CAPSULES EACH NIGHT (Patient taking differently: Take 300 mg by mouth at bedtime. ) 270 capsule 3   No current facility-administered medications for this visit.      Allergies:  Allergies  Allergen Reactions  . Chlorhexidine Rash    Full body  . Povidone-Iodine Rash  Past Medical History, Surgical history, Social history, and Family History were reviewed and updated.    Lab Results: Lab Results  Component Value Date   WBC 6.8 12/04/2018   HGB 14.7 12/04/2018   HCT 46.1 12/04/2018   MCV 94.7 12/04/2018   PLT 214 12/04/2018     Chemistry      Component Value Date/Time   NA 139 12/04/2018 1948   K 4.1 12/04/2018 1948   CL 104  12/04/2018 1948   CO2 25 12/04/2018 1948   BUN 16 12/04/2018 1948   CREATININE 1.08 12/04/2018 1948      Component Value Date/Time   CALCIUM 9.3 12/04/2018 1948   ALKPHOS 73 12/04/2018 1948   AST 22 12/04/2018 1948   ALT 10 12/04/2018 1948   BILITOT 0.5 12/04/2018 1948       Impression and Plan:  73 year old man with:  1.  Recurrent bladder cancer diagnosed in 2018.  He was found to have muscle invasive disease and status post therapy outlined above.  He is currently receiving intermittent cystoscopy and his last cystoscopy obtained on 12/03/2018 under the care of Dr. Alyson Ingles showed recurrent bladder tumor that is muscle invasive.  The natural course of this disease was discussed today and treatment options were reviewed.  These options include a radical cystectomy which she has declined in the past, radiation therapy concomitantly with chemotherapy versus continued supportive management with repeat cystoscopies only and TURBT as needed.  See no role for any additional systemic therapy at this time as he has received both systemic chemotherapy as well as immune based therapy.  After discussion today he is more open to the idea of surgery.  The role of neoadjuvant chemotherapy was discussed but given his previous exposure to systemic chemotherapy I am in favor proceeding with radical cystectomy upfront rather than retreatment with cisplatin based therapy.  He is interested in seeking another opinion at the [facility] clinic and he is consider surgical options at this time.  He understands he develops recurrent disease outside of his bladder and any treatment would be palliative.  All his questions and his wife's questions were answered to their satisfaction.  2.  Follow-up of happy to see him in the future as needed.  I discussed the assessment and treatment plan with the patient. The patient was provided an opportunity to ask questions and all were answered. The patient agreed with the  plan and demonstrated an understanding of the instructions.   The patient was advised to call back or seek an in-person evaluation if the symptoms worsen or if the condition fails to improve as anticipated.  I provided 30 minutes of face-to-face video visit time during this encounter, and > 50% was dedicated to reviewing his disease status, treatment options, complication lytic therapy as well as future plan of care.  Zola Button, MD 12/13/2018 10:32 AM

## 2018-12-26 ENCOUNTER — Other Ambulatory Visit: Payer: Self-pay

## 2018-12-26 ENCOUNTER — Emergency Department (HOSPITAL_COMMUNITY)
Admission: EM | Admit: 2018-12-26 | Discharge: 2018-12-26 | Discharge status: Against Medical Advice | Payer: Medicare Other | Attending: Emergency Medicine | Admitting: Emergency Medicine

## 2018-12-26 ENCOUNTER — Encounter (HOSPITAL_COMMUNITY): Payer: Self-pay

## 2018-12-26 DIAGNOSIS — R55 Syncope and collapse: Secondary | ICD-10-CM | POA: Diagnosis present

## 2018-12-26 DIAGNOSIS — Z5321 Procedure and treatment not carried out due to patient leaving prior to being seen by health care provider: Secondary | ICD-10-CM | POA: Diagnosis not present

## 2018-12-26 NOTE — ED Notes (Signed)
Pt leaving d/t wait time.

## 2018-12-26 NOTE — ED Triage Notes (Signed)
Pt passed out earlier today and was out of 5 minutes per wife, who witnessed episode. Pt neurologist recommended that he be evaluated. Pt is also epileptic and had 2 seizures last night. Pt denies complaints at this time.

## 2018-12-27 ENCOUNTER — Telehealth: Payer: Self-pay | Admitting: Neurology

## 2018-12-27 NOTE — Telephone Encounter (Signed)
Caller states her husband collapsed on the floor, LOC x 4 minutes and has some confusion now, he did hit the back of his head, before he fell he was not making sense and his mouth twitching like a seizure, hx of Epilepsy, has had same symptoms in past, denies other symptoms

## 2018-12-27 NOTE — Telephone Encounter (Signed)
Wife states that pt's seizure pattern has changed. He is not having frequent seizures but clusters of seizures.  She believes the seizure medications are not helping. Would like to discuss d/c Onfi and Klonopin. Wife is ok to continue [place].  She states that every since pt had immunotherapy with Keytruda and BCG for bladder cancer his seizures and system has been messed up.  Wife also states that his thyroid is out of wack to. She will speak to Dr. Radene Gunning about this.

## 2018-12-29 ENCOUNTER — Other Ambulatory Visit: Payer: Self-pay | Admitting: Urology

## 2018-12-29 NOTE — Telephone Encounter (Signed)
Replied on MyChart

## 2019-02-23 ENCOUNTER — Other Ambulatory Visit: Payer: Self-pay

## 2019-02-23 ENCOUNTER — Ambulatory Visit: Payer: Medicare Other | Admitting: Neurology

## 2019-02-23 ENCOUNTER — Encounter: Payer: Self-pay | Admitting: Neurology

## 2019-02-23 VITALS — BP 132/65 | HR 75 | Ht 68.0 in | Wt 133.4 lb

## 2019-02-23 DIAGNOSIS — G40219 Localization-related (focal) (partial) symptomatic epilepsy and epileptic syndromes with complex partial seizures, intractable, without status epilepticus: Secondary | ICD-10-CM

## 2019-02-23 NOTE — Patient Instructions (Addendum)
Wishing you well for the upcoming surgery!  1. Continue Zonisamide 300mg  daily and clobazam 5mg  daily  2. Follow-up in 3 months, call for any changes  Seizure Precautions: 1. If medication has been prescribed for you to prevent seizures, take it exactly as directed.  Do not stop taking the medicine without talking to your doctor first, even if you have not had a seizure in a long time.   2. Avoid activities in which a seizure would cause danger to yourself or to others.  Don't operate dangerous machinery, swim alone, or climb in high or dangerous places, such as on ladders, roofs, or girders.  Do not drive unless your doctor says you may.  3. If you have any warning that you may have a seizure, lay down in a safe place where you can't hurt yourself.    4.  No driving for 6 months from last seizure, as per [facility].   Please refer to the following link on the [facility] website for more information: [email or web address]/[email or web address]   5.  Maintain good sleep hygiene. Avoid alcohol.  6.  Contact your doctor if you have any problems that may be related to the medicine you are taking.  7.  Call 911 and bring the patient back to the ED if:        A.  The seizure lasts longer than 5 minutes.       B.  The patient doesn't awaken shortly after the seizure  C.  The patient has new problems such as difficulty seeing, speaking or moving  D.  The patient was injured during the seizure  E.  The patient has a temperature over 102 F (39C)  F.  The patient vomited and now is having trouble breathing

## 2019-02-23 NOTE — Progress Notes (Signed)
NEUROLOGY FOLLOW UP OFFICE NOTE  John Holt [id number] 1945-12-27  HISTORY OF PRESENT ILLNESS: I had the pleasure of seeing John Holt in follow-up in the neurology clinic on 02/23/2019. The patient was last seen 3 months ago for intractable epilepsy. His wife is present to provide additional history. Since his last visit, his wife had contacted our office about a daytime seizure on 8/31 where he collapsed on the floor, this was preceded by some nonsensical speech, lip smacking and moving of his chin, he passed out for 4 minutes with left arm moving slightly. He was very pale and confused after. The next day his wife found him in the bathroom with bowel incontinence, he did not remember what happened and thought he went to sleep. Zonisamide increased to 400mg  qhs. His wife reports that after these his spirits have been very expansive, making all kinds of connections, thinking about God a lot, which she had seen with previous post-ictal mania. He was having diarrhea, and when he had a seizure he would have incontinence, he feels because he was relaxing/unaware. He attributed the diarrhea to increase in Synthroid dose so he stopped it with no effect, he restarted Synthroid and is also taking Imodium and probiotics. No diarrhea the past 3-4 days. He has not collapsed on the floor since August. He has had his typical nocturnal seizures that his wife reports are now very brief, he had one 2 weeks ago, and 2 small ones this week. She does not even feel him moving, but he was walking around the house confused like he was sleepwalking. He is back on Zonisamide 300mg  qhs and clobazam 5mg  qhs. No side effects.   History on Initial Assessment 05/13/2017: This is a very pleasant 73 yo RH man with a history of focal epilepsy of right mesial temporal or right mesial frontal onset. He had been seeing epileptologist Dr. Jacelyn Grip for several years, records were reviewed. He reports staring episodes in childhood. In his  26s, he recalls having episodes of a sinking feeling in his stomach with deja vu occurring around once a month initially. When he was started on seizure medications, these episodes occur rarely, around 1-2 a year. His wife started noticing possible symptoms in his 35s, they were in a meeting and she noticed him swaying back and forth but seemed unaware of it. He started having nocturnal spells more than 15 years ago, where he would have "leg thrusting" (bicycling type leg movements), lip smacking, often followed by arousal with disorientation, need to urinate and walking around (at times resembling sleepwalking). He has had episodes where he would have a cluster of seizures then become paranoid and manic with significant post-ictal psychosis. He usually has nocturnal seizures every 3 weeks or so. His wife would give him prn clonazepam when he has clusters, and this has helped "zap" them. He has not needed clonazepam in at least 1.5 years. He has rare daytime seizures, his wife recalls the seizures in 2006 and 2013 with post-ictal psychosis, there is note of a spell of undressing and confusion in the [employer] at [place] in 2015. In the past he has been on Lamictal, Depakote, Gabapentin. Gabapentin has been tapered off. He has been taking Onfi for several years and feels this has been the most helpful. He was recently diagnosed with bladder cancer in October 2018 and underwent resection and chemotherapy. They report that towards the end of chemo, he had some incidents with seizures. He has no recollection of the  events. His wife reports that after he had his second infusion and the PICC line was taken out, he started having slight feet movements and was staring and unresponsive. He had another episode while shoveling snow, he came in and started talking and his speech became garbled, "words were not right" for 60 seconds. Three days after on the way home from chemo, he had a similar episode with his speech. He was  talking about Aristotle then his speech became garbled. The last daytime event was on 04/28/17. He started swaying side to side, speech was garbled, and he was staring off for a few minutes. Due to these daytime episodes, his Onfi dose was temporarily increased to 40mg  qhs. They report he has not had any nocturnal seizures since November, which is unusual for him. The higher dose of Onfi knocks him out. They report he is done with chemotherapy and will be seeing his oncologist to discuss next steps.  He denies any headaches, dizziness, diplopia, dysarthria/dysphagia, neck/back pain, focal numbness/tingling/weakness, bowel/bladder dysfunction, no falls. He had muscle cramps, taking magnesium supplements has helped. His memory is what bothers him a lot. They mostly notice problems with his "narrative memory." He would not recall watching a movie 2-3 weeks prior. He cannot recall anecdotes. He would make notes on an article, and later find that he had already made notes on the same article previously. He is able to remember abstract concepts from dense philosophy books he reads. His spatial memory is not good. He denies missing medications. Every once in a while he forgets his medications (twice in the past month). He drives only to the grocery and denies getting lost. He has had Neuropsychological testing, report unavailable for review.  Update 12/23/2017: On his last visit, he reported feeling bad, more depressed about his memory, and felt Onfi was contributing a lot to his symptoms. He had a seizure on 09/01/17 during wakefulness. He had a repeat MRI brain on 10/14/17 which showed asymmetric right hippocampal volume loss suggestive of mesial temporal sclerosis. He was started on Zonisamide and did very well with no seizures for 9 weeks (baseline nocturnal seizures every 2 weeks or so). He reduced Onfi to 20mg  and his wife reported he was doing great, his sense of humor was back, he was laughing, at one point his wife  thought the euphoria was almost too much. She noticed this as a big difference from how he was the past couple of years where he had a "flattening" of mood. Unfortunately, after he reduced the Onfi to 10mg  on 12/02/17, his wife noticed almost immediately that his mood was a little darker and he was more irritable. He had a nocturnal seizure on 8/9, then another on 8/14. With each seizure, his mood was going "way, way down." He was instructed in increase Zonisamide to 400mg  qhs. He then had a seizure during the afternoon of 12/18/17. He is tearful in the office today and has some difficulty finding his words, reporting it was the end of a stressful week, he had been having feelings of anxiety that week. He had no warning and is amnestic of the seizure, his wife reports he clasped his hands together in front of him and was doing repetitive digging movements, unresponsive. He then smiled at her and grabbed her arms, jerking her around like he was dancing with her. This lasted 2-3 minutes, then he started answering questions. No focal weakness, tongue bite, or incontinence. He called our office and was instructed to increase  Onfi back to 20mg  daily and Zonisamide down to 300mg  qhs. He has felt normal this week. He has had tingling in his feet with the Zonisamide, this has quieted down but not completely gone away. He denies any headaches, dizziness, vision changes, focal weakness, no falls.   Prior AEDs: Depakote, Lamictal, Tegretol, Trileptal, Keppra, Vimpat, Gabapentin  Epilepsy Risk Factors: His father had rare spells 1-2 times a year of "zonking out." He and his brother had "spasms" as babies, none after age 59/3. He recalls having a head injury in his teenage years. Otherwise he had a normal birth and early development, no history of febrile convulsions, CNS infections, or neurosurgical procedures.   Prior workup: MRI brain 01/2014: subtle findings consistent with right mesial temporal sclerosis EEGs: EEG  2009 - right temporal spike activity and right TIRDA EEG 01/2009 - normal EEG 07/2012 - normal  MRI 2006 - normal  PET Brain 2016 - right mesial temporal hypometabolism.  EMU 04/25/2014 - 05/04/2014 6 events captured on camera, 1 off camera - repeated neck extension and flexion, followed by pelvic thrusting and body rocking movements. Appear to localize to the right temporal region, although clinically suggestive of frontal onset. Patient taken off Depakote and Lamictal and switched to high dose gabapentin  Neuropsychological testing in June 2019 indicated mild neurocognitive disorder due to seizure disorder. Testing and daily functioning not consistent with dementia. Testing revealed mostly normal cognitive functioning. However, he did demonstrate mildly reduced visual-spatial construction and more impaired visual memory, consistent with right hemisphere involvement and in particular mesial temporal lobe involvement. He also demonstrated impairment in verbal memory of non-contextual information, but memory of contextual information was intact, suggesting more of a problem with frontal-subcortical networks than hippocampal consolidation dysfunction.   PAST MEDICAL HISTORY: Past Medical History:  Diagnosis Date   Anxiety disorder    Bipolar 1 disorder, mixed ([place])    w/ hx psychosis/ dissociation reactions/ suicide ideattions and attempt   Bladder cancer [facility]) urologist-- dr Alyson Ingles ([facility]) and dr Lawerance Bach [facility]):  oncologist-  dr Alen Blew ([facility]) & dr Marcello Moores [facility])   dx 2018--- s/p  TURBT's ;  hx BCG tx's.  chemotherapy completed 2019, and immunotherapy Keytruda , last one 10-21-2018   BPH (benign prostatic hypertrophy) with urinary obstruction    History of adenomatous polyp of colon    History of closed head injury    2000-- fell off ladder--  temporary memory loss resolved   History of psychosis    x2  last one documented 2006  w/ hallucination  and suicide  ideation   Nocturia    Port-A-Cath in place 2018   S/P placement of VNS (vagus nerve stimulation) device 01/28/2018   per pt swipe magnet twice daily   Sigmoid diverticulosis    Sinus bradycardia seen on cardiac monitor    cardiology --  dr g. taylor;   event monitor results 11-15-2018 in epic,  NSR w/ ST and SB and a nocturnal pause    Temporal lobe epilepsy syndrome [facility]) neurologist-  dr Ala Bent-  avergae one every 2 weeks "legs jerking around, per wife , pt unaware he's doing this"   localization-related right temporal lobe --  mostly nocturnal w/ bilateral lower extremitity movement, staring and moaning then dissorietation afterwards (12-02-2018 per pt last daytime seizure 11-11-2018 and last nighttime seizure 11-29-2018   Vesicoureteral reflux, bilateral    Wears glasses     MEDICATIONS: Current Outpatient Medications on File Prior to Visit  Medication  Sig Dispense Refill   cloBAZam (ONFI) 10 MG tablet Take 1/2 tablet every night (Patient taking differently: Take 5 mg by mouth See admin instructions. Take 5 mg at night, may increase to 10 mg if having more than 2 seizures within 24 hours) 135 tablet 3   clonazePAM (KLONOPIN) 0.5 MG tablet Take 1 tablet (0.5 mg total) by mouth daily as needed (epilepsy). 30 tablet 3   levothyroxine (SYNTHROID) 125 MCG tablet Take 125 mcg by mouth daily before breakfast.     loperamide (IMODIUM A-D) 2 MG tablet Take 2 mg by mouth 4 (four) times daily as needed for diarrhea or loose stools.     Probiotic CAPS Take 1 capsule by mouth daily.     tadalafil (ADCIRCA/CIALIS) 20 MG tablet Take 20 mg by mouth daily as needed for erectile dysfunction.      traMADol (ULTRAM) 50 MG tablet Take 1 tablet (50 mg total) by mouth every 6 (six) hours as needed for moderate pain. 15 tablet 0   zonisamide (ZONEGRAN) 100 MG capsule TAKE 3 CAPSULES EACH NIGHT (Patient taking differently: Take 300 mg by mouth at bedtime. ) 270 capsule 3   No current  facility-administered medications on file prior to visit.     ALLERGIES: Allergies  Allergen Reactions   Chlorhexidine Rash    Full body   Povidone-Iodine Rash    FAMILY HISTORY: Family History  Problem Relation Age of Onset   Seizures Father    Heart disease Father    Cancer Father        prostat   Cancer Mother        stomach   Stomach cancer Mother    Colon cancer Neg Hx    Colon polyps Neg Hx    Esophageal cancer Neg Hx    Rectal cancer Neg Hx     SOCIAL HISTORY: Social History   Socioeconomic History   Marital status: Married    Spouse name: [name]   Number of children: 2   Years of education: PhD   Highest education level: Not on file  Occupational History    Employer: [employer]    Comment: Professor, Games developer  Social Needs   Financial resource strain: Not on file   Food insecurity    Worry: Not on file    Inability: Not on file   Transportation needs    Medical: Not on file    Non-medical: Not on file  Tobacco Use   Smoking status: Former Smoker    Packs/day: 1.00    Years: 15.00    Pack years: 15.00    Types: Cigarettes    Quit date: 04/03/1972    Years since quitting: 46.9   Smokeless tobacco: Never Used  Substance and Sexual Activity   Alcohol use: Yes    Alcohol/week: 14.0 standard drinks    Types: 14 Glasses of wine per week    Comment: 1 or 2  wine daily; no consumption in last 24 hours   Drug use: No   Sexual activity: Yes    Partners: Female  Lifestyle   Physical activity    Days per week: Not on file    Minutes per session: Not on file   Stress: Not on file  Relationships   Social connections    Talks on phone: Not on file    Gets together: Not on file    Attends religious service: Not on file    Active member of club or organization: Not on  file    Attends meetings of clubs or organizations: Not on file    Relationship status: Not on file   Intimate partner violence    Fear of current  or ex partner: Not on file    Emotionally abused: Not on file    Physically abused: Not on file    Forced sexual activity: Not on file  Other Topics Concern   Not on file  Social History Narrative   Pt lives at home with his wife. One story home.      Left handed      Phd in English      Caffeine Use- 2 cups daily    REVIEW OF SYSTEMS: Constitutional: No fevers, chills, or sweats, no generalized fatigue, change in appetite Eyes: No visual changes, double vision, eye pain Ear, nose and throat: No hearing loss, ear pain, nasal congestion, sore throat Cardiovascular: No chest pain, palpitations Respiratory:  No shortness of breath at rest or with exertion, wheezes GastrointestinaI: No nausea, vomiting, diarrhea, abdominal pain, fecal incontinence Genitourinary:  No dysuria, urinary retention or frequency Musculoskeletal:  No neck pain, back pain Integumentary: No rash, pruritus, skin lesions Neurological: as above Psychiatric: No depression, insomnia, anxiety Endocrine: No palpitations, fatigue, diaphoresis, mood swings, change in appetite, change in weight, increased thirst Hematologic/Lymphatic:  No anemia, purpura, petechiae. Allergic/Immunologic: no itchy/runny eyes, nasal congestion, recent allergic reactions, rashes  PHYSICAL EXAM: Vitals:   02/23/19 1410  BP: 132/65  Pulse: 75  SpO2: 100%   General: No acute distress Head:  Normocephalic/atraumatic Skin/Extremities: No rash, no edema Neurological Exam: alert and oriented to person, place, and time. No aphasia or dysarthria. Fund of knowledge is appropriate.  Recent and remote memory are intact.  Attention and concentration are normal.    Able to name objects and repeat phrases. Cranial nerves: Pupils equal, round, reactive to light. Extraocular movements intact with no nystagmus. No facial asymmetry.Motor: moves all extremities symmetrically. Gait narrow-based and steady.  VNS Therapy Management: Parameters Output  Current (mA): 1.75 Signal Frequency (Hz): 20 Pulse Width (usec): 250 Signal ON Time (sec): 30 Signal OFF Time (min): 5 Magnet Output Current (mA): 2 Magnet ON Time (sec): 60 Magnet Pulse Width (usec): 250 AutoStim Output Current (mA): 1.875 AutoStim Pulse Width (usec): 250 AutoStim ON Time (sec): 30 Tachycardia Detection : On Heartbeat Detection Sensitivity: 1 Perform Verify Heartbeat Detection: yes Threshold for AutoStim (%): 20  IMPRESSION: This is a very pleasant 73 yo RH man with a history of focal epilepsy of right mesial temporal or right mesial frontal onset. Repeat MRI brain showed right hippocampal asymmetry suggestive of mesial temporal sclerosis. PET scan showed right mesial temporal hypometabolism. EEGs in the past showed right temporal spikes and TIRDA, EMU admission captured seizures that appeared to localize to the right temporal area although clinically suggestive of frontal onset. Last daytime seizure was 8/31, he has had milder nocturnal seizures. VNS adjusted today with 3 parameters changed, output current increased to 1.75 mA. Continue Zonisamide 300mg  qhs and clobazam 5mg  qhs. He will be undergoing bladder surgery. He is not driving. He will follow-up in 3 months and knows to call for any changes.   Thank you for allowing me to participate in his care.  Please do not hesitate to call for any questions or concerns.   Ellouise Newer, M.D.   CC: Dr. Dagmar Hait

## 2019-02-24 ENCOUNTER — Other Ambulatory Visit: Payer: Self-pay | Admitting: Urology

## 2019-02-24 ENCOUNTER — Encounter (HOSPITAL_COMMUNITY): Payer: Self-pay

## 2019-02-24 ENCOUNTER — Inpatient Hospital Stay (HOSPITAL_COMMUNITY)
Admission: AD | Admit: 2019-02-24 | Discharge: 2019-03-02 | DRG: 654 | Disposition: A | Discharge status: Home Health | Payer: Medicare Other | Attending: Urology | Admitting: Urology

## 2019-02-24 ENCOUNTER — Other Ambulatory Visit: Payer: Self-pay

## 2019-02-24 DIAGNOSIS — C679 Malignant neoplasm of bladder, unspecified: Secondary | ICD-10-CM | POA: Diagnosis present

## 2019-02-24 DIAGNOSIS — Z79899 Other long term (current) drug therapy: Secondary | ICD-10-CM | POA: Diagnosis not present

## 2019-02-24 DIAGNOSIS — K567 Ileus, unspecified: Secondary | ICD-10-CM | POA: Diagnosis not present

## 2019-02-24 DIAGNOSIS — E039 Hypothyroidism, unspecified: Secondary | ICD-10-CM | POA: Diagnosis not present

## 2019-02-24 DIAGNOSIS — Z800 Family history of malignant neoplasm of digestive organs: Secondary | ICD-10-CM | POA: Diagnosis not present

## 2019-02-24 DIAGNOSIS — Z20828 Contact with and (suspected) exposure to other viral communicable diseases: Secondary | ICD-10-CM | POA: Diagnosis not present

## 2019-02-24 DIAGNOSIS — Z79890 Hormone replacement therapy (postmenopausal): Secondary | ICD-10-CM

## 2019-02-24 DIAGNOSIS — Z87891 Personal history of nicotine dependence: Secondary | ICD-10-CM

## 2019-02-24 DIAGNOSIS — G40909 Epilepsy, unspecified, not intractable, without status epilepticus: Secondary | ICD-10-CM | POA: Diagnosis present

## 2019-02-24 DIAGNOSIS — L539 Erythematous condition, unspecified: Secondary | ICD-10-CM | POA: Diagnosis not present

## 2019-02-24 DIAGNOSIS — N401 Enlarged prostate with lower urinary tract symptoms: Secondary | ICD-10-CM | POA: Diagnosis not present

## 2019-02-24 DIAGNOSIS — Z936 Other artificial openings of urinary tract status: Secondary | ICD-10-CM

## 2019-02-24 DIAGNOSIS — Z8249 Family history of ischemic heart disease and other diseases of the circulatory system: Secondary | ICD-10-CM | POA: Diagnosis not present

## 2019-02-24 DIAGNOSIS — K4090 Unilateral inguinal hernia, without obstruction or gangrene, not specified as recurrent: Secondary | ICD-10-CM | POA: Diagnosis present

## 2019-02-24 DIAGNOSIS — F419 Anxiety disorder, unspecified: Secondary | ICD-10-CM | POA: Diagnosis not present

## 2019-02-24 DIAGNOSIS — Z419 Encounter for procedure for purposes other than remedying health state, unspecified: Secondary | ICD-10-CM

## 2019-02-24 DIAGNOSIS — F3160 Bipolar disorder, current episode mixed, unspecified: Secondary | ICD-10-CM | POA: Diagnosis present

## 2019-02-24 LAB — CBC
HCT: 42 % (ref 39.0–52.0)
Hemoglobin: 13.4 g/dL (ref 13.0–17.0)
MCH: 30.2 pg (ref 26.0–34.0)
MCHC: 31.9 g/dL (ref 30.0–36.0)
MCV: 94.8 fL (ref 80.0–100.0)
Platelets: 195 10*3/uL (ref 150–400)
RBC: 4.43 MIL/uL (ref 4.22–5.81)
RDW: 15.4 % (ref 11.5–15.5)
WBC: 5.1 10*3/uL (ref 4.0–10.5)
nRBC: 0 % (ref 0.0–0.2)

## 2019-02-24 LAB — COMPREHENSIVE METABOLIC PANEL
ALT: 23 U/L (ref 0–44)
AST: 25 U/L (ref 15–41)
Albumin: 4.3 g/dL (ref 3.5–5.0)
Alkaline Phosphatase: 68 U/L (ref 38–126)
Anion gap: 9 (ref 5–15)
BUN: 21 mg/dL (ref 8–23)
CO2: 23 mmol/L (ref 22–32)
Calcium: 8.9 mg/dL (ref 8.9–10.3)
Chloride: 107 mmol/L (ref 98–111)
Creatinine, Ser: 1.01 mg/dL (ref 0.61–1.24)
GFR calc Af Amer: >60 mL/min (ref >60)
GFR calc non Af Amer: >60 mL/min (ref >60)
Glucose, Bld: 90 mg/dL (ref 70–99)
Potassium: 3.7 mmol/L (ref 3.5–5.1)
Sodium: 139 mmol/L (ref 135–145)
Total Bilirubin: 0.8 mg/dL (ref 0.3–1.2)
Total Protein: 6.5 g/dL (ref 6.5–8.1)

## 2019-02-24 LAB — SARS CORONAVIRUS 2 BY RT PCR (HOSPITAL ORDER, PERFORMED IN ~~LOC~~ HOSPITAL LAB): SARS Coronavirus 2: NEGATIVE

## 2019-02-24 LAB — MRSA PCR SCREENING: MRSA by PCR: NEGATIVE

## 2019-02-24 MED ORDER — LEVOTHYROXINE SODIUM 25 MCG PO TABS
125.0000 ug | ORAL_TABLET | Freq: Every day | ORAL | Status: DC
  Administered 2019-02-25 – 2019-03-02 (×6): 125 ug via ORAL
  Filled 2019-02-24 (×6): qty 1

## 2019-02-24 MED ORDER — PEG 3350-KCL-NA BICARB-NACL 420 G PO SOLR
4000.0000 mL | Freq: Once | ORAL | Status: AC
  Administered 2019-02-24: 4000 mL via ORAL

## 2019-02-24 MED ORDER — PIPERACILLIN-TAZOBACTAM 3.375 G IVPB 30 MIN
3.3750 g | INTRAVENOUS | Status: AC
  Administered 2019-02-25: 3.375 g via INTRAVENOUS
  Filled 2019-02-24 (×2): qty 50

## 2019-02-24 MED ORDER — CLOBAZAM 10 MG PO TABS
5.0000 mg | ORAL_TABLET | Freq: Every day | ORAL | Status: DC
  Administered 2019-02-24 – 2019-03-01 (×6): 5 mg via ORAL
  Filled 2019-02-24 (×6): qty 1

## 2019-02-24 MED ORDER — ZONISAMIDE 100 MG PO CAPS
300.0000 mg | ORAL_CAPSULE | Freq: Every day | ORAL | Status: DC
  Administered 2019-02-24 – 2019-03-01 (×6): 300 mg via ORAL
  Filled 2019-02-24 (×8): qty 3

## 2019-02-24 MED ORDER — METRONIDAZOLE 500 MG PO TABS
500.0000 mg | ORAL_TABLET | ORAL | Status: AC
  Administered 2019-02-24 (×3): 500 mg via ORAL
  Filled 2019-02-24 (×3): qty 1

## 2019-02-24 MED ORDER — ALVIMOPAN 12 MG PO CAPS
12.0000 mg | ORAL_CAPSULE | ORAL | Status: AC
  Administered 2019-02-25: 12 mg via ORAL
  Filled 2019-02-24: qty 1

## 2019-02-24 MED ORDER — CLONAZEPAM 0.5 MG PO TABS: 0.5000 mg | ORAL_TABLET | Freq: Every day | ORAL | Status: DC | PRN

## 2019-02-24 MED ORDER — NEOMYCIN SULFATE 500 MG PO TABS
1000.0000 mg | ORAL_TABLET | ORAL | Status: AC
  Administered 2019-02-24 (×3): 1000 mg via ORAL
  Filled 2019-02-24 (×3): qty 2

## 2019-02-24 NOTE — Consult Note (Signed)
[facility] Nurse requested for preoperative stoma site marking  Discussed surgical procedure and stoma creation with patient.   Explained role of the [facility] nurse team.  Provided the patient with educational booklet and provided samples of pouching options.   Examined patient lying, sitting, and standing in order to place the marking in the patient's visual field, away from any creases or abdominal contour issues and within the rectus muscle.     Marked for ileal conduit in the RLQ _4___  cm to the right of the umbilicus and  [id number] cm below the umbilicus.   Patient's abdomen cleansed with CHG wipes at site markings, allowed to air dry prior to marking.Covered mark with thin film transparent dressing to preserve mark until date of surgery.   [facility] Nurse team will follow up with patient after surgery for continue ostomy care and teaching.  [place] MSN, [place], [place], [place]

## 2019-02-24 NOTE — H&P (Signed)
John Holt is an 73 y.o. male.    Chief Complaint: Pre-OP Cystectomy  HPI:   1 - Muscle Invasive Bladder Cancer - pt has refused cystectomy x several and therefore had very atypical course.  Very Unusual Course as follows:  2018 - T2G3 by Rosana Hoes at [facility]. Given MVAC x 4  2018-2020 - Keytruda  08/2018 - induciton BCG  09/2018 - CT no mets.  11/2018 - T2G3 by TURBT, CT clinically localized   2 -Right Indirect Inguinal Hernia - indrect fat-containing hernia incidetnal on CT x several. Non-palpable.   PMH sig for anxiety / benzos, epilepsy (vagal nerve stimltor, meds, some memory loss, follows Dr. Leonor Liv), chornic dry couge from vagal nerve stimulator, hypothyroid, lower abdominal heria repair (mesh tacs likely all infraumbilical). His PCP Is Berneta Sages MD. 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 His wife "[name]" is very involved.   Today " [name] " is seen as pre-op admission prior to cystectomy tomorrow.   Past Medical History:  Diagnosis Date  . Anxiety disorder   . Bipolar 1 disorder, mixed ([place])    w/ hx psychosis/ dissociation reactions/ suicide ideattions and attempt  . Bladder cancer [facility]) urologist-- dr Alyson Ingles ([facility]) and dr Lawerance Bach [facility]):  oncologist-  dr Alen Blew ([facility]) & dr Marcello Moores [facility])   dx 2018--- s/p  TURBT's ;  hx BCG tx's.  chemotherapy completed 2019, and immunotherapy Keytruda , last one 10-21-2018  . BPH (benign prostatic hypertrophy) with urinary obstruction   . History of adenomatous polyp of colon   . History of closed head injury    2000-- fell off ladder--  temporary memory loss resolved  . History of psychosis    x2  last one documented 2006  w/ hallucination  and suicide ideation  . Nocturia   . Port-A-Cath in place 2018  . S/P placement of VNS (vagus nerve stimulation) device 01/28/2018   per pt swipe magnet twice daily  . Sigmoid diverticulosis   . Sinus bradycardia seen on cardiac monitor    cardiology --  dr g. taylor;    event monitor results 11-15-2018 in epic,  NSR w/ ST and SB and a nocturnal pause   . Temporal lobe epilepsy syndrome [facility]) neurologist-  dr Raliegh Ip. Delice Lesch-  avergae one every 2 weeks "legs jerking around, per wife , pt unaware he's doing this"   localization-related right temporal lobe --  mostly nocturnal w/ bilateral lower extremitity movement, staring and moaning then dissorietation afterwards (12-02-2018 per pt last daytime seizure 11-11-2018 and last nighttime seizure 11-29-2018  . Vesicoureteral reflux, bilateral   . Wears glasses     Past Surgical History:  Procedure Laterality Date  . COLONOSCOPY    . COLONOSCOPY W/ POLYPECTOMY  09-11-2009  . CYSTOSCOPY W/ RETROGRADES Bilateral 08/13/2018   Procedure: CYSTOSCOPY WITH RETROGRADE PYELOGRAM;  Surgeon: Cleon Gustin, MD;  Location: [facility];  Service: Urology;  Laterality: Bilateral;  . GREEN LIGHT LASER TURP (TRANSURETHRAL RESECTION OF PROSTATE N/A 04/07/2014   Procedure: GREEN LIGHT LASER TURP (TRANSURETHRAL RESECTION OF PROSTATE;  Surgeon: Ailene Rud, MD;  Location: [facility];  Service: Urology;  Laterality: N/A;  . INGUINAL HERNIA REPAIR Bilateral 04/15/2013   Procedure: LAPAROSCOPIC BILATERAL INGUINAL HERNIA REPAIR;  Surgeon: Gayland Curry, MD;  Location: [facility];  Service: General;  Laterality: Bilateral;  . INSERTION OF MESH N/A 04/15/2013   Procedure: INSERTION OF MESH;  Surgeon: Gayland Curry, MD;  Location: [facility];  Service: General;  Laterality: N/A;  . PORTACATH PLACEMENT  2018  . TRANSURETHRAL RESECTION OF BLADDER  01-27-2017;  05-26-2017;  05-25-2018  both by dr Chriss Czar davis @[facility]   . TRANSURETHRAL RESECTION OF BLADDER TUMOR N/A 08/13/2018   Procedure: TRANSURETHRAL RESECTION OF BLADDER TUMOR (TURBT);  Surgeon: Cleon Gustin, MD;  Location: [facility];  Service: Urology;  Laterality: N/A;  1 HR  . TRANSURETHRAL RESECTION OF BLADDER TUMOR N/A 12/03/2018   Procedure: TRANSURETHRAL RESECTION OF BLADDER  TUMOR (TURBT);  Surgeon: Cleon Gustin, MD;  Location: [facility];  Service: Urology;  Laterality: N/A;  . UMBILICAL HERNIA REPAIR N/A 04/15/2013   Procedure: OPEN HERNIA REPAIR UMBILICAL ADULT;  Surgeon: Gayland Curry, MD;  Location: [facility];  Service: General;  Laterality: N/A;  . VAGUS NERVE STIMULATOR INSERTION Left 01/28/2018   Procedure: VAGAL NERVE STIMULATOR PLACEMENT;  Surgeon: Consuella Lose, MD;  Location: [place];  Service: Neurosurgery;  Laterality: Left;  VAGAL NERVE STIMULATOR PLACEMENT    Family History  Problem Relation Age of Onset  . Seizures Father   . Heart disease Father   . Cancer Father        prostat  . Cancer Mother        stomach  . Stomach cancer Mother   . Colon cancer Neg Hx   . Colon polyps Neg Hx   . Esophageal cancer Neg Hx   . Rectal cancer Neg Hx    Social History:  reports that he quit smoking about 46 years ago. His smoking use included cigarettes. He has a 15.00 pack-year smoking history. He has never used smokeless tobacco. He reports current alcohol use of about 14.0 standard drinks of alcohol per week. He reports that he does not use drugs.  Allergies:  Allergies  Allergen Reactions  . Chlorhexidine Rash    Full body  . Povidone-Iodine Rash    Medications Prior to Admission  Medication Sig Dispense Refill  . cloBAZam (ONFI) 10 MG tablet Take 1/2 tablet every night (Patient taking differently: Take 5 mg by mouth See admin instructions. Take 5 mg at night, may increase to 10 mg if having more than 2 seizures within 24 hours) 135 tablet 3  . clonazePAM (KLONOPIN) 0.5 MG tablet Take 1 tablet (0.5 mg total) by mouth daily as needed (epilepsy). 30 tablet 3  . levothyroxine (SYNTHROID) 125 MCG tablet Take 125 mcg by mouth daily before breakfast.    . loperamide (IMODIUM A-D) 2 MG tablet Take 2 mg by mouth 4 (four) times daily as needed for diarrhea or loose stools.    . Probiotic CAPS Take 1 capsule by mouth daily.    .  tadalafil (ADCIRCA/CIALIS) 20 MG tablet Take 20 mg by mouth daily as needed for erectile dysfunction.     [name] zonisamide (ZONEGRAN) 100 MG capsule TAKE 3 CAPSULES EACH NIGHT (Patient taking differently: Take 300 mg by mouth at bedtime. ) 270 capsule 3  . traMADol (ULTRAM) 50 MG tablet Take 1 tablet (50 mg total) by mouth every 6 (six) hours as needed for moderate pain. 15 tablet 0    No results found for this or any previous visit (from the past 48 hour(s)). No results found.  ROS  Blood pressure 122/69, pulse 64, temperature 97.9 F (36.6 C), temperature source Oral, resp. rate 16, height 5\' 8"  (1.727 m), weight 58.8 kg, SpO2 100 %. Physical Exam   Assessment/Plan  CMP,CBC, stomal marking, bowel prep, clears, NPO p MN in preaparation  for curative intent cystectomy tomorrow. Risks, benefits, alternatives, expected peri-op course discussed previously and reiterated today. Also reiterated importance of wife staying with him post-op if at all possible given his multiple neurologic and psychniatirc diagnoses as he is at signifcant risk for delirium.   Alexis Frock, MD 02/24/2019, 4:17 PM

## 2019-02-24 NOTE — Discharge Instructions (Signed)

## 2019-02-24 NOTE — Anesthesia Preprocedure Evaluation (Addendum)
Anesthesia Evaluation  Patient identified by MRN, date of birth, ID band Patient awake    Reviewed: Allergy & Precautions, NPO status , Patient's Chart, lab work & pertinent test results  Airway Mallampati: II  TM Distance: >3 FB     Dental  (+) Dental Advisory Given   Pulmonary former smoker,    breath sounds clear to auscultation       Cardiovascular negative cardio ROS   Rhythm:Regular Rate:Normal     Neuro/Psych Seizures -, Well Controlled,  PSYCHIATRIC DISORDERS Anxiety Bipolar Disorder    GI/Hepatic negative GI ROS, Neg liver ROS,   Endo/Other  negative endocrine ROS  Renal/GU Bladder CA     Musculoskeletal   Abdominal   Peds  Hematology   Anesthesia Other Findings   Reproductive/Obstetrics                            Lab Results  Component Value Date   WBC 5.1 02/24/2019   HGB 13.4 02/24/2019   HCT 42.0 02/24/2019   MCV 94.8 02/24/2019   PLT 195 02/24/2019   Lab Results  Component Value Date   CREATININE 1.01 02/24/2019   BUN 21 02/24/2019   NA 139 02/24/2019   K 3.7 02/24/2019   CL 107 02/24/2019   CO2 23 02/24/2019    Anesthesia Physical Anesthesia Plan  ASA: III  Anesthesia Plan: General   Post-op Pain Management:    Induction: Intravenous  PONV Risk Score and Plan: 2 and Dexamethasone, Ondansetron and Treatment may vary due to age or medical condition  Airway Management Planned: Oral ETT  Additional Equipment:   Intra-op Plan:   Post-operative Plan: Extubation in OR and Possible Post-op intubation/ventilation  Informed Consent: I have reviewed the patients History and Physical, chart, labs and discussed the procedure including the risks, benefits and alternatives for the proposed anesthesia with the patient or authorized representative who has indicated his/her understanding and acceptance.     Dental advisory given  Plan Discussed with:  CRNA  Anesthesia Plan Comments:        Anesthesia Quick Evaluation

## 2019-02-24 NOTE — Plan of Care (Signed)
Patient has been oriented to unit. All belongings are within reach. Pt will call for assistance when getting out of bed

## 2019-02-25 ENCOUNTER — Inpatient Hospital Stay (HOSPITAL_COMMUNITY): Payer: Medicare Other | Admitting: Certified Registered Nurse Anesthetist

## 2019-02-25 ENCOUNTER — Inpatient Hospital Stay (HOSPITAL_COMMUNITY): Payer: Medicare Other

## 2019-02-25 ENCOUNTER — Encounter (HOSPITAL_COMMUNITY)
Admission: AD | Disposition: A | Discharge status: Home Health | Payer: Self-pay | Source: Home / Self Care | Attending: Urology

## 2019-02-25 ENCOUNTER — Encounter (HOSPITAL_COMMUNITY): Payer: Self-pay | Admitting: Emergency Medicine

## 2019-02-25 DIAGNOSIS — Z936 Other artificial openings of urinary tract status: Secondary | ICD-10-CM

## 2019-02-25 DIAGNOSIS — C679 Malignant neoplasm of bladder, unspecified: Secondary | ICD-10-CM | POA: Diagnosis present

## 2019-02-25 HISTORY — PX: CYSTOSCOPY WITH INJECTION: SHX1424

## 2019-02-25 LAB — TYPE AND SCREEN
ABO/RH(D): A POS
Antibody Screen: NEGATIVE

## 2019-02-25 LAB — GLUCOSE, CAPILLARY
Glucose-Capillary: 132 mg/dL — ABNORMAL HIGH (ref 70–99)
Glucose-Capillary: 134 mg/dL — ABNORMAL HIGH (ref 70–99)
Glucose-Capillary: 141 mg/dL — ABNORMAL HIGH (ref 70–99)

## 2019-02-25 LAB — HEMOGLOBIN AND HEMATOCRIT, BLOOD
HCT: 38.5 % — ABNORMAL LOW (ref 39.0–52.0)
Hemoglobin: 12.3 g/dL — ABNORMAL LOW (ref 13.0–17.0)

## 2019-02-25 LAB — ABO/RH: ABO/RH(D): A POS

## 2019-02-25 SURGERY — ROBOT ASSISTED LAPAROSCOPIC RADICAL CYSTOPROSTATECTOMY BILATERAL PELVIC LYMPHADENECTOMY,ORTHOTOPIC NEOBLADDER
Anesthesia: General

## 2019-02-25 MED ORDER — PHENYLEPHRINE 40 MCG/ML (10ML) SYRINGE FOR IV PUSH (FOR BLOOD PRESSURE SUPPORT)
PREFILLED_SYRINGE | INTRAVENOUS | Status: DC | PRN
  Administered 2019-02-25: 80 ug via INTRAVENOUS

## 2019-02-25 MED ORDER — LIDOCAINE 2% (20 MG/ML) 5 ML SYRINGE
INTRAMUSCULAR | Status: AC
  Filled 2019-02-25: qty 5

## 2019-02-25 MED ORDER — FENTANYL CITRATE (PF) 100 MCG/2ML IJ SOLN
INTRAMUSCULAR | Status: AC
  Filled 2019-02-25: qty 2

## 2019-02-25 MED ORDER — FENTANYL CITRATE (PF) 250 MCG/5ML IJ SOLN
INTRAMUSCULAR | Status: AC
  Filled 2019-02-25: qty 5

## 2019-02-25 MED ORDER — LACTATED RINGERS IR SOLN
Status: DC | PRN
  Administered 2019-02-25: 1000 mL

## 2019-02-25 MED ORDER — LIDOCAINE 2% (20 MG/ML) 5 ML SYRINGE
INTRAMUSCULAR | Status: DC | PRN
  Administered 2019-02-25: 60 mg via INTRAVENOUS

## 2019-02-25 MED ORDER — DEXAMETHASONE SODIUM PHOSPHATE 10 MG/ML IJ SOLN
INTRAMUSCULAR | Status: DC | PRN
  Administered 2019-02-25: 10 mg via INTRAVENOUS

## 2019-02-25 MED ORDER — BUPIVACAINE LIPOSOME 1.3 % IJ SUSP
20.0000 mL | Freq: Once | INTRAMUSCULAR | Status: AC
  Administered 2019-02-25: 20 mL
  Filled 2019-02-25: qty 20

## 2019-02-25 MED ORDER — ONDANSETRON HCL 4 MG/2ML IJ SOLN: 4.0000 mg | INTRAMUSCULAR | Status: DC | PRN

## 2019-02-25 MED ORDER — WATER FOR IRRIGATION, STERILE IR SOLN
Status: DC | PRN
  Administered 2019-02-25: 3000 mL via URETHRAL

## 2019-02-25 MED ORDER — SODIUM CHLORIDE (PF) 0.9 % IJ SOLN
INTRAMUSCULAR | Status: AC
  Filled 2019-02-25: qty 20

## 2019-02-25 MED ORDER — SUGAMMADEX SODIUM 200 MG/2ML IV SOLN
INTRAVENOUS | Status: DC | PRN
  Administered 2019-02-25: 250 mg via INTRAVENOUS

## 2019-02-25 MED ORDER — PHENYLEPHRINE HCL (PRESSORS) 10 MG/ML IV SOLN
INTRAVENOUS | Status: AC
  Filled 2019-02-25: qty 2

## 2019-02-25 MED ORDER — MIDAZOLAM HCL 2 MG/2ML IJ SOLN
INTRAMUSCULAR | Status: AC
  Filled 2019-02-25: qty 2

## 2019-02-25 MED ORDER — ORAL CARE MOUTH RINSE
15.0000 mL | Freq: Two times a day (BID) | OROMUCOSAL | Status: DC
  Administered 2019-02-26 – 2019-03-01 (×8): 15 mL via OROMUCOSAL

## 2019-02-25 MED ORDER — FENTANYL CITRATE (PF) 250 MCG/5ML IJ SOLN
INTRAMUSCULAR | Status: DC | PRN
  Administered 2019-02-25 (×4): 50 ug via INTRAVENOUS
  Administered 2019-02-25: 100 ug via INTRAVENOUS
  Administered 2019-02-25: 50 ug via INTRAVENOUS

## 2019-02-25 MED ORDER — DIPHENHYDRAMINE HCL 12.5 MG/5ML PO ELIX
12.5000 mg | ORAL_SOLUTION | Freq: Four times a day (QID) | ORAL | Status: DC | PRN
  Administered 2019-02-28: 12.5 mg via ORAL
  Filled 2019-02-25: qty 5

## 2019-02-25 MED ORDER — ROCURONIUM BROMIDE 10 MG/ML (PF) SYRINGE
PREFILLED_SYRINGE | INTRAVENOUS | Status: AC
  Filled 2019-02-25: qty 10

## 2019-02-25 MED ORDER — PROMETHAZINE HCL 25 MG/ML IJ SOLN: 6.2500 mg | INTRAMUSCULAR | Status: DC | PRN

## 2019-02-25 MED ORDER — PROPOFOL 10 MG/ML IV BOLUS
INTRAVENOUS | Status: AC
  Filled 2019-02-25: qty 20

## 2019-02-25 MED ORDER — ONDANSETRON HCL 4 MG/2ML IJ SOLN
INTRAMUSCULAR | Status: DC | PRN
  Administered 2019-02-25: 4 mg via INTRAVENOUS

## 2019-02-25 MED ORDER — SUCCINYLCHOLINE CHLORIDE 200 MG/10ML IV SOSY
PREFILLED_SYRINGE | INTRAVENOUS | Status: AC
  Filled 2019-02-25: qty 10

## 2019-02-25 MED ORDER — ALBUMIN HUMAN 5 % IV SOLN
INTRAVENOUS | Status: DC | PRN
  Administered 2019-02-25: 12:00:00 via INTRAVENOUS

## 2019-02-25 MED ORDER — ROCURONIUM BROMIDE 10 MG/ML (PF) SYRINGE
PREFILLED_SYRINGE | INTRAVENOUS | Status: DC | PRN
  Administered 2019-02-25: 40 mg via INTRAVENOUS
  Administered 2019-02-25: 20 mg via INTRAVENOUS
  Administered 2019-02-25: 40 mg via INTRAVENOUS
  Administered 2019-02-25: 30 mg via INTRAVENOUS
  Administered 2019-02-25: 40 mg via INTRAVENOUS

## 2019-02-25 MED ORDER — LIDOCAINE HCL 2 % IJ SOLN
INTRAMUSCULAR | Status: AC
  Filled 2019-02-25: qty 20

## 2019-02-25 MED ORDER — SODIUM CHLORIDE (PF) 0.9 % IJ SOLN
INTRAMUSCULAR | Status: DC | PRN
  Administered 2019-02-25: 20 mL

## 2019-02-25 MED ORDER — FENTANYL CITRATE (PF) 100 MCG/2ML IJ SOLN
25.0000 ug | INTRAMUSCULAR | Status: DC | PRN
  Administered 2019-02-25 (×2): 50 ug via INTRAVENOUS

## 2019-02-25 MED ORDER — PROPOFOL 10 MG/ML IV BOLUS
INTRAVENOUS | Status: DC | PRN
  Administered 2019-02-25: 100 mg via INTRAVENOUS

## 2019-02-25 MED ORDER — LACTATED RINGERS IV SOLN
INTRAVENOUS | Status: DC
  Administered 2019-02-25 (×2): via INTRAVENOUS

## 2019-02-25 MED ORDER — LIDOCAINE 20MG/ML (2%) 15 ML SYRINGE OPTIME
INTRAMUSCULAR | Status: DC | PRN
  Administered 2019-02-25: 1.5 mg/kg/h via INTRAVENOUS

## 2019-02-25 MED ORDER — ALVIMOPAN 12 MG PO CAPS
12.0000 mg | ORAL_CAPSULE | Freq: Two times a day (BID) | ORAL | Status: DC
  Administered 2019-02-26 – 2019-02-28 (×5): 12 mg via ORAL
  Filled 2019-02-25 (×6): qty 1

## 2019-02-25 MED ORDER — ONDANSETRON HCL 4 MG/2ML IJ SOLN
INTRAMUSCULAR | Status: AC
  Filled 2019-02-25: qty 2

## 2019-02-25 MED ORDER — DEXAMETHASONE SODIUM PHOSPHATE 10 MG/ML IJ SOLN
INTRAMUSCULAR | Status: AC
  Filled 2019-02-25: qty 1

## 2019-02-25 MED ORDER — INDOCYANINE GREEN 25 MG IV SOLR
INTRAVENOUS | Status: DC | PRN
  Administered 2019-02-25: 5 mg

## 2019-02-25 MED ORDER — LACTATED RINGERS IV SOLN
INTRAVENOUS | Status: DC | PRN
  Administered 2019-02-25 (×2): via INTRAVENOUS

## 2019-02-25 MED ORDER — OXYCODONE HCL 5 MG PO TABS
5.0000 mg | ORAL_TABLET | ORAL | Status: DC | PRN
  Filled 2019-02-25: qty 1

## 2019-02-25 MED ORDER — DIPHENHYDRAMINE HCL 50 MG/ML IJ SOLN: 12.5000 mg | Freq: Four times a day (QID) | INTRAMUSCULAR | Status: DC | PRN

## 2019-02-25 MED ORDER — MIDAZOLAM HCL 5 MG/5ML IJ SOLN
INTRAMUSCULAR | Status: DC | PRN
  Administered 2019-02-25: 2 mg via INTRAVENOUS

## 2019-02-25 MED ORDER — ACETAMINOPHEN 10 MG/ML IV SOLN
1000.0000 mg | Freq: Four times a day (QID) | INTRAVENOUS | Status: AC
  Administered 2019-02-25 – 2019-02-26 (×4): 1000 mg via INTRAVENOUS
  Filled 2019-02-25 (×4): qty 100

## 2019-02-25 MED ORDER — DEXTROSE-NACL 5-0.45 % IV SOLN
INTRAVENOUS | Status: DC
  Administered 2019-02-25 – 2019-02-28 (×5): via INTRAVENOUS

## 2019-02-25 MED ORDER — HYDROMORPHONE HCL 1 MG/ML IJ SOLN: 0.5000 mg | INTRAMUSCULAR | Status: DC | PRN

## 2019-02-25 MED ORDER — PHENYLEPHRINE HCL-NACL 20-0.9 MG/250ML-% IV SOLN
INTRAVENOUS | Status: DC | PRN
  Administered 2019-02-25: 25 ug/min via INTRAVENOUS

## 2019-02-25 MED ORDER — SODIUM CHLORIDE 0.9 % IV BOLUS
1000.0000 mL | Freq: Once | INTRAVENOUS | Status: AC
  Administered 2019-02-25: 1000 mL via INTRAVENOUS

## 2019-02-25 SURGICAL SUPPLY — 115 items
APPLICATOR COTTON TIP 6 STRL (MISCELLANEOUS) ×2 IMPLANT
APPLICATOR COTTON TIP 6IN STRL (MISCELLANEOUS) ×8
APPLICATOR SURGIFLO ENDO (HEMOSTASIS) IMPLANT
BAG LAPAROSCOPIC 12 15 PORT 16 (BASKET) ×2 IMPLANT
BAG RETRIEVAL 12/15 (BASKET) ×3
BAG RETRIEVAL 12/15MM (BASKET) ×1
BAG URO CATCHER STRL LF (MISCELLANEOUS) ×2 IMPLANT
BENZOIN TINCTURE PRP APPL 2/3 (GAUZE/BANDAGES/DRESSINGS) ×2 IMPLANT
BLADE HEX COATED 2.75 (ELECTRODE) ×2 IMPLANT
BLADE SURG SZ10 CARB STEEL (BLADE) IMPLANT
CATH SILICONE 5CC 18FR (INSTRUMENTS) ×4 IMPLANT
CELLS DAT CNTRL 66122 CELL SVR (MISCELLANEOUS) ×2 IMPLANT
CHLORAPREP W/TINT 26 (MISCELLANEOUS) ×2 IMPLANT
CLIP VESOLOCK LG 6/CT PURPLE (CLIP) ×8 IMPLANT
CLIP VESOLOCK MED LG 6/CT (CLIP) ×4 IMPLANT
CLIP VESOLOCK XL 6/CT (CLIP) ×6 IMPLANT
CLOTH BEACON ORANGE TIMEOUT ST (SAFETY) ×2 IMPLANT
CONT SPEC 4OZ CLIKSEAL STRL BL (MISCELLANEOUS) ×4 IMPLANT
COVER SURGICAL LIGHT HANDLE (MISCELLANEOUS) ×4 IMPLANT
COVER TIP SHEARS 8 DVNC (MISCELLANEOUS) ×2 IMPLANT
COVER TIP SHEARS 8MM DA VINCI (MISCELLANEOUS) ×2
COVER WAND RF STERILE (DRAPES) IMPLANT
DECANTER SPIKE VIAL GLASS SM (MISCELLANEOUS) ×4 IMPLANT
DERMABOND ADVANCED (GAUZE/BANDAGES/DRESSINGS) ×2
DERMABOND ADVANCED .7 DNX12 (GAUZE/BANDAGES/DRESSINGS) ×4 IMPLANT
DRAIN CHANNEL RND F F (WOUND CARE) ×2 IMPLANT
DRAIN PENROSE 18X1/2 LTX STRL (DRAIN) IMPLANT
DRAPE ARM DVNC X/XI (DISPOSABLE) ×8 IMPLANT
DRAPE COLUMN DVNC XI (DISPOSABLE) ×2 IMPLANT
DRAPE DA VINCI XI ARM (DISPOSABLE) ×8
DRAPE DA VINCI XI COLUMN (DISPOSABLE) ×2
DRSG TEGADERM 4X4.75 (GAUZE/BANDAGES/DRESSINGS) ×4 IMPLANT
ELECT PENCIL ROCKER SW 15FT (MISCELLANEOUS) ×4 IMPLANT
ELECT REM PT RETURN 15FT ADLT (MISCELLANEOUS) ×4 IMPLANT
EVACUATOR SILICONE 100CC (DRAIN) ×2 IMPLANT
GAUZE 4X4 16PLY RFD (DISPOSABLE) IMPLANT
GLOVE BIO SURGEON STRL SZ 6.5 (GLOVE) ×6 IMPLANT
GLOVE BIO SURGEON STRL SZ8 (GLOVE) ×4 IMPLANT
GLOVE BIO SURGEONS STRL SZ 6.5 (GLOVE) ×2
GLOVE BIOGEL M STRL SZ7.5 (GLOVE) ×12 IMPLANT
GLOVE BIOGEL PI IND STRL 7.5 (GLOVE) ×2 IMPLANT
GLOVE BIOGEL PI IND STRL 8.5 (GLOVE) IMPLANT
GLOVE BIOGEL PI INDICATOR 7.5 (GLOVE) ×2
GLOVE BIOGEL PI INDICATOR 8.5 (GLOVE) ×4
GOWN STRL REUS W/TWL LRG LVL3 (GOWN DISPOSABLE) ×20 IMPLANT
GOWN STRL REUS W/TWL XL LVL3 (GOWN DISPOSABLE) ×4 IMPLANT
IRRIG SUCT STRYKERFLOW 2 WTIP (MISCELLANEOUS) ×4
IRRIGATION SUCT STRKRFLW 2 WTP (MISCELLANEOUS) ×2 IMPLANT
KIT PROCEDURE DA VINCI SI (MISCELLANEOUS) ×2
KIT PROCEDURE DVNC SI (MISCELLANEOUS) IMPLANT
KIT TURNOVER KIT A (KITS) ×2 IMPLANT
LOOP VESSEL MAXI BLUE (MISCELLANEOUS) ×4 IMPLANT
MANIFOLD NEPTUNE II (INSTRUMENTS) ×4 IMPLANT
NDL ASPIRATION 22 (NEEDLE) ×2 IMPLANT
NDL INSUFFLATION 14GA 120MM (NEEDLE) ×2 IMPLANT
NEEDLE ASPIRATION 22 (NEEDLE) ×4 IMPLANT
NEEDLE INSUFFLATION 14GA 120MM (NEEDLE) ×4 IMPLANT
PACK CYSTO (CUSTOM PROCEDURE TRAY) ×2 IMPLANT
PACK ROBOT UROLOGY CUSTOM (CUSTOM PROCEDURE TRAY) ×4 IMPLANT
PAD POSITIONING PINK XL (MISCELLANEOUS) ×4 IMPLANT
PORT ACCESS TROCAR AIRSEAL 12 (TROCAR) ×2 IMPLANT
PORT ACCESS TROCAR AIRSEAL 5M (TROCAR) ×2
RELOAD STAPLE 60 2.6 WHT THN (STAPLE) ×6 IMPLANT
RELOAD STAPLE 60 4.1 GRN THCK (STAPLE) ×6 IMPLANT
RELOAD STAPLER GREEN 60MM (STAPLE) ×14 IMPLANT
RELOAD STAPLER WHITE 60MM (STAPLE) ×10 IMPLANT
RETRACTOR LONRSTAR 16.6X16.6CM (MISCELLANEOUS) IMPLANT
RETRACTOR STAY HOOK 5MM (MISCELLANEOUS) IMPLANT
RETRACTOR STER APS 16.6X16.6CM (MISCELLANEOUS)
RETRACTOR WND ALEXIS 18 MED (MISCELLANEOUS) ×2 IMPLANT
RTRCTR WOUND ALEXIS 18CM MED (MISCELLANEOUS) ×4
SCRUB TECHNI CARE 4 OZ NO DYE (MISCELLANEOUS) ×4 IMPLANT
SEAL CANN UNIV 5-8 DVNC XI (MISCELLANEOUS) ×8 IMPLANT
SEAL XI 5MM-8MM UNIVERSAL (MISCELLANEOUS) ×8
SET TRI-LUMEN FLTR TB AIRSEAL (TUBING) ×4 IMPLANT
SOLUTION ELECTROLUBE (MISCELLANEOUS) ×4 IMPLANT
SPONGE LAP 18X18 RF (DISPOSABLE) ×6 IMPLANT
SPONGE LAP 4X18 RFD (DISPOSABLE) ×4 IMPLANT
STAPLER ECHELON LONG 60 440 (INSTRUMENTS) ×4 IMPLANT
STAPLER RELOAD GREEN 60MM (STAPLE) ×28
STAPLER RELOAD WHITE 60MM (STAPLE) ×20
STENT SET URETHERAL LEFT 7FR (STENTS) ×4 IMPLANT
STENT SET URETHERAL RIGHT 7FR (STENTS) ×4 IMPLANT
SURGIFLO W/THROMBIN 8M KIT (HEMOSTASIS) IMPLANT
SUT CHROMIC 4 0 RB 1X27 (SUTURE) ×6 IMPLANT
SUT ETHILON 3 0 PS 1 (SUTURE) ×4 IMPLANT
SUT MNCRL AB 4-0 PS2 18 (SUTURE) ×8 IMPLANT
SUT NOVA NAB GS-21 0 18 T12 DT (SUTURE) ×4 IMPLANT
SUT PDS AB 0 CTX 36 PDP370T (SUTURE) ×12 IMPLANT
SUT SILK 3 0 SH 30 (SUTURE) ×2 IMPLANT
SUT SILK 3 0 SH CR/8 (SUTURE) ×4 IMPLANT
SUT V-LOC BARB 180 2/0GR6 GS22 (SUTURE)
SUT VIC AB 2-0 CT1 27 (SUTURE) ×2
SUT VIC AB 2-0 CT1 27XBRD (SUTURE) ×2 IMPLANT
SUT VIC AB 2-0 SH 18 (SUTURE) IMPLANT
SUT VIC AB 2-0 UR5 27 (SUTURE) ×16 IMPLANT
SUT VIC AB 2-0 UR6 27 (SUTURE) ×4 IMPLANT
SUT VIC AB 3-0 SH 27 (SUTURE) ×12
SUT VIC AB 3-0 SH 27X BRD (SUTURE) ×4 IMPLANT
SUT VIC AB 3-0 SH 27XBRD (SUTURE) ×8 IMPLANT
SUT VIC AB 4-0 RB1 27 (SUTURE) ×8
SUT VIC AB 4-0 RB1 27XBRD (SUTURE) ×8 IMPLANT
SUT VLOC BARB 180 ABS3/0GR12 (SUTURE) ×4
SUTURE V-LC BRB 180 2/0GR6GS22 (SUTURE) IMPLANT
SUTURE VLOC BRB 180 ABS3/0GR12 (SUTURE) ×2 IMPLANT
SYR CONTROL 10ML LL (SYRINGE) ×2 IMPLANT
SYSTEM UROSTOMY GENTLE TOUCH (WOUND CARE) ×4 IMPLANT
TOWEL OR NON WOVEN STRL DISP B (DISPOSABLE) ×4 IMPLANT
TROCAR BLADELESS 15MM (ENDOMECHANICALS) ×4 IMPLANT
TROCAR XCEL NON-BLD 5MMX100MML (ENDOMECHANICALS) IMPLANT
TUBING CONNECTING 10 (TUBING) IMPLANT
TUBING CONNECTING 10' (TUBING)
WATER STERILE IRR 1000ML POUR (IV SOLUTION) ×6 IMPLANT
WATER STERILE IRR 3000ML UROMA (IV SOLUTION) ×2 IMPLANT
YANKAUER SUCT BULB TIP 10FT TU (MISCELLANEOUS) ×4 IMPLANT

## 2019-02-25 NOTE — Progress Notes (Signed)
Day of Surgery   Subjective/Chief Complaint:   1 - Muscle Invasive Bladder Cancer - pt has refused cystectomy x several and therefore had very atypical course.  Very Unusual Course as follows:  2018 - T2G3 by Rosana Hoes at [facility]. Given MVAC x 4  2018-2020 - Keytruda  08/2018 - induciton BCG  09/2018 - CT no mets.  11/2018 - T2G3 by TURBT, CT clinically localized   2 -Right Indirect Inguinal Hernia - indrect fat-containing hernia incidetnal on CT x several. Non-palpable.   Today " John Holt " is seen to proceed with cystoprostatectomy. He completed bowel prep to clear. C19 screen and MRSA negative. Hgb 13. Stomal marking performed.   Objective: Vital signs in last 24 hours: Temp:  [97.6 F (36.4 C)-98.1 F (36.7 C)] 98.1 F (36.7 C) (10/30 0542) Pulse Rate:  [51-64] 62 (10/30 0542) Resp:  [13-18] 18 (10/30 0542) BP: (107-138)/(66-76) 117/66 (10/30 0542) SpO2:  [99 %-100 %] 100 % (10/30 0542) Weight:  [58.8 kg-59 kg] 59 kg (10/30 0556) Last BM Date: 02/24/19  Intake/Output from previous day: 10/29 0701 - 10/30 0700 In: 3420 [P.O.:3420] Out: -  Intake/Output this shift: No intake/output data recorded.  General appearance: alert, cooperative and at baseline.  Eyes: negative Nose: Nares normal. Septum midline. Mucosa normal. No drainage or sinus tenderness. Throat: lips, mucosa, and tongue normal; teeth and gums normal Neck: supple, symmetrical, trachea midline Back: symmetric, no curvature. ROM normal. No CVA tenderness. Resp: non-labored on RA Cardio: Nl rate GI: soft, non-tender; bowel sounds normal; no masses,  no organomegaly Male genitalia: normal Extremities: extremities normal, atraumatic, no cyanosis or edema Pulses: 2+ and symmetric Lymph nodes: Cervical, supraclavicular, and axillary nodes normal. Neurologic: Grossly normal RLQ stomal marking site noted and favorable.   Lab Results:  Recent Labs    02/24/19 1618  WBC 5.1  HGB 13.4  HCT 42.0  PLT 195    BMET Recent Labs    02/24/19 1618  NA 139  K 3.7  CL 107  CO2 23  GLUCOSE 90  BUN 21  CREATININE 1.01  CALCIUM 8.9   PT/INR No results for input(s): LABPROT, INR in the last 72 hours. ABG No results for input(s): PHART, HCO3 in the last 72 hours.  Invalid input(s): PCO2, PO2  Studies/Results: No results found.  Anti-infectives: Anti-infectives (From admission, onward)   Start     Dose/Rate Route Frequency Ordered Stop   02/25/19 0600  piperacillin-tazobactam (ZOSYN) IVPB 3.375 g     3.375 g 100 mL/hr over 30 Minutes Intravenous 30 min pre-op 02/24/19 1553     02/24/19 1630  metroNIDAZOLE (FLAGYL) tablet 500 mg     500 mg Oral Every 4 hours 02/24/19 1612 02/24/19 2356   02/24/19 1615  neomycin (MYCIFRADIN) tablet 1,000 mg     1,000 mg Oral Every 4 hours 02/24/19 1612 02/24/19 2356      Assessment/Plan:  Proceed as planned with cysto/icg, robotic cystoprostatectomy with node dissection and conduit diversion. Risks, benefits, alternatives, expected peri-op course discussed previously and reiterated today.   Alexis Frock 02/25/2019

## 2019-02-25 NOTE — Anesthesia Postprocedure Evaluation (Signed)
Anesthesia Post Note  Patient: John Holt  Procedure(s) Performed: ROBOT ASSISTED LAPAROSCOPIC RADICAL CYSTOPROSTATECTOMY BILATERAL PELVIC LYMPHADENECTOMY, ILEAL CONDIUT, UMBILICAL HERNIA REPAIR (Bilateral ) CYSTOSCOPY WITH INJECTION (N/A )     Patient location during evaluation: PACU Anesthesia Type: General Level of consciousness: awake and alert Pain management: pain level controlled Vital Signs Assessment: post-procedure vital signs reviewed and stable Respiratory status: spontaneous breathing, nonlabored ventilation, respiratory function stable and patient connected to nasal cannula oxygen Cardiovascular status: blood pressure returned to baseline and stable Postop Assessment: no apparent nausea or vomiting Anesthetic complications: no    Last Vitals:  Vitals:   02/25/19 1415 02/25/19 1430  BP: 134/74 136/77  Pulse: 66 65  Resp: 10 11  Temp:  (!) 36.4 C  SpO2: 100% 100%    Last Pain:  Vitals:   02/25/19 1430  TempSrc:   PainSc: Tyler Deis

## 2019-02-25 NOTE — Transfer of Care (Signed)
Immediate Anesthesia Transfer of Care Note  Patient: JORAN ULIN  Procedure(s) Performed: ROBOT ASSISTED LAPAROSCOPIC RADICAL CYSTOPROSTATECTOMY BILATERAL PELVIC LYMPHADENECTOMY, ILEAL CONDIUT, UMBILICAL HERNIA REPAIR (Bilateral ) CYSTOSCOPY WITH INJECTION (N/A )  Patient Location: PACU  Anesthesia Type:General  Level of Consciousness: awake, alert , oriented and patient cooperative  Airway & Oxygen Therapy: Patient Spontanous Breathing and Patient connected to face mask oxygen  Post-op Assessment: Report given to RN, Post -op Vital signs reviewed and stable and Patient moving all extremities  Post vital signs: Reviewed and stable  Last Vitals:  Vitals Value Taken Time  BP 128/77 02/25/19 1335  Temp    Pulse 71 02/25/19 1338  Resp 13 02/25/19 1338  SpO2 100 % 02/25/19 1338  Vitals shown include unvalidated device data.  Last Pain:  Vitals:   02/25/19 0556  TempSrc:   PainSc: 0-No pain      Patients Stated Pain Goal: 4 ([id number])  Complications: No apparent anesthesia complications

## 2019-02-25 NOTE — Brief Op Note (Signed)
02/24/2019 - 02/25/2019  1:15 PM  PATIENT:  John Holt  73 y.o. male  PRE-OPERATIVE DIAGNOSIS:  BLADDER CANCER  POST-OPERATIVE DIAGNOSIS:  BLADDER CANCER  PROCEDURE:  Procedure(s) with comments: ROBOT ASSISTED LAPAROSCOPIC RADICAL CYSTOPROSTATECTOMY BILATERAL PELVIC LYMPHADENECTOMY, ILEAL CONDIUT, UMBILICAL HERNIA REPAIR (Bilateral) - 6 HRS CYSTOSCOPY WITH INJECTION (N/A)  SURGEON:  Surgeon(s) and Role:    * Alexis Frock, MD - Primary  PHYSICIAN ASSISTANT:   ASSISTANTS: Clemetine Marker PA   ANESTHESIA:   local and general  EBL:  450 mL   BLOOD ADMINISTERED:none  DRAINS: 1 - JP to bulb; 2 - Urostomy to gravity with Rt (red) and LT (blue) Bander stents   LOCAL MEDICATIONS USED:  MARCAINE     SPECIMEN:  Source of Specimen:  1 - ureteral margins; 2 - pelvic lymph nodes; 3 - cystoprostatectomy  DISPOSITION OF SPECIMEN:  PATHOLOGY  COUNTS:  YES  TOURNIQUET:  * No tourniquets in log *  DICTATION: .Other Dictation: Dictation Number [id number]  PLAN OF CARE: Admit to inpatient   PATIENT DISPOSITION:  PACU - hemodynamically stable.   Delay start of Pharmacological VTE agent (>24hrs) due to surgical blood loss or risk of bleeding: yes

## 2019-02-25 NOTE — Anesthesia Procedure Notes (Signed)
Procedure Name: Intubation Date/Time: 02/25/2019 7:41 AM Performed by: Mitzie Na, CRNA Pre-anesthesia Checklist: Patient identified, Emergency Drugs available, Suction available and Patient being monitored Patient Re-evaluated:Patient Re-evaluated prior to induction Oxygen Delivery Method: Circle system utilized Preoxygenation: Pre-oxygenation with 100% oxygen Induction Type: IV induction Ventilation: Mask ventilation without difficulty Laryngoscope Size: Mac and 3 Grade View: Grade I Tube type: Oral Tube size: 7.5 mm Number of attempts: 1 Airway Equipment and Method: Stylet and Oral airway Placement Confirmation: ETT inserted through vocal cords under direct vision,  positive ETCO2 and breath sounds checked- equal and bilateral Secured at: 23 cm Tube secured with: Tape Dental Injury: Teeth and Oropharynx as per pre-operative assessment

## 2019-02-26 ENCOUNTER — Encounter (HOSPITAL_COMMUNITY): Payer: Self-pay | Admitting: Urology

## 2019-02-26 LAB — BASIC METABOLIC PANEL
Anion gap: 7 (ref 5–15)
BUN: 13 mg/dL (ref 8–23)
CO2: 21 mmol/L — ABNORMAL LOW (ref 22–32)
Calcium: 8.2 mg/dL — ABNORMAL LOW (ref 8.9–10.3)
Chloride: 110 mmol/L (ref 98–111)
Creatinine, Ser: 1.08 mg/dL (ref 0.61–1.24)
GFR calc Af Amer: >60 mL/min (ref >60)
GFR calc non Af Amer: >60 mL/min (ref >60)
Glucose, Bld: 134 mg/dL — ABNORMAL HIGH (ref 70–99)
Potassium: 3.7 mmol/L (ref 3.5–5.1)
Sodium: 138 mmol/L (ref 135–145)

## 2019-02-26 LAB — GLUCOSE, CAPILLARY
Glucose-Capillary: 128 mg/dL — ABNORMAL HIGH (ref 70–99)
Glucose-Capillary: 91 mg/dL (ref 70–99)
Glucose-Capillary: 95 mg/dL (ref 70–99)
Glucose-Capillary: 101 mg/dL — ABNORMAL HIGH (ref 70–99)

## 2019-02-26 LAB — HEMOGLOBIN AND HEMATOCRIT, BLOOD
HCT: 33.3 % — ABNORMAL LOW (ref 39.0–52.0)
Hemoglobin: 10.5 g/dL — ABNORMAL LOW (ref 13.0–17.0)

## 2019-02-26 MED ORDER — HEPARIN SODIUM (PORCINE) 5000 UNIT/ML IJ SOLN
5000.0000 [IU] | Freq: Three times a day (TID) | INTRAMUSCULAR | Status: DC
  Administered 2019-02-26 – 2019-03-02 (×12): 5000 [IU] via SUBCUTANEOUS
  Filled 2019-02-26 (×12): qty 1

## 2019-02-26 NOTE — Progress Notes (Signed)
1 Day Post-Op   Subjective/Chief Complaint:  1 - Muscle Invasive Bladder Cancer - s/p robotic cystoprostatectomy with node dissection and conduit diversion 02/25/19. Path pending. Admitted pre-op for stomal marking / bowel prep / labs 10/29. Stepdown POD 0.  2 - Post-Op Ileus - s/p bowel anastomosis as part of cystectomy with urinary diversion. Received enterg peri-op. NPO with ice chips initially.  3 - Disposition / Rehab - independent and lives with wife at baseline. PT eval pending. Ostomy service helping with initial teaching.  Today " Graciano" is stable.  JP output on high side, but UOP excellent and Hgb and Cr good.    Objective: Vital signs in last 24 hours: Temp:  [97.5 F (36.4 C)-98.8 F (37.1 C)] 98.8 F (37.1 C) (10/31 0400) Pulse Rate:  [59-79] 62 (10/31 0600) Resp:  [10-16] 15 (10/31 0600) BP: (112-136)/(54-77) 131/61 (10/31 0600) SpO2:  [94 %-100 %] 100 % (10/31 0600) Last BM Date: 02/24/19  Intake/Output from previous day: 10/30 0701 - 10/31 0700 In: 4369.7 [I.V.:3834.4; IV Piggyback:535.3] Out: 2290 [Urine:1100; Drains:690; Blood:500] Intake/Output this shift: No intake/output data recorded.  General appearance: alert and cooperative Eyes: some resolving peri-orbital and eyeld subcutanoues emphyseam (post-surgical) Nose: Nares normal. Septum midline. Mucosa normal. No drainage or sinus tenderness. Throat: lips, mucosa, and tongue normal; teeth and gums normal Neck: supple, symmetrical, trachea midline Back: symmetric, no curvature. ROM normal. No CVA tenderness. Resp: Non-labored on minimal [place] O2.  Cardio: Nl rate GI: soft, non-tender; bowel sounds normal; no masses,  no organomegaly Male genitalia: Some mild penoscrotal edema that is soft, post-surgical.  Extremities: extremities normal, atraumatic, no cyanosis or edema Pulses: 2+ and symmetric Skin: Skin color, texture, turgor normal. No rashes or lesions Neurologic: Grossly normal Incision/Wound: RLQ  Ursotomy pink / patent with Rt (red) and LT (blue) bander stents and copious non-foul light pink urine and scant mucus. LLQ JP with serosanguinous fluid that is non-foul. Surgical sites c/d/i. NO c/c/e. SCD's in place.   Lab Results:  Recent Labs    02/24/19 1618 02/25/19 1344 02/26/19 0208  WBC 5.1  --   --   HGB 13.4 12.3* 10.5*  HCT 42.0 38.5* 33.3*  PLT 195  --   --    BMET Recent Labs    02/24/19 1618 02/26/19 0208  NA 139 138  K 3.7 3.7  CL 107 110  CO2 23 21*  GLUCOSE 90 134*  BUN 21 13  CREATININE 1.01 1.08  CALCIUM 8.9 8.2*   PT/INR No results for input(s): LABPROT, INR in the last 72 hours. ABG No results for input(s): PHART, HCO3 in the last 72 hours.  Invalid input(s): PCO2, PO2  Studies/Results: Dg Abd Portable 1v  Result Date: 02/25/2019 CLINICAL DATA:  Left ureteral stent EXAM: PORTABLE ABDOMEN - 1 VIEW COMPARISON:  None. FINDINGS: Portable supine intraoperative view performed. Single-view. There is partial imaging of a left ureteral stent with the proximal loop in the expected region of the left kidney by plain radiography. Additional artifact overlies the left upper quadrant. IMPRESSION: Partial imaging of a left ureteral stent with the proximal loop in the expected region of the left kidney renal pelvis by plain radiography. Electronically Signed   By: Jerilynn Mages.  Shick M.D.   On: 02/25/2019 12:40    Anti-infectives: Anti-infectives (From admission, onward)   Start     Dose/Rate Route Frequency Ordered Stop   02/25/19 0600  piperacillin-tazobactam (ZOSYN) IVPB 3.375 g     3.375 g 100 mL/hr over 30  Minutes Intravenous 30 min pre-op 02/24/19 1553 02/25/19 0752   02/24/19 1630  metroNIDAZOLE (FLAGYL) tablet 500 mg     500 mg Oral Every 4 hours 02/24/19 1612 02/24/19 2356   02/24/19 1615  neomycin (MYCIFRADIN) tablet 1,000 mg     1,000 mg Oral Every 4 hours 02/24/19 1612 02/24/19 2356      Assessment/Plan:  1 - Muscle Invasive Bladder Cancer - doing well  POD 1. Transfer to med-surg floor. No further cancer directed care this admission. Begin Prentiss Heparin proph.   2 - Post-Op Ileus - Ice chips today. Clears tomorrow as long as no emesis. Continue entereg.   3 - Disposition / Rehab - PT eval for help with return to ambulation and DME planning.   Alexis Frock 02/26/2019

## 2019-02-26 NOTE — Progress Notes (Signed)
Recd pt from ICU from [facility]. Condition stable. VSS. Pt transferred from [facility] to Chair. Instructed not to get up with out assistance. Pt verbalized understanding.Cont with plan of care

## 2019-02-27 LAB — GLUCOSE, CAPILLARY
Glucose-Capillary: 104 mg/dL — ABNORMAL HIGH (ref 70–99)
Glucose-Capillary: 107 mg/dL — ABNORMAL HIGH (ref 70–99)
Glucose-Capillary: 110 mg/dL — ABNORMAL HIGH (ref 70–99)
Glucose-Capillary: 111 mg/dL — ABNORMAL HIGH (ref 70–99)
Glucose-Capillary: 118 mg/dL — ABNORMAL HIGH (ref 70–99)
Glucose-Capillary: 98 mg/dL (ref 70–99)

## 2019-02-27 MED ORDER — ACETAMINOPHEN 325 MG PO TABS
650.0000 mg | ORAL_TABLET | ORAL | Status: DC | PRN
  Administered 2019-02-27 (×2): 650 mg via ORAL
  Filled 2019-02-27: qty 2

## 2019-02-27 NOTE — Progress Notes (Signed)
2 Days Post-Op Subjective: Patient reports improved abdominal pain. No vomiting. Urine is clear. Negative flatus.  Objective: Vital signs in last 24 hours: Temp:  [98.7 F (37.1 C)-99.2 F (37.3 C)] 99.2 F (37.3 C) (11/01 0448) Pulse Rate:  [72-88] 88 (11/01 0448) Resp:  [15-23] 18 (11/01 0448) BP: (105-141)/(65-107) 127/74 (11/01 0448) SpO2:  [96 %-100 %] 96 % (11/01 0448)  Intake/Output from previous day: 10/31 0701 - 11/01 0700 In: 735.2 [I.V.:577.3; IV Piggyback:157.9] Out: 3165 [Urine:2800; Drains:365] Intake/Output this shift: Total I/O In: -  Out: 68 [Drains:75]  Physical Exam:  General:alert, cooperative and appears stated age GI: soft, non tender, normal bowel sounds, no palpable masses, no organomegaly, no inguinal hernia. Urostomy in RLQ with bander stents in place Male genitalia: not done Extremities: extremities normal, atraumatic, no cyanosis or edema  Lab Results: Recent Labs    02/24/19 1618 02/25/19 1344 02/26/19 0208  HGB 13.4 12.3* 10.5*  HCT 42.0 38.5* 33.3*   BMET Recent Labs    02/24/19 1618 02/26/19 0208  NA 139 138  K 3.7 3.7  CL 107 110  CO2 23 21*  GLUCOSE 90 134*  BUN 21 13  CREATININE 1.01 1.08  CALCIUM 8.9 8.2*   No results for input(s): LABPT, INR in the last 72 hours. No results for input(s): LABURIN in the last 72 hours. Results for orders placed or performed during the hospital encounter of 02/24/19  SARS Coronavirus 2 by RT PCR (hospital order, performed in [facility] hospital lab) Nasopharyngeal Nasopharyngeal Swab     Status: None   Collection Time: 02/24/19  4:31 PM   Specimen: Nasopharyngeal Swab  Result Value Ref Range Status   SARS Coronavirus 2 NEGATIVE NEGATIVE Final    Comment: (NOTE) If result is NEGATIVE SARS-CoV-2 target nucleic acids are NOT DETECTED. The SARS-CoV-2 RNA is generally detectable in upper and lower  respiratory specimens during the acute phase of infection. The lowest  concentration of  SARS-CoV-2 viral copies this assay can detect is 250  copies / mL. A negative result does not preclude SARS-CoV-2 infection  and should not be used as the sole basis for treatment or other  patient management decisions.  A negative result may occur with  improper specimen collection / handling, submission of specimen other  than nasopharyngeal swab, presence of viral mutation(s) within the  areas targeted by this assay, and inadequate number of viral copies  (<250 copies / mL). A negative result must be combined with clinical  observations, patient history, and epidemiological information. If result is POSITIVE SARS-CoV-2 target nucleic acids are DETECTED. The SARS-CoV-2 RNA is generally detectable in upper and lower  respiratory specimens dur ing the acute phase of infection.  Positive  results are indicative of active infection with SARS-CoV-2.  Clinical  correlation with patient history and other diagnostic information is  necessary to determine patient infection status.  Positive results do  not rule out bacterial infection or co-infection with other viruses. If result is PRESUMPTIVE POSTIVE SARS-CoV-2 nucleic acids MAY BE PRESENT.   A presumptive positive result was obtained on the submitted specimen  and confirmed on repeat testing.  While 2019 novel coronavirus  (SARS-CoV-2) nucleic acids may be present in the submitted sample  additional confirmatory testing may be necessary for epidemiological  and / or clinical management purposes  to differentiate between  SARS-CoV-2 and other Sarbecovirus currently known to infect humans.  If clinically indicated additional testing with an alternate test  methodology [phone]) is advised. The  SARS-CoV-2 RNA is generally  detectable in upper and lower respiratory sp ecimens during the acute  phase of infection. The expected result is Negative. Fact Sheet for Patients:  StrictlyIdeas.no Fact Sheet for Healthcare  Providers: BankingDealers.co.za This test is not yet approved or cleared by the [place] FDA and has been authorized for detection and/or diagnosis of SARS-CoV-2 by FDA under an Emergency Use Authorization (EUA).  This EUA will remain in effect (meaning this test can be used) for the duration of the COVID-19 declaration under Section 564(b)(1) of the Act, 21 U.S.C. section 360bbb-3(b)(1), unless the authorization is terminated or revoked sooner. Performed at [facility], [place] [address] [place], [place] [address]   MRSA PCR Screening     Status: None   Collection Time: 02/24/19  6:43 PM   Specimen: Nasopharyngeal  Result Value Ref Range Status   MRSA by PCR NEGATIVE NEGATIVE Final    Comment:        The GeneXpert MRSA Assay (FDA approved for NASAL specimens only), is one component of a comprehensive MRSA colonization surveillance program. It is not intended to diagnose MRSA infection nor to guide or monitor treatment for MRSA infections. Performed at [facility], [place] [address] [place],  [address]     Studies/Results: Dg Abd Portable 1v  Result Date: 02/25/2019 CLINICAL DATA:  Left ureteral stent EXAM: PORTABLE ABDOMEN - 1 VIEW COMPARISON:  None. FINDINGS: Portable supine intraoperative view performed. Single-view. There is partial imaging of a left ureteral stent with the proximal loop in the expected region of the left kidney by plain radiography. Additional artifact overlies the left upper quadrant. IMPRESSION: Partial imaging of a left ureteral stent with the proximal loop in the expected region of the left kidney renal pelvis by plain radiography. Electronically Signed   By: Jerilynn Mages.  Shick M.D.   On: 02/25/2019 12:40    Assessment/Plan: POD#2 radical cystectomy  1. Ambulate in halls with assistance 2. Advance diet to clear liquids 3. Continue current pain control regiment   LOS: 3 days   Nicolette Bang 02/27/2019, 12:19 PM

## 2019-02-27 NOTE — Evaluation (Signed)
Physical Therapy Evaluation Patient Details Name: John Holt MRN: [id number] DOB: 1946-01-07 Today's Date: 02/27/2019   History of Present Illness  s/p robotic cystoprostatectomy with node dissection and conduit diversion 02/25/19, post op ileus. s/p bowel anastomosis as part of cystectomy with urinary diversion.PMH: anxiety, bipolar  Clinical Impression  Pt admitted with above diagnosis.  Pt  Very pleasant and motivated, active and independent at baseline, Pt amb 200' with LOB x1. will continue to follow in acute setting, may need HHPT vs no f/u depending on progress/acute LOS  Pt currently with functional limitations due to the deficits listed below (see PT Problem List). Pt will benefit from skilled PT to increase their independence and safety with mobility to allow discharge to the venue listed below.       Follow Up Recommendations No PT follow up;Home health PT(pending progress)    Equipment Recommendations  None recommended by PT    Recommendations for Other Services       Precautions / Restrictions Precautions Precautions: Fall Restrictions Weight Bearing Restrictions: No      Mobility  Bed Mobility               General bed mobility comments: NT--pt in bathroom on arrival  Transfers Overall transfer level: Needs assistance Equipment used: None Transfers: Sit to/from Stand Sit to Stand: Min guard         General transfer comment: cues for hand placement and use of LEs to control descent  Ambulation/Gait Ambulation/Gait assistance: Min guard Gait Distance (Feet): 200 Feet Assistive device: None;IV Pole Gait Pattern/deviations: Step-through pattern;Decreased stride length;Drifts right/left     General Gait Details: very slow guarded gait, unsteady however  improved with distance, LOB x1 with min/guard to recover, reactions delayed  Stairs            Wheelchair Mobility    Modified Rankin (Stroke Patients Only)       Balance Overall  balance assessment: Needs assistance   Sitting balance-Leahy Scale: Fair Sitting balance - Comments: wt shifting painful     Standing balance-Leahy Scale: Fair Standing balance comment: unable to tolerate challenges                             Pertinent Vitals/Pain Pain Assessment: 0-10 Pain Score: 3  Pain Location: lower abdomen Pain Descriptors / Indicators: Grimacing;Guarding Pain Intervention(s): Limited activity within patient's tolerance;Monitored during session;Repositioned    Home Living Family/patient expects to be discharged to:: Private residence Living Arrangements: Spouse/significant other Available Help at Discharge: Family;Available 24 hours/day Type of Home: House Home Access: Stairs to enter   [employer] of Steps: 3 Home Layout: One level Home Equipment: None      Prior Function Level of Independence: Independent         Comments: pt enjoys walking 1 to 2 miles a day     Hand Dominance        Extremity/Trunk Assessment   Upper Extremity Assessment Upper Extremity Assessment: Overall WFL for tasks assessed    Lower Extremity Assessment Lower Extremity Assessment: Generalized weakness       Communication   Communication: No difficulties  Cognition                                              General Comments  Exercises     Assessment/Plan    PT Assessment Patient needs continued PT services  PT Problem List Decreased activity tolerance;Decreased mobility;Pain;Decreased balance       PT Treatment Interventions DME instruction;Therapeutic activities;Gait training;Functional mobility training;Balance training;Therapeutic exercise;Patient/family education    PT Goals (Current goals can be found in the Care Plan section)  Acute Rehab PT Goals PT Goal Formulation: With patient Time For Goal Achievement: 03/06/19    Frequency Min 3X/week   Barriers to discharge         Co-evaluation               AM-PAC PT "6 Clicks" Mobility  Outcome Measure Help needed turning from your back to your side while in a flat bed without using bedrails?: A Little Help needed moving from lying on your back to sitting on the side of a flat bed without using bedrails?: A Little Help needed moving to and from a bed to a chair (including a wheelchair)?: A Little Help needed standing up from a chair using your arms (e.g., wheelchair or bedside chair)?: A Little Help needed to walk in hospital room?: A Little Help needed climbing 3-5 steps with a railing? : A Lot 6 Click Score: 17    End of Session Equipment Utilized During Treatment: Gait belt Activity Tolerance: Patient tolerated treatment well Patient left: in chair;with call bell/phone within reach   PT Visit Diagnosis: Unsteadiness on feet (R26.81);Other abnormalities of gait and mobility (R26.89)    Time: 1209-1224 PT Time Calculation (min) (ACUTE ONLY): 15 min   Charges:   PT Evaluation $PT Eval Low Complexity: 1 Low          Kenyon Ana, PT  Pager: [phone] Acute Rehab Dept [facility]): [phone]   02/27/2019   [facility] 02/27/2019, 12:51 PM

## 2019-02-28 LAB — BASIC METABOLIC PANEL
Anion gap: 9 (ref 5–15)
BUN: 10 mg/dL (ref 8–23)
CO2: 19 mmol/L — ABNORMAL LOW (ref 22–32)
Calcium: 8.5 mg/dL — ABNORMAL LOW (ref 8.9–10.3)
Chloride: 107 mmol/L (ref 98–111)
Creatinine, Ser: 1.01 mg/dL (ref 0.61–1.24)
GFR calc Af Amer: >60 mL/min (ref >60)
GFR calc non Af Amer: >60 mL/min (ref >60)
Glucose, Bld: 124 mg/dL — ABNORMAL HIGH (ref 70–99)
Potassium: 3.4 mmol/L — ABNORMAL LOW (ref 3.5–5.1)
Sodium: 135 mmol/L (ref 135–145)

## 2019-02-28 LAB — CBC
HCT: 37 % — ABNORMAL LOW (ref 39.0–52.0)
Hemoglobin: 11.8 g/dL — ABNORMAL LOW (ref 13.0–17.0)
MCH: 30.3 pg (ref 26.0–34.0)
MCHC: 31.9 g/dL (ref 30.0–36.0)
MCV: 95.1 fL (ref 80.0–100.0)
Platelets: 219 10*3/uL (ref 150–400)
RBC: 3.89 MIL/uL — ABNORMAL LOW (ref 4.22–5.81)
RDW: 15.1 % (ref 11.5–15.5)
WBC: 7.8 10*3/uL (ref 4.0–10.5)
nRBC: 0 % (ref 0.0–0.2)

## 2019-02-28 LAB — GLUCOSE, CAPILLARY
Glucose-Capillary: 100 mg/dL — ABNORMAL HIGH (ref 70–99)
Glucose-Capillary: 104 mg/dL — ABNORMAL HIGH (ref 70–99)
Glucose-Capillary: 104 mg/dL — ABNORMAL HIGH (ref 70–99)
Glucose-Capillary: 107 mg/dL — ABNORMAL HIGH (ref 70–99)
Glucose-Capillary: 108 mg/dL — ABNORMAL HIGH (ref 70–99)
Glucose-Capillary: 141 mg/dL — ABNORMAL HIGH (ref 70–99)
Glucose-Capillary: 82 mg/dL (ref 70–99)

## 2019-02-28 MED ORDER — AMOXICILLIN-POT CLAVULANATE 875-125 MG PO TABS
1.0000 | ORAL_TABLET | Freq: Two times a day (BID) | ORAL | Status: DC
  Administered 2019-02-28 – 2019-03-02 (×5): 1 via ORAL
  Filled 2019-02-28 (×5): qty 1

## 2019-02-28 NOTE — Op Note (Signed)
NAMEMASON, MCELHONE MEDICAL RECORD [id number] ACCOUNT [id number] DATE OF BIRTH:October 09, 1945 FACILITY: [facility] LOCATION: [facility] PHYSICIAN:Haizley Cannella, MD  OPERATIVE REPORT  DATE OF PROCEDURE:  02/25/2019  PREOPERATIVE DIAGNOSIS:  Muscle invasive bladder cancer.  PROCEDURE: 1.  Cystoscopy, injection of indocyanine green dye. 2.  Robotic-assisted laparoscopic cystectomy and prostatectomy. 3.  Bilateral pelvic laminectomy. 4.  Ileal conduit urinary diversion.  ESTIMATED BLOOD LOSS:  [id number] mL  COMPLICATIONS:  None.  SPECIMEN: 1.  Right distal ureteral margin frozen section negative. 2.  Left distal ureteral margin frozen section negative. 3.  Right final ureteral margin. 4.  Left final ureteral margin. 5.  Right external iliac lymph nodes. 6.  Right obturator lymph nodes. 7.  Right internal iliac lymph nodes. 8.  Right common iliac lymph nodes. 9.  Aortic bifurcation lymph nodes. 10.  Left common iliac lymph nodes. 11.  Left internal iliac lymph nodes. 12.  Left external iliac lymph nodes. 13.  Left obturator lymph nodes. 14.  Cystoprostatectomy.  ASSISTANT:  Debbrah Alar, PA  DRAINS:   1.  Jackson-Pratt drain to bulb suction. 2.  Right lower quadrant urostomy to gravity drainage with right (red) and left (blue) bander stents.  FINDINGS: 1.  Preperitoneal mesh from the umbilicus to the pubis without evidence of ongoing hernia. 2.  Loose adhesions of the cecum area. 3.  Single ureters bilaterally. 4.  No evidence of sentinel lymph nodes.  INDICATIONS:  The patient is a 73 year old [place] of [place], who has had an unusual course of muscle invasive bladder cancer for several years.  He initially elected a path in an effort to try to perform bladder preservation with aggressive up front chemo  at another facility.  He was variably compliant with surveillance.  Following this, he developed recurrent gross hematuria and was found on transurethral resection to have persistent  muscle invasive disease, but quite fortunately clinically localized.   Options were discussed for further management including curative versus noncurative options and at this point he wished to proceed with cystoprostatectomy with curative intent with conduit diversion.  Informed consent was then placed in the medical  record.  He was admitted yesterday for bowel prep, labs and stomal marking.  DESCRIPTION OF PROCEDURE:  The patient was identified.  Procedure being cystoscopy with ICG injection and cystoprostatectomy was confirmed.  Procedure timeout was performed.  Intravenous antibiotics administered.  General endotracheal anesthesia induced.   The patient was placed into a low lithotomy position and sterile field was created prepped and draped base of the penis, perineum and proximal thighs using his infraxiphoid abdomen using non-iodinated contrast after clipper shaving and after tucking  his arms at his side.  He was further fastened to the table using 3-inch tape over foam padding across the supraxiphoid chest.  A test of T-Berg positioning was performed and found to be in suitable position.  An LAVH type drape was applied.  Next, cystourethroscopy was  performed with a 24-French injection scope set in the anterior and posterior urethra revealed some modest bilobar prostatic hypertrophy.  Inspection of urinary bladder revealed minimal volume of residual intraluminal tumor but some contour abnormalities  of the trigone and left lateral wall area.  As there is no focal tumor, 2 mL of indocyanine green dye was injected across approximately 5 mucosal blebs in the trigone area for sentinel angiography and a silicone Foley catheter was placed per urethra to  straight drain.  Next, a high-flow, low-pressure pneumoperitoneum was obtained using Veress technique in the supraumbilical midline  having passed the aspiration and drop test.  An 8 mm camera port was then placed in same location.  Laparoscopic   instrument cavity revealed some loose adhesions in the right lower quadrant to the sigmoid anterior abdominal wall.  There was excellent position of preperitoneal appearing mesh from the umbilicus inferiorly towards the area of the deep pelvis and  towards the area of the inguinal rings bilaterally.  Distal ports were placed as follows:  Right paramedian 8 mm robotic port, right paramedian 15 mm assistant port site through the marked conduit site, a 12 mm right far lateral assistant port site, left  paramedian 8 mm robotic port site, left far lateral 8 mm robotic port site.  Robot was docked and passed electronic checks.  Attention was directed at development of the left retroperitoneum.  Incision was made lateral to the descending colon from the  iliac vessels superiorly for a distance of approximately 10 cm and then inferiorly coursing just lateral to the left median umbilical ligament towards the anterior abdominal wall and this created a posterior peritoneal flap which was used for medial  retraction.  The ureter was encountered as it coursed over the iliac vessels and marked vessel loop dissected proximally for a distance of approximately 6 cm to the canal crossing and distally to the ureterovesical junction, which was doubly clipped and  ligated.  Frozen section negative for carcinoma.  Proximal clip being a tagged dyed suture and the left ureter was tucked out of the true pelvis.  Attention was directed at the left-sided pelvic lymphadenectomy.  Sentinel angiography with ICG dye  revealed excellent bladder parenchymal uptake, but a paucity of lymphatic channels.  Sentinel nodes within the pelvis.  This was felt to likely represent on his prior aggressive chemo regimen.  As such, standard template lymphadenectomy was performed on  the left side.  First, the left external iliac group with the boundaries being left external iliac artery, vein, pelvic sidewall, iliac bifurcation.  Lymphostasis typical  clips.  Next left obturator group was dissected free with the boundaries being left  external neck, vein, pelvic sidewall, obturator nerve.  Lymphostasis achieved with cold clips left obturator nerve was inspected following maneuvers and found to be uninjured.  Next left internal group was dissected free with the boundaries being  appropriate tissue overlying the internal iliac artery from the area of the iliac bifurcation to the superior vesicle artery, and then the left common iliac group with boundaries being aortic bifurcation and iliac bifurcation.  Attention was directed at  right-sided dissection.  Some loose attachments were taken down of some adhesions mostly of the epiploic fat of the cecal area and appendix area.  There is no evidence of acute appendicitis.  The ileocecal junction was also visualized and the ileum was  traced proximally a distance of approximately 15 cm and marked to the level of the mesenteric fat with a distal single clip and then just proximal to this a silk tag suture for demarcation of the later distal conduit site and the retroperitoneal incision  was carried superiorly and lateral to the ascending colon and then inferiorly just lateral to right medial umbilical ligament and then performing a Y-shaped dog leg to this along the right iliac vessels towards the area of the aortic bifurcation.  The  posterior peritoneal flap was used to sweep the colon medially.  The right ureter was identified as it coursed over the iliac vessels marked a vessel loop, dissected proximally a distance of approximately  6 cm distal ureterovesical junction, which was  doubly clipped and ligated with the proximal tie clip being a white tagged suture.  Frozen section negative for carcinoma.  The right ureter was tugged out of the true pelvis.  Next, lymphadenectomy was performed in the right side using the exact same  boundaries as per the left.  Sentinel lymphangiography on the right side also  revealed a paucity of lymphatic channels or lymph nodes.  In addition, aortic bifurcation tissue was removed and set aside, labeled as such.  Next, the left ureter was tunneled  the retroperitoneal window just anterior to the aortic bifurcation to the right side of the right ureter, left ureter, distal ileum tags sutures were placed into a single surgical clip and tucked well out of the true pelvis.  Lateral dissection was then  performed lateral to the bladder and sweeping the endopelvic fascia away from the lateral side of the prostate and base of orientation.  Posterior dissection was performed by placing an inverted U incision from the previous lateral peritoneal incisions  and developing a flap just posterior to the plane of the prostate behind the vas and seminal vesicles were the apex of the prostate.  This exposed the vascular pedicles on each side, which were controlled using 2 fires of the white load stapler each  side, taking exquisite care to avoid injury to the obturator nerve or viscera, which did not occur.  The space of Retzius was then developed between the medial umbilical ligaments.  This exposed the dorsal venous complex was controlled using a green load  stapler, taking care to avoid membranous urethral injury which did not occur.  Final able dissection was performed anterior plane transecting the membranous urethra coldly placing 2 extra-large clips on the situ Foley catheter allowing it to be used as  a bucket handle for specimen manipulation in the bladder and prostate specimen en bloc was placed into an extra-large EndoCatch bag for later retrieval.  Next, digital rectal exam was performed using indicator glove under laparoscopic vision.  No  evidence of rectal violation was noted.  At this point, hemostasis appeared excellent.  Sponge, needle counts were correct.  We achieved the goals of the extirpative portion of the procedure today.  A closed suction drain was brought the previous  left  lateral most port site near the peritoneal cavity.  The specimen string tag was brought to the left paramedian robotic port site and the right ureter, left ureter, distal ileum tags sutures were grasped with a self-locking grasper via the 15 mm port  site.  Robot was then undocked.  The patient was leveled.  Extraction incision was made by carrying the previous camera port site inferiorly erring to the left side of the umbilicus for a distance approximately 5 cm, removing the cystoprostatectomy  specimen setting aside from pathology.  Drain stitch was applied.  Wound protector was then applied through this, and the right and left ureters were brought through this as was the terminal ileum area.  Attention was directed at harvesting the conduit  bowel.  A segment of ileum approximately 11 cm in length was taken into continuity using a green load stapler proximally and distally, taking exquisite care to avoid devascularization of the conduit or anastomotic segments.  The mesentery was further  developed using 2 fires of the white load stapler distally, one fire proximally again taking exquisite care to avoid devascularization of the conduit or anastomotic segments which did not occur.  The conduit  was then oriented to retroperitoneal  orientation and bowel-to-bowel anastomosis was performed using 2 fires of the green load stapler in the mesenteric for a total anastomotic distance of approximately 80 mm.  Acute angle was bolstered using interrupted silk.  The free end was oversewn  using running silk, followed by a second imbricating layer of running silk.  The mesenteric defect was reapproximated using interrupted silk.  The bowel-bowel anastomosis was visibly viable palpably patent and redelivered into the abdominal cavity.  The  proximal suture line on the conduit segment was excluded using running Vicryl.  The distal staple line was removed.  Attention was directed to the ureteroileal anastomosis.   First with the right ureter at the area of the proximal conduit erring towards  the mesenteric side.  A 4 mm segment of bowel serosa mucosa was excised and 4 mucosal everting sutures were applied to prevent back walling and the right ureter was spatulated for a distance of 8 mm and a final right ureteral margin set aside from  pathology.  Heel stitch was applied of 4-0 Vicryl and a red colored bander stent to 26 cm anastomosis and ureteroenteric anastomosis was performed using 2 separate running suture lines of 4-0 Vicryl, which revealed an excellent mucosal apposition without  undue tension.  Next, ureteroenteric anastomosis was performed as per the right technique on the left side but using a blue colored bander stent 26 cm anastomosis.  There was a paucity of urine output via the left stent.  There was some on the right,  but still not vigorous.  Fluid bolus and albumin was given but to help confirm stent placement, an on-table KUB was performed of the left side, which revealed that the left stent appeared to be curled in the expected location of the kidney.  Having  confirmed this, the left ureter enteric anastomosis was completed and during this, there was vigorous output of urine around the distal portion of the stent.  Next, a quarter-sized diameter column of skin and fibroid tissue was excised at the previously  placed conduit site and 15 mm port site fascia was dilated to accommodate 2 surgeon's fingers and 2-0 Vicryl suture was applied in 4 quadrants and the conduit brought through this and this was tacked x4 to prevent parastomal hernia formation.  Omentum  was brought over the extraction site and was then reapproximated to the level of the fascia using interrupted Novafil.  Reapproximation of Scarpa's with a running Vicryl.  All incision sites were infiltrated with dilute likewise Marcaine and closed with  skin using subcuticular Monocryl and Dermabond.  Final conduit maturation was then  performed first using a rose budding technique.  Four quadrants followed by interrupted Vicryl 3-0 x 3 between each quadrant suture.  It was felt an excellent rose budding  of the stoma, which is pink and effluxing very light urine.  Ostomy appliance was placed and procedure terminated.  The patient tolerated the procedure well.  No immediate complications.  The patient did postanesthesia care in stable condition with plan  for step down admission.  TN/NUANCE  D:02/25/2019 T:02/26/2019 JOB:[id number]

## 2019-02-28 NOTE — Consult Note (Signed)
[place] Nurse ostomy consult note Stoma type/location: RUQ ileal conduit with 2 stents intact, Red = Right, Blue = Left Stomal assessment/size: 1 and 3/8 inches round, edematous, os at center Peristomal assessment: intact with erythema noted around stab incisions Treatment options for stomal/peristomal skin: skin barrier ring, convex pouch Output: golden urine, clear Ostomy pouching: 1pc.convex pouch with skin barrier ring Education provided:  Explained role of ostomy nurse and creation of stoma  Explained stoma characteristics (budded, flush, color, texture, care) Demonstrated pouch change (cutting new barrier, measuring stoma, cleaning peristomal skin and stoma, use of barrier ring) Education on emptying when 1/3 to 1/2 full and how to empty Demonstrated hooking pouch to nighttime drainage bag  Answered patient questions.  Next scheduled visit with wife will be at 9:30am on Wednesday, 11/2.    Supplies ordered to bedside. Enrolled patient in [place] program: Yes  [place] nursing team will follow, and will remain available to this patient, the nursing and medical teams.   Thanks, Maudie Flakes, MSN, RN, [place], Arther Abbott  Pager# [phone]

## 2019-02-28 NOTE — Care Management Important Message (Signed)
Important Message  Patient Details IM Letter given to Dessa Phi RN to present to the Patient Name: John Holt MRN: [id number] Date of Birth: 09-06-1945   Medicare Important Message Given:  Yes     Kerin Salen 02/28/2019, 12:02 PM

## 2019-02-28 NOTE — Progress Notes (Signed)
Urology Progress Note   3 Days Post-Op from Radical cystectomy with ileal conduit urinary diversion.   Subjective: NAEON.  Passing flatus.  No longer having abdominal pain.  Feeling much better this morning. Looking forward to ostomy teaching.   Objective: Vital signs in last 24 hours: Temp:  [99.4 F (37.4 C)] 99.4 F (37.4 C) (11/02 0427) Pulse Rate:  [84-87] 87 (11/02 0427) Resp:  [16-18] 18 (11/02 0427) BP: (132-136)/(73-82) 132/82 (11/02 0427) SpO2:  [98 %-100 %] 98 % (11/02 0427)  Intake/Output from previous day: 11/01 0701 - 11/02 0700 In: 2557.1 [P.O.:360; I.V.:2197.1] Out: 1750 [Urine:1600; Drains:150] Intake/Output this shift: No intake/output data recorded.  Physical Exam:  General: Alert and oriented CV: Regular rate Lungs: No increased work of breathing Abdomen: Soft, appropriately tender. Incisions c/d/i. JP SS GU: RLQ ileal conduit with two bander stents in place draining clear yellow urine. Stoma viable.   Ext: NT, No erythema  Lab Results: Recent Labs    02/25/19 1344 02/26/19 0208  HGB 12.3* 10.5*  HCT 38.5* 33.3*   Recent Labs    02/26/19 0208  NA 138  K 3.7  CL 110  CO2 21*  GLUCOSE 134*  BUN 13  CREATININE 1.08  CALCIUM 8.2*    Studies/Results: No results found.  Assessment/Plan:  73 y.o. male s/p RC/IC.  Overall doing well post-op.   - Advance to general diet - OOB, walk in halls x2-3 - Ostomy teaching - ARoBF, continue Entereg at this time.   Dispo: Anticipate HH needs. ARoBF.   LOS: 4 days

## 2019-02-28 NOTE — TOC Initial Note (Signed)
Transition of Care [facility]) - Initial/Assessment Note    Patient Details  Name: John Holt MRN: [id number] Date of Birth: [birth year]  [facility] CM/SW Contact:    John Phi, RN Phone Number: 02/28/2019, 4:04 PM  Clinical Narrative: Patient already has referral for [facility] from [facility] rep John Holt following-await HHRN orders,face to face.noted may need HHPT depending on progress.                  Expected Discharge Plan: [place] Barriers to Discharge: Continued Medical Work up   Patient Goals and CMS Choice Patient states their goals for this hospitalization and ongoing recovery are:: go home CMS Medicare.gov Compare Post Acute Care list provided to:: Patient    Expected Discharge Plan and Services Expected Discharge Plan: [place]   Discharge Planning Services: CM Consult Post Acute Care Choice: [place] arrangements for the past 2 months: [place] Agency: [facility] Date [place]: 02/28/19 Time HH Agency Contacted: 1603 Representative spoke with at [place] at [place]: [place] Arrangements/Services Living arrangements for the past 2 months: [place] with:: Spouse Patient language and need for interpreter reviewed:: Yes Do you feel safe going back to the place where you live?: Yes      Need for Family Participation in Patient Care: No (Comment) Care giver support system in place?: Yes (comment) Current home services: Home RN([facility] has received community referral for [facility] from pcp PTA.) Criminal Activity/Legal Involvement Pertinent to Current Situation/Hospitalization: No - Comment as needed  Activities of Daily Living Home Assistive Devices/Equipment: Eyeglasses ADL Screening (condition at time of admission) Patient's cognitive ability adequate to safely complete daily activities?: Yes Is the patient deaf or  have difficulty hearing?: No Does the patient have difficulty seeing, even when wearing glasses/contacts?: No Does the patient have difficulty concentrating, remembering, or making decisions?: No Patient able to express need for assistance with ADLs?: Yes Does the patient have difficulty dressing or bathing?: No Independently performs ADLs?: Yes (appropriate for developmental age) Does the patient have difficulty walking or climbing stairs?: No Weakness of Legs: None Weakness of Arms/Hands: None  Permission Sought/Granted Permission sought to share information with : Case Manager Permission granted to share information with : Yes, Verbal Permission Granted  Share Information with NAMEJuliann Holt [facility] [id number]           Emotional Assessment Appearance:: Appears stated age Attitude/Demeanor/Rapport: Gracious Affect (typically observed): Accepting Orientation: : Oriented to Self, Oriented to Place, Oriented to  Time Alcohol / Substance Use: Not Applicable Psych Involvement: No (comment)  Admission diagnosis:  BLADDER CANCER Patient Active Problem List   Diagnosis Date Noted  . Status post ileal conduit ([place]) 02/25/2019  . Bladder cancer ([place]) 02/25/2019  . Sinus bradycardia 10/26/2018  . Malignant neoplasm of posterior wall of urinary bladder ([place]) 02/24/2017  . Benign prostatic hyperplasia with urinary obstruction 08/18/2016  . ED (erectile dysfunction) of organic origin 08/18/2016  . Focal epilepsy with impairment of consciousness, intractable ([place]) 04/30/2014  . Memory loss 04/30/2014  . Localization-related symptomatic epilepsy and epileptic syndromes with complex partial seizures, intractable, without status epilepticus ([place]) 08/11/2012   PCP:  John Solian, MD Pharmacy:   [facility] [address] [place] [address]  AT [place] [address] [place] [address] Phone: [phone] Fax:  [phone]     Social Determinants of Health (SDOH) Interventions    Readmission Risk Interventions No flowsheet data found.

## 2019-03-01 LAB — GLUCOSE, CAPILLARY
Glucose-Capillary: 89 mg/dL (ref 70–99)
Glucose-Capillary: 85 mg/dL (ref 70–99)
Glucose-Capillary: 83 mg/dL (ref 70–99)
Glucose-Capillary: 72 mg/dL (ref 70–99)
Glucose-Capillary: 158 mg/dL — ABNORMAL HIGH (ref 70–99)

## 2019-03-01 NOTE — Progress Notes (Signed)
Urology Progress Note   4 Days Post-Op from Radical cystectomy with ileal conduit urinary diversion.   Subjective: NAEON.  Passing flatus, BM yesterday. Walking well.  Enjoyed ostomy teaching. Will have another session tomorrow.  Taking PO well without N/V.   Objective: Vital signs in last 24 hours: Temp:  [97.8 F (36.6 C)-99.2 F (37.3 C)] 97.8 F (36.6 C) (11/03 1312) Pulse Rate:  [80-89] 80 (11/03 1312) Resp:  [18-20] 20 (11/03 1312) BP: (118-121)/(78-81) 121/78 (11/03 1312) SpO2:  [97 %-100 %] 100 % (11/03 1312)  Intake/Output from previous day: 11/02 0701 - 11/03 0700 In: 948.8 [P.O.:600; I.V.:348.8] Out: 605 [Urine:550; Drains:55] Intake/Output this shift: Total I/O In: 240 [P.O.:240] Out: -   Physical Exam:  General: Alert and oriented CV: Regular rate Lungs: No increased work of breathing Abdomen: Soft, flat, appropriately tender. Incisions c/d/i. JP SS GU: RLQ ileal conduit with two bander stents in place draining clear yellow urine. Stoma viable.   Ext: NT, No erythema  Lab Results: Recent Labs    02/28/19 0754  HGB 11.8*  HCT 37.0*   Recent Labs    02/28/19 0754  NA 135  K 3.4*  CL 107  CO2 19*  GLUCOSE 124*  BUN 10  CREATININE 1.01  CALCIUM 8.5*    Assessment/Plan:  73 y.o. male s/p RC/IC.    Overall doing verywell post-op.  Walking well, cleared by PT,  did well with ostomy teaching, taking PO well without nausea, abdominal exam without concern for distention or unanticipated discomfort, stoma healthy.   - Continue general diet; medlock  - OOB, walk in halls x2-3 - Ostomy teaching tomorrow with wife.  - Home health referral made, anticipate discharge tomorrow.    LOS: 5 days

## 2019-03-02 ENCOUNTER — Other Ambulatory Visit: Payer: Self-pay | Admitting: Urology

## 2019-03-02 LAB — GLUCOSE, CAPILLARY
Glucose-Capillary: 70 mg/dL (ref 70–99)
Glucose-Capillary: 78 mg/dL (ref 70–99)
Glucose-Capillary: 83 mg/dL (ref 70–99)
Glucose-Capillary: 85 mg/dL (ref 70–99)

## 2019-03-02 MED ORDER — OXYCODONE-ACETAMINOPHEN 5-325 MG PO TABS: 1.0000 | ORAL_TABLET | ORAL | 0 refills | Status: DC | PRN

## 2019-03-02 MED ORDER — AMOXICILLIN-POT CLAVULANATE 875-125 MG PO TABS: 1.0000 | ORAL_TABLET | Freq: Two times a day (BID) | ORAL | 0 refills | Status: DC

## 2019-03-02 MED ORDER — AMOXICILLIN-POT CLAVULANATE 875-125 MG PO TABS: 1.0000 | ORAL_TABLET | Freq: Two times a day (BID) | ORAL | 0 refills | Status: AC

## 2019-03-02 NOTE — Discharge Summary (Signed)
[facility] Discharge Summary  Admit date: 02/24/2019  Discharge date and time: 03/02/19   Discharge to: Home  Discharge Service: Urology  Discharge Attending Physician:  Dr. Phebe Colla  Discharge  Diagnoses: Bladder Cancer  Secondary Diagnosis: Active Problems:   Status post ileal conduit [facility])   Bladder cancer ([place])   Anxiety  OR Procedures: Procedure(s): ROBOT ASSISTED LAPAROSCOPIC RADICAL CYSTOPROSTATECTOMY BILATERAL PELVIC LYMPHADENECTOMY, ILEAL CONDIUT, CYSTOSCOPY WITH INJECTION OF ICG 02/25/2019   Ancillary Procedures: None   Discharge Day Services: The patient was seen and examined by the Urology team both in the morning and immediately prior to discharge.  Vital signs and laboratory values were stable and within normal limits.  The physical exam was benign and unchanged and all surgical wounds were examined.  Discharge instructions were explained and all questions answered.  Subjective  No acute events overnight. Pain Controlled. No fever or chills.  Objective Patient Vitals for the past 8 hrs:  BP Temp Temp src Pulse Resp SpO2  03/02/19 0600 126/85 97.7 F (36.5 C) Oral 86 18 100 %   No intake/output data recorded.  General Appearance:        No acute distress Lungs:                       Normal work of breathing on room air Heart:                                Regular rate and rhythm Abdomen:                         Soft, non-tender, non-distended. Small scaly rash near midline incision. RLQ ileal conduit with healthy pink stoma and 2 Bander stents in position. Clear yellow urine.  Extremities:                      Warm and well perfused   Hospital Course:  The patient underwent robotic assisted radical cystoprostatectomy with ileal conduit urinary diversion and cystoscopy with ICG injection on 02/25/2019.  The patient tolerated the procedure well, was extubated in the OR, and afterwards was taken to the PACU for routine post-surgical care. When stable  the patient was transferred to stepdown.   The patient did well postoperatively and was transferred to the floor on POD1.  The patients diet was slowly advanced and at the time of discharge was tolerating a regular diet. He did well with ostomy teaching. JP drain output decreased steadily and was removed prior to discharge. He did have mild erythema/rash near his midline extraction incision; Augmentin was started on POD3 and continued at discharge. The patient was discharged home 5 Days Post-Op, at which point was tolerating a regular solid diet, manage his urostomy, have adequate pain control with P.O. pain medication, and could ambulate without difficulty. The patient will follow up with [place] for post op check and ureteral stent removal.   Pathology was not available for review at time of discharge.  Condition at Discharge: Improved

## 2019-03-02 NOTE — Progress Notes (Signed)
Augmentin sent to pharmacy.

## 2019-03-02 NOTE — Consult Note (Signed)
[facility] Nurse ostomy follow up Stoma type/location: RLQ urostomy  (I incorrectly noted it to be a RUQ on 11/2) Stomal assessment/size: 1 and 3/8 inches round, edematous with stents (2) intact Peristomal assessment: intact.  Stab wounds with erythema. Mild bruising around stoma Treatment options for stomal/peristomal skin: skin barrier ring Output: gold colored clear urine Ostomy pouching: 1pc convex urostomy pouch with skin barrier ring.   Education provided: Patient and wife participated in extended education session  Explained stoma characteristics (budded, flush, color, texture, care) Demonstrated pouch change (cutting new barrier, measuring stoma, cleaning peristomal skin and stoma, use of barrier ring) Education on use wick in stoma to keep skin dry with pouch change Education on emptying when 1/3 to 1/2 full and how to empty Education on urine characteristics (sediment, mucous) Demonstrated hooking pouch to nighttime drainage bag and received return demo x2 Discussed bathing dehydration   Supplies provided for discharge:  5 pouches, 5 rings, 3 adapters  Answered patient/family questions to their expressed satisfaction.  Contact info provided for post acute questions.  Enrolled patient in [place] Start Discharge program: Yes. 4 pouches, 4 rings, 1 adapter, ostomy powder, 1 belt requested  [facility] nursing team will follow, but will remain available to this patient, the nursing and medical teams.  Please re-consult if needed. Thanks, Maudie Flakes, MSN, RN, [place], Arther Abbott  Pager# [phone]

## 2019-03-03 LAB — SURGICAL PATHOLOGY

## 2019-06-03 ENCOUNTER — Ambulatory Visit: Payer: Medicare PPO | Admitting: Neurology

## 2019-06-03 ENCOUNTER — Encounter: Payer: Self-pay | Admitting: Neurology

## 2019-06-03 ENCOUNTER — Other Ambulatory Visit: Payer: Self-pay

## 2019-06-03 VITALS — BP 86/58 | HR 77 | Ht 68.0 in | Wt 121.2 lb

## 2019-06-03 DIAGNOSIS — G40219 Localization-related (focal) (partial) symptomatic epilepsy and epileptic syndromes with complex partial seizures, intractable, without status epilepticus: Secondary | ICD-10-CM

## 2019-06-03 MED ORDER — ZONISAMIDE 100 MG PO CAPS: ORAL_CAPSULE | ORAL | 3 refills | Status: DC

## 2019-06-03 MED ORDER — CLOBAZAM 2.5 MG/ML PO SUSP: ORAL | 5 refills | Status: DC

## 2019-06-03 NOTE — Progress Notes (Signed)
NEUROLOGY FOLLOW UP OFFICE NOTE  AVARD JANSSEN KO:[id number] 1945-05-20  HISTORY OF PRESENT ILLNESS: I had the pleasure of seeing John Holt in follow-up in the neurology clinic on 06/03/2019. He was last seen 3 months ago for intractable epilepsy. His wife is present to provide additional history. Since his last visit, he underwent laparoscopic radical cystoprostatectomy last October 2020. His wife reports he had one seizure while he was in the hospital, he was apparently confused trying to get out of bed. She reports a seizure 2 weeks ago, he was sitting at his desk reading and she found him on the floor. He is on Zonisamide 300mg  qhs. He had reduced clobazam taking 5mg  skipping a dose every 3 nights for 3 weeks, but since the seizure he is back to taking it every night. They have not been sleeping in the same bed since the surgery, she is not sure if he has had any nocturnal seizures. She does feel that the seizures have been less intense and shorter in duration since VNS was placed. He is tolerating VNS settings and reports he feels just fine. His wife reports times where his speech is very slow and he stops in the middle of a sentence then finishes it seconds later, he does not appear aware of them. It is almost like he goes deaf and cannot hear during these periods. She has also noticed a subdued quality, "everything is slowed down." This would be markedly noticeable for 7-10 days, then he comes out of it and is completely fluent again. His mood would be down as well. Last occurrence was a week ago.   History on Initial Assessment 05/13/2017: This is a very pleasant 74 yo RH man with a history of focal epilepsy of right mesial temporal or right mesial frontal onset. He had been seeing epileptologist Dr. Jacelyn Grip for several years, records were reviewed. He reports staring episodes in childhood. In his 35s, he recalls having episodes of a sinking feeling in his stomach with deja vu occurring around once a  month initially. When he was started on seizure medications, these episodes occur rarely, around 1-2 a year. His wife started noticing possible symptoms in his 76s, they were in a meeting and she noticed him swaying back and forth but seemed unaware of it. He started having nocturnal spells more than 15 years ago, where he would have "leg thrusting" (bicycling type leg movements), lip smacking, often followed by arousal with disorientation, need to urinate and walking around (at times resembling sleepwalking). He has had episodes where he would have a cluster of seizures then become paranoid and manic with significant post-ictal psychosis. He usually has nocturnal seizures every 3 weeks or so. His wife would give him prn clonazepam when he has clusters, and this has helped "zap" them. He has not needed clonazepam in at least 1.5 years. He has rare daytime seizures, his wife recalls the seizures in 2006 and 2013 with post-ictal psychosis, there is note of a spell of undressing and confusion in the [employer] at [place] in 2015. In the past he has been on Lamictal, Depakote, Gabapentin. Gabapentin has been tapered off. He has been taking Onfi for several years and feels this has been the most helpful. He was recently diagnosed with bladder cancer in October 2018 and underwent resection and chemotherapy. They report that towards the end of chemo, he had some incidents with seizures. He has no recollection of the events. His wife reports that after he had  his second infusion and the PICC line was taken out, he started having slight feet movements and was staring and unresponsive. He had another episode while shoveling snow, he came in and started talking and his speech became garbled, "words were not right" for 60 seconds. Three days after on the way home from chemo, he had a similar episode with his speech. He was talking about Aristotle then his speech became garbled. The last daytime event was on 04/28/17. He started  swaying side to side, speech was garbled, and he was staring off for a few minutes. Due to these daytime episodes, his Onfi dose was temporarily increased to 40mg  qhs. They report he has not had any nocturnal seizures since November, which is unusual for him. The higher dose of Onfi knocks him out. They report he is done with chemotherapy and will be seeing his oncologist to discuss next steps.  He denies any headaches, dizziness, diplopia, dysarthria/dysphagia, neck/back pain, focal numbness/tingling/weakness, bowel/bladder dysfunction, no falls. He had muscle cramps, taking magnesium supplements has helped. His memory is what bothers him a lot. They mostly notice problems with his "narrative memory." He would not recall watching a movie 2-3 weeks prior. He cannot recall anecdotes. He would make notes on an article, and later find that he had already made notes on the same article previously. He is able to remember abstract concepts from dense philosophy books he reads. His spatial memory is not good. He denies missing medications. Every once in a while he forgets his medications (twice in the past month). He drives only to the grocery and denies getting lost. He has had Neuropsychological testing, report unavailable for review.  Update 12/23/2017: On his last visit, he reported feeling bad, more depressed about his memory, and felt Onfi was contributing a lot to his symptoms. He had a seizure on 09/01/17 during wakefulness. He had a repeat MRI brain on 10/14/17 which showed asymmetric right hippocampal volume loss suggestive of mesial temporal sclerosis. He was started on Zonisamide and did very well with no seizures for 9 weeks (baseline nocturnal seizures every 2 weeks or so). He reduced Onfi to 20mg  and his wife reported he was doing great, his sense of humor was back, he was laughing, at one point his wife thought the euphoria was almost too much. She noticed this as a big difference from how he was the past  couple of years where he had a "flattening" of mood. Unfortunately, after he reduced the Onfi to 10mg  on 12/02/17, his wife noticed almost immediately that his mood was a little darker and he was more irritable. He had a nocturnal seizure on 8/9, then another on 8/14. With each seizure, his mood was going "way, way down." He was instructed in increase Zonisamide to 400mg  qhs. He then had a seizure during the afternoon of 12/18/17. He is tearful in the office today and has some difficulty finding his words, reporting it was the end of a stressful week, he had been having feelings of anxiety that week. He had no warning and is amnestic of the seizure, his wife reports he clasped his hands together in front of him and was doing repetitive digging movements, unresponsive. He then smiled at her and grabbed her arms, jerking her around like he was dancing with her. This lasted 2-3 minutes, then he started answering questions. No focal weakness, tongue bite, or incontinence. He called our office and was instructed to increase Onfi back to 20mg  daily and Zonisamide down  to 300mg  qhs. He has felt normal this week. He has had tingling in his feet with the Zonisamide, this has quieted down but not completely gone away. He denies any headaches, dizziness, vision changes, focal weakness, no falls.   Prior AEDs: Depakote, Lamictal, Tegretol, Trileptal, Keppra, Vimpat, Gabapentin  Epilepsy Risk Factors: His father had rare spells 1-2 times a year of "zonking out." He and his brother had "spasms" as babies, none after age 35/3. He recalls having a head injury in his teenage years. Otherwise he had a normal birth and early development, no history of febrile convulsions, CNS infections, or neurosurgical procedures.   Prior workup: MRI brain 01/2014: subtle findings consistent with right mesial temporal sclerosis EEGs: EEG 2009 - right temporal spike activity and right TIRDA EEG 01/2009 - normal EEG 07/2012 - normal  MRI  2006 - normal  PET Brain 2016 - right mesial temporal hypometabolism.  EMU 04/25/2014 - 05/04/2014 6 events captured on camera, 1 off camera - repeated neck extension and flexion, followed by pelvic thrusting and body rocking movements. Appear to localize to the right temporal region, although clinically suggestive of frontal onset. Patient taken off Depakote and Lamictal and switched to high dose gabapentin  Neuropsychological testing in June 2019 indicated mild neurocognitive disorder due to seizure disorder. Testing and daily functioning not consistent with dementia. Testing revealed mostly normal cognitive functioning. However, he did demonstrate mildly reduced visual-spatial construction and more impaired visual memory, consistent with right hemisphere involvement and in particular mesial temporal lobe involvement. He also demonstrated impairment in verbal memory of non-contextual information, but memory of contextual information was intact, suggesting more of a problem with frontal-subcortical networks than hippocampal consolidation dysfunction.   PAST MEDICAL HISTORY: Past Medical History:  Diagnosis Date  . Anxiety disorder   . Bipolar 1 disorder, mixed ([place])    w/ hx psychosis/ dissociation reactions/ suicide ideattions and attempt  . Bladder cancer [facility]) urologist-- dr Alyson Ingles ([facility]) and dr Lawerance Bach [facility]rology):  oncologist-  dr Alen Blew ([facility]) & dr Marcello [facility])   dx 2018--- s/p  TURBT's ;  hx BCG tx's.  chemotherapy completed 2019, and immunotherapy Keytruda , last one 10-21-2018  . BPH (benign prostatic hypertrophy) with urinary obstruction   . History of adenomatous polyp of colon   . History of closed head injury    2000-- fell off ladder--  temporary memory loss resolved  . History of psychosis    x2  last one documented 2006  w/ hallucination  and suicide ideation  . Nocturia   . Port-A-Cath in place 2018  . S/P placement of VNS (vagus nerve stimulation)  device 01/28/2018   per pt swipe magnet twice daily  . Sigmoid diverticulosis   . Sinus bradycardia seen on cardiac monitor    cardiology --  dr g. taylor;   event monitor results 11-15-2018 in epic,  NSR w/ ST and SB and a nocturnal pause   . Temporal lobe epilepsy syndrome [facility]) neurologist-  dr Raliegh Ip. Delice Lesch-  avergae one every 2 weeks "legs jerking around, per wife , pt unaware he's doing this"   localization-related right temporal lobe --  mostly nocturnal w/ bilateral lower extremitity movement, staring and moaning then dissorietation afterwards (12-02-2018 per pt last daytime seizure 11-11-2018 and last nighttime seizure 11-29-2018  . Vesicoureteral reflux, bilateral   . Wears glasses     MEDICATIONS: Current Outpatient Medications on File Prior to Visit  Medication Sig Dispense Refill  . cloBAZam (ONFI) 10  MG tablet Take 1/2 tablet every night (Patient taking differently: Take 5 mg by mouth See admin instructions. Take 5 mg at night, may increase to 10 mg if having more than 2 seizures within 24 hours) 135 tablet 3  . clonazePAM (KLONOPIN) 0.5 MG tablet Take 1 tablet (0.5 mg total) by mouth daily as needed (epilepsy). 30 tablet 3  . levothyroxine (SYNTHROID) 125 MCG tablet Take 125 mcg by mouth daily before breakfast.    . loperamide (IMODIUM A-D) 2 MG tablet Take 2 mg by mouth 4 (four) times daily as needed for diarrhea or loose stools.    [name] oxyCODONE-acetaminophen (PERCOCET) 5-325 MG tablet Take 1 tablet by mouth every 4 (four) hours as needed for moderate pain or severe pain. Post-operatively. 15 tablet 0  . Probiotic CAPS Take 1 capsule by mouth daily.    . tadalafil (ADCIRCA/CIALIS) 20 MG tablet Take 20 mg by mouth daily as needed for erectile dysfunction.     [name] zonisamide (ZONEGRAN) 100 MG capsule TAKE 3 CAPSULES EACH NIGHT (Patient taking differently: Take 300 mg by mouth at bedtime. ) 270 capsule 3   No current facility-administered medications on file prior to visit.     ALLERGIES: Allergies  Allergen Reactions  . Chlorhexidine Rash    Full body  . Povidone-Iodine Rash    FAMILY HISTORY: Family History  Problem Relation Age of Onset  . Seizures Father   . Heart disease Father   . Cancer Father        prostat  . Cancer Mother        stomach  . Stomach cancer Mother   . Colon cancer Neg Hx   . Colon polyps Neg Hx   . Esophageal cancer Neg Hx   . Rectal cancer Neg Hx     SOCIAL HISTORY: Social History   Socioeconomic History  . Marital status: Married    Spouse name: [name]  . Number of children: 2  . Years of education: PhD  . 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 Smoking status: Former Smoker    Packs/day: 1.00    Years: 15.00    Pack years: 15.00    Types: Cigarettes    Quit date: 04/03/1972    Years since quitting: 47.1  . Smokeless tobacco: Never Used  Substance and Sexual Activity  . Alcohol use: Yes    Alcohol/week: 14.0 standard drinks    Types: 14 Glasses of wine per week    Comment: 1 or 2  wine daily; no consumption in last 24 hours  . Drug use: No  . Sexual activity: Yes    Partners: Female  Other Topics Concern  . Not on file  Social History Narrative   Pt lives at home with his wife. One story home.      Left handed      Phd in English      Caffeine Use- 2 cups daily   Social Determinants of Health   Financial Resource Strain:   . Difficulty of Paying Living Expenses: Not on file  Food Insecurity:   . Worried About Charity fundraiser in the Last Year: Not on file  . Ran Out of Food in the Last Year: Not on file  Transportation Needs:   . Lack of Transportation (Medical): Not on file  . Lack of Transportation (Non-Medical): Not on file  Physical  Activity:   . Days of Exercise per Week: Not on file  . Minutes of Exercise per Session: Not on file  Stress:   . Feeling of Stress : Not on  file  Social Connections:   . Frequency of Communication with Friends and Family: Not on file  . Frequency of Social Gatherings with Friends and Family: Not on file  . Attends Religious Services: Not on file  . Active Member of Clubs or Organizations: Not on file  . Attends Archivist Meetings: Not on file  . Marital Status: Not on file  Intimate Partner Violence:   . Fear of Current or Ex-Partner: Not on file  . Emotionally Abused: Not on file  . Physically Abused: Not on file  . Sexually Abused: Not on file    REVIEW OF SYSTEMS: Constitutional: No fevers, chills, or sweats, no generalized fatigue, change in appetite Eyes: No visual changes, double vision, eye pain Ear, nose and throat: No hearing loss, ear pain, nasal congestion, sore throat Cardiovascular: No chest pain, palpitations Respiratory:  No shortness of breath at rest or with exertion, wheezes GastrointestinaI: No nausea, vomiting, diarrhea, abdominal pain, fecal incontinence Genitourinary:  No dysuria, urinary retention or frequency Musculoskeletal:  No neck pain, back pain Integumentary: No rash, pruritus, skin lesions Neurological: as above Psychiatric: No depression, insomnia, anxiety Endocrine: No palpitations, fatigue, diaphoresis, mood swings, change in appetite, change in weight, increased thirst Hematologic/Lymphatic:  No anemia, purpura, petechiae. Allergic/Immunologic: no itchy/runny eyes, nasal congestion, recent allergic reactions, rashes  PHYSICAL EXAM: Vitals:   06/03/19 1441  Pulse: 77  SpO2: 98%   General: No acute distress Head:  Normocephalic/atraumatic Skin/Extremities: No rash, no edema Neurological Exam: alert and oriented to person, place, and time. No aphasia or dysarthria. Fund of knowledge is appropriate.  Recent and remote memory are intact. Cranial nerves: Pupils equal, round, reactive to light. Extraocular movements intact with no nystagmus. No facial asymmetry.Motor: moves  all extremities symmetrically. Gait narrow-based and steady.  VNS Therapy Management: Parameters Output Current (mA): 1.75 Signal Frequency (Hz): 20 Pulse Width (usec): 250 Signal ON Time (sec): 30 Signal OFF Time (min): 3 Magnet Output Current (mA): 2 Magnet ON Time (sec): 60 Magnet Pulse Width (usec): 250 AutoStim Output Current (mA): 1.875 AutoStim Pulse Width (usec): 250 AutoStim ON Time (sec): 30 Tachycardia Detection : On Heartbeat Detection Sensitivity: 1 Perform Verify Heartbeat Detection: yes Threshold for AutoStim (%): 20 Diagnostics Current Delievered (mA): 1.75 Lead Impedance: OK Impedence Value (Ohms): 2688 Battery Status Indicator (color): Green(75-100%)  IMPRESSION: This is a very pleasant 74 yo RH man with a history of focal epilepsy of right mesial temporal or right mesial frontal onset. Repeat MRI brain showed right hippocampal asymmetry suggestive of mesial temporal sclerosis. PET scan showed right mesial temporal hypometabolism. EEGs in the past showed right temporal spikes and TIRDA, EMU admission captured seizures that appeared to localize to the right temporal area although clinically suggestive of frontal onset. Last daytime seizure was 2 weeks ago, he had cut down on clobazam skipping a dose every 3 nights. He remains interested in completely weaning off clobazam, we will continue to do this very slowly. He will reduce to 2.5mg  qhs (liquid formulation). Continue Zonisamide 300mg  qhs. VNS interrogated today with 1 parameter changed, off time reduced to 3 minutes. Battery not at end of life. He is not driving. He will follow-up in 3 months and knows to call for any changes.   Thank you for allowing me to participate in  his care.  Please do not hesitate to call for any questions or concerns.   Ellouise Newer, M.D.   CC: Dr. Dagmar Hait

## 2019-06-03 NOTE — Patient Instructions (Signed)
1. Switch to liquid clobazam 2.5mg /mL: take 1 mL every night. Shake the bottle very well before using.  2. Continue Zonisamide 300mg  every night  3. Try using the magnet during the times of increased sluggishness  4. Follow-up in 3 months, call for any changes  Seizure Precautions: 1. If medication has been prescribed for you to prevent seizures, take it exactly as directed.  Do not stop taking the medicine without talking to your doctor first, even if you have not had a seizure in a long time.   2. Avoid activities in which a seizure would cause danger to yourself or to others.  Don't operate dangerous machinery, swim alone, or climb in high or dangerous places, such as on ladders, roofs, or girders.  Do not drive unless your doctor says you may.  3. If you have any warning that you may have a seizure, lay down in a safe place where you can't hurt yourself.    4.  No driving for 6 months from last seizure, as per [facility].   Please refer to the following link on the [place] website for more information: [email or web address]   5.  Maintain good sleep hygiene. Avoid alcohol.  6.  Contact your doctor if you have any problems that may be related to the medicine you are taking.  7.  Call 911 and bring the patient back to the ED if:        A.  The seizure lasts longer than 5 minutes.       B.  The patient doesn't awaken shortly after the seizure  C.  The patient has new problems such as difficulty seeing, speaking or moving  D.  The patient was injured during the seizure  E.  The patient has a temperature over 102 F (39C)  F.  The patient vomited and now is having trouble breathing

## 2019-06-20 ENCOUNTER — Ambulatory Visit: Payer: Medicare Other

## 2019-07-25 ENCOUNTER — Telehealth: Payer: Self-pay | Admitting: Neurology

## 2019-07-25 MED ORDER — CLOBAZAM 2.5 MG/ML PO SUSP: ORAL | 5 refills | Status: DC

## 2019-07-25 NOTE — Telephone Encounter (Signed)
Patient wife called and left message on VM that patient had a small seizure last night and then had one this morning. She states that the one this morning is one like she has never seen in was in great pain and does not remember anything. Please call he is not acting right she states

## 2019-07-25 NOTE — Addendum Note (Signed)
Addended by: Cameron Sprang on: 07/25/2019 01:13 PM   Modules accepted: Orders

## 2019-07-25 NOTE — Telephone Encounter (Signed)
Spoke to patient and wife. Patient sounds good now, alert, awake. He was mistakenly taking 2.38mL (6.25mg ) instead of 1 mL (2.5mg ) and suddenly cut down to 67mL 6 days ago. Since then, has had significant muscle pains. This morning, on the floor screaming in pain. He went to surgeon and told there was nothing to do, pain may be due to surgery, but see doctor about this. Saw PCP Dr. Dagmar Hait who did an xray, arthritis. Tylenol helps but does not eliminate back pain, still walks around with back pain. Gets severe in the morning. He does not remember this morning being on floor this morning screaming in pain. Wife reports last night he had a very small seizure, bowl was falling off lap, not responding, went to sleep and felt better. This morning he was on phone with various pharmacists, then she heard screaming, writhing back and forth, and not responding. Still in pain now, offered Ortho referral which he declines for now. May take 1/2 tab of methocarbamol if needed. Continue on clobazam 2.5mg  daily for now, we may need to increase back to 5mg  if needed. [name] planning a trip to [place] next week, will keep [place] updated.

## 2019-07-25 NOTE — Telephone Encounter (Signed)
Spoke to pt wife, pt had been taken the wrong dose of Clobazam for about 10 days was taken 14ml instead of the 2.79ml when they realized it they decreased it to the 2.5 ml.  With the decrease to the 2.5 pt has had extreme back pain to the pain he had an x-ray to make sure there wasn't anything wrong. Did find arthritis,  Pt had a seizure last night and pos one this morning he was found on the floor screaming in  Pain, episode lasted about 4 mins and happened about 15 mins ago, pt does not remember any of this happening,

## 2019-07-28 ENCOUNTER — Telehealth: Payer: Self-pay | Admitting: Neurology

## 2019-07-28 ENCOUNTER — Other Ambulatory Visit: Payer: Self-pay

## 2019-07-28 ENCOUNTER — Ambulatory Visit (HOSPITAL_COMMUNITY)
Admission: RE | Admit: 2019-07-28 | Discharge: 2019-07-28 | Disposition: A | Discharge status: Home / Self Care | Payer: Medicare Other | Source: Ambulatory Visit | Attending: Nurse Practitioner | Admitting: Nurse Practitioner

## 2019-07-28 ENCOUNTER — Other Ambulatory Visit (HOSPITAL_COMMUNITY): Payer: Self-pay | Admitting: Nurse Practitioner

## 2019-07-28 DIAGNOSIS — Z79890 Hormone replacement therapy (postmenopausal): Secondary | ICD-10-CM | POA: Diagnosis not present

## 2019-07-28 DIAGNOSIS — Z20822 Contact with and (suspected) exposure to covid-19: Secondary | ICD-10-CM | POA: Diagnosis not present

## 2019-07-28 DIAGNOSIS — R569 Unspecified convulsions: Secondary | ICD-10-CM | POA: Diagnosis present

## 2019-07-28 DIAGNOSIS — L89311 Pressure ulcer of right buttock, stage 1: Secondary | ICD-10-CM | POA: Diagnosis not present

## 2019-07-28 DIAGNOSIS — C678 Malignant neoplasm of overlapping sites of bladder: Secondary | ICD-10-CM

## 2019-07-28 DIAGNOSIS — S0101XA Laceration without foreign body of scalp, initial encounter: Secondary | ICD-10-CM | POA: Diagnosis not present

## 2019-07-28 DIAGNOSIS — F419 Anxiety disorder, unspecified: Secondary | ICD-10-CM | POA: Diagnosis not present

## 2019-07-28 DIAGNOSIS — Z9079 Acquired absence of other genital organ(s): Secondary | ICD-10-CM | POA: Diagnosis not present

## 2019-07-28 DIAGNOSIS — R64 Cachexia: Secondary | ICD-10-CM | POA: Diagnosis not present

## 2019-07-28 DIAGNOSIS — Z79899 Other long term (current) drug therapy: Secondary | ICD-10-CM | POA: Diagnosis not present

## 2019-07-28 DIAGNOSIS — N400 Enlarged prostate without lower urinary tract symptoms: Secondary | ICD-10-CM | POA: Diagnosis not present

## 2019-07-28 DIAGNOSIS — C787 Secondary malignant neoplasm of liver and intrahepatic bile duct: Secondary | ICD-10-CM | POA: Diagnosis not present

## 2019-07-28 DIAGNOSIS — Z87891 Personal history of nicotine dependence: Secondary | ICD-10-CM | POA: Diagnosis not present

## 2019-07-28 DIAGNOSIS — Z8546 Personal history of malignant neoplasm of prostate: Secondary | ICD-10-CM | POA: Diagnosis not present

## 2019-07-28 DIAGNOSIS — Z883 Allergy status to other anti-infective agents status: Secondary | ICD-10-CM | POA: Diagnosis not present

## 2019-07-28 DIAGNOSIS — G893 Neoplasm related pain (acute) (chronic): Secondary | ICD-10-CM | POA: Diagnosis not present

## 2019-07-28 DIAGNOSIS — Z86010 Personal history of colonic polyps: Secondary | ICD-10-CM | POA: Diagnosis not present

## 2019-07-28 DIAGNOSIS — Z681 Body mass index (BMI) 19 or less, adult: Secondary | ICD-10-CM | POA: Diagnosis not present

## 2019-07-28 DIAGNOSIS — L89321 Pressure ulcer of left buttock, stage 1: Secondary | ICD-10-CM | POA: Diagnosis not present

## 2019-07-28 DIAGNOSIS — C679 Malignant neoplasm of bladder, unspecified: Secondary | ICD-10-CM | POA: Diagnosis not present

## 2019-07-28 DIAGNOSIS — W1839XA Other fall on same level, initial encounter: Secondary | ICD-10-CM | POA: Diagnosis not present

## 2019-07-28 DIAGNOSIS — M545 Low back pain: Secondary | ICD-10-CM | POA: Diagnosis not present

## 2019-07-28 DIAGNOSIS — Z8551 Personal history of malignant neoplasm of bladder: Secondary | ICD-10-CM | POA: Diagnosis not present

## 2019-07-28 DIAGNOSIS — F3189 Other bipolar disorder: Secondary | ICD-10-CM | POA: Diagnosis not present

## 2019-07-28 DIAGNOSIS — G9341 Metabolic encephalopathy: Secondary | ICD-10-CM | POA: Diagnosis not present

## 2019-07-28 DIAGNOSIS — Z906 Acquired absence of other parts of urinary tract: Secondary | ICD-10-CM | POA: Diagnosis not present

## 2019-07-28 DIAGNOSIS — C7951 Secondary malignant neoplasm of bone: Secondary | ICD-10-CM | POA: Diagnosis not present

## 2019-07-28 DIAGNOSIS — G40119 Localization-related (focal) (partial) symptomatic epilepsy and epileptic syndromes with simple partial seizures, intractable, without status epilepticus: Secondary | ICD-10-CM | POA: Diagnosis not present

## 2019-07-28 DIAGNOSIS — R197 Diarrhea, unspecified: Secondary | ICD-10-CM | POA: Diagnosis not present

## 2019-07-28 DIAGNOSIS — Z7952 Long term (current) use of systemic steroids: Secondary | ICD-10-CM | POA: Diagnosis not present

## 2019-07-28 NOTE — Telephone Encounter (Signed)
Spoke to wife, went to sleep after clonazepam 0.5mg , couple of hrs later he woke up hot, given another 0.5mg . This morning woke up he is furious, saying last night he said he was dying and she was not believing him. He is still not his baseline. Wife did not give his Onfi last night, instructed to give Onfi 5mg  now, then if no improvement in a few hours, would go to ER. There is likely underlying systemic process pushing him over, he has been having a lot of pain, initially diarrhea now constipated for 3 days.

## 2019-07-28 NOTE — Telephone Encounter (Signed)
Patient's wife called to report her husband woke up furious and angry this morning. She said she is not sure what to do.  Please return call.

## 2019-07-28 NOTE — Telephone Encounter (Signed)
The following message was left with [employer] on 07/27/19 at 8:04 PM.  Caller states her husband is having post epileptic seizure psychosis. Most recent seizure was yesterday. Wants to know what they should be doing.  Caller states her husband had 3 seizures in 24 hours and then had another seizure last night. He has had cluster seizures in the past. Now having some manic episode and then wanted to be taken to the hospital. Wife is wanting to know what to do.  Eloisa Northern, RN paged On-call and advised for patient to be seen by HCP or have triage within 4 hours after the office opens.   MD returned page and asked to be connected with patient.

## 2019-07-29 ENCOUNTER — Emergency Department (HOSPITAL_COMMUNITY)
Admission: EM | Admit: 2019-07-29 | Discharge: 2019-07-29 | Disposition: A | Discharge status: Home / Self Care | Payer: Medicare Other | Source: Home / Self Care | Attending: Emergency Medicine | Admitting: Emergency Medicine

## 2019-07-29 ENCOUNTER — Other Ambulatory Visit: Payer: Self-pay

## 2019-07-29 ENCOUNTER — Encounter (HOSPITAL_COMMUNITY): Payer: Self-pay

## 2019-07-29 DIAGNOSIS — M545 Low back pain: Secondary | ICD-10-CM | POA: Insufficient documentation

## 2019-07-29 DIAGNOSIS — C7949 Secondary malignant neoplasm of other parts of nervous system: Secondary | ICD-10-CM | POA: Insufficient documentation

## 2019-07-29 DIAGNOSIS — C7951 Secondary malignant neoplasm of bone: Secondary | ICD-10-CM

## 2019-07-29 DIAGNOSIS — C61 Malignant neoplasm of prostate: Secondary | ICD-10-CM | POA: Insufficient documentation

## 2019-07-29 DIAGNOSIS — R569 Unspecified convulsions: Secondary | ICD-10-CM | POA: Diagnosis not present

## 2019-07-29 DIAGNOSIS — M549 Dorsalgia, unspecified: Secondary | ICD-10-CM

## 2019-07-29 DIAGNOSIS — C679 Malignant neoplasm of bladder, unspecified: Secondary | ICD-10-CM | POA: Insufficient documentation

## 2019-07-29 DIAGNOSIS — Z87891 Personal history of nicotine dependence: Secondary | ICD-10-CM | POA: Insufficient documentation

## 2019-07-29 DIAGNOSIS — R1032 Left lower quadrant pain: Secondary | ICD-10-CM | POA: Insufficient documentation

## 2019-07-29 DIAGNOSIS — M5489 Other dorsalgia: Secondary | ICD-10-CM | POA: Diagnosis not present

## 2019-07-29 LAB — COMPREHENSIVE METABOLIC PANEL
ALT: 24 U/L (ref 0–44)
AST: 25 U/L (ref 15–41)
Albumin: 3.7 g/dL (ref 3.5–5.0)
Alkaline Phosphatase: 176 U/L — ABNORMAL HIGH (ref 38–126)
Anion gap: 10 (ref 5–15)
BUN: 22 mg/dL (ref 8–23)
CO2: 26 mmol/L (ref 22–32)
Calcium: 9.4 mg/dL (ref 8.9–10.3)
Chloride: 104 mmol/L (ref 98–111)
Creatinine, Ser: 0.98 mg/dL (ref 0.61–1.24)
GFR calc Af Amer: >60 mL/min (ref >60)
GFR calc non Af Amer: >60 mL/min (ref >60)
Glucose, Bld: 91 mg/dL (ref 70–99)
Potassium: 4.2 mmol/L (ref 3.5–5.1)
Sodium: 140 mmol/L (ref 135–145)
Total Bilirubin: 0.9 mg/dL (ref 0.3–1.2)
Total Protein: 6.5 g/dL (ref 6.5–8.1)

## 2019-07-29 LAB — URINALYSIS, ROUTINE W REFLEX MICROSCOPIC
Bilirubin Urine: NEGATIVE
Glucose, UA: NEGATIVE mg/dL
Hgb urine dipstick: NEGATIVE
Ketones, ur: NEGATIVE mg/dL
Nitrite: NEGATIVE
Protein, ur: NEGATIVE mg/dL
Specific Gravity, Urine: 1.005 (ref 1.005–1.030)
pH: 7 (ref 5.0–8.0)

## 2019-07-29 LAB — CBC WITH DIFFERENTIAL/PLATELET
Abs Immature Granulocytes: 0.02 10*3/uL (ref 0.00–0.07)
Basophils Absolute: 0 10*3/uL (ref 0.0–0.1)
Basophils Relative: 0 %
Eosinophils Absolute: 0.9 10*3/uL — ABNORMAL HIGH (ref 0.0–0.5)
Eosinophils Relative: 11 %
HCT: 36 % — ABNORMAL LOW (ref 39.0–52.0)
Hemoglobin: 10.9 g/dL — ABNORMAL LOW (ref 13.0–17.0)
Immature Granulocytes: 0 %
Lymphocytes Relative: 7 %
Lymphs Abs: 0.6 10*3/uL — ABNORMAL LOW (ref 0.7–4.0)
MCH: 25.5 pg — ABNORMAL LOW (ref 26.0–34.0)
MCHC: 30.3 g/dL (ref 30.0–36.0)
MCV: 84.3 fL (ref 80.0–100.0)
Monocytes Absolute: 0.9 10*3/uL (ref 0.1–1.0)
Monocytes Relative: 11 %
Neutro Abs: 5.9 10*3/uL (ref 1.7–7.7)
Neutrophils Relative %: 71 %
Platelets: 345 10*3/uL (ref 150–400)
RBC: 4.27 MIL/uL (ref 4.22–5.81)
RDW: 17.4 % — ABNORMAL HIGH (ref 11.5–15.5)
WBC: 8.3 10*3/uL (ref 4.0–10.5)
nRBC: 0 % (ref 0.0–0.2)

## 2019-07-29 MED ORDER — DEXAMETHASONE 4 MG PO TABS: 4.0000 mg | ORAL_TABLET | Freq: Two times a day (BID) | ORAL | 0 refills | Status: DC

## 2019-07-29 MED ORDER — FOSFOMYCIN TROMETHAMINE 3 G PO PACK
3.0000 g | PACK | Freq: Once | ORAL | Status: AC
  Administered 2019-07-29: 3 g via ORAL
  Filled 2019-07-29: qty 3

## 2019-07-29 MED ORDER — ONDANSETRON HCL 4 MG/2ML IJ SOLN
4.0000 mg | Freq: Once | INTRAMUSCULAR | Status: AC
  Administered 2019-07-29: 4 mg via INTRAVENOUS
  Filled 2019-07-29: qty 2

## 2019-07-29 MED ORDER — POLYETHYLENE GLYCOL 3350 17 G PO PACK: 17.0000 g | PACK | Freq: Every day | ORAL | 0 refills | Status: AC

## 2019-07-29 MED ORDER — ONDANSETRON 4 MG PO TBDP: ORAL_TABLET | ORAL | 0 refills | Status: DC

## 2019-07-29 MED ORDER — MORPHINE SULFATE 15 MG PO TABS: 15.0000 mg | ORAL_TABLET | ORAL | 0 refills | Status: DC | PRN

## 2019-07-29 MED ORDER — DEXAMETHASONE SODIUM PHOSPHATE 4 MG/ML IJ SOLN
4.0000 mg | Freq: Once | INTRAMUSCULAR | Status: AC
  Administered 2019-07-29: 4 mg via INTRAVENOUS
  Filled 2019-07-29: qty 1

## 2019-07-29 MED ORDER — KETOROLAC TROMETHAMINE 30 MG/ML IJ SOLN: 30.0000 mg | Freq: Once | INTRAMUSCULAR | Status: DC

## 2019-07-29 MED ORDER — MORPHINE SULFATE (PF) 4 MG/ML IV SOLN
4.0000 mg | Freq: Once | INTRAVENOUS | Status: AC
  Administered 2019-07-29: 4 mg via INTRAVENOUS
  Filled 2019-07-29: qty 1

## 2019-07-29 MED ORDER — MORPHINE SULFATE 15 MG PO TABS
15.0000 mg | ORAL_TABLET | Freq: Once | ORAL | Status: AC
  Administered 2019-07-29: 15 mg via ORAL
  Filled 2019-07-29: qty 1

## 2019-07-29 NOTE — Discharge Instructions (Addendum)
Your scans done yesterday at [facility] urology show spread of the cancer to your spine and liver.  The spread to your spine is likely what is causing your severe back pain.   To help treat pain take 1000 mg of Tylenol 3 times daily, you can also use ibuprofen or Aleve.  Take 15 mg of morphine every 4 hours as needed for severe pain.  Morphine sometimes causes nausea, you may use Zofran as needed for nausea.  You can also apply over-the-counter salon pas Lidoderm patches on your back to help with pain.  When taking morphine you will need to use MiraLAX daily to help prevent constipation, dissolve the packet of this and any clear liquid and drink daily.  Your urinalysis showed some bacteria and white blood cells, this may be chronic and related to urostomy which were given 1 dose of antibiotics today and urine was sent for culture if it grows out something that requires further treatment this can be discussed with your urologist.  Please call to schedule follow-up appointments with Dr. Alen Blew and Dr. Tresa Moore.  Return to the emergency department if pain is not well managed at home, or you develop any other new or concerning symptoms.

## 2019-07-29 NOTE — ED Provider Notes (Signed)
[facility] DEPT Provider Note   CSN: [id number] Arrival date & time: 07/29/19  1531     History Chief Complaint  Patient presents with  . Back Pain  . Flank Pain    John Holt is a 74 y.o. male.  John Holt is a 74 y.o. male with a history of bladder and prostate cancer s/p radical cystectomy and prostatectomy in October, epilepsy, who presents today for evaluation of severe left-sided back and flank pain.  Patient states pain has been present over the last few weeks but has been significantly worsening recently.  He was initially seen by urology, they referred him back to his PCP who did an x-ray and thought this may be related to arthritis, they been treating pain with Tylenol without relief, wife reports she gave some ibuprofen with minimal relief, but this week he has had several episodes where he has been writhing in pain and none of these medications seem to improve the discomfort.  He reports if he remains still pain is minimal but with any movement pain become severe over the left flank.  He reports occasionally the pain will extend into the abdomen.  No vomiting or changes in bowels.  No fevers or chills.  No change in output from urostomy bag.  Patient denies numbness weakness or tingling in his extremities just significant difficulty moving due to pain.  Patient was seen in the urology office yesterday for 83-month scans post surgery but has not yet received the results of the scans.  Patient's wife also reports that this week he has had 4 seizures which is more than typical for him.  She does state that she has been in close contact with patient's neurologist Dr. Delice Lesch, and they realized that the patient was taking a higher dose than he was supposed to of clobazam.  After consulting the pharmacist patient abruptly reduce this dose and that is when his seizures increased, he is now back on the appropriate dose of Onfi and this is felt to most likely  be the reason patient has had increased seizures.  He denies any head injury, headaches, vision changes.  Patient is followed with Dr. Tresa Moore with urology, and by Dr. Alen Blew with oncology.        Past Medical History:  Diagnosis Date  . Anxiety disorder   . Bipolar 1 disorder, mixed ([place])    w/ hx psychosis/ dissociation reactions/ suicide ideattions and attempt  . Bladder cancer [facility]) urologist-- dr Alyson Ingles ([facility]) and dr Lawerance Bach [facility] urology):  oncologist-  dr Alen Blew ([facility]) & dr Marcello Moores [facility])   dx 2018--- s/p  TURBT's ;  hx BCG tx's.  chemotherapy completed 2019, and immunotherapy Keytruda , last one 10-21-2018  . BPH (benign prostatic hypertrophy) with urinary obstruction   . History of adenomatous polyp of colon   . History of closed head injury    2000-- fell off ladder--  temporary memory loss resolved  . History of psychosis    x2  last one documented 2006  w/ hallucination  and suicide ideation  . Nocturia   . Port-A-Cath in place 2018  . S/P placement of VNS (vagus nerve stimulation) device 01/28/2018   per pt swipe magnet twice daily  . Sigmoid diverticulosis   . Sinus bradycardia seen on cardiac monitor    cardiology --  dr g. taylor;   event monitor results 11-15-2018 in epic,  NSR w/ ST and SB and a nocturnal pause   .  Temporal lobe epilepsy syndrome [facility]) neurologist-  dr Raliegh Ip. Delice Lesch-  avergae one every 2 weeks "legs jerking around, per wife , pt unaware he's doing this"   localization-related right temporal lobe --  mostly nocturnal w/ bilateral lower extremitity movement, staring and moaning then dissorietation afterwards (12-02-2018 per pt last daytime seizure 11-11-2018 and last nighttime seizure 11-29-2018  . Vesicoureteral reflux, bilateral   . Wears glasses     Patient Active Problem List   Diagnosis Date Noted  . Status post ileal conduit ([place]) 02/25/2019  . Bladder cancer ([place]) 02/25/2019  . Sinus bradycardia 10/26/2018  . Malignant neoplasm of  posterior wall of urinary bladder ([place]) 02/24/2017  . Benign prostatic hyperplasia with urinary obstruction 08/18/2016  . ED (erectile dysfunction) of organic origin 08/18/2016  . Focal epilepsy with impairment of consciousness, intractable ([place]) 04/30/2014  . Memory loss 04/30/2014  . Localization-related symptomatic epilepsy and epileptic syndromes with complex partial seizures, intractable, without status epilepticus ([place]) 08/11/2012    Past Surgical History:  Procedure Laterality Date  . COLONOSCOPY    . COLONOSCOPY W/ POLYPECTOMY  09-11-2009  . CYSTOSCOPY W/ RETROGRADES Bilateral 08/13/2018   Procedure: CYSTOSCOPY WITH RETROGRADE PYELOGRAM;  Surgeon: Cleon Gustin, MD;  Location: [facility];  Service: Urology;  Laterality: Bilateral;  . CYSTOSCOPY WITH INJECTION N/A 02/25/2019   Procedure: CYSTOSCOPY WITH INJECTION;  Surgeon: Alexis Frock, MD;  Location: [facility];  Service: Urology;  Laterality: N/A;  . GREEN LIGHT LASER TURP (TRANSURETHRAL RESECTION OF PROSTATE N/A 04/07/2014   Procedure: GREEN LIGHT LASER TURP (TRANSURETHRAL RESECTION OF PROSTATE;  Surgeon: Ailene Rud, MD;  Location[facility];  Service: Urology;  Laterality: N/A;  . INGUINAL HERNIA REPAIR Bilateral 04/15/2013   Procedure: LAPAROSCOPIC BILATERAL INGUINAL HERNIA REPAIR;  Surgeon: Gayland Curry, MD;  Location: [facility];  Service: General;  Laterality: Bilateral;  . INSERTION OF MESH N/A 04/15/2013   Procedure: INSERTION OF MESH;  Surgeon: Gayland Curry, MD;  Location: [facility];  Service: General;  Laterality: N/A;  . PORTACATH PLACEMENT  2018  . TRANSURETHRAL RESECTION OF BLADDER  01-27-2017;  05-26-2017;  05-25-2018  both by dr Chriss Czar davis @[email or web address]   . TRANSURETHRAL RESECTION OF BLADDER TUMOR N/A 08/13/2018   Procedure: TRANSURETHRAL RESECTION OF BLADDER TUMOR (TURBT);  Surgeon: Cleon Gustin, MD;  Location: [facility];  Service: Urology;  Laterality: N/A;  1 HR  . TRANSURETHRAL RESECTION OF  BLADDER TUMOR N/A 12/03/2018   Procedure: TRANSURETHRAL RESECTION OF BLADDER TUMOR (TURBT);  Surgeon: Cleon Gustin, MD;  Location: [facility];  Service: Urology;  Laterality: N/A;  . UMBILICAL HERNIA REPAIR N/A 04/15/2013   Procedure: OPEN HERNIA REPAIR UMBILICAL ADULT;  Surgeon: Gayland Curry, MD;  Location: [facility];  Service: General;  Laterality: N/A;  . VAGUS NERVE STIMULATOR INSERTION Left 01/28/2018   Procedure: VAGAL NERVE STIMULATOR PLACEMENT;  Surgeon: Consuella Lose, MD;  Location: [facility];  Service: Neurosurgery;  Laterality: Left;  VAGAL NERVE STIMULATOR PLACEMENT       Family History  Problem Relation Age of Onset  . Seizures Father   . Heart disease Father   . Cancer Father        prostat  . Cancer Mother        stomach  . Stomach cancer Mother   . Colon cancer Neg Hx   . Colon polyps Neg Hx   . Esophageal cancer Neg Hx   . Rectal cancer Neg Hx     Social  History   Tobacco Use  . Smoking status: Former Smoker    Packs/day: 1.00    Years: 15.00    Pack years: 15.00    Types: Cigarettes    Quit date: 04/03/1972    Years since quitting: 47.3  . Smokeless tobacco: Never Used  Substance Use Topics  . Alcohol use: Yes    Alcohol/week: 14.0 standard drinks    Types: 14 Glasses of wine per week    Comment: 1 or 2  wine daily; no consumption in last 24 hours  . Drug use: No    Home Medications Prior to Admission medications   Medication Sig Start Date End Date Taking? Authorizing Provider  clonazePAM (KLONOPIN) 0.5 MG tablet Take 0.5 mg by mouth 2 (two) times daily as needed (For seizures per wife).    Yes [provider]  levothyroxine (SYNTHROID) 125 MCG tablet Take 125 mcg by mouth daily before breakfast.   Yes [provider]  loperamide (IMODIUM A-D) 2 MG tablet Take 2 mg by mouth 4 (four) times daily as needed for diarrhea or loose stools.   Yes [provider]  Multiple Vitamin (MULTIVITAMIN WITH MINERALS)  TABS tablet Take 1 tablet by mouth daily.   Yes [provider]  zonisamide (ZONEGRAN) 100 MG capsule TAKE 3 CAPSULES EACH NIGHT Patient taking differently: Take 300 mg by mouth at bedtime.  06/03/19  Yes Cameron Sprang, MD  cloBAZam (ONFI) 2.5 MG/ML solution Take 1 mL (2.5mg ) every night Patient not taking: Reported on 07/29/2019 07/25/19   Cameron Sprang, MD  dexamethasone (DECADRON) 4 MG tablet Take 1 tablet (4 mg total) by mouth 2 (two) times daily with a meal. 07/29/19   Jacqlyn Larsen, PA-C  morphine (MSIR) 15 MG tablet Take 1 tablet (15 mg total) by mouth every 4 (four) hours as needed for up to 3 days for severe pain. 07/29/19 08/01/19  Jacqlyn Larsen, PA-C  ondansetron (ZOFRAN ODT) 4 MG disintegrating tablet 4mg  ODT q4 hours prn nausea/vomit 07/29/19   Jacqlyn Larsen, PA-C  polyethylene glycol (MIRALAX / GLYCOLAX) 17 g packet Take 17 g by mouth daily. 07/29/19   Jacqlyn Larsen, PA-C    Allergies    Chlorhexidine and Povidone-iodine  Review of Systems   Review of Systems  Constitutional: Negative for chills and fever.  HENT: Negative.   Respiratory: Negative for cough and shortness of breath.   Cardiovascular: Negative for chest pain.  Gastrointestinal: Negative for abdominal pain, nausea and vomiting.  Genitourinary: Positive for flank pain. Negative for dysuria and frequency.  Musculoskeletal: Positive for back pain.  Skin: Negative for color change and rash.  Neurological: Negative for weakness, numbness and headaches.  All other systems reviewed and are negative.   Physical Exam Updated Vital Signs BP (!) 143/81 (BP Location: Right Arm)   Pulse 86   Temp 97.6 F (36.4 C) (Oral)   Resp 15   Ht 5\' 8"  (1.727 m)   Wt 54.4 kg   SpO2 98%   BMI 18.25 kg/m   Physical Exam Vitals and nursing note reviewed.  Constitutional:      General: He is not in acute distress.    Appearance: Normal appearance. He is well-developed and normal weight. He is not ill-appearing or  diaphoretic.     Comments: At rest patient appears comfortable, but with any movement patient in severe pain  HENT:     Head: Normocephalic and atraumatic.     Mouth/Throat:  Mouth: Mucous membranes are moist.     Pharynx: Oropharynx is clear.  Eyes:     General:        Right eye: No discharge.        Left eye: No discharge.  Cardiovascular:     Rate and Rhythm: Normal rate and regular rhythm.     Heart sounds: Normal heart sounds. No murmur. No friction rub. No gallop.   Pulmonary:     Effort: Pulmonary effort is normal. No respiratory distress.     Breath sounds: Normal breath sounds. No wheezing or rales.     Comments: Respirations equal and unlabored, patient able to speak in full sentences, lungs clear to auscultation bilaterally Abdominal:     General: Bowel sounds are normal. There is no distension.     Palpations: Abdomen is soft. There is no mass.     Tenderness: There is no abdominal tenderness. There is no guarding.     Comments: Urostomy bag present with clear yellow urine.  Abdomen soft, nondistended, nontender to palpation in all quadrants without guarding or peritoneal signs  Musculoskeletal:        General: Tenderness present. No deformity.     Cervical back: Neck supple.       Back:     Comments: Tenderness over the left mid back without overlying skin changes or palpable deformity.  No focal midline tenderness  Skin:    General: Skin is warm and dry.     Capillary Refill: Capillary refill takes less than 2 seconds.  Neurological:     Mental Status: He is alert.     Coordination: Coordination normal.     Comments: Speech is clear, able to follow commands CN III-XII intact Normal strength in upper and lower extremities bilaterally including dorsiflexion and plantar flexion, strong and equal grip strength Sensation normal to light and sharp touch Moves extremities without ataxia, coordination intact  Psychiatric:        Mood and Affect: Mood normal.         Behavior: Behavior normal.     ED Results / Procedures / Treatments   Labs (all labs ordered are listed, but only abnormal results are displayed) Labs Reviewed  COMPREHENSIVE METABOLIC PANEL - Abnormal; Notable for the following components:      Result Value   Alkaline Phosphatase 176 (*)    All other components within normal limits  CBC WITH DIFFERENTIAL/PLATELET - Abnormal; Notable for the following components:   Hemoglobin 10.9 (*)    HCT 36.0 (*)    MCH 25.5 (*)    RDW 17.4 (*)    Lymphs Abs 0.6 (*)    Eosinophils Absolute 0.9 (*)    All other components within normal limits  URINALYSIS, ROUTINE W REFLEX MICROSCOPIC - Abnormal; Notable for the following components:   Leukocytes,Ua LARGE (*)    Bacteria, UA MANY (*)    All other components within normal limits  URINE CULTURE    EKG None  Radiology DG Chest 2 View  Result Date: 07/28/2019 CLINICAL DATA:  Bladder cancer, previous tobacco abuse EXAM: CHEST - 2 VIEW COMPARISON:  04/12/2013 FINDINGS: Frontal and lateral views of the chest demonstrate right chest wall port via internal jugular approach tip in the region the atrial caval junction. Nerve stimulator generator overlies left chest, lead in the left supraclavicular region. Cardiac silhouette is stable. There is a small hiatal hernia. No airspace disease, effusion, or pneumothorax. No acute bony abnormalities. IMPRESSION: 1. No acute intrathoracic process.  Electronically Signed   By: Randa Ngo M.D.   On: 07/28/2019 19:56    Procedures Procedures (including critical care time)  Medications Ordered in ED Medications  ondansetron (ZOFRAN) injection 4 mg (4 mg Intravenous Given 07/29/19 1715)  morphine 4 MG/ML injection 4 mg (4 mg Intravenous Given 07/29/19 1716)  dexamethasone (DECADRON) injection 4 mg (4 mg Intravenous Given 07/29/19 1814)  morphine 4 MG/ML injection 4 mg (4 mg Intravenous Given 07/29/19 1827)  morphine (MSIR) tablet 15 mg (15 mg Oral Given 07/29/19 1958)    fosfomycin (MONUROL) packet 3 g (3 g Oral Given 07/29/19 2042)    ED Course  I have reviewed the triage vital signs and the nursing notes.  Pertinent labs & imaging results that were available during my care of the patient were reviewed by me and considered in my medical decision making (see chart for details).    MDM Rules/Calculators/A&P                     74 year old male with bladder and prostate cancer presents to the ED for severe left back and flank pain.  Pain has been present over the past several weeks but worsening recently.  No associated neurologic deficits.  No chest pain.  Back pain occasionally radiates into the abdomen.  No fevers.  Normal urostomy output.  Patient had scans done at urology office but has not yet received the results of these.  Was previously told by his PCP that this pain less likely related to arthritis, has been taking Tylenol regularly without relief.  On arrival patient with normal vitals.  At rest he is comfortable but with any movement patient in severe pain.  He has tenderness across the left mid back without palpable deformity or skin changes.  Will get basic labs.  Will discuss with urology before ordering additional imaging as I am unable to see recent images done in patient's chart.  Patient has had increased seizure this week but none today, and after patient's wife discussed with your neurologist this seems to be due to a change in medication dose that has been resolved.  IV morphine given for pain.  Case discussed with Dr. Tresa Moore with urology who was able to review scans from appointment yesterday which shows significant metastasis to the thoracic spine and liver.  The thoracic spine metastasis is likely the cause of patient's severe back pain.  Dr. Tresa Moore recommends focusing on pain management.  Will also consult oncology.  I had long discussion with patient and his wife regarding results of scans yesterday that show new metastasis.  I was able to  answer questions for them.  They be also have questions about treatment options and survival which I feel would be better addressed by patient's oncologist, will have them schedule an outpatient appointment, but will consult them for pain management recommendations in the meantime.  Lab work shows no leukocytosis, stable hemoglobin, no significant electrolyte derangements, normal renal and liver function.  Urinalysis with large leukocytes, 21-50 WBCs and many bacteria.  No previous urinalysis available for comparison and records since patient's urostomy was in place.  I discussed this with Dr. Tyrone Nine who recommended giving patient a one-time dose of fosfomycin and sending urinalysis for culture, patient can then discuss this with his urologist.  Case discussed with Dr. Jana Hakim with oncology, he recommends starting patient on dexamethasone 4 mg twice daily continuing Tylenol and ibuprofen/naproxen, and then using MSIR 15 mg every 4 hours as needed for  severe pain.  Patient will need to be started on a bowel regimen to prevent constipation.  I discussed this plan with the patient, pain has been well managed here in the emergency department and patient is much more comfortable.  At this time he and his wife both feel comfortable going home I have encouraged them to call Monday morning to schedule follow-up with urology and oncology.  Had long discussion with patient regarding pain management regimen.  Return precautions discussed.  Patient expresses understanding and agreement.  Discharged home in good condition.  Patient discussed with Dr. Tyrone Nine, who saw patient as well and agrees with plan.   Final Clinical Impression(s) / ED Diagnoses Final diagnoses:  Acute left-sided back pain, unspecified back location  Spine metastasis [facility])    Rx / DC Orders ED Discharge Orders         Ordered    morphine (MSIR) 15 MG tablet  Every 4 hours PRN     07/29/19 2032    dexamethasone (DECADRON) 4 MG tablet  2  times daily with meals     07/29/19 2032    polyethylene glycol (MIRALAX / GLYCOLAX) 17 g packet  Daily     07/29/19 2032    ondansetron (ZOFRAN ODT) 4 MG disintegrating tablet     07/29/19 2033           Jacqlyn Larsen, PA-C 07/29/19 2106    Deno Etienne, DO 07/29/19 2148

## 2019-07-29 NOTE — ED Triage Notes (Signed)
From home via EMS  Hx: seizures, had 4 this week (wife says this is abnormal) no falls, no trauma  Patient c/o L flank pain for past week   AOx4  140/92 70 HR 20 RR 98% RA  Wife on the way.

## 2019-07-31 ENCOUNTER — Other Ambulatory Visit: Payer: Self-pay

## 2019-07-31 ENCOUNTER — Encounter (HOSPITAL_COMMUNITY): Payer: Self-pay

## 2019-07-31 ENCOUNTER — Inpatient Hospital Stay (HOSPITAL_COMMUNITY)
Admission: EM | Admit: 2019-07-31 | Discharge: 2019-08-02 | DRG: 100 | Disposition: A | Discharge status: Home / Self Care | Payer: Medicare Other | Attending: Internal Medicine | Admitting: Internal Medicine

## 2019-07-31 ENCOUNTER — Emergency Department (HOSPITAL_COMMUNITY): Payer: Medicare Other

## 2019-07-31 DIAGNOSIS — Z86010 Personal history of colonic polyps: Secondary | ICD-10-CM

## 2019-07-31 DIAGNOSIS — G893 Neoplasm related pain (acute) (chronic): Secondary | ICD-10-CM | POA: Diagnosis not present

## 2019-07-31 DIAGNOSIS — Z681 Body mass index (BMI) 19 or less, adult: Secondary | ICD-10-CM | POA: Diagnosis not present

## 2019-07-31 DIAGNOSIS — W19XXXA Unspecified fall, initial encounter: Secondary | ICD-10-CM | POA: Diagnosis not present

## 2019-07-31 DIAGNOSIS — W1839XA Other fall on same level, initial encounter: Secondary | ICD-10-CM | POA: Diagnosis present

## 2019-07-31 DIAGNOSIS — Z79899 Other long term (current) drug therapy: Secondary | ICD-10-CM | POA: Diagnosis not present

## 2019-07-31 DIAGNOSIS — C787 Secondary malignant neoplasm of liver and intrahepatic bile duct: Secondary | ICD-10-CM | POA: Diagnosis not present

## 2019-07-31 DIAGNOSIS — F3189 Other bipolar disorder: Secondary | ICD-10-CM | POA: Diagnosis present

## 2019-07-31 DIAGNOSIS — R569 Unspecified convulsions: Secondary | ICD-10-CM | POA: Diagnosis not present

## 2019-07-31 DIAGNOSIS — C679 Malignant neoplasm of bladder, unspecified: Secondary | ICD-10-CM | POA: Diagnosis present

## 2019-07-31 DIAGNOSIS — C7951 Secondary malignant neoplasm of bone: Secondary | ICD-10-CM | POA: Diagnosis present

## 2019-07-31 DIAGNOSIS — Z20822 Contact with and (suspected) exposure to covid-19: Secondary | ICD-10-CM | POA: Diagnosis present

## 2019-07-31 DIAGNOSIS — F419 Anxiety disorder, unspecified: Secondary | ICD-10-CM | POA: Diagnosis present

## 2019-07-31 DIAGNOSIS — G40219 Localization-related (focal) (partial) symptomatic epilepsy and epileptic syndromes with complex partial seizures, intractable, without status epilepticus: Secondary | ICD-10-CM | POA: Diagnosis present

## 2019-07-31 DIAGNOSIS — Z7952 Long term (current) use of systemic steroids: Secondary | ICD-10-CM | POA: Diagnosis not present

## 2019-07-31 DIAGNOSIS — Z79890 Hormone replacement therapy (postmenopausal): Secondary | ICD-10-CM

## 2019-07-31 DIAGNOSIS — R55 Syncope and collapse: Secondary | ICD-10-CM | POA: Diagnosis not present

## 2019-07-31 DIAGNOSIS — Z8551 Personal history of malignant neoplasm of bladder: Secondary | ICD-10-CM | POA: Diagnosis not present

## 2019-07-31 DIAGNOSIS — L8990 Pressure ulcer of unspecified site, unspecified stage: Secondary | ICD-10-CM | POA: Insufficient documentation

## 2019-07-31 DIAGNOSIS — L89311 Pressure ulcer of right buttock, stage 1: Secondary | ICD-10-CM | POA: Diagnosis not present

## 2019-07-31 DIAGNOSIS — R404 Transient alteration of awareness: Secondary | ICD-10-CM | POA: Diagnosis not present

## 2019-07-31 DIAGNOSIS — S0101XA Laceration without foreign body of scalp, initial encounter: Secondary | ICD-10-CM | POA: Diagnosis not present

## 2019-07-31 DIAGNOSIS — Z883 Allergy status to other anti-infective agents status: Secondary | ICD-10-CM

## 2019-07-31 DIAGNOSIS — Z906 Acquired absence of other parts of urinary tract: Secondary | ICD-10-CM

## 2019-07-31 DIAGNOSIS — Z743 Need for continuous supervision: Secondary | ICD-10-CM | POA: Diagnosis not present

## 2019-07-31 DIAGNOSIS — Z87891 Personal history of nicotine dependence: Secondary | ICD-10-CM

## 2019-07-31 DIAGNOSIS — Z8546 Personal history of malignant neoplasm of prostate: Secondary | ICD-10-CM | POA: Diagnosis not present

## 2019-07-31 DIAGNOSIS — G9341 Metabolic encephalopathy: Secondary | ICD-10-CM | POA: Diagnosis not present

## 2019-07-31 DIAGNOSIS — Z03818 Encounter for observation for suspected exposure to other biological agents ruled out: Secondary | ICD-10-CM | POA: Diagnosis not present

## 2019-07-31 DIAGNOSIS — R410 Disorientation, unspecified: Secondary | ICD-10-CM | POA: Diagnosis not present

## 2019-07-31 DIAGNOSIS — G40119 Localization-related (focal) (partial) symptomatic epilepsy and epileptic syndromes with simple partial seizures, intractable, without status epilepticus: Secondary | ICD-10-CM | POA: Diagnosis not present

## 2019-07-31 DIAGNOSIS — N400 Enlarged prostate without lower urinary tract symptoms: Secondary | ICD-10-CM | POA: Diagnosis present

## 2019-07-31 DIAGNOSIS — R64 Cachexia: Secondary | ICD-10-CM | POA: Diagnosis not present

## 2019-07-31 DIAGNOSIS — L89321 Pressure ulcer of left buttock, stage 1: Secondary | ICD-10-CM | POA: Diagnosis not present

## 2019-07-31 DIAGNOSIS — Z9079 Acquired absence of other genital organ(s): Secondary | ICD-10-CM | POA: Diagnosis not present

## 2019-07-31 LAB — CBC WITH DIFFERENTIAL/PLATELET
Abs Immature Granulocytes: 0.04 10*3/uL (ref 0.00–0.07)
Basophils Absolute: 0 10*3/uL (ref 0.0–0.1)
Basophils Relative: 0 %
Eosinophils Absolute: 0 10*3/uL (ref 0.0–0.5)
Eosinophils Relative: 0 %
HCT: 33.9 % — ABNORMAL LOW (ref 39.0–52.0)
Hemoglobin: 10.4 g/dL — ABNORMAL LOW (ref 13.0–17.0)
Immature Granulocytes: 0 %
Lymphocytes Relative: 2 %
Lymphs Abs: 0.2 10*3/uL — ABNORMAL LOW (ref 0.7–4.0)
MCH: 25.9 pg — ABNORMAL LOW (ref 26.0–34.0)
MCHC: 30.7 g/dL (ref 30.0–36.0)
MCV: 84.3 fL (ref 80.0–100.0)
Monocytes Absolute: 0.7 10*3/uL (ref 0.1–1.0)
Monocytes Relative: 7 %
Neutro Abs: 9 10*3/uL — ABNORMAL HIGH (ref 1.7–7.7)
Neutrophils Relative %: 91 %
Platelets: 375 10*3/uL (ref 150–400)
RBC: 4.02 MIL/uL — ABNORMAL LOW (ref 4.22–5.81)
RDW: 17.2 % — ABNORMAL HIGH (ref 11.5–15.5)
WBC: 9.9 10*3/uL (ref 4.0–10.5)
nRBC: 0 % (ref 0.0–0.2)

## 2019-07-31 LAB — COMPREHENSIVE METABOLIC PANEL WITH GFR
ALT: 21 U/L (ref 0–44)
AST: 21 U/L (ref 15–41)
Albumin: 3.5 g/dL (ref 3.5–5.0)
Alkaline Phosphatase: 149 U/L — ABNORMAL HIGH (ref 38–126)
Anion gap: 9 (ref 5–15)
BUN: 28 mg/dL — ABNORMAL HIGH (ref 8–23)
CO2: 24 mmol/L (ref 22–32)
Calcium: 9.5 mg/dL (ref 8.9–10.3)
Chloride: 103 mmol/L (ref 98–111)
Creatinine, Ser: 0.84 mg/dL (ref 0.61–1.24)
GFR calc Af Amer: >60 mL/min
GFR calc non Af Amer: >60 mL/min
Glucose, Bld: 116 mg/dL — ABNORMAL HIGH (ref 70–99)
Potassium: 4.8 mmol/L (ref 3.5–5.1)
Sodium: 136 mmol/L (ref 135–145)
Total Bilirubin: 0.6 mg/dL (ref 0.3–1.2)
Total Protein: 6.5 g/dL (ref 6.5–8.1)

## 2019-07-31 LAB — TROPONIN I (HIGH SENSITIVITY)
Troponin I (High Sensitivity): 2 ng/L
Troponin I (High Sensitivity): <2 ng/L (ref <18)

## 2019-07-31 MED ORDER — LIDOCAINE-EPINEPHRINE 2 %-1:100000 IJ SOLN
INTRAMUSCULAR | Status: AC
  Filled 2019-07-31: qty 1

## 2019-07-31 MED ORDER — MORPHINE SULFATE (PF) 4 MG/ML IV SOLN
4.0000 mg | Freq: Once | INTRAVENOUS | Status: AC
  Administered 2019-07-31: 4 mg via INTRAVENOUS
  Filled 2019-07-31: qty 1

## 2019-07-31 MED ORDER — LIDOCAINE-EPINEPHRINE (PF) 2 %-1:200000 IJ SOLN: 10.0000 mL | Freq: Once | INTRAMUSCULAR | Status: DC

## 2019-07-31 NOTE — ED Notes (Addendum)
Seizure pad in place.

## 2019-07-31 NOTE — ED Provider Notes (Signed)
[facility] DEPT Provider Note   CSN: [id number] Arrival date & time: 07/31/19  1814     History Chief Complaint  Patient presents with  . Seizures    Head LAC    John Holt is a 74 y.o. male.  HPI Patient presents with seizure.  History of same.  Although is not quite sure whether he actually had a seizure.  States he was outside working on a grill.  Later found lying on the ground, bleeding from the head.  Does have a history of seizure disorder and has been less well controlled.  Found out that he had been on a larger dose of his benzo than he thought and that had been decreased.  Had been having more frequent seizures after that.  However then developed severe abdominal pain.  CT scan done by urology as a follow-up CT after better cancer treatment/cystectomy.  Found to have worsening metastatic disease.  In the ER Friday.  Has been given morphine which should help control the otherwise uncontrolled pain.  Does have a hematoma to left parietal occipital area.  Not on blood thinners.  Disposed to call oncology tomorrow for potential follow-up.    Past Medical History:  Diagnosis Date  . Anxiety disorder   . Bipolar 1 disorder, mixed ([place])    w/ hx psychosis/ dissociation reactions/ suicide ideattions and attempt  . Bladder cancer [facility]) urologist-- dr Alyson Ingles ([facility]) and dr Lawerance Bach [facility] urology):  oncologist-  dr Alen Blew ([facility]) & dr Marcello Moores [facility])   dx 2018--- s/p  TURBT's ;  hx BCG tx's.  chemotherapy completed 2019, and immunotherapy Keytruda , last one 10-21-2018  . BPH (benign prostatic hypertrophy) with urinary obstruction   . History of adenomatous polyp of colon   . History of closed head injury    2000-- fell off ladder--  temporary memory loss resolved  . History of psychosis    x2  last one documented 2006  w/ hallucination  and suicide ideation  . Nocturia   . Port-A-Cath in place 2018  . S/P placement of VNS (vagus nerve  stimulation) device 01/28/2018   per pt swipe magnet twice daily  . Sigmoid diverticulosis   . Sinus bradycardia seen on cardiac monitor    cardiology --  dr g. taylor;   event monitor results 11-15-2018 in epic,  NSR w/ ST and SB and a nocturnal pause   . Temporal lobe epilepsy syndrome [facility]) neurologist-  dr Raliegh Ip. Delice Lesch-  avergae one every 2 weeks "legs jerking around, per wife , pt unaware he's doing this"   localization-related right temporal lobe --  mostly nocturnal w/ bilateral lower extremitity movement, staring and moaning then dissorietation afterwards (12-02-2018 per pt last daytime seizure 11-11-2018 and last nighttime seizure 11-29-2018  . Vesicoureteral reflux, bilateral   . Wears glasses     Patient Active Problem List   Diagnosis Date Noted  . Seizure ([place]) 07/31/2019  . Status post ileal conduit ([place]) 02/25/2019  . Bladder cancer ([place]) 02/25/2019  . Sinus bradycardia 10/26/2018  . Malignant neoplasm of posterior wall of urinary bladder ([place]) 02/24/2017  . Benign prostatic hyperplasia with urinary obstruction 08/18/2016  . ED (erectile dysfunction) of organic origin 08/18/2016  . Focal epilepsy with impairment of consciousness, intractable ([place]) 04/30/2014  . Memory loss 04/30/2014  . Localization-related symptomatic epilepsy and epileptic syndromes with complex partial seizures, intractable, without status epilepticus ([place]) 08/11/2012    Past Surgical History:  Procedure Laterality Date  .  COLONOSCOPY    . COLONOSCOPY W/ POLYPECTOMY  09-11-2009  . CYSTOSCOPY W/ RETROGRADES Bilateral 08/13/2018   Procedure: CYSTOSCOPY WITH RETROGRADE PYELOGRAM;  Surgeon: Cleon Gustin, MD;  Location: [facility];  Service: Urology;  Laterality: Bilateral;  . CYSTOSCOPY WITH INJECTION N/A 02/25/2019   Procedure: CYSTOSCOPY WITH INJECTION;  Surgeon: Alexis Frock, MD;  Location: [facility];  Service: Urology;  Laterality: N/A;  . GREEN LIGHT LASER TURP (TRANSURETHRAL RESECTION OF PROSTATE  N/A 04/07/2014   Procedure: GREEN LIGHT LASER TURP (TRANSURETHRAL RESECTION OF PROSTATE;  Surgeon: Ailene Rud, MD;  Location[facility];  Service: Urology;  Laterality: N/A;  . INGUINAL HERNIA REPAIR Bilateral 04/15/2013   Procedure: LAPAROSCOPIC BILATERAL INGUINAL HERNIA REPAIR;  Surgeon: Gayland Curry, MD;  Location: [facility];  Service: General;  Laterality: Bilateral;  . INSERTION OF MESH N/A 04/15/2013   Procedure: INSERTION OF MESH;  Surgeon: Gayland Curry, MD;  Location: [facility];  Service: General;  Laterality: N/A;  . PORTACATH PLACEMENT  2018  . TRANSURETHRAL RESECTION OF BLADDER  01-27-2017;  05-26-2017;  05-25-2018  both by dr Chriss Czar davis @[facility]   . TRANSURETHRAL RESECTION OF BLADDER TUMOR N/A 08/13/2018   Procedure: TRANSURETHRAL RESECTION OF BLADDER TUMOR (TURBT);  Surgeon: Cleon Gustin, MD;  Location: [facility];  Service: Urology;  Laterality: N/A;  1 HR  . TRANSURETHRAL RESECTION OF BLADDER TUMOR N/A 12/03/2018   Procedure: TRANSURETHRAL RESECTION OF BLADDER TUMOR (TURBT);  Surgeon: Cleon Gustin, MD;  Location: [facility];  Service: Urology;  Laterality: N/A;  . UMBILICAL HERNIA REPAIR N/A 04/15/2013   Procedure: OPEN HERNIA REPAIR UMBILICAL ADULT;  Surgeon: Gayland Curry, MD;  Location: [facility];  Service: General;  Laterality: N/A;  . VAGUS NERVE STIMULATOR INSERTION Left 01/28/2018   Procedure: VAGAL NERVE STIMULATOR PLACEMENT;  Surgeon: Consuella Lose, MD;  Location: [facility];  Service: Neurosurgery;  Laterality: Left;  VAGAL NERVE STIMULATOR PLACEMENT       Family History  Problem Relation Age of Onset  . Seizures Father   . Heart disease Father   . Cancer Father        prostat  . Cancer Mother        stomach  . Stomach cancer Mother   . Colon cancer Neg Hx   . Colon polyps Neg Hx   . Esophageal cancer Neg Hx   . Rectal cancer Neg Hx     Social History   Tobacco Use  . Smoking status: Former Smoker    Packs/day: 1.00     Years: 15.00    Pack years: 15.00    Types: Cigarettes    Quit date: 04/03/1972    Years since quitting: 47.3  . Smokeless tobacco: Never Used  Substance Use Topics  . Alcohol use: Yes    Alcohol/week: 14.0 standard drinks    Types: 14 Glasses of wine per week    Comment: 1 or 2  wine daily; no consumption in last 24 hours  . Drug use: No    Home Medications Prior to Admission medications   Medication Sig Start Date End Date Taking? Authorizing Provider  cloBAZam (ONFI) 2.5 MG/ML solution Take 1 mL (2.5mg ) every night Patient not taking: Reported on 07/29/2019 07/25/19   Cameron Sprang, MD  clonazePAM (KLONOPIN) 0.5 MG tablet Take 0.5 mg by mouth 2 (two) times daily as needed (For seizures per wife).     [provider]  dexamethasone (DECADRON) 4 MG tablet Take  1 tablet (4 mg total) by mouth 2 (two) times daily with a meal. 07/29/19   Jacqlyn Larsen, PA-C  levothyroxine (SYNTHROID) 125 MCG tablet Take 125 mcg by mouth daily before breakfast.    [provider]  loperamide (IMODIUM A-D) 2 MG tablet Take 2 mg by mouth 4 (four) times daily as needed for diarrhea or loose stools.    [provider]  morphine (MSIR) 15 MG tablet Take 1 tablet (15 mg total) by mouth every 4 (four) hours as needed for up to 3 days for severe pain. 07/29/19 08/01/19  Jacqlyn Larsen, PA-C  Multiple Vitamin (MULTIVITAMIN WITH MINERALS) TABS tablet Take 1 tablet by mouth daily.    [provider]  ondansetron (ZOFRAN ODT) 4 MG disintegrating tablet 4mg  ODT q4 hours prn nausea/vomit 07/29/19   Jacqlyn Larsen, PA-C  polyethylene glycol (MIRALAX / GLYCOLAX) 17 g packet Take 17 g by mouth daily. 07/29/19   Jacqlyn Larsen, PA-C  zonisamide (ZONEGRAN) 100 MG capsule TAKE 3 CAPSULES EACH NIGHT Patient taking differently: Take 300 mg by mouth at bedtime.  06/03/19   Cameron Sprang, MD    Allergies    Chlorhexidine and Povidone-iodine  Review of Systems   Review of Systems  Constitutional:  Negative for appetite change.  HENT: Negative for congestion.   Respiratory: Negative for shortness of breath.   Gastrointestinal: Positive for abdominal pain.  Genitourinary: Negative for flank pain.  Musculoskeletal: Positive for back pain.  Skin: Positive for wound. Negative for rash.  Neurological: Positive for seizures and syncope.  Psychiatric/Behavioral: Negative for confusion.    Physical Exam Updated Vital Signs BP (!) 136/94   Pulse 85   Temp 98.1 F (36.7 C) (Oral)   Resp 18   Ht 5\' 8"  (1.727 m)   Wt 54.4 kg   SpO2 (!) 75%   BMI 18.25 kg/m   Physical Exam Vitals and nursing note reviewed.  HENT:     Head:     Comments: Hematoma to left parietal occipital area    Mouth/Throat:     Mouth: Mucous membranes are moist.  Eyes:     Extraocular Movements: Extraocular movements intact.     Pupils: Pupils are equal, round, and reactive to light.  Cardiovascular:     Rate and Rhythm: Normal rate and regular rhythm.  Pulmonary:     Breath sounds: No wheezing or rhonchi.  Abdominal:     Tenderness: There is no abdominal tenderness.  Musculoskeletal:        General: No tenderness.     Cervical back: Neck supple.  Skin:    Capillary Refill: Capillary refill takes less than 2 seconds.  Neurological:     Mental Status: He is alert and oriented to person, place, and time.   Approximately 2 cm laceration on hematoma  ED Results / Procedures / Treatments   Labs (all labs ordered are listed, but only abnormal results are displayed) Labs Reviewed  COMPREHENSIVE METABOLIC PANEL - Abnormal; Notable for the following components:      Result Value   Glucose, Bld 116 (*)    BUN 28 (*)    Alkaline Phosphatase 149 (*)    All other components within normal limits  CBC WITH DIFFERENTIAL/PLATELET - Abnormal; Notable for the following components:   RBC 4.02 (*)    Hemoglobin 10.4 (*)    HCT 33.9 (*)    MCH 25.9 (*)    RDW 17.2 (*)    Neutro Abs  9.0 (*)    Lymphs Abs 0.2  (*)    All other components within normal limits  SARS CORONAVIRUS 2 (TAT 6-24 HRS)  TROPONIN I (HIGH SENSITIVITY)  TROPONIN I (HIGH SENSITIVITY)    EKG EKG Interpretation  Date/Time:  Sunday July 31 2019 20:27:04 EDT Ventricular Rate:  64 PR Interval:    QRS Duration: 106 QT Interval:  386 QTC Calculation: 399 R Axis:   81 Text Interpretation: Sinus rhythm Borderline right axis deviation Minimal ST elevation, anterior leads Confirmed by Davonna Belling [phone]) on 07/31/2019 9:22:46 PM   Radiology CT Head Wo Contrast  Result Date: 07/31/2019 CLINICAL DATA:  Seizure EXAM: CT HEAD WITHOUT CONTRAST TECHNIQUE: Contiguous axial images were obtained from the base of the skull through the vertex without intravenous contrast. COMPARISON:  12/04/2018 FINDINGS: Brain: Mild age related volume loss. No acute intracranial abnormality. Specifically, no hemorrhage, hydrocephalus, mass lesion, acute infarction, or significant intracranial injury. Vascular: No hyperdense vessel or unexpected calcification. Skull: No acute calvarial abnormality. Sinuses/Orbits: Visualized paranasal sinuses and mastoids clear. Orbital soft tissues unremarkable. Other: None IMPRESSION: No acute intracranial abnormality. Electronically Signed   By: Rolm Baptise M.D.   On: 07/31/2019 19:42    Procedures .Marland KitchenLaceration Repair  Date/Time: 07/31/2019 11:50 PM Performed by: Davonna Belling, MD Authorized by: Davonna Belling, MD   Consent:    Consent obtained:  Verbal   Consent given by:  Patient   Risks discussed:  Infection and pain   Alternatives discussed:  No treatment, delayed treatment and observation Anesthesia (see MAR for exact dosages):    Anesthesia method:  Local infiltration   Local anesthetic:  Lidocaine 2% WITH epi Laceration details:    Location:  Scalp   Scalp location:  Occipital   Length (cm):  2 Repair type:    Repair type:  Simple Pre-procedure details:    Preparation:  Patient was prepped  and draped in usual sterile fashion Exploration:    Wound exploration: wound explored through full range of motion     Contaminated: no   Treatment:    Area cleansed with:  Saline   Amount of cleaning:  Standard Skin repair:    Repair method:  Sutures   Suture size:  4-0   Suture material:  Prolene   Suture technique: 3 simple interrupted and 1 horizontal mattress.   Number of sutures:  4 Approximation:    Approximation:  Close Post-procedure details:    Dressing:  Open (no dressing)   Patient tolerance of procedure:  Tolerated well, no immediate complications   (including critical care time)  Medications Ordered in ED Medications  lidocaine-EPINEPHrine (XYLOCAINE W/EPI) 2 %-1:200000 (PF) injection 10 mL (has no administration in time range)  lidocaine-EPINEPHrine (XYLOCAINE W/EPI) 2 %-1:100000 (with pres) injection (has no administration in time range)  morphine 4 MG/ML injection 4 mg (4 mg Intravenous Given 07/31/19 2057)    ED Course  I have reviewed the triage vital signs and the nursing notes.  Pertinent labs & imaging results that were available during my care of the patient were reviewed by me and considered in my medical decision making (see chart for details).    MDM Rules/Calculators/A&P                      Patient with syncopal episode.  Unknown cause.  Did hit head but head CT reassuring.  Does have history of seizures.  Has been having more seizures due to decrease of benzos but has had  more stability recently.  Recently also found out that his bladder cancer has returned throughout his abdomen.  This post have oncology follow-up but has not been arranged yet.  Seen 2 days ago and started on morphine at that time.  Pain since better controlled on the morphine.  However with the new syncope and new return the cancer feel the patient would potentially benefit from [place] the hospital for cardiac monitoring and potentially oncology consult. Final Clinical Impression(s)  / ED Diagnoses Final diagnoses:  Syncope, unspecified syncope type  Laceration of scalp, initial encounter    Rx / DC Orders ED Discharge Orders    None       Davonna Belling, MD 07/31/19 2351

## 2019-07-31 NOTE — ED Triage Notes (Signed)
Pt arrived via [facility] from home CC head LAC r/t seizure activity. Pt's wife witnessed seizure and reports pt hitting head on falling but denies LOC. Per EMS on arrival A/OX4 ambulatory.   18G left AC   Hx Seizures   137/85  98  80 100%

## 2019-08-01 ENCOUNTER — Telehealth: Payer: Self-pay | Admitting: Neurology

## 2019-08-01 ENCOUNTER — Other Ambulatory Visit: Payer: Self-pay | Admitting: Oncology

## 2019-08-01 DIAGNOSIS — R569 Unspecified convulsions: Secondary | ICD-10-CM | POA: Diagnosis not present

## 2019-08-01 DIAGNOSIS — Z20822 Contact with and (suspected) exposure to covid-19: Secondary | ICD-10-CM | POA: Diagnosis not present

## 2019-08-01 DIAGNOSIS — G9341 Metabolic encephalopathy: Secondary | ICD-10-CM | POA: Diagnosis not present

## 2019-08-01 DIAGNOSIS — C787 Secondary malignant neoplasm of liver and intrahepatic bile duct: Secondary | ICD-10-CM | POA: Diagnosis not present

## 2019-08-01 DIAGNOSIS — Z681 Body mass index (BMI) 19 or less, adult: Secondary | ICD-10-CM | POA: Diagnosis not present

## 2019-08-01 DIAGNOSIS — F3189 Other bipolar disorder: Secondary | ICD-10-CM | POA: Diagnosis not present

## 2019-08-01 DIAGNOSIS — R64 Cachexia: Secondary | ICD-10-CM | POA: Diagnosis not present

## 2019-08-01 DIAGNOSIS — C7951 Secondary malignant neoplasm of bone: Secondary | ICD-10-CM | POA: Diagnosis not present

## 2019-08-01 DIAGNOSIS — G40119 Localization-related (focal) (partial) symptomatic epilepsy and epileptic syndromes with simple partial seizures, intractable, without status epilepticus: Secondary | ICD-10-CM | POA: Diagnosis not present

## 2019-08-01 DIAGNOSIS — L8990 Pressure ulcer of unspecified site, unspecified stage: Secondary | ICD-10-CM | POA: Insufficient documentation

## 2019-08-01 DIAGNOSIS — L89321 Pressure ulcer of left buttock, stage 1: Secondary | ICD-10-CM | POA: Diagnosis not present

## 2019-08-01 LAB — CBC
HCT: 32.8 % — ABNORMAL LOW (ref 39.0–52.0)
Hemoglobin: 10 g/dL — ABNORMAL LOW (ref 13.0–17.0)
MCH: 25.6 pg — ABNORMAL LOW (ref 26.0–34.0)
MCHC: 30.5 g/dL (ref 30.0–36.0)
MCV: 83.9 fL (ref 80.0–100.0)
Platelets: 391 10*3/uL (ref 150–400)
RBC: 3.91 MIL/uL — ABNORMAL LOW (ref 4.22–5.81)
RDW: 17.4 % — ABNORMAL HIGH (ref 11.5–15.5)
WBC: 8.4 10*3/uL (ref 4.0–10.5)
nRBC: 0 % (ref 0.0–0.2)

## 2019-08-01 LAB — COMPREHENSIVE METABOLIC PANEL
ALT: 17 U/L (ref 0–44)
AST: 18 U/L (ref 15–41)
Albumin: 3.2 g/dL — ABNORMAL LOW (ref 3.5–5.0)
Alkaline Phosphatase: 138 U/L — ABNORMAL HIGH (ref 38–126)
Anion gap: 7 (ref 5–15)
BUN: 23 mg/dL (ref 8–23)
CO2: 25 mmol/L (ref 22–32)
Calcium: 9 mg/dL (ref 8.9–10.3)
Chloride: 106 mmol/L (ref 98–111)
Creatinine, Ser: 0.78 mg/dL (ref 0.61–1.24)
GFR calc Af Amer: >60 mL/min (ref >60)
GFR calc non Af Amer: >60 mL/min (ref >60)
Glucose, Bld: 101 mg/dL — ABNORMAL HIGH (ref 70–99)
Potassium: 4.1 mmol/L (ref 3.5–5.1)
Sodium: 138 mmol/L (ref 135–145)
Total Bilirubin: 0.3 mg/dL (ref 0.3–1.2)
Total Protein: 6 g/dL — ABNORMAL LOW (ref 6.5–8.1)

## 2019-08-01 LAB — SARS CORONAVIRUS 2 (TAT 6-24 HRS): SARS Coronavirus 2: NEGATIVE

## 2019-08-01 LAB — URINE CULTURE: Culture: >=100000 — AB

## 2019-08-01 MED ORDER — MORPHINE SULFATE 15 MG PO TABS
15.0000 mg | ORAL_TABLET | ORAL | Status: DC | PRN
  Administered 2019-08-01 (×2): 15 mg via ORAL
  Filled 2019-08-01 (×2): qty 1

## 2019-08-01 MED ORDER — ZONISAMIDE 100 MG PO CAPS
300.0000 mg | ORAL_CAPSULE | Freq: Every day | ORAL | Status: DC
  Administered 2019-08-01 (×2): 300 mg via ORAL
  Filled 2019-08-01 (×3): qty 3

## 2019-08-01 MED ORDER — DEXAMETHASONE 4 MG PO TABS
4.0000 mg | ORAL_TABLET | Freq: Two times a day (BID) | ORAL | Status: DC
  Administered 2019-08-01 – 2019-08-02 (×3): 4 mg via ORAL
  Filled 2019-08-01 (×3): qty 1

## 2019-08-01 MED ORDER — ACETAMINOPHEN 650 MG RE SUPP: 650.0000 mg | Freq: Four times a day (QID) | RECTAL | Status: DC | PRN

## 2019-08-01 MED ORDER — ENOXAPARIN SODIUM 40 MG/0.4ML ~~LOC~~ SOLN
40.0000 mg | Freq: Every day | SUBCUTANEOUS | Status: DC
  Administered 2019-08-01 – 2019-08-02 (×2): 40 mg via SUBCUTANEOUS
  Filled 2019-08-01 (×2): qty 0.4

## 2019-08-01 MED ORDER — CLOBAZAM 10 MG PO TABS
10.0000 mg | ORAL_TABLET | Freq: Every day | ORAL | Status: DC
  Administered 2019-08-01: 22:00:00 10 mg via ORAL
  Filled 2019-08-01: qty 1

## 2019-08-01 MED ORDER — ORAL CARE MOUTH RINSE
15.0000 mL | Freq: Two times a day (BID) | OROMUCOSAL | Status: DC
  Administered 2019-08-01 – 2019-08-02 (×3): 15 mL via OROMUCOSAL

## 2019-08-01 MED ORDER — ACETAMINOPHEN 325 MG PO TABS
650.0000 mg | ORAL_TABLET | Freq: Four times a day (QID) | ORAL | Status: DC | PRN
  Administered 2019-08-02: 06:00:00 650 mg via ORAL
  Filled 2019-08-01: qty 2

## 2019-08-01 MED ORDER — DEXTROSE-NACL 5-0.9 % IV SOLN: INTRAVENOUS | Status: DC

## 2019-08-01 MED ORDER — LEVOTHYROXINE SODIUM 125 MCG PO TABS
125.0000 ug | ORAL_TABLET | Freq: Every day | ORAL | Status: DC
  Administered 2019-08-01 – 2019-08-02 (×2): 125 ug via ORAL
  Filled 2019-08-01 (×2): qty 1

## 2019-08-01 MED ORDER — CLONAZEPAM 0.5 MG PO TABS: 0.5000 mg | ORAL_TABLET | Freq: Two times a day (BID) | ORAL | Status: DC | PRN

## 2019-08-01 MED ORDER — ONDANSETRON HCL 4 MG/2ML IJ SOLN: 4.0000 mg | Freq: Four times a day (QID) | INTRAMUSCULAR | Status: DC | PRN

## 2019-08-01 MED ORDER — POLYETHYLENE GLYCOL 3350 17 G PO PACK
17.0000 g | PACK | Freq: Every day | ORAL | Status: DC
  Administered 2019-08-01 – 2019-08-02 (×2): 17 g via ORAL
  Filled 2019-08-01 (×2): qty 1

## 2019-08-01 MED ORDER — CLOBAZAM 2.5 MG/ML PO SUSP: 2.5000 mg | Freq: Every day | ORAL | Status: DC

## 2019-08-01 MED ORDER — CLOBAZAM 10 MG PO TABS: 5.0000 mg | ORAL_TABLET | Freq: Every day | ORAL | Status: DC

## 2019-08-01 MED ORDER — ONDANSETRON HCL 4 MG PO TABS: 4.0000 mg | ORAL_TABLET | Freq: Four times a day (QID) | ORAL | Status: DC | PRN

## 2019-08-01 MED ORDER — CLOBAZAM 10 MG PO TABS
2.5000 mg | ORAL_TABLET | Freq: Once | ORAL | Status: AC
  Administered 2019-08-01: 5 mg via ORAL

## 2019-08-01 MED ORDER — KATE FARMS STANDARD 1.4 PO LIQD
325.0000 mL | ORAL | Status: DC
  Administered 2019-08-01: 325 mL via ORAL
  Filled 2019-08-01 (×2): qty 325

## 2019-08-01 NOTE — Telephone Encounter (Signed)
Patient's wife called and left a message on the holiday weekend stating, "My husband is screaming in pain. I need someone to call me back right away."   Patient helped through MyChart by Dr. Delice Lesch.

## 2019-08-01 NOTE — H&P (Signed)
History and Physical   John Holt [id number] DOB: [birth year] DOA: 07/31/2019  Referring MD/NP/PA: Dr. Alvino Chapel  PCP: [name], MD   Outpatient Specialists: Dr. Alen Blew, oncology  Patient coming from: Home  Chief Complaint: Seizure versus syncope  HPI: John Holt is a 73 y.o. male with medical history significant of seizure disorder, bipolar disorder, bladder cancer status post radical cystectomy 2 years ago with chemotherapy performed at [facility], who also follows with neurologist that has been having significant back pain lately followed by 4 episode of seizure last week.  This happened on Thursday and Friday.  Patient was seen by his PCP on Thursday after follow-up with neurology.  He was seen on 329 and 401 via telephone by the neurologist.  His seizure disorder was thought to be S Seizure due to ongoing low back pain.  Patient is supposed to be on Onfi 5 mg.  He was given that on April 1.  CT abdomen and pelvis was done as a follow-up and palpation did not get the results by Friday when they came to the ER.  CT results not in our system but reported to the ER on Friday that patient has now metastasis diffusely throughout the abdomen.  This was thought to be the cause of his severe back pain for which he has been to the surgeon as well as his primary care physician in the last 2 weeks.  He was discharged home but returned again today with worsening pain as well as another seizure episode last night.  He is being admitted at the moment for evaluation of seizure versus syncope.  Denied any ongoing fever.  No dysuria.  Patient has had radical cystectomy with ileal conduit.  He has not seen his oncologist since his surgery..  ED Course: Temperature 98.1 blood pressure 148/78 pulse 85 respiratory rate of 18 oxygen sat 75% on room air currently 100% on 2 L.  White count 9.9 hemoglobin 10.4 and platelets 375.  Chemistry entirely within normal except for BUN of 28 and glucose  116.  Urine culture from 2 days ago is growing Serratia marcescens.  Chest CT and head CT without contrast today showed no acute findings.  Patient being admitted with possible SK of seizure with reported metastatic intra-abdominal disease which may be contributing to his ongoing back pain.  Review of Systems: As per HPI otherwise 10 point review of systems negative.    Past Medical History:  Diagnosis Date  . Anxiety disorder   . Bipolar 1 disorder, mixed ([place])    w/ hx psychosis/ dissociation reactions/ suicide ideattions and attempt  . Bladder cancer [facility]) urologist-- dr Alyson Ingles ([facility]) and dr Lawerance [facility]):  oncologist-  dr Alen Blew ([facility]) & dr Marcello Moores [facility])   dx 2018--- s/p  TURBT's ;  hx BCG tx's.  chemotherapy completed 2019, and immunotherapy Keytruda , last one 10-21-2018  . BPH (benign prostatic hypertrophy) with urinary obstruction   . History of adenomatous polyp of colon   . History of closed head injury    2000-- fell off ladder--  temporary memory loss resolved  . History of psychosis    x2  last one documented 2006  w/ hallucination  and suicide ideation  . Nocturia   . Port-A-Cath in place 2018  . S/P placement of VNS (vagus nerve stimulation) device 01/28/2018   per pt swipe magnet twice daily  . Sigmoid diverticulosis   . Sinus bradycardia seen on cardiac monitor  cardiology --  dr g. taylor;   event monitor results 11-15-2018 in epic,  NSR w/ ST and SB and a nocturnal pause   . Temporal lobe epilepsy syndrome [facility]) neurologist-  dr Raliegh Ip. Delice Lesch-  avergae one every 2 weeks "legs jerking around, per wife , pt unaware he's doing this"   localization-related right temporal lobe --  mostly nocturnal w/ bilateral lower extremitity movement, staring and moaning then dissorietation afterwards (12-02-2018 per pt last daytime seizure 11-11-2018 and last nighttime seizure 11-29-2018  . Vesicoureteral reflux, bilateral   . Wears glasses     Past Surgical  History:  Procedure Laterality Date  . COLONOSCOPY    . COLONOSCOPY W/ POLYPECTOMY  09-11-2009  . CYSTOSCOPY W/ RETROGRADES Bilateral 08/13/2018   Procedure: CYSTOSCOPY WITH RETROGRADE PYELOGRAM;  Surgeon: Cleon Gustin, MD;  Location: [facility];  Service: Urology;  Laterality: Bilateral;  . CYSTOSCOPY WITH INJECTION N/A 02/25/2019   Procedure: CYSTOSCOPY WITH INJECTION;  Surgeon: Alexis Frock, MD;  Location: [facility];  Service: Urology;  Laterality: N/A;  . GREEN LIGHT LASER TURP (TRANSURETHRAL RESECTION OF PROSTATE N/A 04/07/2014   Procedure: GREEN LIGHT LASER TURP (TRANSURETHRAL RESECTION OF PROSTATE;  Surgeon: Ailene Rud, MD;  Location[facility];  Service: Urology;  Laterality: N/A;  . INGUINAL HERNIA REPAIR Bilateral 04/15/2013   Procedure: LAPAROSCOPIC BILATERAL INGUINAL HERNIA REPAIR;  Surgeon: Gayland Curry, MD;  Location: [facility];  Service: General;  Laterality: Bilateral;  . INSERTION OF MESH N/A 04/15/2013   Procedure: INSERTION OF MESH;  Surgeon: Gayland Curry, MD;  Location: [facility];  Service: General;  Laterality: N/A;  . PORTACATH PLACEMENT  2018  . TRANSURETHRAL RESECTION OF BLADDER  01-27-2017;  05-26-2017;  05-25-2018  both by dr Chriss Czar davis @[email or web address]   . TRANSURETHRAL RESECTION OF BLADDER TUMOR N/A 08/13/2018   Procedure: TRANSURETHRAL RESECTION OF BLADDER TUMOR (TURBT);  Surgeon: Cleon Gustin, MD;  Location: [facility];  Service: Urology;  Laterality: N/A;  1 HR  . TRANSURETHRAL RESECTION OF BLADDER TUMOR N/A 12/03/2018   Procedure: TRANSURETHRAL RESECTION OF BLADDER TUMOR (TURBT);  Surgeon: Cleon Gustin, MD;  Location: [facility];  Service: Urology;  Laterality: N/A;  . UMBILICAL HERNIA REPAIR N/A 04/15/2013   Procedure: OPEN HERNIA REPAIR UMBILICAL ADULT;  Surgeon: Gayland Curry, MD;  Location: [facility];  Service: General;  Laterality: N/A;  . VAGUS NERVE STIMULATOR INSERTION Left 01/28/2018   Procedure: VAGAL NERVE STIMULATOR  PLACEMENT;  Surgeon: Consuella Lose, MD;  Location: [place];  Service: Neurosurgery;  Laterality: Left;  VAGAL NERVE STIMULATOR PLACEMENT     reports that he quit smoking about 47 years ago. His smoking use included cigarettes. He has a 15.00 pack-year smoking history. He has never used smokeless tobacco. He reports current alcohol use of about 14.0 standard drinks of alcohol per week. He reports that he does not use drugs.  Allergies  Allergen Reactions  . Chlorhexidine Rash    Full body  . Povidone-Iodine Rash    Family History  Problem Relation Age of Onset  . Seizures Father   . Heart disease Father   . Cancer Father        prostat  . Cancer Mother        stomach  . Stomach cancer Mother   . Colon cancer Neg Hx   . Colon polyps Neg Hx   . Esophageal cancer Neg Hx   . Rectal cancer Neg Hx  Prior to Admission medications   Medication Sig Start Date End Date Taking? Authorizing Provider  cloBAZam (ONFI) 2.5 MG/ML solution Take 1 mL (2.5mg ) every night Patient not taking: Reported on 07/29/2019 07/25/19   Cameron Sprang, MD  clonazePAM (KLONOPIN) 0.5 MG tablet Take 0.5 mg by mouth 2 (two) times daily as needed (For seizures per wife).     [provider]  dexamethasone (DECADRON) 4 MG tablet Take 1 tablet (4 mg total) by mouth 2 (two) times daily with a meal. 07/29/19   Jacqlyn Larsen, PA-C  levothyroxine (SYNTHROID) 125 MCG tablet Take 125 mcg by mouth daily before breakfast.    [provider]  loperamide (IMODIUM A-D) 2 MG tablet Take 2 mg by mouth 4 (four) times daily as needed for diarrhea or loose stools.    [provider]  morphine (MSIR) 15 MG tablet Take 1 tablet (15 mg total) by mouth every 4 (four) hours as needed for up to 3 days for severe pain. 07/29/19 08/01/19  Jacqlyn Larsen, PA-C  Multiple Vitamin (MULTIVITAMIN WITH MINERALS) TABS tablet Take 1 tablet by mouth daily.    [provider]  ondansetron (ZOFRAN ODT) 4 MG  disintegrating tablet 4mg  ODT q4 hours prn nausea/vomit 07/29/19   Jacqlyn Larsen, PA-C  polyethylene glycol (MIRALAX / GLYCOLAX) 17 g packet Take 17 g by mouth daily. 07/29/19   Jacqlyn Larsen, PA-C  zonisamide (ZONEGRAN) 100 MG capsule TAKE 3 CAPSULES EACH NIGHT Patient taking differently: Take 300 mg by mouth at bedtime.  06/03/19   Cameron Sprang, MD    Physical Exam: Vitals:   07/31/19 2145 07/31/19 2200 07/31/19 2300 08/01/19 0000  BP: 117/71 135/73 (!) 136/94 124/71  Pulse: (!) 57  85   Resp: 12 15 18 11   Temp:      TempSrc:      SpO2: 100%  (!) 75%   Weight:      Height:          Constitutional: Chronically ill looking, slightly cachectic Vitals:   07/31/19 2145 07/31/19 2200 07/31/19 2300 08/01/19 0000  BP: 117/71 135/73 (!) 136/94 124/71  Pulse: (!) 57  85   Resp: 12 15 18 11   Temp:      TempSrc:      SpO2: 100%  (!) 75%   Weight:      Height:       Eyes: PERRL, lids and conjunctivae normal ENMT: Mucous membranes are dry. Posterior pharynx clear of any exudate or lesions.Normal dentition.  Neck: normal, supple, no masses, no thyromegaly Respiratory: clear to auscultation bilaterally, no wheezing, no crackles. Normal respiratory effort. No accessory muscle use.  Cardiovascular: Sinus bradycardia no murmurs / rubs / gallops. No extremity edema. 2+ pedal pulses. No carotid bruits.  Abdomen: no tenderness, no masses palpated. No hepatosplenomegaly. Bowel sounds positive.  Musculoskeletal: no clubbing / cyanosis. No joint deformity upper and lower extremities. Good ROM, no contractures. Normal muscle tone.  Skin: no rashes, lesions, ulcers. No induration Neurologic: CN 2-12 grossly intact. Sensation intact, DTR normal. Strength 5/5 in all 4.  Psychiatric: Normal judgment and insight. Alert and oriented x 3. Normal mood.     Labs on Admission: I have personally reviewed following labs and imaging studies  CBC: Recent Labs  Lab 07/29/19 1630 07/31/19 1943  WBC 8.3  9.9  NEUTROABS 5.9 9.0*  HGB 10.9* 10.4*  HCT 36.0* 33.9*  MCV 84.3 84.3  PLT 345 [id number]   Basic Metabolic  Panel: Recent Labs  Lab 07/29/19 1630 07/31/19 1943  NA 140 136  K 4.2 4.8  CL 104 103  CO2 26 24  GLUCOSE 91 116*  BUN 22 28*  CREATININE 0.98 0.84  CALCIUM 9.4 9.5   GFR: Estimated Creatinine Clearance: 57.6 mL/min (by C-G formula based on SCr of 0.84 mg/dL). Liver Function Tests: Recent Labs  Lab 07/29/19 1630 07/31/19 1943  AST 25 21  ALT 24 21  ALKPHOS 176* 149*  BILITOT 0.9 0.6  PROT 6.5 6.5  ALBUMIN 3.7 3.5   No results for input(s): LIPASE, AMYLASE in the last 168 hours. No results for input(s): AMMONIA in the last 168 hours. Coagulation Profile: No results for input(s): INR, PROTIME in the last 168 hours. Cardiac Enzymes: No results for input(s): CKTOTAL, CKMB, CKMBINDEX, TROPONINI in the last 168 hours. BNP (last 3 results) No results for input(s): PROBNP in the last 8760 hours. HbA1C: No results for input(s): HGBA1C in the last 72 hours. CBG: No results for input(s): GLUCAP in the last 168 hours. Lipid Profile: No results for input(s): CHOL, HDL, LDLCALC, TRIG, CHOLHDL, LDLDIRECT in the last 72 hours. Thyroid Function Tests: No results for input(s): TSH, T4TOTAL, FREET4, T3FREE, THYROIDAB in the last 72 hours. Anemia Panel: No results for input(s): VITAMINB12, FOLATE, FERRITIN, TIBC, IRON, RETICCTPCT in the last 72 hours. Urine analysis:    Component Value Date/Time   COLORURINE YELLOW 07/29/2019 1706   APPEARANCEUR CLEAR 07/29/2019 1706   LABSPEC 1.005 07/29/2019 1706   PHURINE 7.0 07/29/2019 1706   GLUCOSEU NEGATIVE 07/29/2019 1706   HGBUR NEGATIVE 07/29/2019 1706   BILIRUBINUR NEGATIVE 07/29/2019 1706   KETONESUR NEGATIVE 07/29/2019 1706   PROTEINUR NEGATIVE 07/29/2019 1706   UROBILINOGEN 0.2 06/11/2010 1135   NITRITE NEGATIVE 07/29/2019 1706   LEUKOCYTESUR LARGE (A) 07/29/2019 1706   Sepsis  Labs: @LABRCNTIP (procalcitonin:4,lacticidven:4) ) Recent Results (from the past 240 hour(s))  Urine culture     Status: Abnormal (Preliminary result)   Collection Time: 07/29/19  8:07 PM   Specimen: Urine, Random  Result Value Ref Range Status   Specimen Description   Final    URINE, RANDOM Performed at [facility], [place] [address], [place], [place] [address]    Special Requests   Final    NONE Performed at [facility], [place] [address] [place], [place] [address]    Culture >=100,000 COLONIES/mL SERRATIA MARCESCENS (A)  Final   Report Status PENDING  Incomplete     Radiological Exams on Admission: CT Head Wo Contrast  Result Date: 07/31/2019 CLINICAL DATA:  Seizure EXAM: CT HEAD WITHOUT CONTRAST TECHNIQUE: Contiguous axial images were obtained from the base of the skull through the vertex without intravenous contrast. COMPARISON:  12/04/2018 FINDINGS: Brain: Mild age related volume loss. No acute intracranial abnormality. Specifically, no hemorrhage, hydrocephalus, mass lesion, acute infarction, or significant intracranial injury. Vascular: No hyperdense vessel or unexpected calcification. Skull: No acute calvarial abnormality. Sinuses/Orbits: Visualized paranasal sinuses and mastoids clear. Orbital soft tissues unremarkable. Other: None IMPRESSION: No acute intracranial abnormality. Electronically Signed   By: Rolm Baptise M.D.   On: 07/31/2019 19:42      Assessment/Plan Principal Problem:   Seizure [facility]) Active Problems:   Focal epilepsy with impairment of consciousness, intractable ([place])   Bladder cancer ([place])     #1 seizure versus syncope: Patient most likely has had another escape of seizure.  We will admit the patient to observe him with seizure precaution.  Continue his home regimen.  Monitor and follow-up CT.  Patient has a spine stimulator and cannot get an MRI.  We may get neurology involvement.  He has his primary neurologist Dr.  Delice Lesch who can be consulted for inputs.  #2 bladder cancer: Apparently patient has now had recurrence.  He has been status post radical cystectomy with follow-up chemo.  Will consult his oncologist in the morning for any further evaluation.  We will try and obtain results of his CT abdomen and pelvis.  Any further imaging per oncology  #3 severe low back pain: Suspected to be due to metastatic disease.  At this point patient will require pain management pending oncology evaluation.  He may benefit from palliative radiotherapy.   DVT prophylaxis: Lovenox Code Status: Full code Family Communication: Wife at bedside Disposition Plan: To be determined Consults called: Please consult oncology in the morning.  He follows up with Dr. Alen Blew Admission status: Inpatient  Severity of Illness: The appropriate patient status for this patient is INPATIENT. Inpatient status is judged to be reasonable and necessary in order to provide the required intensity of service to ensure the patient's safety. The patient's presenting symptoms, physical exam findings, and initial radiographic and laboratory data in the context of their chronic comorbidities is felt to place them at high risk for further clinical deterioration. Furthermore, it is not anticipated that the patient will be medically stable for discharge from the hospital within 2 midnights of admission. The following factors support the patient status of inpatient.   " The patient's presenting symptoms include seizure with low back pain. " The worrisome physical exam findings include cachexia. " The initial radiographic and laboratory data are worrisome because of mostly within normal. " The chronic co-morbidities include history of bladder cancer.   * I certify that at the point of admission it is my clinical judgment that the patient will require inpatient hospital care spanning beyond 2 midnights from the point of admission due to high intensity of  service, high risk for further deterioration and high frequency of surveillance required.Barbette Merino MD [facility] Pager [phone]  If 7PM-7AM, please contact night-coverage [email or web address] Password [email or web address]  08/01/2019, 12:08 AM

## 2019-08-01 NOTE — Progress Notes (Signed)
I was asked by Dr. Avon Gully to comment on John Holt case. Full note to follow on 76/71. 74 year old I have seen in the past with bladder cancer. Now, has mets to liver and spine. No brain mets noted on CT scan.  I see no urgent oncology issues. I will arrange follow up as outpatient upon discharge. Will discuss with patient on 4/6.

## 2019-08-01 NOTE — Progress Notes (Signed)
PROGRESS NOTE    John Holt  [id number] DOB: Mar 29, 1946 DOA: 07/31/2019 PCP: [name], MD   Brief Narrative:  John Holt is a 74 y.o. male with medical history significant of seizure disorder, bipolar disorder, bladder cancer status post radical cystectomy 2 years ago with chemotherapy performed at [facility], who also follows with neurologist that has been having significant back pain lately followed by 4 episode of seizure last week.  This happened on Thursday and Friday.  Patient was seen by his PCP on Thursday after follow-up with neurology.  He was seen on 3/29 and 4/01 via telephone by the neurologist.  His seizure disorder was thought to be S Seizure due to ongoing low back pain.  Patient is supposed to be on Onfi 5 mg.  He was given that on April 1st.  CT abdomen and pelvis was done as a follow-up and palpation did not get the results by Friday when they came to the ER.  CT results not in our system but reported to the ER on Friday that patient has now metastasis diffusely throughout the abdomen.  This was thought to be the cause of his severe back pain for which he has been to the surgeon as well as his primary care physician in the last 2 weeks.  He was discharged home but returned again today with worsening pain as well as another seizure episode last night.  He is being admitted at the moment for evaluation of seizure versus syncope.  Denied any ongoing fever.  No dysuria.  Patient has had radical cystectomy with ileal conduit.  He has not seen his oncologist since his surgery. In ED: Temperature 98.1 blood pressure 148/78 pulse 85 respiratory rate of 18 oxygen sat 75% on room air currently 100% on 2 L.  White count 9.9 hemoglobin 10.4 and platelets 375. Chemistry entirely within normal except for BUN of 28 and glucose 116.  Urine culture from 2 days ago is growing Serratia marcescens.  Chest CT and head CT without contrast today showed no acute findings.  Patient being  admitted with possible breakthrough seizure with reported metastatic intra-abdominal disease which may be contributing to his ongoing back pain.  Assessment & Plan:   Principal Problem:   Seizure ([place]) Active Problems:   Focal epilepsy with impairment of consciousness, intractable (HCC)   Bladder cancer ([place])   Acute metabolic encephalopathy likely secondary breakthrough seizure - Concerning the patient's recurrent breakthrough seizures over the past 1 to 2 weeks have been in the setting of worsening pain - No clear indication or new medications to reduce patient's seizure threshold - Spoke with patient's primary neurologist Dr. Delice Lesch -recommended increasing patient's Onfi to 10 mg, will not repeat EEG given patient's back to baseline mental status per wife. He did discuss that if patient continues to have waxing and waning episodes of mental status changes would repeat EEG to rule out ongoing subclinical seizures.  Bladder cancer, unspecified:  - Unclear patient's current status, he has had radical cystectomy with follow-up chemo in the past and was told he was "all clear"  - Now there appears to be repeat CT abdomen pelvis in the recent past the patient indicates that shows recurrence of cancer but the CT is unavailable in our system.   - Dr. Alen Blew and I spoke today, he will follow up with the patient and his wife about imaging, future visits and treatment options.   - The meantime palliative care has been consulted to discuss  opportunities for pain management given patient's intractable back pain as below as well as likely occurrence of bladder cancer which would likely indicate poor prognosis.    Intractable severe low back pain:  - Suspected to be due to metastatic disease as above but imaging is unavailable -Have care to follow for pain management, defer to oncology if this truly is secondary to metastatic disease any indication for radiation, chemotherapy or simply palliative care  at this time.  DVT prophylaxis: Lovenox Code Status: Full code Family Communication: Wife over the phone Disposition Plan: To be determined Consults called:  Oncology Dr. Alen Blew updated Admission status: Inpatient  Procedures:   None at this time  Antimicrobials:  None indicated   Subjective: No acute issues or events overnight, patient much more awake alert oriented this morning, denies nausea, vomiting, diarrhea, constipation, headache, fevers, chills.  Continues to complain of lumbar throbbing achy pain appears to be at previous levels.  Objective: Vitals:   08/01/19 0040 08/01/19 0057 08/01/19 0059 08/01/19 0514  BP: 136/79 (!) 157/86 140/82 124/75  Pulse: 60 (!) 55 75 67  Resp: 20 20 20 19   Temp: 97.8 F (36.6 C)   98.5 F (36.9 C)  TempSrc: Oral     SpO2: 98% (!) 86%  98%  Weight:      Height:        Intake/Output Summary (Last 24 hours) at 08/01/2019 0724 Last data filed at 08/01/2019 0314 Gross per 24 hour  Intake 661.51 ml  Output 550 ml  Net 111.51 ml   Filed Weights   07/31/19 1838  Weight: 54.4 kg    Examination:  General:  Pleasantly resting in bed, No acute distress. HEENT:  Normocephalic atraumatic.  Sclerae nonicteric, noninjected.  Extraocular movements intact bilaterally. Neck:  Without mass or deformity.  Trachea is midline. Lungs:  Clear to auscultate bilaterally without rhonchi, wheeze, or rales. Heart:  Regular rate and rhythm.  Without murmurs, rubs, or gallops. Abdomen:  Soft, nontender, nondistended.  Without guarding or rebound. Extremities: Without cyanosis, clubbing, edema, or obvious deformity. Vascular:  Dorsalis pedis and posterior tibial pulses palpable bilaterally. Skin:  Warm and dry, no erythema, no ulcerations.   Data Reviewed: I have personally reviewed following labs and imaging studies  CBC: Recent Labs  Lab 07/29/19 1630 07/31/19 1943 08/01/19 0531  WBC 8.3 9.9 8.4  NEUTROABS 5.9 9.0*  --   HGB 10.9* 10.4*  10.0*  HCT 36.0* 33.9* 32.8*  MCV 84.3 84.3 83.9  PLT 345 375 0000000   Basic Metabolic Panel: Recent Labs  Lab 07/29/19 1630 07/31/19 1943 08/01/19 0531  NA 140 136 138  K 4.2 4.8 4.1  CL 104 103 106  CO2 26 24 25   GLUCOSE 91 116* 101*  BUN 22 28* 23  CREATININE 0.98 0.84 0.78  CALCIUM 9.4 9.5 9.0   GFR: Estimated Creatinine Clearance: 60.4 mL/min (by C-G formula based on SCr of 0.78 mg/dL). Liver Function Tests: Recent Labs  Lab 07/29/19 1630 07/31/19 1943 08/01/19 0531  AST 25 21 18   ALT 24 21 17   ALKPHOS 176* 149* 138*  BILITOT 0.9 0.6 0.3  PROT 6.5 6.5 6.0*  ALBUMIN 3.7 3.5 3.2*   No results for input(s): LIPASE, AMYLASE in the last 168 hours. No results for input(s): AMMONIA in the last 168 hours. Coagulation Profile: No results for input(s): INR, PROTIME in the last 168 hours. Cardiac Enzymes: No results for input(s): CKTOTAL, CKMB, CKMBINDEX, TROPONINI in the last 168 hours. BNP (last 3  results) No results for input(s): PROBNP in the last 8760 hours. HbA1C: No results for input(s): HGBA1C in the last 72 hours. CBG: No results for input(s): GLUCAP in the last 168 hours. Lipid Profile: No results for input(s): CHOL, HDL, LDLCALC, TRIG, CHOLHDL, LDLDIRECT in the last 72 hours. Thyroid Function Tests: No results for input(s): TSH, T4TOTAL, FREET4, T3FREE, THYROIDAB in the last 72 hours. Anemia Panel: No results for input(s): VITAMINB12, FOLATE, FERRITIN, TIBC, IRON, RETICCTPCT in the last 72 hours. Sepsis Labs: No results for input(s): PROCALCITON, LATICACIDVEN in the last 168 hours.  Recent Results (from the past 240 hour(s))  Urine culture     Status: Abnormal (Preliminary result)   Collection Time: 07/29/19  8:07 PM   Specimen: Urine, Random  Result Value Ref Range Status   Specimen Description   Final    URINE, RANDOM Performed at [facility] [address] [place], [place] [address]    Special Requests   Final     NONE Performed at [facility], [place] [address] [place], [place] [address]    Culture >=100,000 COLONIES/mL SERRATIA MARCESCENS (A)  Final   Report Status PENDING  Incomplete    Radiology Studies: CT Head Wo Contrast  Result Date: 07/31/2019 CLINICAL DATA:  Seizure EXAM: CT HEAD WITHOUT CONTRAST TECHNIQUE: Contiguous axial images were obtained from the base of the skull through the vertex without intravenous contrast. COMPARISON:  12/04/2018 FINDINGS: Brain: Mild age related volume loss. No acute intracranial abnormality. Specifically, no hemorrhage, hydrocephalus, mass lesion, acute infarction, or significant intracranial injury. Vascular: No hyperdense vessel or unexpected calcification. Skull: No acute calvarial abnormality. Sinuses/Orbits: Visualized paranasal sinuses and mastoids clear. Orbital soft tissues unremarkable. Other: None IMPRESSION: No acute intracranial abnormality. Electronically Signed   By: Rolm Baptise M.D.   On: 07/31/2019 19:42   Scheduled Meds: . cloBAZam  5 mg Oral QHS  . dexamethasone  4 mg Oral BID WC  . enoxaparin (LOVENOX) injection  40 mg Subcutaneous Daily  . levothyroxine  125 mcg Oral QAC breakfast  . lidocaine-EPINEPHrine      . lidocaine-EPINEPHrine  10 mL Infiltration Once  . mouth rinse  15 mL Mouth Rinse BID  . polyethylene glycol  17 g Oral Daily  . zonisamide  300 mg Oral QHS   Continuous Infusions: . dextrose 5 % and 0.9% NaCl 100 mL/hr at 08/01/19 0122     LOS: 1 day    Time spent: 65 min  Little Ishikawa, DO [facility]  If 7PM-7AM, please contact night-coverage [email or web address]  08/01/2019, 7:24 AM

## 2019-08-01 NOTE — Progress Notes (Addendum)
Initial Nutrition Assessment  DOCUMENTATION CODES:   Underweight  INTERVENTION:  - will order 1400 daily snack. - will order D[employer] [place] once/day, each supplement provides 425 kcal and 20 grams protein. - continue to encourage PO intakes. - will perform NFPE at follow-up.    NUTRITION DIAGNOSIS:   Increased nutrient needs related to acute illness, catabolic illness as evidenced by estimated needs.  GOAL:   Patient will meet greater than or equal to 90% of their needs  MONITOR:   PO intake, Supplement acceptance, Labs, Weight trends  REASON FOR ASSESSMENT:   Malnutrition Screening Tool  ASSESSMENT:   74 y.o. male with medical history significant of seizure disorder, bipolar disorder, bladder cancer s/p radical cystectomy with ileal conduit 2 years ago with chemotherapy performed at [facility]. He has been having significant back pain and he had 4 seizures the week PTA. He was seen by PCP on 4/1 after follow-up with Neurology. Seen via video chat by Neurology on 3/29 and 4/1. CT abd/pelvis done PTA and patient reported he was informed that he has diffuse metastatic disease throughout abdomen. This was thought to be the cause of severe back pain. He was admitted for seizure vs syncope. He has not seen his Oncologist since his surgery 2 years ago. CT chest and CT head at [facility] did not show any acute findings.  Patient consumed 100% of breakfast this AM and lunch tray was delivered shortly after RD entered the room. Patient was laying in bed and his wife was at bedside. Visit was short as patient wanted to eat lunch. Unable to complete NFPE at this time.   Patient reports that his appetite has been very good but he continues to lose weight. He does indicate that portion sizes when he eats have been smaller lately. His wife feels that he has been eating better recently than he had been eating several weeks ago. Unable to obtain any further information at this time.  Encouraged  patient to eat as well as he can at meals, to consume snacks or items such as smoothies (which he enjoys) in between meals and to have protein each time he eats.   Per chart review, weight yesterday was 120 lb, weight on 2/5 was 121 lb, and weight on 02/23/19 was 133 lb. This indicates 13 lb weight loss (9.8% body weight) in the past 5.5 months; significant for time frame.  Highly suspect some degree of malnutrition, but unable to confirm at this time.   Oncology is now following and plans for full note to be entered on 4/6.   Labs reviewed; alk phos elevated. Medications reviewed; 125 mcg oral synthroid/day, 1 packet miralax/day.  IVF; D5-NS @ 100 ml/hr (408 kcal).      NUTRITION - FOCUSED PHYSICAL EXAM:  unable to complete at this time.   Diet Order:   Diet Order            Diet regular Room service appropriate? Yes; Fluid consistency: Thin  Diet effective now              EDUCATION NEEDS:   No education needs have been identified at this time  Skin:  Skin Assessment: Skin Integrity Issues: Skin Integrity Issues:: Stage I Stage I: bilateral buttocks  Last BM:  PTA/unknown  Height:   Ht Readings from Last 1 Encounters:  07/31/19 5' 8"  (1.727 m)    Weight:   Wt Readings from Last 1 Encounters:  07/31/19 54.4 kg    Ideal Body Weight:  70 kg  BMI:  Body mass index is 18.25 kg/m.  Estimated Nutritional Needs:   Kcal:  1795-1960 kcal  Protein:  90-100 grams  Fluid:  >/= 2 L/day     John Matin, MS, RD, LDN, CNSC Inpatient Clinical Dietitian RD pager # available in AMION  After hours/weekend pager # available in [facility]

## 2019-08-02 ENCOUNTER — Telehealth: Payer: Self-pay | Admitting: Emergency Medicine

## 2019-08-02 DIAGNOSIS — T402X5A Adverse effect of other opioids, initial encounter: Secondary | ICD-10-CM | POA: Diagnosis not present

## 2019-08-02 DIAGNOSIS — Z7189 Other specified counseling: Secondary | ICD-10-CM

## 2019-08-02 DIAGNOSIS — G893 Neoplasm related pain (acute) (chronic): Secondary | ICD-10-CM | POA: Diagnosis not present

## 2019-08-02 DIAGNOSIS — Z515 Encounter for palliative care: Secondary | ICD-10-CM

## 2019-08-02 DIAGNOSIS — C679 Malignant neoplasm of bladder, unspecified: Secondary | ICD-10-CM

## 2019-08-02 DIAGNOSIS — R569 Unspecified convulsions: Secondary | ICD-10-CM | POA: Diagnosis not present

## 2019-08-02 DIAGNOSIS — K5903 Drug induced constipation: Secondary | ICD-10-CM

## 2019-08-02 MED ORDER — SENNA 8.6 MG PO TABS: 2.0000 | ORAL_TABLET | Freq: Two times a day (BID) | ORAL | 0 refills | Status: AC

## 2019-08-02 MED ORDER — CLOBAZAM 10 MG PO TABS: 10.0000 mg | ORAL_TABLET | Freq: Every day | ORAL | 0 refills | Status: DC

## 2019-08-02 MED ORDER — SENNA 8.6 MG PO TABS
2.0000 | ORAL_TABLET | Freq: Two times a day (BID) | ORAL | Status: DC
  Administered 2019-08-02: 17.2 mg via ORAL
  Filled 2019-08-02: qty 2

## 2019-08-02 MED ORDER — MORPHINE SULFATE 15 MG PO TABS: 15.0000 mg | ORAL_TABLET | ORAL | 0 refills | Status: AC | PRN

## 2019-08-02 MED ORDER — BISACODYL 10 MG RE SUPP: 10.0000 mg | Freq: Once | RECTAL | Status: DC

## 2019-08-02 NOTE — Discharge Summary (Signed)
Physician Discharge Summary  John Holt [id number] DOB: 10-Sep-1945 DOA: 07/31/2019  PCP: [name], MD  Admit date: 07/31/2019 Discharge date: 08/02/2019  Admitted From: Home Disposition: Home  Recommendations for Outpatient Follow-up:  1. Follow up with PCP in 1-2 weeks 2. Please obtain BMP/CBC in one week  Discharge Condition: Stable CODE STATUS: Full Diet recommendation: As tolerated  Brief/Interim Summary: John Holt a 74 y.o.malewith medical history significant ofseizure disorder, bipolar disorder, bladder cancer status post radical cystectomy 2 years ago with chemotherapy performed at [facility], who also follows with neurologist that has been having significant back pain lately followed by 4 episode of seizure last week. This happened on Thursday and Friday. Patient was seen by his PCP on Thursday after follow-up with neurology. He was seen on 3/29 and 4/01 via telephone by the neurologist. His seizure disorder was thought to be S Seizure due to ongoing low back pain. Patient is supposed to be on Onfi5 mg. He was given that on April 1st. CT abdomen and pelvis was done as a follow-up and palpation did not get the results by Friday when they came to the ER. CT results not in our system but reported to the ER on Friday that patient has now metastasis diffusely throughout the abdomen. This was thought to be the cause of his severe back pain for which he has been to the surgeon as well as his primary care physician in the last 2 weeks. He was discharged home but returned again today with worsening pain as well as another seizure episode last night. He is being admitted at the moment for evaluation of seizure versus syncope. Denied any ongoing fever. No dysuria. Patient has had radical cystectomy with ileal conduit. He has not seen his oncologist since his surgery. In ED: Temperature 98.1 blood pressure 148/78 pulse 85 respiratory rate of 18 oxygen sat 75%  on room air currently 100% on 2 L. White count 9.9 hemoglobin 10.4 and platelets 375. Chemistry entirely within normal except for BUN of 28 and glucose 116. Urine culture from 2 days ago is growing Serratia marcescens. Chest CT and head CT without contrast today showed no acute findings. Patient being admitted with possible breakthrough seizure with reported metastatic intra-abdominal disease which may be contributing to his ongoing back pain.  Patient admitted as above with intractable back pain in the setting of likely metastatic disease, concern for breakthrough seizure given mental status changes.  Patient's primary neurologist was consulted, we increase patient's Onfi back to 10 mg nightly.  Patient's back pain is now controlled well on morphine 15mg  will be discharged on 5-day course with close follow-up with Dr. Alen Blew oncology as well as with palliative care given their discussion today that he would likely be candidate for palliative care and palliative treatment only with likely chemotherapy versus radiation, they have an outpatient appointment scheduled for close follow-up discussion about his case.  Patient otherwise appears quite well, wife at bedside indicates patient has not looked as comfortable "a while" and otherwise they are agreeable for discharge home with close outpatient follow-up as above.  [name]  Discharge Diagnoses:  Principal Problem:   Seizure [facility]) Active Problems:   Focal epilepsy with impairment of consciousness, intractable ([place])   Bladder cancer ([place])   Pressure injury of skin    Discharge Instructions  Discharge Instructions    Call MD for:  difficulty breathing, headache or visual disturbances   Complete by: As directed    Call MD for:  extreme fatigue   Complete by: As directed    Call MD for:  hives   Complete by: As directed    Call MD for:  persistant dizziness or light-headedness   Complete by: As directed    Call MD for:  persistant nausea and  vomiting   Complete by: As directed    Call MD for:  severe uncontrolled pain   Complete by: As directed    Call MD for:  temperature >100.4   Complete by: As directed    Diet - low sodium heart healthy   Complete by: As directed    Increase activity slowly   Complete by: As directed      Allergies as of 08/02/2019      Reactions   Chlorhexidine Rash   Full body   Povidone-iodine Rash      Medication List    TAKE these medications   cloBAZam 10 MG tablet Commonly known as: ONFI Take 1 tablet (10 mg total) by mouth at bedtime. What changed: how much to take   clonazePAM 0.5 MG tablet Commonly known as: KLONOPIN Take 0.5 mg by mouth 2 (two) times daily as needed (For seizures per wife).   dexamethasone 4 MG tablet Commonly known as: DECADRON Take 1 tablet (4 mg total) by mouth 2 (two) times daily with a meal.   levothyroxine 125 MCG tablet Commonly known as: SYNTHROID Take 125 mcg by mouth daily before breakfast.   loperamide 2 MG tablet Commonly known as: IMODIUM A-D Take 2 mg by mouth 4 (four) times daily as needed for diarrhea or loose stools.   morphine 15 MG tablet Commonly known as: MSIR Take 1 tablet (15 mg total) by mouth every 4 (four) hours as needed for up to 5 days for severe pain.   multivitamin with minerals Tabs tablet Take 1 tablet by mouth in the morning and at bedtime.   ondansetron 4 MG disintegrating tablet Commonly known as: Zofran ODT 4mg  ODT q4 hours prn nausea/vomit   polyethylene glycol 17 g packet Commonly known as: MIRALAX / GLYCOLAX Take 17 g by mouth daily. What changed:   when to take this  reasons to take this   zonisamide 100 MG capsule Commonly known as: ZONEGRAN TAKE 3 CAPSULES EACH NIGHT What changed:   how much to take  how to take this  when to take this  additional instructions       Allergies  Allergen Reactions  . Chlorhexidine Rash    Full body  . Povidone-Iodine Rash     Consultations:  Neurology Dr. Delice Lesch  Oncology Dr. Alen Blew   Procedures/Studies: DG Chest 2 View  Result Date: 07/28/2019 CLINICAL DATA:  Bladder cancer, previous tobacco abuse EXAM: CHEST - 2 VIEW COMPARISON:  04/12/2013 FINDINGS: Frontal and lateral views of the chest demonstrate right chest wall port via internal jugular approach tip in the region the atrial caval junction. Nerve stimulator generator overlies left chest, lead in the left supraclavicular region. Cardiac silhouette is stable. There is a small hiatal hernia. No airspace disease, effusion, or pneumothorax. No acute bony abnormalities. IMPRESSION: 1. No acute intrathoracic process. Electronically Signed   By: Randa Ngo M.D.   On: 07/28/2019 19:56   CT Head Wo Contrast  Result Date: 07/31/2019 CLINICAL DATA:  Seizure EXAM: CT HEAD WITHOUT CONTRAST TECHNIQUE: Contiguous axial images were obtained from the base of the skull through the vertex without intravenous contrast. COMPARISON:  12/04/2018 FINDINGS: Brain: Mild age related volume loss. No  acute intracranial abnormality. Specifically, no hemorrhage, hydrocephalus, mass lesion, acute infarction, or significant intracranial injury. Vascular: No hyperdense vessel or unexpected calcification. Skull: No acute calvarial abnormality. Sinuses/Orbits: Visualized paranasal sinuses and mastoids clear. Orbital soft tissues unremarkable. Other: None IMPRESSION: No acute intracranial abnormality. Electronically Signed   By: Rolm Baptise M.D.   On: 07/31/2019 19:42    Subjective: Issues or events overnight, tolerated increased dose of Onfi quite well, pain well controlled, denies nausea, vomiting, diarrhea, constipation, headache, fevers, chills.   Discharge Exam: Vitals:   08/01/19 2033 08/02/19 0511  BP: 115/70 (!) 142/85  Pulse: 75 72  Resp: 18 18  Temp: 98.1 F (36.7 C) 98 F (36.7 C)  SpO2: 97% 100%   Vitals:   08/01/19 0901 08/01/19 1316 08/01/19 2033 08/02/19 0511   BP: 123/76 112/64 115/70 (!) 142/85  Pulse: 68 67 75 72  Resp: 20 15 18 18   Temp: 98 F (36.7 C) 98.5 F (36.9 C) 98.1 F (36.7 C) 98 F (36.7 C)  TempSrc: Oral Oral Oral Oral  SpO2: 98% 95% 97% 100%  Weight:      Height:        General:  Pleasantly resting in bed, No acute distress. HEENT:  Normocephalic atraumatic.  Sclerae nonicteric, noninjected.  Extraocular movements intact bilaterally. Neck:  Without mass or deformity.  Trachea is midline. Lungs:  Clear to auscultate bilaterally without rhonchi, wheeze, or rales. Heart:  Regular rate and rhythm.  Without murmurs, rubs, or gallops. Abdomen:  Soft, nontender, nondistended.  Without guarding or rebound. Extremities: Without cyanosis, clubbing, edema, or obvious deformity. Vascular:  Dorsalis pedis and posterior tibial pulses palpable bilaterally. Skin:  Warm and dry, no erythema, no ulcerations.   The results of significant diagnostics from this hospitalization (including imaging, microbiology, ancillary and laboratory) are listed below for reference.     Microbiology: Recent Results (from the past 240 hour(s))  Urine culture     Status: Abnormal   Collection Time: 07/29/19  8:07 PM   Specimen: Urine, Random  Result Value Ref Range Status   Specimen Description   Final    URINE, RANDOM Performed at [facility] [address] [place], [place] [address]    Special Requests   Final    NONE Performed at [facility], [place] [address], [place], [place] [address]    Culture >=100,000 COLONIES/mL SERRATIA MARCESCENS (A)  Final   Report Status 08/01/2019 FINAL  Final   Organism ID, Bacteria SERRATIA MARCESCENS (A)  Final      Susceptibility   Serratia marcescens - MIC*    CEFAZOLIN >=64 RESISTANT Resistant     CEFTRIAXONE 32 RESISTANT Resistant     CIPROFLOXACIN <=0.25 SENSITIVE Sensitive     GENTAMICIN <=1 SENSITIVE Sensitive     NITROFURANTOIN 128 RESISTANT Resistant      TRIMETH/SULFA <=20 SENSITIVE Sensitive     * >=100,000 COLONIES/mL SERRATIA MARCESCENS  SARS CORONAVIRUS 2 (TAT 6-24 HRS) Nasopharyngeal Nasopharyngeal Swab     Status: None   Collection Time: 07/31/19 11:19 PM   Specimen: Nasopharyngeal Swab  Result Value Ref Range Status   SARS Coronavirus 2 NEGATIVE NEGATIVE Final    Comment: (NOTE) SARS-CoV-2 target nucleic acids are NOT DETECTED. The SARS-CoV-2 RNA is generally detectable in upper and lower respiratory specimens during the acute phase of infection. Negative results do not preclude SARS-CoV-2 infection, do not rule out co-infections with other pathogens, and should not be used as the sole basis for treatment  or other patient management decisions. Negative results must be combined with clinical observations, patient history, and epidemiological information. The expected result is Negative. Fact Sheet for Patients: SugarRoll.be Fact Sheet for Healthcare Providers: https://www.woods-mathews.com/ This test is not yet approved or cleared by the [place] FDA and  has been authorized for detection and/or diagnosis of SARS-CoV-2 by FDA under an Emergency Use Authorization (EUA). This EUA will remain  in effect (meaning this test can be used) for the duration of the COVID-19 declaration under Section 56 4(b)(1) of the Act, 21 U.S.C. section 360bbb-3(b)(1), unless the authorization is terminated or revoked sooner. Performed at [facility], [place] [address], [place], [place] [address]      Labs: BNP (last 3 results) No results for input(s): BNP in the last 8760 hours. Basic Metabolic Panel: Recent Labs  Lab 07/29/19 1630 07/31/19 1943 08/01/19 0531  NA 140 136 138  K 4.2 4.8 4.1  CL 104 103 106  CO2 26 24 25   GLUCOSE 91 116* 101*  BUN 22 28* 23  CREATININE 0.98 0.84 0.78  CALCIUM 9.4 9.5 9.0   Liver Function Tests: Recent Labs  Lab 07/29/19 1630 07/31/19 1943  08/01/19 0531  AST 25 21 18   ALT 24 21 17   ALKPHOS 176* 149* 138*  BILITOT 0.9 0.6 0.3  PROT 6.5 6.5 6.0*  ALBUMIN 3.7 3.5 3.2*   No results for input(s): LIPASE, AMYLASE in the last 168 hours. No results for input(s): AMMONIA in the last 168 hours. CBC: Recent Labs  Lab 07/29/19 1630 07/31/19 1943 08/01/19 0531  WBC 8.3 9.9 8.4  NEUTROABS 5.9 9.0*  --   HGB 10.9* 10.4* 10.0*  HCT 36.0* 33.9* 32.8*  MCV 84.3 84.3 83.9  PLT 345 375 391   Cardiac Enzymes: No results for input(s): CKTOTAL, CKMB, CKMBINDEX, TROPONINI in the last 168 hours. BNP: Invalid input(s): POCBNP CBG: No results for input(s): GLUCAP in the last 168 hours. D-Dimer No results for input(s): DDIMER in the last 72 hours. Hgb A1c No results for input(s): HGBA1C in the last 72 hours. Lipid Profile No results for input(s): CHOL, HDL, LDLCALC, TRIG, CHOLHDL, LDLDIRECT in the last 72 hours. Thyroid function studies No results for input(s): TSH, T4TOTAL, T3FREE, THYROIDAB in the last 72 hours.  Invalid input(s): FREET3 Anemia work up No results for input(s): VITAMINB12, FOLATE, FERRITIN, TIBC, IRON, RETICCTPCT in the last 72 hours. Urinalysis    Component Value Date/Time   COLORURINE YELLOW 07/29/2019 1706   APPEARANCEUR CLEAR 07/29/2019 1706   LABSPEC 1.005 07/29/2019 1706   PHURINE 7.0 07/29/2019 1706   GLUCOSEU NEGATIVE 07/29/2019 1706   HGBUR NEGATIVE 07/29/2019 1706   BILIRUBINUR NEGATIVE 07/29/2019 1706   KETONESUR NEGATIVE 07/29/2019 1706   PROTEINUR NEGATIVE 07/29/2019 1706   UROBILINOGEN 0.2 06/11/2010 1135   NITRITE NEGATIVE 07/29/2019 1706   LEUKOCYTESUR LARGE (A) 07/29/2019 1706   Sepsis Labs Invalid input(s): PROCALCITONIN,  WBC,  LACTICIDVEN Microbiology Recent Results (from the past 240 hour(s))  Urine culture     Status: Abnormal   Collection Time: 07/29/19  8:07 PM   Specimen: Urine, Random  Result Value Ref Range Status   Specimen Description   Final    URINE,  RANDOM Performed at [facility], [place] [address] [place], [place] [address]    Special Requests   Final    NONE Performed at [facility], [place] [address], [place], [place] [address]    Culture >=100,000 COLONIES/mL SERRATIA MARCESCENS (A)  Final   Report Status 08/01/2019 FINAL  Final   Organism ID, Bacteria SERRATIA MARCESCENS (A)  Final      Susceptibility   Serratia marcescens - MIC*    CEFAZOLIN >=64 RESISTANT Resistant     CEFTRIAXONE 32 RESISTANT Resistant     CIPROFLOXACIN <=0.25 SENSITIVE Sensitive     GENTAMICIN <=1 SENSITIVE Sensitive     NITROFURANTOIN 128 RESISTANT Resistant     TRIMETH/SULFA <=20 SENSITIVE Sensitive     * >=100,000 COLONIES/mL SERRATIA MARCESCENS  SARS CORONAVIRUS 2 (TAT 6-24 HRS) Nasopharyngeal Nasopharyngeal Swab     Status: None   Collection Time: 07/31/19 11:19 PM   Specimen: Nasopharyngeal Swab  Result Value Ref Range Status   SARS Coronavirus 2 NEGATIVE NEGATIVE Final    Comment: (NOTE) SARS-CoV-2 target nucleic acids are NOT DETECTED. The SARS-CoV-2 RNA is generally detectable in upper and lower respiratory specimens during the acute phase of infection. Negative results do not preclude SARS-CoV-2 infection, do not rule out co-infections with other pathogens, and should not be used as the sole basis for treatment or other patient management decisions. Negative results must be combined with clinical observations, patient history, and epidemiological information. The expected result is Negative. Fact Sheet for Patients: SugarRoll.be Fact Sheet for Healthcare Providers: https://www.woods-mathews.com/ This test is not yet approved or cleared by the [place] FDA and  has been authorized for detection and/or diagnosis of SARS-CoV-2 by FDA under an Emergency Use Authorization (EUA). This EUA will remain  in effect (meaning this test can be used) for the duration of  the COVID-19 declaration under Section 56 4(b)(1) of the Act, 21 U.S.C. section 360bbb-3(b)(1), unless the authorization is terminated or revoked sooner. Performed at [facility], [place] [address], [place], [place] [address]      Time coordinating discharge: Over 30 minutes  SIGNED:  Little Ishikawa, DO [facility] 08/02/2019, 12:00 PM Pager   If 7PM-7AM, please contact night-coverage [email or web address]

## 2019-08-02 NOTE — Telephone Encounter (Signed)
Post ED Visit - Positive Culture Follow-up  Culture report reviewed by antimicrobial stewardship pharmacist: [place] Team []  Elenor Quinones, Pharm.D. []  Heide Guile, Pharm.D., BCPS AQ-ID []  Parks Neptune, Pharm.D., BCPS []  Alycia Rossetti, Pharm.D., BCPS []  [place], [place].D., BCPS, AAHIVP []  Legrand Como, Pharm.D., BCPS, AAHIVP []  Salome Arnt, PharmD, BCPS []  Johnnette Gourd, PharmD, BCPS []  Hughes Better, PharmD, BCPS []  [name], PharmD []  Laqueta Linden, PharmD, BCPS []  Albertina Parr, PharmD  [place] Team []  Leodis Sias, PharmD []  Lindell Spar, PharmD [x]  Royetta Asal, PharmD []  Graylin Shiver, Rph []  Rema Fendt) Glennon Mac, PharmD []  Arlyn Dunning, PharmD []  Netta Cedars, PharmD []  Dia Sitter, PharmD []  Leone Haven, PharmD []  Gretta Arab, PharmD []  Theodis Shove, PharmD []  Peggyann Juba, PharmD []  Reuel Boom, PharmD   Positive urine culture Patient is currently a inpatient @ [facility] and being treated, no further patient follow-up is required at this time.  Hazle Nordmann 08/02/2019, 1:39 PM

## 2019-08-02 NOTE — Progress Notes (Signed)
Went over discharge instructions w/ pt and wife. They verbalized understanding.

## 2019-08-02 NOTE — Progress Notes (Signed)
IP PROGRESS NOTE  Subjective:   [name] feels better this morning with improvement in his pain.  He reported more than 7 hours since his last morphine.  He denies any nausea, vomiting or abdominal pain.  He denies any fevers or chills.  His appetite has declined overall however.  Objective:  Vital signs in last 24 hours: Temp:  [98 F (36.7 C)-98.5 F (36.9 C)] 98 F (36.7 C) (04/06 0511) Pulse Rate:  [67-75] 72 (04/06 0511) Resp:  [15-20] 18 (04/06 0511) BP: (112-142)/(64-85) 142/85 (04/06 0511) SpO2:  [95 %-100 %] 100 % (04/06 0511) Weight change:  Last BM Date: (pta)  Intake/Output from previous day: 04/05 0701 - 04/06 0700 In: 250 [P.O.:250] Out: 1750 [Urine:1750] General: Alert, awake without distress. Head: Normocephalic atraumatic. Mouth: mucous membranes moist, pharynx normal without lesions Eyes: No scleral icterus.  Pupils are equal and round reactive to light. Resp: clear to auscultation bilaterally without rhonchi or wheezes or dullness to percussion. Cardio: regular rate and rhythm, S1, S2 normal, no murmur, click, rub or gallop GI: soft, non-tender; bowel sounds normal; no masses,  no organomegaly Musculoskeletal: No joint deformity or effusion. Neurological: No motor, sensory deficits.  Intact deep tendon reflexes. Skin: No rashes or lesions.  Portacath remains in place without any issues.  Lab Results: Recent Labs    07/31/19 1943 08/01/19 0531  WBC 9.9 8.4  HGB 10.4* 10.0*  HCT 33.9* 32.8*  PLT 375 391    BMET Recent Labs    07/31/19 1943 08/01/19 0531  NA 136 138  K 4.8 4.1  CL 103 106  CO2 24 25  GLUCOSE 116* 101*  BUN 28* 23  CREATININE 0.84 0.78  CALCIUM 9.5 9.0    Studies/Results: CT Head Wo Contrast  Result Date: 07/31/2019 CLINICAL DATA:  Seizure EXAM: CT HEAD WITHOUT CONTRAST TECHNIQUE: Contiguous axial images were obtained from the base of the skull through the vertex without intravenous contrast. COMPARISON:  12/04/2018  FINDINGS: Brain: Mild age related volume loss. No acute intracranial abnormality. Specifically, no hemorrhage, hydrocephalus, mass lesion, acute infarction, or significant intracranial injury. Vascular: No hyperdense vessel or unexpected calcification. Skull: No acute calvarial abnormality. Sinuses/Orbits: Visualized paranasal sinuses and mastoids clear. Orbital soft tissues unremarkable. Other: None IMPRESSION: No acute intracranial abnormality. Electronically Signed   By: Rolm Baptise M.D.   On: 07/31/2019 19:42    Medications: I have reviewed the patient's current medications.  Assessment/Plan:  74 year old man with:  1.  Bladder cancer diagnosed in 2018.  At that time he received neoadjuvant chemotherapy under the care of Dr. Marcello Moores at [facility] and did not receive radical cystectomy at that time.  He developed recurrent disease in 2020 and underwent radical cystectomy by Dr. Tresa Moore on October 30.  CT scan obtained on July 28, 2019 under care of Dr. Tresa Moore showed recurrent disease with spinal metastasis as well as hepatic metastasis.  He was hospitalized on April 4 with increased back pain and increase syncopal episodes.  The natural course of this disease was reviewed today with the patient in detail.  He understands his disease is incurable at this time and any treatment would be palliative.  Systemic therapy would be an option at this time utilizing chemotherapy or immunotherapy.  The goal would be to decrease tumor volume and improve his pain.  He is not objecting to chemotherapy and principal.  He values quality of life quantity and would be open to it if it improves his quality  of life.  Plan is to arrange for outpatient follow-up upon his discharge to discuss this further.  2.  Back pain: He is on a Decadron 4 mg twice a day with morphine 15 mg immediate release as needed.  Pain is manageable at this time. Will consider palliative radiation therapy to the spine if  needed in the future.  3.  Prognosis: He has aggressive disease with rather rapid metastasis since his surgery in October 2020.  This makes his disease carry a overall poor prognosis.  His performance status remains adequate and still desires aggressive measures.  He also understands if he could not tolerate chemotherapy hospice would be his best option.   4.  Disposition: We will arrange oncology follow-up upon his discharge.  30  minutes were dedicated to this visit.  More than 50% of the time was spent face-to-face and was dedicated to reviewing imaging studies, disease status, treatment options and future plan of care discussion.     LOS: 2 days   Zola Button 08/02/2019, 7:31 AM

## 2019-08-03 DIAGNOSIS — K5900 Constipation, unspecified: Secondary | ICD-10-CM | POA: Diagnosis not present

## 2019-08-03 NOTE — Consult Note (Signed)
Palliative Care Consult Note  Reason for Consult: Pain management related to metastatic disease  Palliative Care Consult Received. Chart reviewed including personal review of pertinent labs and imaging.  Discussed with bedside care team as well as John Holt.  Briefly, John Holt is a 74 year old male with past medical history of seizure disorder, bipolar disorder, bladder cancer status post radical cystectomy 2 years ago with chemotherapy who presented with worsening back pain and increased seizure activity and was found to have recurrent disease with spinal and hepatic mets on repeat CT imaging.  I was asked to see John Holt in consultation for pain management and initiation of conversations regarding goals of care.  I introduced palliative care as specialized medical care for people living with serious illness. It focuses on providing relief from the symptoms and stress of a serious illness. The goal is to improve quality of life for both the patient and the family.  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  He is married and reports his wife would service his surrogate decision maker if he cannot make his own decisions.  He tells me that his goal is to live as well as possible for as long as possible, however, he also indicates that he would not want to prolong his life if it was not something that he can enjoy time with his wife and family.  We discussed his pain.  He reports that it is predominantly in his back and wraps around to his abdomen on either side.  It increases intensity with certain movements and is partially relieved with pain medications.  He reports that it is stabbing with burning sensation when it radiates.  It certainly feels better since he has been admitted to the hospital and started on steroids.  We also had initial discussions regarding advanced care planning.  We reviewed the fact  that he has incurable illness and the goal moving forward is to add as much time in quality to his life.  We discussed improving and maintaining functional status being key to doing this and how aggressive symptom management can benefit him in this as well.  We reviewed a MOST form and discussed options for care moving forward, including consideration for limitations in care that are not likely to allow him to achieve his goal of being well enough to be out of the hospital and spending time with family.  He reports understanding need for further conversation, but he needs to discuss further with his wife prior to making any decisions.  -Pain: Cancer related.  Agree with continuation of steroids.  I also discussed recommendation for scheduling Tylenol if his pain does become worse again.  Would also recommend continuation of currently ordered as needed morphine for breakthrough pain.  If it appears that his needs continue to increase, could certainly consider adding on a long acting agent as well.  I also agree that consideration in the future for possible palliative radiation would be in his best interest if pain recurs and is amenable to ideation therapy. -Constipation: Opioid related.  Continue MiraLAX.  Recommend addition of senna 2 tabs twice daily.  He reports that his constipation is pretty severe today.  We will plan for suppository. -Goals of care: Plan for follow-up with John Holt as an outpatient to further discuss options for care moving forward.  I provided a copy of a MOST form as well as  hard choices for loving People for him to review with his wife.  Time in: 1100 Time out: 1225 Total time: 85 minutes  Greater than 50%  of this time was spent counseling and coordinating care related to the above assessment and plan.  Micheline Rough, MD [place] [facility] [phone]

## 2019-08-04 ENCOUNTER — Inpatient Hospital Stay: Payer: Medicare PPO | Attending: Oncology | Admitting: Oncology

## 2019-08-04 DIAGNOSIS — C7951 Secondary malignant neoplasm of bone: Secondary | ICD-10-CM | POA: Insufficient documentation

## 2019-08-04 DIAGNOSIS — Z5111 Encounter for antineoplastic chemotherapy: Secondary | ICD-10-CM | POA: Insufficient documentation

## 2019-08-04 DIAGNOSIS — Z7189 Other specified counseling: Secondary | ICD-10-CM | POA: Diagnosis not present

## 2019-08-04 DIAGNOSIS — Z452 Encounter for adjustment and management of vascular access device: Secondary | ICD-10-CM | POA: Insufficient documentation

## 2019-08-04 DIAGNOSIS — C674 Malignant neoplasm of posterior wall of bladder: Secondary | ICD-10-CM | POA: Diagnosis not present

## 2019-08-04 DIAGNOSIS — C679 Malignant neoplasm of bladder, unspecified: Secondary | ICD-10-CM | POA: Insufficient documentation

## 2019-08-04 MED ORDER — PROCHLORPERAZINE MALEATE 10 MG PO TABS: 10.0000 mg | ORAL_TABLET | Freq: Four times a day (QID) | ORAL | 0 refills | Status: AC | PRN

## 2019-08-04 NOTE — Progress Notes (Signed)
START ON PATHWAY REGIMEN - Bladder     A cycle is every 21 days:     Carboplatin      Gemcitabine   **Always confirm dose/schedule in your pharmacy ordering system**  Patient Characteristics: Advanced/Metastatic Disease, First Line, Prior Platinum-Based Therapy, Relapse > 12 Months Therapeutic Status: Advanced/Metastatic Disease Line of Therapy: First Line Prior Platinum-Based Therapy<= Yes Time to Relapse: Relapse > 12 Months Intent of Therapy: Non-Curative / Palliative Intent, Discussed with Patient

## 2019-08-04 NOTE — Progress Notes (Signed)
Hematology and Oncology Follow Up for Telemedicine Visits  MONTRAY BOCCUZZI [id number] 1945-11-30 74 y.o. 08/04/2019 3:42 PM Avva, Steva Ready, Marcellina Millin, MD   I connected with Mr. Hagner on 08/04/19 at  3:45 PM EDT by video enabled telemedicine visit and verified that I am speaking with the correct person using two identifiers.  The encounter was changed to phone visit because of technical difficulties.   I discussed the limitations, risks, security and privacy concerns of performing an evaluation and management service by telemedicine and the availability of in-person appointments. I also discussed with the patient that there may be a patient responsible charge related to this service. The patient expressed understanding and agreed to proceed.  Other persons participating in the visit and their role in the encounter: His wife [name]  Patient's location: Home Provider's location: Office    Principle Diagnosis: 74 year old man with stage IV bladder cancer confirmed in April 2020.  He was initially diagnosed in 2018.     Prior Therapy:  He is S/P  neoadjuvant chemotherapy in preparation for radical cystectomy utilizing MVAC for 4 cycles under the care of Dr. Marcello Moores at [facility].  He subsequently declined cystectomy for personal reasons.    He remained disease free till January 2020 where he has recurrence of non-muscle invasive disease.  He was started on Pembrolizumab in March 2020 after declining cystectomy.    Repeat TURBT in April 2020 showed persistent high-grade noninvasive papillary urothelial carcinoma.  He underwent BCG treatment in May 2020 under the care of Dr. Alyson Ingles.  Repeat TURBT in August 2020 showed muscle invasive disease without any evidence of metastatic disease based on imaging studies in June 2020.  He is status post radical cystectomy completed on February 25, 2019 with T3a N0 disease.   Current therapy: Under consideration for  palliative chemotherapy.  Interim History: Mr. Lycett reports better since his discharge with pain is manageable.  He is currently using morphine and Decadron twice a day.  He denies any seizure activity at this time.  Denies any recent shortness of breath or difficulty breathing.     Medications: Reviewed without changes.  Current Outpatient Medications  Medication Sig Dispense Refill  . cloBAZam (ONFI) 10 MG tablet Take 1 tablet (10 mg total) by mouth at bedtime. 30 tablet 0  . clonazePAM (KLONOPIN) 0.5 MG tablet Take 0.5 mg by mouth 2 (two) times daily as needed (For seizures per wife).     [name] dexamethasone (DECADRON) 4 MG tablet Take 1 tablet (4 mg total) by mouth 2 (two) times daily with a meal. 30 tablet 0  . levothyroxine (SYNTHROID) 125 MCG tablet Take 125 mcg by mouth daily before breakfast.    . loperamide (IMODIUM A-D) 2 MG tablet Take 2 mg by mouth 4 (four) times daily as needed for diarrhea or loose stools.    [name] morphine (MSIR) 15 MG tablet Take 1 tablet (15 mg total) by mouth every 4 (four) hours as needed for up to 5 days for severe pain. 30 tablet 0  . Multiple Vitamin (MULTIVITAMIN WITH MINERALS) TABS tablet Take 1 tablet by mouth in the morning and at bedtime.     . ondansetron (ZOFRAN ODT) 4 MG disintegrating tablet 4mg  ODT q4 hours prn nausea/vomit 10 tablet 0  . polyethylene glycol (MIRALAX / GLYCOLAX) 17 g packet Take 17 g by mouth daily. (Patient taking differently: Take 17 g by mouth daily as needed for moderate constipation. ) 14 each 0  .  senna (SENOKOT) 8.6 MG TABS tablet Take 2 tablets (17.2 mg total) by mouth 2 (two) times daily. 120 tablet 0  . zonisamide (ZONEGRAN) 100 MG capsule TAKE 3 CAPSULES EACH NIGHT (Patient taking differently: Take 300 mg by mouth at bedtime. ) 270 capsule 3   No current facility-administered medications for this visit.     Allergies:  Allergies  Allergen Reactions  . Chlorhexidine Rash    Full body  . Povidone-Iodine Rash         Lab Results: Lab Results  Component Value Date   WBC 8.4 08/01/2019   HGB 10.0 (L) 08/01/2019   HCT 32.8 (L) 08/01/2019   MCV 83.9 08/01/2019   PLT 391 08/01/2019     Chemistry      Component Value Date/Time   NA 138 08/01/2019 0531   K 4.1 08/01/2019 0531   CL 106 08/01/2019 0531   CO2 25 08/01/2019 0531   BUN 23 08/01/2019 0531   CREATININE 0.78 08/01/2019 0531      Component Value Date/Time   CALCIUM 9.0 08/01/2019 0531   ALKPHOS 138 (H) 08/01/2019 0531   AST 18 08/01/2019 0531   ALT 17 08/01/2019 0531   BILITOT 0.3 08/01/2019 0531       Impression and Plan:  74 year old man with:  1.  Stage IV bladder cancer confirmed in April 2021.  He was diagnosed initially in 2018.  The natural course of this disease was reviewed today and treatment options were reiterated.  Any treatment at this time is palliative and this disease cannot be cured at this time.  Treatment options would include systemic chemotherapy utilizing platinum and gemcitabine versus immunotherapy.  Given his recent exposure to immunotherapy I feel that with treatment with platinum therapy would be reasonable given his bulk of disease.  Complication associated with this therapy include nausea vomiting, myelosuppression, neutropenia and sepsis were reviewed.  After discussion he is agreeable to proceed.  2.  IV access: Port-A-Cath is in place and will be used for this indication.  3.  Antiemetics: Prescription for antiemetics will be available to him.   4.  Prognosis and goals of care: Therapy is palliative at this time.  This prognosis is guarded but his performance status remains adequate to start systemic therapy.  He is interested in palliative care services to be involved we also discussed about the role of hospice of his disease progresses or he does not tolerate chemotherapy.  5.  Follow-up: Will be in the immediate future to start therapy.  I discussed the assessment and treatment plan  with the patient. The patient was provided an opportunity to ask questions and all were answered. The patient agreed with the plan and demonstrated an understanding of the instructions.   The patient was advised to call back or seek an in-person evaluation if the symptoms worsen or if the condition fails to improve as anticipated.  30  minutes were dedicated to this visit. The time was spent on reviewing laboratory data, imaging studies, discussing treatment options, and answering questions regarding future plan.   Zola Button, MD 08/04/2019 3:42 PM

## 2019-08-05 ENCOUNTER — Telehealth: Payer: Self-pay

## 2019-08-05 ENCOUNTER — Telehealth: Payer: Self-pay | Admitting: Oncology

## 2019-08-05 NOTE — Telephone Encounter (Signed)
Scheduled appt per 4/8 sch message - pt is wife is aware of appt date and time

## 2019-08-05 NOTE — Telephone Encounter (Signed)
Received a call from John Holt at [facility] to confirm the referral for palliative care has been received. [name] stated patient will be contacted.

## 2019-08-05 NOTE — Telephone Encounter (Signed)
Per Dr. Alen Blew, call placed to [facility] to place Palliative care referral. Left message at 9 am and as of 12:15 had not heard back from anyone at [facility]. Called again at 12:15 and left another message to please return call to [phone]-Dr. Shadad's nurse to confirm referral request was received.

## 2019-08-08 ENCOUNTER — Other Ambulatory Visit: Payer: Self-pay | Admitting: Oncology

## 2019-08-08 NOTE — Progress Notes (Signed)
Pharmacist Chemotherapy Monitoring - Initial Assessment    Anticipated start date: 08/12/2019  Regimen:  . Are orders appropriate based on the patient's diagnosis, regimen, and cycle? Yes . Does the plan date match the patient's scheduled date? Yes . Is the sequencing of drugs appropriate? Yes . Are the premedications appropriate for the patient's regimen? Yes . Prior Authorization for treatment is: Pending o If applicable, is the correct biosimilar selected based on the patient's insurance? not applicable  Organ Function and Labs: [name] Are dose adjustments needed based on the patient's renal function, hepatic function, or hematologic function? Yes . Are appropriate labs ordered prior to the start of patient's treatment? Yes . Other organ system assessment, if indicated: N/A . The following baseline labs, if indicated, have been ordered: N/A  Dose Assessment: . Are the drug doses appropriate? Yes . Are the following correct: o Drug concentrations Yes o IV fluid compatible with drug Yes o Administration routes Yes o Timing of therapy Yes . If applicable, does the patient have documented access for treatment and/or plans for port-a-cath placement? yes . If applicable, have lifetime cumulative doses been properly documented and assessed? yes Lifetime Dose Tracking  No doses have been documented on this patient for the following tracked chemicals: Doxorubicin, Epirubicin, Idarubicin, Daunorubicin, Mitoxantrone, Bleomycin, Oxaliplatin, Carboplatin, Liposomal Doxorubicin  o   Toxicity Monitoring/Prevention: . The patient has the following take home antiemetics prescribed: Prochlorperazine . The patient has the following take home medications prescribed: N/A . Medication allergies and previous infusion related reactions, if applicable, have been reviewed and addressed. Yes . The patient's current medication list has been assessed for drug-drug interactions with their chemotherapy regimen. no  significant drug-drug interactions were identified on review.  Order Review: . Are the treatment plan orders signed? Yes . Is the patient scheduled to see a provider prior to their treatment? No  I verify that I have reviewed each item in the above checklist and answered each question accordingly.  Philomena Course 08/08/2019 11:26 AM

## 2019-08-10 ENCOUNTER — Encounter: Payer: Self-pay | Admitting: Oncology

## 2019-08-10 ENCOUNTER — Other Ambulatory Visit: Payer: Self-pay

## 2019-08-10 ENCOUNTER — Inpatient Hospital Stay: Payer: Medicare PPO

## 2019-08-10 NOTE — Progress Notes (Signed)
Met with patient at registration to introduce myself as Arboriculturist and to offer available resources.  Discussed one-time $1000 Radio broadcast assistant to assist with personal expenses while going through treatment. Based on verbal guidelines, he states he does not qualify.  Gave him my card for any other financial questions or concerns.

## 2019-08-12 ENCOUNTER — Inpatient Hospital Stay: Payer: Medicare PPO

## 2019-08-12 ENCOUNTER — Other Ambulatory Visit: Payer: Self-pay

## 2019-08-12 VITALS — BP 119/73 | HR 67 | Temp 97.8°F | Resp 16

## 2019-08-12 DIAGNOSIS — C674 Malignant neoplasm of posterior wall of bladder: Secondary | ICD-10-CM

## 2019-08-12 DIAGNOSIS — Z5111 Encounter for antineoplastic chemotherapy: Secondary | ICD-10-CM | POA: Diagnosis not present

## 2019-08-12 DIAGNOSIS — Z452 Encounter for adjustment and management of vascular access device: Secondary | ICD-10-CM | POA: Diagnosis not present

## 2019-08-12 DIAGNOSIS — Z95828 Presence of other vascular implants and grafts: Secondary | ICD-10-CM

## 2019-08-12 DIAGNOSIS — C7951 Secondary malignant neoplasm of bone: Secondary | ICD-10-CM | POA: Diagnosis not present

## 2019-08-12 DIAGNOSIS — C679 Malignant neoplasm of bladder, unspecified: Secondary | ICD-10-CM | POA: Diagnosis not present

## 2019-08-12 LAB — CBC WITH DIFFERENTIAL (CANCER CENTER ONLY)
Abs Immature Granulocytes: 0.16 10*3/uL — ABNORMAL HIGH (ref 0.00–0.07)
Basophils Absolute: 0 10*3/uL (ref 0.0–0.1)
Basophils Relative: 0 %
Eosinophils Absolute: 0 10*3/uL (ref 0.0–0.5)
Eosinophils Relative: 0 %
HCT: 36.5 % — ABNORMAL LOW (ref 39.0–52.0)
Hemoglobin: 11.3 g/dL — ABNORMAL LOW (ref 13.0–17.0)
Immature Granulocytes: 1 %
Lymphocytes Relative: 5 %
Lymphs Abs: 0.7 10*3/uL (ref 0.7–4.0)
MCH: 25.7 pg — ABNORMAL LOW (ref 26.0–34.0)
MCHC: 31 g/dL (ref 30.0–36.0)
MCV: 83 fL (ref 80.0–100.0)
Monocytes Absolute: 0.9 10*3/uL (ref 0.1–1.0)
Monocytes Relative: 6 %
Neutro Abs: 12.8 10*3/uL — ABNORMAL HIGH (ref 1.7–7.7)
Neutrophils Relative %: 88 %
Platelet Count: 393 10*3/uL (ref 150–400)
RBC: 4.4 MIL/uL (ref 4.22–5.81)
RDW: 17.3 % — ABNORMAL HIGH (ref 11.5–15.5)
WBC Count: 14.5 10*3/uL — ABNORMAL HIGH (ref 4.0–10.5)
nRBC: 0 % (ref 0.0–0.2)

## 2019-08-12 LAB — CMP (CANCER CENTER ONLY)
ALT: 59 U/L — ABNORMAL HIGH (ref 0–44)
AST: 33 U/L (ref 15–41)
Albumin: 3.2 g/dL — ABNORMAL LOW (ref 3.5–5.0)
Alkaline Phosphatase: 180 U/L — ABNORMAL HIGH (ref 38–126)
Anion gap: 11 (ref 5–15)
BUN: 25 mg/dL — ABNORMAL HIGH (ref 8–23)
CO2: 22 mmol/L (ref 22–32)
Calcium: 9 mg/dL (ref 8.9–10.3)
Chloride: 107 mmol/L (ref 98–111)
Creatinine: 0.82 mg/dL (ref 0.61–1.24)
GFR, Est AFR Am: >60 mL/min (ref >60)
GFR, Estimated: >60 mL/min (ref >60)
Glucose, Bld: 137 mg/dL — ABNORMAL HIGH (ref 70–99)
Potassium: 3.8 mmol/L (ref 3.5–5.1)
Sodium: 140 mmol/L (ref 135–145)
Total Bilirubin: 0.3 mg/dL (ref 0.3–1.2)
Total Protein: 6.3 g/dL — ABNORMAL LOW (ref 6.5–8.1)

## 2019-08-12 MED ORDER — SODIUM CHLORIDE 0.9% FLUSH
10.0000 mL | INTRAVENOUS | Status: DC | PRN
  Administered 2019-08-12: 10 mL
  Filled 2019-08-12: qty 10

## 2019-08-12 MED ORDER — HEPARIN SOD (PORK) LOCK FLUSH 100 UNIT/ML IV SOLN
500.0000 [IU] | Freq: Once | INTRAVENOUS | Status: AC | PRN
  Administered 2019-08-12: 13:00:00 500 [IU]
  Filled 2019-08-12: qty 5

## 2019-08-12 MED ORDER — SODIUM CHLORIDE 0.9 % IV SOLN
367.0000 mg | Freq: Once | INTRAVENOUS | Status: AC
  Administered 2019-08-12: 12:00:00 370 mg via INTRAVENOUS
  Filled 2019-08-12: qty 37

## 2019-08-12 MED ORDER — ALTEPLASE 2 MG IJ SOLR
INTRAMUSCULAR | Status: AC
  Filled 2019-08-12: qty 2

## 2019-08-12 MED ORDER — SODIUM CHLORIDE 0.9 % IV SOLN
Freq: Once | INTRAVENOUS | Status: AC
  Filled 2019-08-12: qty 250

## 2019-08-12 MED ORDER — ALTEPLASE 2 MG IJ SOLR
2.0000 mg | Freq: Once | INTRAMUSCULAR | Status: AC | PRN
  Administered 2019-08-12: 2 mg
  Filled 2019-08-12: qty 2

## 2019-08-12 MED ORDER — SODIUM CHLORIDE 0.9 % IV SOLN
1000.0000 mg/m2 | Freq: Once | INTRAVENOUS | Status: AC
  Administered 2019-08-12: 1634 mg via INTRAVENOUS
  Filled 2019-08-12: qty 42.98

## 2019-08-12 MED ORDER — SODIUM CHLORIDE 0.9 % IV SOLN
150.0000 mg | Freq: Once | INTRAVENOUS | Status: AC
  Administered 2019-08-12: 150 mg via INTRAVENOUS
  Filled 2019-08-12: qty 150

## 2019-08-12 MED ORDER — SODIUM CHLORIDE 0.9% FLUSH
10.0000 mL | Freq: Once | INTRAVENOUS | Status: AC
  Administered 2019-08-12: 10 mL
  Filled 2019-08-12: qty 10

## 2019-08-12 MED ORDER — SODIUM CHLORIDE 0.9 % IV SOLN
10.0000 mg | Freq: Once | INTRAVENOUS | Status: AC
  Administered 2019-08-12: 10 mg via INTRAVENOUS
  Filled 2019-08-12: qty 10

## 2019-08-12 MED ORDER — PALONOSETRON HCL INJECTION 0.25 MG/5ML
0.2500 mg | Freq: Once | INTRAVENOUS | Status: AC
  Administered 2019-08-12: 0.25 mg via INTRAVENOUS

## 2019-08-12 MED ORDER — PALONOSETRON HCL INJECTION 0.25 MG/5ML
INTRAVENOUS | Status: AC
  Filled 2019-08-12: qty 5

## 2019-08-12 NOTE — Patient Instructions (Signed)
[facility] Discharge Instructions for Patients Receiving Chemotherapy  Today you received the following chemotherapy agents carboplatin/gemzar   To help prevent nausea and vomiting after your treatment, we encourage you to take your nausea medication as directed.     If you develop nausea and vomiting that is not controlled by your nausea medication, call the clinic.   BELOW ARE SYMPTOMS THAT SHOULD BE REPORTED IMMEDIATELY:  *FEVER GREATER THAN 100.5 F  *CHILLS WITH OR WITHOUT FEVER  NAUSEA AND VOMITING THAT IS NOT CONTROLLED WITH YOUR NAUSEA MEDICATION  *UNUSUAL SHORTNESS OF BREATH  *UNUSUAL BRUISING OR BLEEDING  TENDERNESS IN MOUTH AND THROAT WITH OR WITHOUT PRESENCE OF ULCERS  *URINARY PROBLEMS  *BOWEL PROBLEMS  UNUSUAL RASH Items with * indicate a potential emergency and should be followed up as soon as possible.  Feel free to call the clinic you have any questions or concerns. The clinic phone number is [phone].  Carboplatin injection What is this medicine? CARBOPLATIN (KAR boe pla tin) is a chemotherapy drug. It targets fast dividing cells, like cancer cells, and causes these cells to die. This medicine is used to treat ovarian cancer and many other cancers. This medicine may be used for other purposes; ask your health care provider or pharmacist if you have questions. COMMON BRAND NAME(S): Paraplatin What should I tell my health care provider before I take this medicine? They need to know if you have any of these conditions:  blood disorders  hearing problems  kidney disease  recent or ongoing radiation therapy  an unusual or allergic reaction to carboplatin, cisplatin, other chemotherapy, other medicines, foods, dyes, or preservatives  pregnant or trying to get pregnant  breast-feeding How should I use this medicine? This drug is usually given as an infusion into a vein. It is administered in a hospital or clinic by a specially  trained health care professional. Talk to your pediatrician regarding the use of this medicine in children. Special care may be needed. Overdosage: If you think you have taken too much of this medicine contact a poison control center or emergency room at once. NOTE: This medicine is only for you. Do not share this medicine with others. What if I miss a dose? It is important not to miss a dose. Call your doctor or health care professional if you are unable to keep an appointment. What may interact with this medicine?  medicines for seizures  medicines to increase blood counts like filgrastim, pegfilgrastim, sargramostim  some antibiotics like amikacin, gentamicin, neomycin, streptomycin, tobramycin  vaccines Talk to your doctor or health care professional before taking any of these medicines:  acetaminophen  aspirin  ibuprofen  ketoprofen  naproxen This list may not describe all possible interactions. Give your health care provider a list of all the medicines, herbs, non-prescription drugs, or dietary supplements you use. Also tell them if you smoke, drink alcohol, or use illegal drugs. Some items may interact with your medicine. What should I watch for while using this medicine? Your condition will be monitored carefully while you are receiving this medicine. You will need important blood work done while you are taking this medicine. This drug may make you feel generally unwell. This is not uncommon, as chemotherapy can affect healthy cells as well as cancer cells. Report any side effects. Continue your course of treatment even though you feel ill unless your doctor tells you to stop. In some cases, you may be given additional medicines to help with side effects.  Follow all directions for their use. Call your doctor or health care professional for advice if you get a fever, chills or sore throat, or other symptoms of a cold or flu. Do not treat yourself. This drug decreases your body's  ability to fight infections. Try to avoid being around people who are sick. This medicine may increase your risk to bruise or bleed. Call your doctor or health care professional if you notice any unusual bleeding. Be careful brushing and flossing your teeth or using a toothpick because you may get an infection or bleed more easily. If you have any dental work done, tell your dentist you are receiving this medicine. Avoid taking products that contain aspirin, acetaminophen, ibuprofen, naproxen, or ketoprofen unless instructed by your doctor. These medicines may hide a fever. Do not become pregnant while taking this medicine. Women should inform their doctor if they wish to become pregnant or think they might be pregnant. There is a potential for serious side effects to an unborn child. Talk to your health care professional or pharmacist for more information. Do not breast-feed an infant while taking this medicine. What side effects may I notice from receiving this medicine? Side effects that you should report to your doctor or health care professional as soon as possible:  allergic reactions like skin rash, itching or hives, swelling of the face, lips, or tongue  signs of infection - fever or chills, cough, sore throat, pain or difficulty passing urine  signs of decreased platelets or bleeding - bruising, pinpoint red spots on the skin, black, tarry stools, nosebleeds  signs of decreased red blood cells - unusually weak or tired, fainting spells, lightheadedness  breathing problems  changes in hearing  changes in vision  chest pain  high blood pressure  low blood counts - This drug may decrease the number of white blood cells, red blood cells and platelets. You may be at increased risk for infections and bleeding.  nausea and vomiting  pain, swelling, redness or irritation at the injection site  pain, tingling, numbness in the hands or feet  problems with balance, talking,  walking  trouble passing urine or change in the amount of urine Side effects that usually do not require medical attention (report to your doctor or health care professional if they continue or are bothersome):  hair loss  loss of appetite  metallic taste in the mouth or changes in taste This list may not describe all possible side effects. Call your doctor for medical advice about side effects. You may report side effects to FDA at 1-800-FDA-1088. Where should I keep my medicine? This drug is given in a hospital or clinic and will not be stored at home. NOTE: This sheet is a summary. It may not cover all possible information. If you have questions about this medicine, talk to your doctor, pharmacist, or health care provider.  2020 Elsevier/Gold Standard (2007-07-20 14:38:05)  Gemcitabine injection What is this medicine? GEMCITABINE (jem SYE ta been) is a chemotherapy drug. This medicine is used to treat many types of cancer like breast cancer, lung cancer, pancreatic cancer, and ovarian cancer. This medicine may be used for other purposes; ask your health care provider or pharmacist if you have questions. COMMON BRAND NAME(S): Gemzar, Infugem What should I tell my health care provider before I take this medicine? They need to know if you have any of these conditions:  blood disorders  infection  kidney disease  liver disease  lung or breathing disease, like  asthma  recent or ongoing radiation therapy  an unusual or allergic reaction to gemcitabine, other chemotherapy, other medicines, foods, dyes, or preservatives  pregnant or trying to get pregnant  breast-feeding How should I use this medicine? This drug is given as an infusion into a vein. It is administered in a hospital or clinic by a specially trained health care professional. Talk to your pediatrician regarding the use of this medicine in children. Special care may be needed. Overdosage: If you think you have  taken too much of this medicine contact a poison control center or emergency room at once. NOTE: This medicine is only for you. Do not share this medicine with others. What if I miss a dose? It is important not to miss your dose. Call your doctor or health care professional if you are unable to keep an appointment. What may interact with this medicine?  medicines to increase blood counts like filgrastim, pegfilgrastim, sargramostim  some other chemotherapy drugs like cisplatin  vaccines Talk to your doctor or health care professional before taking any of these medicines:  acetaminophen  aspirin  ibuprofen  ketoprofen  naproxen This list may not describe all possible interactions. Give your health care provider a list of all the medicines, herbs, non-prescription drugs, or dietary supplements you use. Also tell them if you smoke, drink alcohol, or use illegal drugs. Some items may interact with your medicine. What should I watch for while using this medicine? Visit your doctor for checks on your progress. This drug may make you feel generally unwell. This is not uncommon, as chemotherapy can affect healthy cells as well as cancer cells. Report any side effects. Continue your course of treatment even though you feel ill unless your doctor tells you to stop. In some cases, you may be given additional medicines to help with side effects. Follow all directions for their use. Call your doctor or health care professional for advice if you get a fever, chills or sore throat, or other symptoms of a cold or flu. Do not treat yourself. This drug decreases your body's ability to fight infections. Try to avoid being around people who are sick. This medicine may increase your risk to bruise or bleed. Call your doctor or health care professional if you notice any unusual bleeding. Be careful brushing and flossing your teeth or using a toothpick because you may get an infection or bleed more easily. If  you have any dental work done, tell your dentist you are receiving this medicine. Avoid taking products that contain aspirin, acetaminophen, ibuprofen, naproxen, or ketoprofen unless instructed by your doctor. These medicines may hide a fever. Do not become pregnant while taking this medicine or for 6 months after stopping it. Women should inform their doctor if they wish to become pregnant or think they might be pregnant. Men should not father a child while taking this medicine and for 3 months after stopping it. There is a potential for serious side effects to an unborn child. Talk to your health care professional or pharmacist for more information. Do not breast-feed an infant while taking this medicine or for at least 1 week after stopping it. Men should inform their doctors if they wish to father a child. This medicine may lower sperm counts. Talk with your doctor or health care professional if you are concerned about your fertility. What side effects may I notice from receiving this medicine? Side effects that you should report to your doctor or health care professional as soon  as possible:  allergic reactions like skin rash, itching or hives, swelling of the face, lips, or tongue  breathing problems  pain, redness, or irritation at site where injected  signs and symptoms of a dangerous change in heartbeat or heart rhythm like chest pain; dizziness; fast or irregular heartbeat; palpitations; feeling faint or lightheaded, falls; breathing problems  signs of decreased platelets or bleeding - bruising, pinpoint red spots on the skin, black, tarry stools, blood in the urine  signs of decreased red blood cells - unusually weak or tired, feeling faint or lightheaded, falls  signs of infection - fever or chills, cough, sore throat, pain or difficulty passing urine  signs and symptoms of kidney injury like trouble passing urine or change in the amount of urine  signs and symptoms of liver injury  like dark yellow or brown urine; general ill feeling or flu-like symptoms; light-colored stools; loss of appetite; nausea; right upper belly pain; unusually weak or tired; yellowing of the eyes or skin  swelling of ankles, feet, hands Side effects that usually do not require medical attention (report to your doctor or health care professional if they continue or are bothersome):  constipation  diarrhea  hair loss  loss of appetite  nausea  rash  vomiting This list may not describe all possible side effects. Call your doctor for medical advice about side effects. You may report side effects to FDA at 1-800-FDA-1088. Where should I keep my medicine? This drug is given in a hospital or clinic and will not be stored at home. NOTE: This sheet is a summary. It may not cover all possible information. If you have questions about this medicine, talk to your doctor, pharmacist, or health care provider.  2020 Elsevier/Gold Standard (2017-07-08 18:06:11)

## 2019-08-15 ENCOUNTER — Telehealth: Payer: Self-pay | Admitting: Emergency Medicine

## 2019-08-15 NOTE — Progress Notes (Signed)
Pharmacist Chemotherapy Monitoring - Follow Up Assessment    I verify that I have reviewed each item in the below checklist:  . Regimen for the patient is scheduled for the appropriate day and plan matches scheduled date. [name] Appropriate non-routine labs are ordered dependent on drug ordered. . If applicable, additional medications reviewed and ordered per protocol based on lifetime cumulative doses and/or treatment regimen.   Plan for follow-up and/or issues identified: No . I-vent associated with next due treatment: No . MD and/or nursing notified: No  Delane Ginger 08/15/2019 7:56 AM

## 2019-08-15 NOTE — Telephone Encounter (Signed)
Chemo f/u call, spoke with patient and his wife.  Pt denies any acute changes or symptoms.  Reports moderate constipation not relieved by miralax used once daily.  Pt advised to drink 1/2 bottle magnesium citrate (OTC), wait 2-3 hours for results, then drink the rest of the bottle.  Pt also advised to keep taking decadron dose as prescribed until he runs out and to f/u with MD [facility] on whether or not he needs to continue taking it at his next visit.  Pt verbalized understanding of all instructions and to f/u as needed if his constipation does not improve.

## 2019-08-17 ENCOUNTER — Inpatient Hospital Stay: Payer: Medicare PPO

## 2019-08-17 ENCOUNTER — Other Ambulatory Visit: Payer: Self-pay

## 2019-08-17 NOTE — Progress Notes (Signed)
Nutrition Assessment   Reason for Assessment:  Patient identified on Malnutrition Screening tool   ASSESSMENT:  74 year old male with stage IV bladder cancer with recurrence.  S/p radical cystectomy on 02/25/2019.  Past medical history of seizure, bipolar.    Appointment originally set up as phone visit but patient showed up in clinic.  Patient reports that he has a good appetite.  Does all the cooking for him and wife.  Breakfast maybe a smoothie (whole milk, fruit, yogurt, whey protein) or egg and fruit or waffles with syrup.  Often has snack mid am.  Lunch is usually a pimento cheese sandwich.  Dinner is meat and couple of sides.  Tonight is salmon with broccoli and squash (sauteed in butter with seasoning).  Likes ice cream sandwich and protein bars.  Does not drink oral nutrition supplements.    Denies nausea.  Does report issues with constipation and diarrhea.  "We are working on this."  Medications: reviewed   Labs: reviewed   Anthropometrics:   Height: 68 inches Weight: 120 lb 4/4 UBW: 130 lb 02/25/2019 Patient reports since surgery has not been able to gain weight back.   BMI: 18   Estimated Energy Needs  Kcals: 1600-1800 Protein: 80-90 g Fluid: > 1.6 L   NUTRITION DIAGNOSIS: Increased needs related to cancer as evidenced by 7% weight loss in the last 6 months   INTERVENTION:  Reviewed strategies to increase calories and protein especially with patient reporting good appetite.  Handout provided Encouraged snacks between meals.  Discussed high calorie snacks to add. Provided samples of oral nutrition supplements for patient to try.  Discussed that these can be used in smoothie or added as snack between meals. Patient provided contact information   MONITORING, EVALUATION, GOAL: Patient will consume adequate calories to prevent further weight loss   Next Visit: no follow-up. Patient will call RD if needed in the future  Zacharias Ridling B. Zenia Resides, [place], [place] Registered  Dietitian [phone] (pager)

## 2019-08-18 ENCOUNTER — Other Ambulatory Visit: Payer: Medicare PPO | Admitting: Internal Medicine

## 2019-08-18 DIAGNOSIS — Z515 Encounter for palliative care: Secondary | ICD-10-CM | POA: Diagnosis not present

## 2019-08-18 NOTE — Progress Notes (Signed)
[facility] Consult Note Telephone: [phone]  Fax: [phone]  PATIENT NAME: John Holt DOB: [birth year] MRN: [id number]  PRIMARY CARE PROVIDER:   [name], MD  REFERRING PROVIDER:  Dr. Alen Blew  RESPONSIBLE PARTY:   Self and wife      RECOMMENDATIONS and PLAN:  Palliative Care Encounter  Z51.5  1.  Advance Care Planning:  Explanation of palliative and hospice care.  Goals discussed which are to maintain normal daily activities and social life as long as possible.  He does not want to feel drugged with use of opiods to manage cancer related pain.  MOST form reviewed.  Pt. Desires DNAR and no tube feeding.  I will supply a copy of MOST form for additional review and completion.  Signed DNR form will be provided as well. Palliative care will f/u with patient and wife in 2 weeks.   2.  Cancer related pain:  Currently controlled with use of Ibuprofen 200mg  every 4 hrs.  Begin eating snack prior to med.  Morphine on hand if needed for severe pain.  Continue to monitor  3.  Constipation:  Continue adequate hydration.  Miralax daily and adjust if needed.  High fiber meals.  Walking for exercise as tolerated.  Senna or home supply of Senna S 2 tabs twice daily.  4.  Epilepsy:  Managed with use of Clonazepam and Zonegran.  Continue follow-up with Neurologist.  Monitor  Due to the COVID-19 crisis, this visit occurred telephonically and was consented and initiated by patient and or family member.   I spent 60 minutes providing this consultation,  from 1015 to 1115. More than 50% of the time in this consultation was spent coordinating communication with patient and wife.  HISTORY OF PRESENT ILLNESS:  John Holt is a 74 y.o. year old male with multiple medical problems including epilepsy, metastatic bladder cancer to back and liver. He reports some decrease in energy but still has a good appetite.  He is able to perform his own ADLs.   He has begun receiving palliative chemotherapy to decrease symptoms.  Palliative Care was asked to help address goals of care.   CODE STATUS: DNAR  PPS: 50% HOSPICE ELIGIBILITY/DIAGNOSIS: YES/Metastatic bladder cancer but is currently receiving chemotherapy  PAST MEDICAL HISTORY:  Past Medical History:  Diagnosis Date  . Anxiety disorder   . Bladder cancer [facility]) urologist-- dr Alyson Ingles ([facility]) and dr Lawerance Bach [facility]):  oncologist-  dr Alen Blew ([facility]) & dr Marcello Moores [facility])   dx 2018--- s/p  TURBT's ;  hx BCG tx's.  chemotherapy completed 2019, and immunotherapy Keytruda , last one 10-21-2018  . BPH (benign prostatic hypertrophy) with urinary obstruction   . History of adenomatous polyp of colon   . History of closed head injury    2000-- fell off ladder--  temporary memory loss resolved  . History of psychosis    x2  last one documented 2006  w/ hallucination  and suicide ideation  . Nocturia   . Port-A-Cath in place 2018  . S/P placement of VNS (vagus nerve stimulation) device 01/28/2018   per pt swipe magnet twice daily  . Sigmoid diverticulosis   . Sinus bradycardia seen on cardiac monitor    cardiology --  dr g. taylor;   event monitor results 11-15-2018 in epic,  NSR w/ ST and SB and a nocturnal pause   . Temporal lobe epilepsy syndrome [facility]) neurologist-  dr Raliegh Ip. Delice Lesch-  avergae  one every 2 weeks "legs jerking around, per wife , pt unaware he's doing this"   localization-related right temporal lobe --  mostly nocturnal w/ bilateral lower extremitity movement, staring and moaning then dissorietation afterwards (12-02-2018 per pt last daytime seizure 11-11-2018 and last nighttime seizure 11-29-2018  . Vesicoureteral reflux, bilateral   . Wears glasses     SOCIAL HX:  Social History   Tobacco Use  . Smoking status: Former Smoker    Packs/day: 1.00    Years: 15.00    Pack years: 15.00    Types: Cigarettes    Quit date: 04/03/1972    Years since quitting: 47.4  .  Smokeless tobacco: Never Used  Substance Use Topics  . Alcohol use: Yes    Alcohol/week: 14.0 standard drinks    Types: 14 Glasses of wine per week    Comment: 1 or 2  wine daily; no consumption in last 24 hours      PERTINENT MEDICATIONS:  Outpatient Encounter Medications as of 08/18/2019  Medication Sig  . cloBAZam (ONFI) 10 MG tablet Take 1 tablet (10 mg total) by mouth at bedtime.  . clonazePAM (KLONOPIN) 0.5 MG tablet Take 0.5 mg by mouth 2 (two) times daily as needed (For seizures per wife).   [name] dexamethasone (DECADRON) 4 MG tablet Take 1 tablet (4 mg total) by mouth 2 (two) times daily with a meal.  . levothyroxine (SYNTHROID) 125 MCG tablet Take 125 mcg by mouth daily before breakfast.  . loperamide (IMODIUM A-D) 2 MG tablet Take 2 mg by mouth 4 (four) times daily as needed for diarrhea or loose stools.  . Multiple Vitamin (MULTIVITAMIN WITH MINERALS) TABS tablet Take 1 tablet by mouth in the morning and at bedtime.   . ondansetron (ZOFRAN ODT) 4 MG disintegrating tablet 4mg  ODT q4 hours prn nausea/vomit  . polyethylene glycol (MIRALAX / GLYCOLAX) 17 g packet Take 17 g by mouth daily. (Patient taking differently: Take 17 g by mouth daily as needed for moderate constipation. )  . prochlorperazine (COMPAZINE) 10 MG tablet Take 1 tablet (10 mg total) by mouth every 6 (six) hours as needed for nausea or vomiting.  . senna (SENOKOT) 8.6 MG TABS tablet Take 2 tablets (17.2 mg total) by mouth 2 (two) times daily.  [name] zonisamide (ZONEGRAN) 100 MG capsule TAKE 3 CAPSULES EACH NIGHT (Patient taking differently: Take 300 mg by mouth at bedtime. )   No facility-administered encounter medications on file as of 08/18/2019.    PHYSICAL EXAM:   General: NAD Cardiovascular:Unable to assess Pulmonary:voice is hoarse, Neurological: Alert and oriented x3,    Gonzella Lex, NP-C

## 2019-08-19 ENCOUNTER — Other Ambulatory Visit: Payer: Self-pay

## 2019-08-19 ENCOUNTER — Inpatient Hospital Stay: Payer: Medicare PPO

## 2019-08-19 ENCOUNTER — Inpatient Hospital Stay: Payer: Medicare PPO | Admitting: Oncology

## 2019-08-19 VITALS — BP 98/65 | HR 71 | Temp 98.7°F | Resp 20 | Ht 68.0 in | Wt 118.6 lb

## 2019-08-19 DIAGNOSIS — C674 Malignant neoplasm of posterior wall of bladder: Secondary | ICD-10-CM

## 2019-08-19 DIAGNOSIS — C7951 Secondary malignant neoplasm of bone: Secondary | ICD-10-CM | POA: Diagnosis not present

## 2019-08-19 DIAGNOSIS — Z95828 Presence of other vascular implants and grafts: Secondary | ICD-10-CM

## 2019-08-19 DIAGNOSIS — Z5111 Encounter for antineoplastic chemotherapy: Secondary | ICD-10-CM | POA: Diagnosis not present

## 2019-08-19 DIAGNOSIS — C679 Malignant neoplasm of bladder, unspecified: Secondary | ICD-10-CM | POA: Diagnosis not present

## 2019-08-19 DIAGNOSIS — Z452 Encounter for adjustment and management of vascular access device: Secondary | ICD-10-CM | POA: Diagnosis not present

## 2019-08-19 LAB — CBC WITH DIFFERENTIAL (CANCER CENTER ONLY)
Abs Immature Granulocytes: 0.03 10*3/uL (ref 0.00–0.07)
Basophils Absolute: 0 10*3/uL (ref 0.0–0.1)
Basophils Relative: 0 %
Eosinophils Absolute: 0 10*3/uL (ref 0.0–0.5)
Eosinophils Relative: 0 %
HCT: 32.8 % — ABNORMAL LOW (ref 39.0–52.0)
Hemoglobin: 10.2 g/dL — ABNORMAL LOW (ref 13.0–17.0)
Immature Granulocytes: 1 %
Lymphocytes Relative: 11 %
Lymphs Abs: 0.6 10*3/uL — ABNORMAL LOW (ref 0.7–4.0)
MCH: 25.6 pg — ABNORMAL LOW (ref 26.0–34.0)
MCHC: 31.1 g/dL (ref 30.0–36.0)
MCV: 82.4 fL (ref 80.0–100.0)
Monocytes Absolute: 0.3 10*3/uL (ref 0.1–1.0)
Monocytes Relative: 4 %
Neutro Abs: 5 10*3/uL (ref 1.7–7.7)
Neutrophils Relative %: 84 %
Platelet Count: 172 10*3/uL (ref 150–400)
RBC: 3.98 MIL/uL — ABNORMAL LOW (ref 4.22–5.81)
RDW: 16.6 % — ABNORMAL HIGH (ref 11.5–15.5)
WBC Count: 6 10*3/uL (ref 4.0–10.5)
nRBC: 0 % (ref 0.0–0.2)

## 2019-08-19 LAB — CMP (CANCER CENTER ONLY)
ALT: 42 U/L (ref 0–44)
AST: 30 U/L (ref 15–41)
Albumin: 2.7 g/dL — ABNORMAL LOW (ref 3.5–5.0)
Alkaline Phosphatase: 153 U/L — ABNORMAL HIGH (ref 38–126)
Anion gap: 12 (ref 5–15)
BUN: 27 mg/dL — ABNORMAL HIGH (ref 8–23)
CO2: 21 mmol/L — ABNORMAL LOW (ref 22–32)
Calcium: 8.6 mg/dL — ABNORMAL LOW (ref 8.9–10.3)
Chloride: 111 mmol/L (ref 98–111)
Creatinine: 0.82 mg/dL (ref 0.61–1.24)
GFR, Est AFR Am: >60 mL/min (ref >60)
GFR, Estimated: >60 mL/min (ref >60)
Glucose, Bld: 129 mg/dL — ABNORMAL HIGH (ref 70–99)
Potassium: 4 mmol/L (ref 3.5–5.1)
Sodium: 144 mmol/L (ref 135–145)
Total Bilirubin: 0.2 mg/dL — ABNORMAL LOW (ref 0.3–1.2)
Total Protein: 6 g/dL — ABNORMAL LOW (ref 6.5–8.1)

## 2019-08-19 MED ORDER — PROCHLORPERAZINE MALEATE 10 MG PO TABS
ORAL_TABLET | ORAL | Status: AC
  Filled 2019-08-19: qty 1

## 2019-08-19 MED ORDER — SODIUM CHLORIDE 0.9% FLUSH
10.0000 mL | Freq: Once | INTRAVENOUS | Status: AC
  Administered 2019-08-19: 10 mL
  Filled 2019-08-19: qty 10

## 2019-08-19 MED ORDER — SODIUM CHLORIDE 0.9 % IV SOLN
1000.0000 mg/m2 | Freq: Once | INTRAVENOUS | Status: AC
  Administered 2019-08-19: 10:00:00 1634 mg via INTRAVENOUS
  Filled 2019-08-19: qty 42.98

## 2019-08-19 MED ORDER — SODIUM CHLORIDE 0.9 % IV SOLN
Freq: Once | INTRAVENOUS | Status: AC
  Filled 2019-08-19: qty 250

## 2019-08-19 MED ORDER — PROCHLORPERAZINE MALEATE 10 MG PO TABS
10.0000 mg | ORAL_TABLET | Freq: Once | ORAL | Status: AC
  Administered 2019-08-19: 10:00:00 10 mg via ORAL

## 2019-08-19 MED ORDER — SODIUM CHLORIDE 0.9% FLUSH
10.0000 mL | INTRAVENOUS | Status: DC | PRN
  Administered 2019-08-19: 10 mL
  Filled 2019-08-19: qty 10

## 2019-08-19 MED ORDER — HEPARIN SOD (PORK) LOCK FLUSH 100 UNIT/ML IV SOLN
500.0000 [IU] | Freq: Once | INTRAVENOUS | Status: AC | PRN
  Administered 2019-08-19: 500 [IU]
  Filled 2019-08-19: qty 5

## 2019-08-19 NOTE — Patient Instructions (Signed)
[facility] Discharge Instructions for Patients Receiving Chemotherapy  Today you received the following chemotherapy agent: Gemcitabine  To help prevent nausea and vomiting after your treatment, we encourage you to take your nausea medication as directed by your MD.   If you develop nausea and vomiting that is not controlled by your nausea medication, call the clinic.   BELOW ARE SYMPTOMS THAT SHOULD BE REPORTED IMMEDIATELY:  *FEVER GREATER THAN 100.5 F  *CHILLS WITH OR WITHOUT FEVER  NAUSEA AND VOMITING THAT IS NOT CONTROLLED WITH YOUR NAUSEA MEDICATION  *UNUSUAL SHORTNESS OF BREATH  *UNUSUAL BRUISING OR BLEEDING  TENDERNESS IN MOUTH AND THROAT WITH OR WITHOUT PRESENCE OF ULCERS  *URINARY PROBLEMS  *BOWEL PROBLEMS  UNUSUAL RASH Items with * indicate a potential emergency and should be followed up as soon as possible.  Feel free to call the clinic should you have any questions or concerns. The clinic phone number is [phone].  Please show the CHEMO ALERT CARD at check-in to the Emergency Department and triage nurse.  Coronavirus (COVID-19) Are you at risk?  Are you at risk for the Coronavirus (COVID-19)?  To be considered HIGH RISK for Coronavirus (COVID-19), you have to meet the following criteria:  . Traveled to [place], [place], [place], [place] or [place]; or in the [place] to [place], [place], [place], or [place]; and have fever, cough, and shortness of breath within the last 2 weeks of travel OR . Been in close contact with a person diagnosed with COVID-19 within the last 2 weeks and have fever, cough, and shortness of breath . IF YOU DO NOT MEET THESE CRITERIA, YOU ARE CONSIDERED LOW RISK FOR COVID-19.  What to do if you are HIGH RISK for COVID-19?  . If you are having a medical emergency, call 911. . Seek medical care right away. Before you go to a doctor's office, urgent care or emergency department, call ahead and tell them  about your recent travel, contact with someone diagnosed with COVID-19, and your symptoms. You should receive instructions from your physician's office regarding next steps of care.  . When you arrive at healthcare provider, tell the healthcare staff immediately you have returned from visiting [place], [place], [place], [place] or [place]; or traveled in the [place] to [place], [place], [place], or [place]; in the last two weeks or you have been in close contact with a person diagnosed with COVID-19 in the last 2 weeks.   . Tell the health care staff about your symptoms: fever, cough and shortness of breath. . After you have been seen by a medical provider, you will be either: o Tested for (COVID-19) and discharged home on quarantine except to seek medical care if symptoms worsen, and asked to  - Stay home and avoid contact with others until you get your results (4-5 days)  - Avoid travel on public transportation if possible (such as bus, train, or airplane) or o Sent to the Emergency Department by EMS for evaluation, COVID-19 testing, and possible admission depending on your condition and test results.  What to do if you are LOW RISK for COVID-19?  Reduce your risk of any infection by using the same precautions used for avoiding the common cold or flu:  . Wash your hands often with soap and warm water for at least 20 seconds.  If soap and water are not readily available, use an alcohol-based hand sanitizer with at least 60% alcohol.  . If   coughing or sneezing, cover your mouth and nose by coughing or sneezing into the elbow areas of your shirt or coat, into a tissue or into your sleeve (not your hands). . Avoid shaking hands with others and consider head nods or verbal greetings only. . Avoid touching your eyes, nose, or mouth with unwashed hands.  . Avoid close contact with people who are sick. . Avoid places or events with large numbers of people in one location, like concerts or  sporting events. . Carefully consider travel plans you have or are making. . If you are planning any travel outside or inside the [place], visit the CDC's Travelers' Health webpage for the latest health notices. . If you have some symptoms but not all symptoms, continue to monitor at home and seek medical attention if your symptoms worsen. . If you are having a medical emergency, call 911.   ADDITIONAL HEALTHCARE OPTIONS FOR PATIENTS   Telehealth / e-Visit: [email or web address]         [facility]: [phone]  [facility]: [phone]                   [facility]: [phone]   

## 2019-08-19 NOTE — Progress Notes (Signed)
Hematology and Oncology Follow Up   [facility] [id number] August 09, 1945 74 y.o. 08/19/2019 9:00 AM [facility], [facility], [facility], Ravisankar, MD      Principle Diagnosis: 74 year old man with bladder cancer diagnosed in 2018.  He subsequently developed stage IV disease with bone and hepatic involvement documented in April 2021.   Prior Therapy:  He is S/P  neoadjuvant chemotherapy in preparation for radical cystectomy utilizing MVAC for 4 cycles under the care of Dr. [facility]cello [facility].  He subsequently declined cystectomy for personal reasons.    He remained disease free till January 2020 where he has recurrence of non-muscle invasive disease.  He was started on Pembrolizumab in March 2020 after declining cystectomy.    Repeat TURBT in April 2020 showed persistent high-grade noninvasive papillary urothelial carcinoma.  He underwent BCG treatment in May 2020 under the care of Dr. Alyson [facility].  Repeat TURBT in August 2020 showed muscle invasive disease without any evidence of metastatic disease based on imaging studies in June 2020.  He is status post radical cystectomy completed on February 25, 2019 with T3a N0 disease.   Current therapy: Gemcitabine and carboplatin started on August 12, 2019.  Today is day 8 of cycle 1 of therapy.  Interim History: [facility] is here for return evaluation.  Since the last visit, he received day 1 of cycle 1 of therapy without any major complications.  She did have issues with constipation that has resolved at this time.  She denies any nausea, vomiting, pain.  He does report periodic back pain and flank pain which is manageable with over-the-counter pain medication.  He has been tapered off dexamethasone and does not take any morphine.  His performance status is reasonable is ambulating without any major difficulties.  His p.o. intake still marginal but weight is stable.     Medications: Updated on review. Current Outpatient  Medications  Medication Sig Dispense Refill  . cloBAZam (ONFI) 10 MG tablet Take 1 tablet (10 mg total) by mouth at bedtime. 30 tablet 0  . clonazePAM (KLONOPIN) 0.5 MG tablet Take 0.5 mg by mouth 2 (two) times daily as needed (For seizures per wife).     [name] dexamethasone (DECADRON) 4 MG tablet Take 1 tablet (4 mg total) by mouth 2 (two) times daily with a meal. 30 tablet 0  . levothyroxine (SYNTHROID) 125 MCG tablet Take 125 mcg by mouth daily before breakfast.    . loperamide (IMODIUM A-D) 2 MG tablet Take 2 mg by mouth 4 (four) times daily as needed for diarrhea or loose stools.    . Multiple Vitamin (MULTIVITAMIN WITH MINERALS) TABS tablet Take 1 tablet by mouth in the morning and at bedtime.     . ondansetron (ZOFRAN ODT) 4 MG disintegrating tablet 4mg  ODT q4 hours prn nausea/vomit 10 tablet 0  . polyethylene glycol (MIRALAX / GLYCOLAX) 17 g packet Take 17 g by mouth daily. (Patient taking differently: Take 17 g by mouth daily as needed for moderate constipation. ) 14 each 0  . prochlorperazine (COMPAZINE) 10 MG tablet Take 1 tablet (10 mg total) by mouth every 6 (six) hours as needed for nausea or vomiting. 30 tablet 0  . senna (SENOKOT) 8.6 MG TABS tablet Take 2 tablets (17.2 mg total) by mouth 2 (two) times daily. 120 tablet 0  . zonisamide (ZONEGRAN) 100 MG capsule TAKE 3 CAPSULES EACH NIGHT (Patient taking differently: Take 300 mg by mouth at bedtime. ) 270 capsule 3   No current  facility-administered medications for this visit.     Allergies:  Allergies  Allergen Reactions  . Chlorhexidine Rash    Full body  . Povidone-Iodine Rash     Physical exam: Blood pressure 98/65, pulse 71, temperature 98.7 F (37.1 C), temperature source Temporal, resp. rate 20, height 5\' 8"  (1.727 m), weight 118 lb 9.6 oz (53.8 kg), SpO2 96 %.   ECOG 1  General appearance: Comfortable appearing without any discomfort.  Appears overall frail. Head: Normocephalic without any trauma Oropharynx:  Mucous membranes are moist and pink without any thrush or ulcers. Eyes: Pupils are equal and round reactive to light. Lymph nodes: No cervical, supraclavicular, inguinal or axillary lymphadenopathy.   Heart:regular rate and rhythm.  S1 and S2 without leg edema. Lung: Clear without any rhonchi or wheezes.  No dullness to percussion. Abdomin: Soft, nontender, nondistended with good bowel sounds.  No hepatosplenomegaly. Musculoskeletal: No joint deformity or effusion.  Full range of motion noted. Neurological: No deficits noted on motor, sensory and deep tendon reflex exam. Skin: No petechial rash or dryness.  Appeared moist.  Psychiatric: Mood and affect appeared appropriate.    Lab Results: Lab Results  Component Value Date   WBC 14.5 (H) 08/12/2019   HGB 11.3 (L) 08/12/2019   HCT 36.5 (L) 08/12/2019   MCV 83.0 08/12/2019   PLT 393 08/12/2019     Chemistry      Component Value Date/Time   NA 140 08/12/2019 0850   K 3.8 08/12/2019 0850   CL 107 08/12/2019 0850   CO2 22 08/12/2019 0850   BUN 25 (H) 08/12/2019 0850   CREATININE 0.82 08/12/2019 0850      Component Value Date/Time   CALCIUM 9.0 08/12/2019 0850   ALKPHOS 180 (H) 08/12/2019 0850   AST 33 08/12/2019 0850   ALT 59 (H) 08/12/2019 0850   BILITOT 0.3 08/12/2019 0850       Impression and Plan:  74 year old man with:  1.  Bladder cancer diagnosed in 2018 and subsequently developed stage IV disease with hepatic and bone metastasis in April 2021.  He is currently receiving salvage chemotherapy utilizing carboplatin and gemcitabine and completed day 1 of therapy.  Risks and benefits of continuing chemotherapy were reviewed today.  Potential complications include nausea, vomiting, depression neutropenia were reviewed.  Alternative option would be immunotherapy and supportive care.  He is agreeable to continue with this treatment at this time.  2.  IV access: Port-A-Cath remains in place and currently use without any  issues.  3.  Antiemetics: Compazine is available to him without any nausea or vomiting.  4.  Prognosis and goals of care: His disease is incurable although aggressive measures are warranted given his reasonable performance status.  5.  Pain: Manageable with over-the-counter pain medication and does not require any prescription medication for the time being.  Continue to monitor and address periodically.  6.  Follow-up: In 2 weeks for the start of cycle 2 of therapy.    30  minutes were spent on this visit.  The time was dedicated to updating his disease status, discussing treatment options addressing complications related to therapy and future plan of care reviewed.  Zola Button, MD 08/19/2019 9:00 AM

## 2019-08-27 ENCOUNTER — Encounter: Payer: Self-pay | Admitting: Oncology

## 2019-08-29 ENCOUNTER — Telehealth: Payer: Self-pay

## 2019-08-29 NOTE — Telephone Encounter (Signed)
Returned TC to pt to let him know that Per Dr Alen Blew I have printed out his doctors notes from 4/6 and 4/8 and these notes should have enough information on it to cover the airlines needs. Patient verbalized understanding and said that he would be in today to pick up notes.

## 2019-08-29 NOTE — Telephone Encounter (Signed)
PROGRESS NOTE: Patient came and picked up doctors notes

## 2019-08-29 NOTE — Progress Notes (Signed)
Pharmacist Chemotherapy Monitoring - Follow Up Assessment    I verify that I have reviewed each item in the below checklist:  . Regimen for the patient is scheduled for the appropriate day and plan matches scheduled date. [name] Appropriate non-routine labs are ordered dependent on drug ordered. . If applicable, additional medications reviewed and ordered per protocol based on lifetime cumulative doses and/or treatment regimen.   Plan for follow-up and/or issues identified: No . I-vent associated with next due treatment: No . MD and/or nursing notified: No   [facility], Pharm.D., CPP 08/29/2019@12 :16 PM

## 2019-08-31 ENCOUNTER — Encounter: Payer: Self-pay | Admitting: Oncology

## 2019-08-31 DIAGNOSIS — R7989 Other specified abnormal findings of blood chemistry: Secondary | ICD-10-CM | POA: Diagnosis not present

## 2019-08-31 DIAGNOSIS — E038 Other specified hypothyroidism: Secondary | ICD-10-CM | POA: Diagnosis not present

## 2019-08-31 DIAGNOSIS — Z0000 Encounter for general adult medical examination without abnormal findings: Secondary | ICD-10-CM | POA: Diagnosis not present

## 2019-08-31 DIAGNOSIS — R002 Palpitations: Secondary | ICD-10-CM | POA: Diagnosis not present

## 2019-08-31 DIAGNOSIS — Z125 Encounter for screening for malignant neoplasm of prostate: Secondary | ICD-10-CM | POA: Diagnosis not present

## 2019-09-01 ENCOUNTER — Other Ambulatory Visit: Payer: Self-pay

## 2019-09-01 ENCOUNTER — Other Ambulatory Visit: Payer: Medicare PPO | Admitting: Internal Medicine

## 2019-09-01 DIAGNOSIS — C679 Malignant neoplasm of bladder, unspecified: Secondary | ICD-10-CM

## 2019-09-01 DIAGNOSIS — Z515 Encounter for palliative care: Secondary | ICD-10-CM | POA: Diagnosis not present

## 2019-09-01 NOTE — Progress Notes (Signed)
[facility] Consult Note Telephone: [phone]  Fax: [phone]  PATIENT NAME: [facility] DOB: 09/25/1945 MRN: [id number]  PRIMARY CARE PROVIDER:   [name], MD  REFERRING PROVIDER:  Dr. Alen Blew  RESPONSIBLE PARTY:   Self and wife     RECOMMENDATIONS and PLAN:  Palliative Care Encounter  Z51.5  1.  Advance Care Planning: Goals remain as to maintain normal daily activities and social life as long as possible.  He does not want to feel drugged with use of opiods to manage cancer related pain.  MOST and DNR forms completed today.   Pt. desires comfort measures, antibiotics if indicated band, no tube feeding. No selection for IV fluids  Scheduled rest.  Activities as tolerated.  Palliative care will f/u with patient and wife needed in the future per his request. Support given.  2.  Cancer related pain:  Partially controlled with use of Ibuprofen 200mg  every 4 hrs.Worse pain in early afternoon at a rate of 7/10. Increase Ibuprofen dose to 400 or 600mg  every 8 hours.  Max dose is 800mg  every 8 hours. Snack prior to Ibuprofen use.  Morphine on hand if needed for severe pain.  Continue to monitor  3.  Constipation:  Improved  4.  Epilepsy:  Managed with use of Clonazepam and Zonegran.  Continue follow-up with Neurologist.  Monitor  I spent 45 minutes providing this consultation,  from 1030 to 1115. More than 50% of the time in this consultation was spent coordinating communication with patient and wife.  HISTORY OF PRESENT ILLNESS:  [facility] is a 74 y.o. year old male with multiple medical problems including epilepsy, metastatic bladder cancer to back and liver. He reports some decrease in energy but still has a good appetite.  He is able to perform his own ADLs.  He has begun receiving palliative chemotherapy to decrease symptoms.  Palliative Care was asked to help address goals of care.   CODE STATUS: DNAR  PPS: 50% HOSPICE  ELIGIBILITY/DIAGNOSIS: YES/Metastatic bladder cancer but is currently receiving chemotherapy  PAST MEDICAL HISTORY:  Past Medical History:  Diagnosis Date  . Anxiety disorder   . Bladder cancer [facility]) urologist-- dr Alyson Ingles ([facility]) and dr Lawerance Bach [facility]):  oncologist-  dr Alen Blew ([facility]) & dr Marcello Moores [facility])   dx 2018--- s/p  TURBT's ;  hx BCG tx's.  chemotherapy completed 2019, and immunotherapy Keytruda , last one 10-21-2018  . BPH (benign prostatic hypertrophy) with urinary obstruction   . History of adenomatous polyp of colon   . History of closed head injury    2000-- fell off ladder--  temporary memory loss resolved  . History of psychosis    x2  last one documented 2006  w/ hallucination  and suicide ideation  . Nocturia   . Port-A-Cath in place 2018  . S/P placement of VNS (vagus nerve stimulation) device 01/28/2018   per pt swipe magnet twice daily  . Sigmoid diverticulosis   . Sinus bradycardia seen on cardiac monitor    cardiology --  dr g. taylor;   event monitor results 11-15-2018 in epic,  NSR w/ ST and SB and a nocturnal pause   . Temporal lobe epilepsy syndrome [facility]) neurologist-  dr Raliegh Ip. Delice Lesch-  avergae one every 2 weeks "legs jerking around, per wife , pt unaware he's doing this"   localization-related right temporal lobe --  mostly nocturnal w/ bilateral lower extremitity movement, staring and moaning then dissorietation  afterwards (12-02-2018 per pt last daytime seizure 11-11-2018 and last nighttime seizure 11-29-2018  . Vesicoureteral reflux, bilateral   . Wears glasses     SOCIAL HX: Lives at home with wife   PERTINENT MEDICATIONS:  Outpatient Encounter Medications as of 08/18/2019  Medication Sig  . cloBAZam (ONFI) 10 MG tablet Take 1 tablet (10 mg total) by mouth at bedtime.  . clonazePAM (KLONOPIN) 0.5 MG tablet Take 0.5 mg by mouth 2 (two) times daily as needed (For seizures per wife).   [name] dexamethasone (DECADRON) 4 MG tablet Take 1 tablet (4  mg total) by mouth 2 (two) times daily with a meal.  . levothyroxine (SYNTHROID) 125 MCG tablet Take 125 mcg by mouth daily before breakfast.  . loperamide (IMODIUM A-D) 2 MG tablet Take 2 mg by mouth 4 (four) times daily as needed for diarrhea or loose stools.  . Multiple Vitamin (MULTIVITAMIN WITH MINERALS) TABS tablet Take 1 tablet by mouth in the morning and at bedtime.   . ondansetron (ZOFRAN ODT) 4 MG disintegrating tablet 4mg  ODT q4 hours prn nausea/vomit  . polyethylene glycol (MIRALAX / GLYCOLAX) 17 g packet Take 17 g by mouth daily. (Patient taking differently: Take 17 g by mouth daily as needed for moderate constipation. )  . prochlorperazine (COMPAZINE) 10 MG tablet Take 1 tablet (10 mg total) by mouth every 6 (six) hours as needed for nausea or vomiting.  . senna (SENOKOT) 8.6 MG TABS tablet Take 2 tablets (17.2 mg total) by mouth 2 (two) times daily.  [name] zonisamide (ZONEGRAN) 100 MG capsule TAKE 3 CAPSULES EACH NIGHT (Patient taking differently: Take 300 mg by mouth at bedtime. )   No facility-administered encounter medications on file as of 08/18/2019.    PHYSICAL EXAM:   General: NAD.  Small framed elderly male Cardiovascular:RRR Pulmonary:voice is hoarse,  Clear throughout Abd:  Soft and non-distended.  Active BS Ext:  No edema.  Steady gait Neurological: Alert and oriented x3,  Some weakness Psych:  Good spirits  Gonzella Lex, NP-C

## 2019-09-02 ENCOUNTER — Inpatient Hospital Stay: Payer: Medicare PPO

## 2019-09-02 ENCOUNTER — Other Ambulatory Visit: Payer: Self-pay

## 2019-09-02 ENCOUNTER — Inpatient Hospital Stay: Payer: Medicare PPO | Attending: Oncology

## 2019-09-02 ENCOUNTER — Inpatient Hospital Stay: Payer: Medicare PPO | Admitting: Oncology

## 2019-09-02 VITALS — BP 113/70 | HR 96 | Temp 98.3°F | Resp 20 | Ht 68.0 in | Wt 119.3 lb

## 2019-09-02 DIAGNOSIS — Z95828 Presence of other vascular implants and grafts: Secondary | ICD-10-CM

## 2019-09-02 DIAGNOSIS — Z5111 Encounter for antineoplastic chemotherapy: Secondary | ICD-10-CM | POA: Insufficient documentation

## 2019-09-02 DIAGNOSIS — C679 Malignant neoplasm of bladder, unspecified: Secondary | ICD-10-CM | POA: Insufficient documentation

## 2019-09-02 DIAGNOSIS — C787 Secondary malignant neoplasm of liver and intrahepatic bile duct: Secondary | ICD-10-CM | POA: Diagnosis not present

## 2019-09-02 DIAGNOSIS — D6481 Anemia due to antineoplastic chemotherapy: Secondary | ICD-10-CM | POA: Insufficient documentation

## 2019-09-02 DIAGNOSIS — G893 Neoplasm related pain (acute) (chronic): Secondary | ICD-10-CM | POA: Diagnosis not present

## 2019-09-02 DIAGNOSIS — C674 Malignant neoplasm of posterior wall of bladder: Secondary | ICD-10-CM

## 2019-09-02 LAB — CBC WITH DIFFERENTIAL (CANCER CENTER ONLY)
Abs Immature Granulocytes: 0.05 10*3/uL (ref 0.00–0.07)
Basophils Absolute: 0 10*3/uL (ref 0.0–0.1)
Basophils Relative: 1 %
Eosinophils Absolute: 0.2 10*3/uL (ref 0.0–0.5)
Eosinophils Relative: 6 %
HCT: 30.5 % — ABNORMAL LOW (ref 39.0–52.0)
Hemoglobin: 9.5 g/dL — ABNORMAL LOW (ref 13.0–17.0)
Immature Granulocytes: 2 %
Lymphocytes Relative: 23 %
Lymphs Abs: 0.8 10*3/uL (ref 0.7–4.0)
MCH: 26.2 pg (ref 26.0–34.0)
MCHC: 31.1 g/dL (ref 30.0–36.0)
MCV: 84.3 fL (ref 80.0–100.0)
Monocytes Absolute: 0.5 10*3/uL (ref 0.1–1.0)
Monocytes Relative: 17 %
Neutro Abs: 1.7 10*3/uL (ref 1.7–7.7)
Neutrophils Relative %: 51 %
Platelet Count: 594 10*3/uL — ABNORMAL HIGH (ref 150–400)
RBC: 3.62 MIL/uL — ABNORMAL LOW (ref 4.22–5.81)
RDW: 18.8 % — ABNORMAL HIGH (ref 11.5–15.5)
WBC Count: 3.3 10*3/uL — ABNORMAL LOW (ref 4.0–10.5)
nRBC: 0 % (ref 0.0–0.2)

## 2019-09-02 LAB — CMP (CANCER CENTER ONLY)
ALT: 23 U/L (ref 0–44)
AST: 24 U/L (ref 15–41)
Albumin: 3 g/dL — ABNORMAL LOW (ref 3.5–5.0)
Alkaline Phosphatase: 249 U/L — ABNORMAL HIGH (ref 38–126)
Anion gap: 11 (ref 5–15)
BUN: 25 mg/dL — ABNORMAL HIGH (ref 8–23)
CO2: 23 mmol/L (ref 22–32)
Calcium: 8.8 mg/dL — ABNORMAL LOW (ref 8.9–10.3)
Chloride: 109 mmol/L (ref 98–111)
Creatinine: 0.83 mg/dL (ref 0.61–1.24)
GFR, Est AFR Am: >60 mL/min (ref >60)
GFR, Estimated: >60 mL/min (ref >60)
Glucose, Bld: 103 mg/dL — ABNORMAL HIGH (ref 70–99)
Potassium: 4.4 mmol/L (ref 3.5–5.1)
Sodium: 143 mmol/L (ref 135–145)
Total Bilirubin: <0.2 mg/dL — ABNORMAL LOW (ref 0.3–1.2)
Total Protein: 6.2 g/dL — ABNORMAL LOW (ref 6.5–8.1)

## 2019-09-02 MED ORDER — PALONOSETRON HCL INJECTION 0.25 MG/5ML
INTRAVENOUS | Status: AC
  Filled 2019-09-02: qty 5

## 2019-09-02 MED ORDER — SODIUM CHLORIDE 0.9 % IV SOLN
150.0000 mg | Freq: Once | INTRAVENOUS | Status: AC
  Administered 2019-09-02: 11:00:00 150 mg via INTRAVENOUS
  Filled 2019-09-02: qty 150

## 2019-09-02 MED ORDER — SODIUM CHLORIDE 0.9 % IV SOLN
1000.0000 mg/m2 | Freq: Once | INTRAVENOUS | Status: AC
  Administered 2019-09-02: 12:00:00 1634 mg via INTRAVENOUS
  Filled 2019-09-02: qty 42.98

## 2019-09-02 MED ORDER — SODIUM CHLORIDE 0.9 % IV SOLN
Freq: Once | INTRAVENOUS | Status: AC
  Filled 2019-09-02: qty 250

## 2019-09-02 MED ORDER — HEPARIN SOD (PORK) LOCK FLUSH 100 UNIT/ML IV SOLN
500.0000 [IU] | Freq: Once | INTRAVENOUS | Status: AC | PRN
  Administered 2019-09-02: 13:00:00 500 [IU]
  Filled 2019-09-02: qty 5

## 2019-09-02 MED ORDER — SODIUM CHLORIDE 0.9 % IV SOLN
10.0000 mg | Freq: Once | INTRAVENOUS | Status: AC
  Administered 2019-09-02: 11:00:00 10 mg via INTRAVENOUS
  Filled 2019-09-02: qty 10

## 2019-09-02 MED ORDER — SODIUM CHLORIDE 0.9% FLUSH
10.0000 mL | Freq: Once | INTRAVENOUS | Status: AC
  Administered 2019-09-02: 10 mL
  Filled 2019-09-02: qty 10

## 2019-09-02 MED ORDER — PALONOSETRON HCL INJECTION 0.25 MG/5ML
0.2500 mg | Freq: Once | INTRAVENOUS | Status: AC
  Administered 2019-09-02: 0.25 mg via INTRAVENOUS

## 2019-09-02 MED ORDER — SODIUM CHLORIDE 0.9 % IV SOLN
367.0000 mg | Freq: Once | INTRAVENOUS | Status: AC
  Administered 2019-09-02: 370 mg via INTRAVENOUS
  Filled 2019-09-02: qty 37

## 2019-09-02 MED ORDER — SODIUM CHLORIDE 0.9% FLUSH
10.0000 mL | INTRAVENOUS | Status: DC | PRN
  Administered 2019-09-02: 13:00:00 10 mL
  Filled 2019-09-02: qty 10

## 2019-09-02 NOTE — Progress Notes (Signed)
Hematology and Oncology Follow Up   ALAKI ROSEL [id number] 1946/03/30 74 y.o. 09/02/2019 9:47 AM Avva, [name], MD      Principle Diagnosis: 74 year old man with stage IV bladder cancer documented in April 2021 with hepatic disease.  He was initially diagnosed with localized disease in 2018.    Prior Therapy:  He is S/P  neoadjuvant chemotherapy in preparation for radical cystectomy utilizing MVAC for 4 cycles under the care of Dr. Marcello Moores at [facility].  He subsequently declined cystectomy for personal reasons.    He remained disease free till January 2020 where he has recurrence of non-muscle invasive disease.  He was started on Pembrolizumab in March 2020 after declining cystectomy.    Repeat TURBT in April 2020 showed persistent high-grade noninvasive papillary urothelial carcinoma.  He underwent BCG treatment in May 2020 under the care of Dr. Alyson Ingles.  Repeat TURBT in August 2020 showed muscle invasive disease without any evidence of metastatic disease based on imaging studies in June 2020.  He is status post radical cystectomy completed on February 25, 2019 with T3a N0 disease.   Current therapy: Gemcitabine and carboplatin started on August 12, 2019.  He is here for day 1 of cycle 2 of therapy.  Interim History: Mr. Fier presents today for a repeat evaluation.  Since the last visit, he completed cycle 1 of carboplatin and gemcitabine chemotherapy.  He tolerated the conclusion of that cycle without any major complications.  Denies any nausea, vomiting or abdominal pain.  He denies any excessive fatigue or tiredness.  He is reporting reasonable control of his bone pain and only takes ibuprofen periodically.  He denies any falls or syncope.  His quality of life remains maintained at this time.     Medications: Reviewed without changes. Current Outpatient Medications  Medication Sig Dispense Refill  . cloBAZam (ONFI) 10 MG tablet  Take 1 tablet (10 mg total) by mouth at bedtime. 30 tablet 0  . clonazePAM (KLONOPIN) 0.5 MG tablet Take 0.5 mg by mouth 2 (two) times daily as needed (For seizures per wife).     [name] dexamethasone (DECADRON) 4 MG tablet Take 1 tablet (4 mg total) by mouth 2 (two) times daily with a meal. 30 tablet 0  . levothyroxine (SYNTHROID) 125 MCG tablet Take 125 mcg by mouth daily before breakfast.    . loperamide (IMODIUM A-D) 2 MG tablet Take 2 mg by mouth 4 (four) times daily as needed for diarrhea or loose stools.    . Multiple Vitamin (MULTIVITAMIN WITH MINERALS) TABS tablet Take 1 tablet by mouth in the morning and at bedtime.     . ondansetron (ZOFRAN ODT) 4 MG disintegrating tablet 4mg  ODT q4 hours prn nausea/vomit 10 tablet 0  . polyethylene glycol (MIRALAX / GLYCOLAX) 17 g packet Take 17 g by mouth daily. (Patient taking differently: Take 17 g by mouth daily as needed for moderate constipation. ) 14 each 0  . prochlorperazine (COMPAZINE) 10 MG tablet Take 1 tablet (10 mg total) by mouth every 6 (six) hours as needed for nausea or vomiting. 30 tablet 0  . senna (SENOKOT) 8.6 MG TABS tablet Take 2 tablets (17.2 mg total) by mouth 2 (two) times daily. 120 tablet 0  . zonisamide (ZONEGRAN) 100 MG capsule TAKE 3 CAPSULES EACH NIGHT (Patient taking differently: Take 300 mg by mouth at bedtime. ) 270 capsule 3   No current facility-administered medications for this visit.     Allergies:  Allergies  Allergen Reactions  . Chlorhexidine Rash    Full body  . Povidone-Iodine Rash     Physical exam:  Blood pressure 113/70, pulse 96, temperature 98.3 F (36.8 C), temperature source Temporal, resp. rate 20, height 5\' 8"  (1.727 m), weight 119 lb 4.8 oz (54.1 kg), SpO2 100 %.   ECOG 1     General appearance: Alert, awake without any distress. Head: Atraumatic without abnormalities Oropharynx: Without any thrush or ulcers. Eyes: No scleral icterus. Lymph nodes: No lymphadenopathy noted in the  cervical, supraclavicular, or axillary nodes Heart:regular rate and rhythm, without any murmurs or gallops.   Lung: Clear to auscultation without any rhonchi, wheezes or dullness to percussion. Abdomin: Soft, nontender without any shifting dullness or ascites. Musculoskeletal: No clubbing or cyanosis. Neurological: No motor or sensory deficits. Skin: No rashes or lesions.     Lab Results: Lab Results  Component Value Date   WBC 6.0 08/19/2019   HGB 10.2 (L) 08/19/2019   HCT 32.8 (L) 08/19/2019   MCV 82.4 08/19/2019   PLT 172 08/19/2019     Chemistry      Component Value Date/Time   NA 144 08/19/2019 0857   K 4.0 08/19/2019 0857   CL 111 08/19/2019 0857   CO2 21 (L) 08/19/2019 0857   BUN 27 (H) 08/19/2019 0857   CREATININE 0.82 08/19/2019 0857      Component Value Date/Time   CALCIUM 8.6 (L) 08/19/2019 0857   ALKPHOS 153 (H) 08/19/2019 0857   AST 30 08/19/2019 0857   ALT 42 08/19/2019 0857   BILITOT 0.2 (L) 08/19/2019 0857       Impression and Plan:  74 year old man with:  1.  Stage IV bladder cancer with hepatic involvement documented in April 2021.    He completed the first cycle of chemotherapy utilizing carboplatin and gemcitabine without any major complications.  Risks and benefits of continuing this treatment were reviewed today.  Potential complications include nausea, vomiting, myelosuppression, neutropenia as well as possible sepsis.  The plan is to repeat imaging studies after completing 3 cycles of therapy.  Alternative options will be used only if he has disease progression including immunotherapy and Padcev.  2.  IV access: Port-A-Cath currently in use without any issues.  3.  Antiemetics: No nausea or vomiting reported at this time.  Compazine is available to him.  4.  Prognosis and goals of care: Therapy is palliative at this time.  Aggressive measures are warranted given his continued reasonable performance status.  5.  Pain: Related to his disease  and metastasis.  Pain is overall reasonably controlled.  6.  Follow-up: He will return in 1 week for day 8 of the current cycle in 3 weeks for the start of the next cycle.    30  minutes were dedicated to this encounter.  The time was spent on updating his disease status including complications related to his cancer and cancer therapy and discussing about future plan of care.  Zola Button, MD 09/02/2019 9:47 AM

## 2019-09-02 NOTE — Patient Instructions (Signed)
[facility] Discharge Instructions for Patients Receiving Chemotherapy  Today you received the following chemotherapy agents: Gemzar, Carboplatin   To help prevent nausea and vomiting after your treatment, we encourage you to take your nausea medication as directed.   If you develop nausea and vomiting that is not controlled by your nausea medication, call the clinic.   BELOW ARE SYMPTOMS THAT SHOULD BE REPORTED IMMEDIATELY:  *FEVER GREATER THAN 100.5 F  *CHILLS WITH OR WITHOUT FEVER  NAUSEA AND VOMITING THAT IS NOT CONTROLLED WITH YOUR NAUSEA MEDICATION  *UNUSUAL SHORTNESS OF BREATH  *UNUSUAL BRUISING OR BLEEDING  TENDERNESS IN MOUTH AND THROAT WITH OR WITHOUT PRESENCE OF ULCERS  *URINARY PROBLEMS  *BOWEL PROBLEMS  UNUSUAL RASH Items with * indicate a potential emergency and should be followed up as soon as possible.  Feel free to call the clinic should you have any questions or concerns. The clinic phone number is [phone].  Please show the CHEMO ALERT CARD at check-in to the Emergency Department and triage nurse.   

## 2019-09-05 ENCOUNTER — Encounter: Payer: Self-pay | Admitting: Neurology

## 2019-09-05 ENCOUNTER — Other Ambulatory Visit: Payer: Self-pay

## 2019-09-05 ENCOUNTER — Ambulatory Visit: Payer: Medicare PPO | Admitting: Neurology

## 2019-09-05 VITALS — BP 104/66 | HR 88 | Resp 20 | Ht 68.0 in | Wt 119.0 lb

## 2019-09-05 DIAGNOSIS — G40219 Localization-related (focal) (partial) symptomatic epilepsy and epileptic syndromes with complex partial seizures, intractable, without status epilepticus: Secondary | ICD-10-CM | POA: Diagnosis not present

## 2019-09-05 MED ORDER — CLONAZEPAM 0.5 MG PO TABS: ORAL_TABLET | ORAL | 5 refills | Status: AC

## 2019-09-05 MED ORDER — CLOBAZAM 10 MG PO TABS: ORAL_TABLET | ORAL | 3 refills | Status: DC

## 2019-09-05 MED ORDER — ZONISAMIDE 100 MG PO CAPS: ORAL_CAPSULE | ORAL | 3 refills | Status: DC

## 2019-09-05 NOTE — Patient Instructions (Signed)
Good to see you. Continue clobazam 10mg  every night and zonisamide 300mg  every night. Follow-up in 4 months, call for any changes.  Seizure Precautions: 1. If medication has been prescribed for you to prevent seizures, take it exactly as directed.  Do not stop taking the medicine without talking to your doctor first, even if you have not had a seizure in a long time.   2. Avoid activities in which a seizure would cause danger to yourself or to others.  Don't operate dangerous machinery, swim alone, or climb in high or dangerous places, such as on ladders, roofs, or girders.  Do not drive unless your doctor says you may.  3. If you have any warning that you may have a seizure, lay down in a safe place where you can't hurt yourself.    4.  No driving for 6 months from last seizure, as per [facility].   Please refer to the following link on the [place] website for more information: http://[email or web address].org/[email or web address]/driving/drivingu.cfm   5.  Maintain good sleep hygiene.   6.  Contact your doctor if you have any problems that may be related to the medicine you are taking.  7.  Call 911 and bring the patient back to the ED if:        A.  The seizure lasts longer than 5 minutes.       B.  The patient doesn't awaken shortly after the seizure  C.  The patient has new problems such as difficulty seeing, speaking or moving  D.  The patient was injured during the seizure  E.  The patient has a temperature over 102 F (39C)  F.  The patient vomited and now is having trouble breathing

## 2019-09-05 NOTE — Progress Notes (Signed)
NEUROLOGY FOLLOW UP OFFICE NOTE  John Holt [id number] [birth year]  HISTORY OF PRESENT ILLNESS: I had the pleasure of seeing John Holt in follow-up in the neurology clinic on 09/05/2019.  The patient was last seen 3 months ago for intractable epilepsy. He is again accompanied by his wife John Holt who helps supplement the history today.  Records and images were personally reviewed where available.  On his last visit, clobazam dose was reduced to 2.5mg  qhs, however he started having more seizures and dose was increased back to 5mg  qhs in March. He then started having a significant increase in seizures with post-ictal psychosis. He also started having significant back pain. He was found to have metastases to the spine and liver. He was started on Decadron and is now on chemotherapy with gemcitabine and carboplatin for the past month. He is back on clobazam 10mg  qhs in addition to Zonisamide 300mg  qhs. He and his wife deny any seizures since 07/31/19, at that time he had a fall and hit his head. Head CT no acute changes. He denies any headaches, dizziness, focal numbness/tingling/weakness. He still has some back pain, sometimes waking him up at night. He takes prn Ibuprofen. They both report his mood is pretty good.   History on Initial Assessment 05/13/2017: This is a very pleasant 74 yo RH man with a history of focal epilepsy of right mesial temporal or right mesial frontal onset. He had been seeing epileptologist Dr. Jacelyn Holt for several years, records were reviewed. He reports staring episodes in childhood. In his 74s, he recalls having episodes of a sinking feeling in his stomach with deja vu occurring around once a month initially. When he was started on seizure medications, these episodes occur rarely, around 1-2 a year. His wife started noticing possible symptoms in his 76s, they were in a meeting and she noticed him swaying back and forth but seemed unaware of it. He started having nocturnal spells more  than 15 years ago, where he would have "leg thrusting" (bicycling type leg movements), lip smacking, often followed by arousal with disorientation, need to urinate and walking around (at times resembling sleepwalking). He has had episodes where he would have a cluster of seizures then become paranoid and manic with significant post-ictal psychosis. He usually has nocturnal seizures every 3 weeks or so. His wife would give him prn clonazepam when he has clusters, and this has helped "zap" them. He has not needed clonazepam in at least 1.5 years. He has rare daytime seizures, his wife recalls the seizures in 2006 and 2013 with post-ictal psychosis, there is note of a spell of undressing and confusion in the [employer] at [place] in 2015. In the past he has been on Lamictal, Depakote, Gabapentin. Gabapentin has been tapered off. He has been taking Onfi for several years and feels this has been the most helpful. He was recently diagnosed with bladder cancer in October 2018 and underwent resection and chemotherapy. They report that towards the end of chemo, he had some incidents with seizures. He has no recollection of the events. His wife reports that after he had his second infusion and the PICC line was taken out, he started having slight feet movements and was staring and unresponsive. He had another episode while shoveling snow, he came in and started talking and his speech became garbled, "words were not right" for 60 seconds. Three days after on the way home from chemo, he had a similar episode with his speech. He was talking  about Aristotle then his speech became garbled. The last daytime event was on 04/28/17. He started swaying side to side, speech was garbled, and he was staring off for a few minutes. Due to these daytime episodes, his Onfi dose was temporarily increased to 40mg  qhs. They report he has not had any nocturnal seizures since November, which is unusual for him. The higher dose of Onfi knocks him  out. They report he is done with chemotherapy and will be seeing his oncologist to discuss next steps.  He denies any headaches, dizziness, diplopia, dysarthria/dysphagia, neck/back pain, focal numbness/tingling/weakness, bowel/bladder dysfunction, no falls. He had muscle cramps, taking magnesium supplements has helped. His memory is what bothers him a lot. They mostly notice problems with his "narrative memory." He would not recall watching a movie 2-3 weeks prior. He cannot recall anecdotes. He would make notes on an article, and later find that he had already made notes on the same article previously. He is able to remember abstract concepts from dense philosophy books he reads. His spatial memory is not good. He denies missing medications. Every once in a while he forgets his medications (twice in the past month). He drives only to the grocery and denies getting lost. He has had Neuropsychological testing, report unavailable for review.  Update 12/23/2017: On his last visit, he reported feeling bad, more depressed about his memory, and felt Onfi was contributing a lot to his symptoms. He had a seizure on 09/01/17 during wakefulness. He had a repeat MRI brain on 10/14/17 which showed asymmetric right hippocampal volume loss suggestive of mesial temporal sclerosis. He was started on Zonisamide and did very well with no seizures for 9 weeks (baseline nocturnal seizures every 2 weeks or so). He reduced Onfi to 20mg  and his wife reported he was doing great, his sense of humor was back, he was laughing, at one point his wife thought the euphoria was almost too much. She noticed this as a big difference from how he was the past couple of years where he had a "flattening" of mood. Unfortunately, after he reduced the Onfi to 10mg  on 12/02/17, his wife noticed almost immediately that his mood was a little darker and he was more irritable. He had a nocturnal seizure on 8/9, then another on 8/14. With each seizure, his mood  was going "way, way down." He was instructed in increase Zonisamide to 400mg  qhs. He then had a seizure during the afternoon of 12/18/17. He is tearful in the office today and has some difficulty finding his words, reporting it was the end of a stressful week, he had been having feelings of anxiety that week. He had no warning and is amnestic of the seizure, his wife reports he clasped his hands together in front of him and was doing repetitive digging movements, unresponsive. He then smiled at her and grabbed her arms, jerking her around like he was dancing with her. This lasted 2-3 minutes, then he started answering questions. No focal weakness, tongue bite, or incontinence. He called our office and was instructed to increase Onfi back to 20mg  daily and Zonisamide down to 300mg  qhs. He has felt normal this week. He has had tingling in his feet with the Zonisamide, this has quieted down but not completely gone away. He denies any headaches, dizziness, vision changes, focal weakness, no falls.   Prior AEDs: Depakote, Lamictal, Tegretol, Trileptal, Keppra, Vimpat, Gabapentin  Epilepsy Risk Factors: His father had rare spells 1-2 times a year of "zonking out."  He and his brother had "spasms" as babies, none after age 72/3. He recalls having a head injury in his teenage years. Otherwise he had a normal birth and early development, no history of febrile convulsions, CNS infections, or neurosurgical procedures.   Prior workup: MRI brain 01/2014: subtle findings consistent with right mesial temporal sclerosis EEGs: EEG 2009 - right temporal spike activity and right TIRDA EEG 01/2009 - normal EEG 07/2012 - normal  MRI 2006 - normal  PET Brain 2016 - right mesial temporal hypometabolism.  EMU 04/25/2014 - 05/04/2014 6 events captured on camera, 1 off camera - repeated neck extension and flexion, followed by pelvic thrusting and body rocking movements. Appear to localize to the right temporal region,  although clinically suggestive of frontal onset. Patient taken off Depakote and Lamictal and switched to high dose gabapentin  Neuropsychological testing in June 2019 indicated mild neurocognitive disorder due to seizure disorder. Testing and daily functioning not consistent with dementia. Testing revealed mostly normal cognitive functioning. However, he did demonstrate mildly reduced visual-spatial construction and more impaired visual memory, consistent with right hemisphere involvement and in particular mesial temporal lobe involvement. He also demonstrated impairment in verbal memory of non-contextual information, but memory of contextual information was intact, suggesting more of a problem with frontal-subcortical networks than hippocampal consolidation dysfunction.   PAST MEDICAL HISTORY: Past Medical History:  Diagnosis Date  . Anxiety disorder   . Bladder cancer [facility]) urologist-- dr Alyson Ingles ([facility]) and dr Lawerance Bach [facility]):  oncologist-  dr Alen Blew ([facility]) & dr Marcello Moores [facility])   dx 2018--- s/p  TURBT's ;  hx BCG tx's.  chemotherapy completed 2019, and immunotherapy Keytruda , last one 10-21-2018  . BPH (benign prostatic hypertrophy) with urinary obstruction   . History of adenomatous polyp of colon   . History of closed head injury    2000-- fell off ladder--  temporary memory loss resolved  . History of psychosis    x2  last one documented 2006  w/ hallucination  and suicide ideation  . Nocturia   . Port-A-Cath in place 2018  . S/P placement of VNS (vagus nerve stimulation) device 01/28/2018   per pt swipe magnet twice daily  . Sigmoid diverticulosis   . Sinus bradycardia seen on cardiac monitor    cardiology --  dr g. taylor;   event monitor results 11-15-2018 in epic,  NSR w/ ST and SB and a nocturnal pause   . Temporal lobe epilepsy syndrome [facility]) neurologist-  dr Raliegh Ip. Delice Lesch-  avergae one every 2 weeks "legs jerking around, per wife , pt unaware he's doing this"    localization-related right temporal lobe --  mostly nocturnal w/ bilateral lower extremitity movement, staring and moaning then dissorietation afterwards (12-02-2018 per pt last daytime seizure 11-11-2018 and last nighttime seizure 11-29-2018  . Vesicoureteral reflux, bilateral   . Wears glasses     MEDICATIONS: Current Outpatient Medications on File Prior to Visit  Medication Sig Dispense Refill  . dexamethasone (DECADRON) 4 MG tablet Take 1 tablet (4 mg total) by mouth 2 (two) times daily with a meal. 30 tablet 0  . levothyroxine (SYNTHROID) 125 MCG tablet Take 125 mcg by mouth daily before breakfast.    . loperamide (IMODIUM A-D) 2 MG tablet Take 2 mg by mouth 4 (four) times daily as needed for diarrhea or loose stools.    . Multiple Vitamin (MULTIVITAMIN WITH MINERALS) TABS tablet Take 1 tablet by mouth in the morning and at bedtime.     [name]  ondansetron (ZOFRAN ODT) 4 MG disintegrating tablet 4mg  ODT q4 hours prn nausea/vomit 10 tablet 0  . polyethylene glycol (MIRALAX / GLYCOLAX) 17 g packet Take 17 g by mouth daily. (Patient taking differently: Take 17 g by mouth daily as needed for moderate constipation. ) 14 each 0  . prochlorperazine (COMPAZINE) 10 MG tablet Take 1 tablet (10 mg total) by mouth every 6 (six) hours as needed for nausea or vomiting. 30 tablet 0  . senna (SENOKOT) 8.6 MG TABS tablet Take 2 tablets (17.2 mg total) by mouth 2 (two) times daily. 120 tablet 0   No current facility-administered medications on file prior to visit.    ALLERGIES: Allergies  Allergen Reactions  . Chlorhexidine Rash    Full body  . Povidone-Iodine Rash    FAMILY HISTORY: Family History  Problem Relation Age of Onset  . Seizures Father   . Heart disease Father   . Cancer Father        prostat  . Cancer Mother        stomach  . Stomach cancer Mother   . Colon cancer Neg Hx   . Colon polyps Neg Hx   . Esophageal cancer Neg Hx   . Rectal cancer Neg Hx     SOCIAL HISTORY: Social  History   Socioeconomic History  . Marital status: Married    Spouse name: [name]  . Number of children: 2  . Years of education: PhD  . 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 Smoking status: Former Smoker    Packs/day: 1.00    Years: 15.00    Pack years: 15.00    Types: Cigarettes    Quit date: 04/03/1972    Years since quitting: 47.4  . Smokeless tobacco: Never Used  Substance and Sexual Activity  . Alcohol use: Yes    Alcohol/week: 14.0 standard drinks    Types: 14 Glasses of wine per week    Comment: 1 or 2  wine daily; no consumption in last 24 hours  . Drug use: No  . Sexual activity: Yes    Partners: Female  Other Topics Concern  . Not on file  Social History Narrative   Pt lives at home with his wife. One story home.      Left handed      Phd in English      Caffeine Use- 2 cups daily   Social Determinants of Health   Financial Resource Strain:   . Difficulty of Paying Living Expenses:   Food Insecurity:   . Worried About Charity fundraiser in the Last Year:   . Arboriculturist in the Last Year:   Transportation Needs:   . Film/video editor (Medical):   [name] Lack of Transportation (Non-Medical):   Physical Activity:   . Days of Exercise per Week:   . Minutes of Exercise per Session:   Stress:   . Feeling of Stress :   Social Connections:   . Frequency of Communication with Friends and Family:   . Frequency of Social Gatherings with Friends and Family:   . Attends Religious Services:   . Active Member of Clubs or Organizations:   . Attends Archivist Meetings:   [name] Marital Status:   Intimate Partner Violence:   . Fear of Current or Ex-Partner:   . Emotionally Abused:   .  Physically Abused:   . Sexually Abused:     REVIEW OF SYSTEMS: Constitutional: No fevers, chills, or sweats, no generalized fatigue, change in appetite Eyes:  No visual changes, double vision, eye pain Ear, nose and throat: No hearing loss, ear pain, nasal congestion, sore throat Cardiovascular: No chest pain, palpitations Respiratory:  No shortness of breath at rest or with exertion, wheezes GastrointestinaI: No nausea, vomiting, diarrhea, abdominal pain, fecal incontinence Genitourinary:  No dysuria, urinary retention or frequency Musculoskeletal:  No neck pain, back pain Integumentary: No rash, pruritus, skin lesions Neurological: as above Psychiatric: No depression, insomnia, anxiety Endocrine: No palpitations, fatigue, diaphoresis, mood swings, change in appetite, change in weight, increased thirst Hematologic/Lymphatic:  No anemia, purpura, petechiae. Allergic/Immunologic: no itchy/runny eyes, nasal congestion, recent allergic reactions, rashes  PHYSICAL EXAM: Vitals:   09/05/19 1429  BP: 104/66  Pulse: 88  Resp: 20  SpO2: 98%   General: No acute distress Head:  Normocephalic/atraumatic Skin/Extremities: No rash, no edema Neurological Exam: alert and oriented to person, place, and time. No aphasia or dysarthria. Fund of knowledge is appropriate.  Recent and remote memory are intact.  Attention and concentration are normal. Cranial nerves: Pupils equal, round, reactive to light.  Extraocular movements intact with no nystagmus. Visual fields full. No facial asymmetry.   Motor: Bulk and tone normal, muscle strength 5/5 throughout with no pronator drift. Finger to nose testing intact.  Gait narrow-based and steady, able to tandem walk adequately.   VNS Therapy Management: Parameters Output Current (mA): 1.75 Signal Frequency (Hz): 20 Pulse Width (usec): 250 Signal ON Time (sec): 30 Signal OFF Time (min): 3 Magnet Output Current (mA): 2 Magnet ON Time (sec): 60 Magnet Pulse Width (usec): 250 AutoStim Output Current (mA): 1.875 AutoStim Pulse Width (usec): 250 AutoStim ON Time (sec): 30 Tachycardia Detection : On Heartbeat  Detection Sensitivity: 1 Perform Verify Heartbeat Detection: yes Threshold for AutoStim (%): 20 Diagnostics Current Delievered (mA): 1.75 Impedence Value (Ohms): 2317 Battery Status Indicator (color): Green(75-100%)   IMPRESSION: This is a very pleasant 74 yo RH man with a history of focal epilepsy of right mesial temporal or right mesial frontal onset. Repeat MRI brain showed right hippocampal asymmetry suggestive of mesial temporal sclerosis. PET scan showed right mesial temporal hypometabolism. EEGs in the past showed right temporal spikes and TIRDA, EMU admission captured seizures that appeared to localize to the right temporal area although clinically suggestive of frontal onset. He had a sudden increase in seizures last month, he was also having a lot of back pain, and was found to have metastases to the spine and liver. Pain is better controlled, he has now also started chemotherapy. No seizures since 07/31/19. We agreed to continue on clobazam 10mg  qhs and Zonisamide 300mg  qhs. VNS interrogated today, no changes made, not at end of life. He is not driving. He will follow-up in 4 months and knows to call for any changes.    Thank you for allowing me to participate in his care.  Please do not hesitate to call for any questions or concerns.   Ellouise Newer, M.D.   CC: Dr. Dagmar Hait

## 2019-09-05 NOTE — Progress Notes (Signed)
Pharmacist Chemotherapy Monitoring - Follow Up Assessment    I verify that I have reviewed each item in the below checklist:  . Regimen for the patient is scheduled for the appropriate day and plan matches scheduled date. [name] Appropriate non-routine labs are ordered dependent on drug ordered. . If applicable, additional medications reviewed and ordered per protocol based on lifetime cumulative doses and/or treatment regimen.   Plan for follow-up and/or issues identified: No . I-vent associated with next due treatment: No . MD and/or nursing notified: No  Philomena Course 09/05/2019 10:57 AM

## 2019-09-06 ENCOUNTER — Telehealth: Payer: Self-pay | Admitting: Oncology

## 2019-09-06 DIAGNOSIS — M1990 Unspecified osteoarthritis, unspecified site: Secondary | ICD-10-CM | POA: Diagnosis not present

## 2019-09-06 DIAGNOSIS — G40219 Localization-related (focal) (partial) symptomatic epilepsy and epileptic syndromes with complex partial seizures, intractable, without status epilepticus: Secondary | ICD-10-CM | POA: Diagnosis not present

## 2019-09-06 DIAGNOSIS — E441 Mild protein-calorie malnutrition: Secondary | ICD-10-CM | POA: Diagnosis not present

## 2019-09-06 DIAGNOSIS — E039 Hypothyroidism, unspecified: Secondary | ICD-10-CM | POA: Diagnosis not present

## 2019-09-06 DIAGNOSIS — F419 Anxiety disorder, unspecified: Secondary | ICD-10-CM | POA: Diagnosis not present

## 2019-09-06 DIAGNOSIS — I872 Venous insufficiency (chronic) (peripheral): Secondary | ICD-10-CM | POA: Diagnosis not present

## 2019-09-06 DIAGNOSIS — Z0000 Encounter for general adult medical examination without abnormal findings: Secondary | ICD-10-CM | POA: Diagnosis not present

## 2019-09-06 DIAGNOSIS — Z4802 Encounter for removal of sutures: Secondary | ICD-10-CM | POA: Diagnosis not present

## 2019-09-06 DIAGNOSIS — C674 Malignant neoplasm of posterior wall of bladder: Secondary | ICD-10-CM | POA: Diagnosis not present

## 2019-09-06 NOTE — Telephone Encounter (Signed)
Scheduled per 05/07 los, spoke with patient's relative and patient will be notified of upcoming appointments.

## 2019-09-09 ENCOUNTER — Other Ambulatory Visit: Payer: Self-pay

## 2019-09-09 ENCOUNTER — Inpatient Hospital Stay: Payer: Medicare PPO

## 2019-09-09 VITALS — BP 115/91 | HR 76 | Temp 98.3°F | Resp 18 | Wt 120.5 lb

## 2019-09-09 DIAGNOSIS — C679 Malignant neoplasm of bladder, unspecified: Secondary | ICD-10-CM | POA: Diagnosis not present

## 2019-09-09 DIAGNOSIS — G893 Neoplasm related pain (acute) (chronic): Secondary | ICD-10-CM | POA: Diagnosis not present

## 2019-09-09 DIAGNOSIS — C674 Malignant neoplasm of posterior wall of bladder: Secondary | ICD-10-CM

## 2019-09-09 DIAGNOSIS — Z95828 Presence of other vascular implants and grafts: Secondary | ICD-10-CM

## 2019-09-09 DIAGNOSIS — D6481 Anemia due to antineoplastic chemotherapy: Secondary | ICD-10-CM | POA: Diagnosis not present

## 2019-09-09 DIAGNOSIS — C787 Secondary malignant neoplasm of liver and intrahepatic bile duct: Secondary | ICD-10-CM | POA: Diagnosis not present

## 2019-09-09 DIAGNOSIS — Z5111 Encounter for antineoplastic chemotherapy: Secondary | ICD-10-CM | POA: Diagnosis not present

## 2019-09-09 LAB — CMP (CANCER CENTER ONLY)
ALT: 30 U/L (ref 0–44)
AST: 37 U/L (ref 15–41)
Albumin: 3.1 g/dL — ABNORMAL LOW (ref 3.5–5.0)
Alkaline Phosphatase: 216 U/L — ABNORMAL HIGH (ref 38–126)
Anion gap: 8 (ref 5–15)
BUN: 18 mg/dL (ref 8–23)
CO2: 24 mmol/L (ref 22–32)
Calcium: 8.9 mg/dL (ref 8.9–10.3)
Chloride: 106 mmol/L (ref 98–111)
Creatinine: 0.84 mg/dL (ref 0.61–1.24)
GFR, Est AFR Am: >60 mL/min (ref >60)
GFR, Estimated: >60 mL/min (ref >60)
Glucose, Bld: 109 mg/dL — ABNORMAL HIGH (ref 70–99)
Potassium: 4.3 mmol/L (ref 3.5–5.1)
Sodium: 138 mmol/L (ref 135–145)
Total Bilirubin: <0.2 mg/dL — ABNORMAL LOW (ref 0.3–1.2)
Total Protein: 6.2 g/dL — ABNORMAL LOW (ref 6.5–8.1)

## 2019-09-09 LAB — CBC WITH DIFFERENTIAL (CANCER CENTER ONLY)
Abs Immature Granulocytes: 0.11 10*3/uL — ABNORMAL HIGH (ref 0.00–0.07)
Basophils Absolute: 0.1 10*3/uL (ref 0.0–0.1)
Basophils Relative: 2 %
Eosinophils Absolute: 0 10*3/uL (ref 0.0–0.5)
Eosinophils Relative: 1 %
HCT: 28.9 % — ABNORMAL LOW (ref 39.0–52.0)
Hemoglobin: 9 g/dL — ABNORMAL LOW (ref 13.0–17.0)
Immature Granulocytes: 5 %
Lymphocytes Relative: 40 %
Lymphs Abs: 0.9 10*3/uL (ref 0.7–4.0)
MCH: 26.4 pg (ref 26.0–34.0)
MCHC: 31.1 g/dL (ref 30.0–36.0)
MCV: 84.8 fL (ref 80.0–100.0)
Monocytes Absolute: 0.3 10*3/uL (ref 0.1–1.0)
Monocytes Relative: 15 %
Neutro Abs: 0.8 10*3/uL — ABNORMAL LOW (ref 1.7–7.7)
Neutrophils Relative %: 37 %
Platelet Count: 458 10*3/uL — ABNORMAL HIGH (ref 150–400)
RBC: 3.41 MIL/uL — ABNORMAL LOW (ref 4.22–5.81)
RDW: 18.6 % — ABNORMAL HIGH (ref 11.5–15.5)
WBC Count: 2.2 10*3/uL — ABNORMAL LOW (ref 4.0–10.5)
nRBC: 0 % (ref 0.0–0.2)

## 2019-09-09 MED ORDER — SODIUM CHLORIDE 0.9 % IV SOLN
800.0000 mg/m2 | Freq: Once | INTRAVENOUS | Status: AC
  Administered 2019-09-09: 1292 mg via INTRAVENOUS
  Filled 2019-09-09: qty 33.98

## 2019-09-09 MED ORDER — SODIUM CHLORIDE 0.9 % IV SOLN: 1000.0000 mg/m2 | Freq: Once | INTRAVENOUS | Status: DC

## 2019-09-09 MED ORDER — SODIUM CHLORIDE 0.9 % IV SOLN
Freq: Once | INTRAVENOUS | Status: AC
  Filled 2019-09-09: qty 250

## 2019-09-09 MED ORDER — SODIUM CHLORIDE 0.9% FLUSH
10.0000 mL | INTRAVENOUS | Status: DC | PRN
  Administered 2019-09-09: 10 mL
  Filled 2019-09-09: qty 10

## 2019-09-09 MED ORDER — SODIUM CHLORIDE 0.9% FLUSH
10.0000 mL | Freq: Once | INTRAVENOUS | Status: AC
  Administered 2019-09-09: 10 mL
  Filled 2019-09-09: qty 10

## 2019-09-09 MED ORDER — PROCHLORPERAZINE MALEATE 10 MG PO TABS
ORAL_TABLET | ORAL | Status: AC
  Filled 2019-09-09: qty 1

## 2019-09-09 MED ORDER — PROCHLORPERAZINE MALEATE 10 MG PO TABS
10.0000 mg | ORAL_TABLET | Freq: Once | ORAL | Status: AC
  Administered 2019-09-09: 10 mg via ORAL

## 2019-09-09 MED ORDER — HEPARIN SOD (PORK) LOCK FLUSH 100 UNIT/ML IV SOLN
500.0000 [IU] | Freq: Once | INTRAVENOUS | Status: AC | PRN
  Administered 2019-09-09: 500 [IU]
  Filled 2019-09-09: qty 5

## 2019-09-09 NOTE — Progress Notes (Signed)
Per Dr. Alen Blew, decr Gemzar dose to 800 mg/m2 starting today and going forward ANC = 0.8 today.  Kennith Center, Pharm.D., CPP 09/09/2019@10 :34 AM

## 2019-09-09 NOTE — Patient Instructions (Signed)
[facility] Discharge Instructions for Patients Receiving Chemotherapy  Today you received the following chemotherapy agents Gemzar  To help prevent nausea and vomiting after your treatment, we encourage you to take your nausea medication as directed.    If you develop nausea and vomiting that is not controlled by your nausea medication, call the clinic.   BELOW ARE SYMPTOMS THAT SHOULD BE REPORTED IMMEDIATELY:  *FEVER GREATER THAN 100.5 F  *CHILLS WITH OR WITHOUT FEVER  NAUSEA AND VOMITING THAT IS NOT CONTROLLED WITH YOUR NAUSEA MEDICATION  *UNUSUAL SHORTNESS OF BREATH  *UNUSUAL BRUISING OR BLEEDING  TENDERNESS IN MOUTH AND THROAT WITH OR WITHOUT PRESENCE OF ULCERS  *URINARY PROBLEMS  *BOWEL PROBLEMS  UNUSUAL RASH Items with * indicate a potential emergency and should be followed up as soon as possible.  Feel free to call the clinic you have any questions or concerns. The clinic phone number is [phone].  Please show the CHEMO ALERT CARD at check-in to the Emergency Department and triage nurse.   

## 2019-09-09 NOTE — Progress Notes (Signed)
Ok to treat with labs and ANC result from today per MD [facility]

## 2019-09-23 ENCOUNTER — Inpatient Hospital Stay: Payer: Medicare PPO

## 2019-09-23 ENCOUNTER — Telehealth: Payer: Self-pay

## 2019-09-23 ENCOUNTER — Inpatient Hospital Stay: Payer: Medicare PPO | Admitting: Oncology

## 2019-09-23 ENCOUNTER — Other Ambulatory Visit: Payer: Self-pay

## 2019-09-23 VITALS — BP 133/74 | HR 67 | Temp 97.5°F | Resp 18 | Ht 68.0 in | Wt 126.8 lb

## 2019-09-23 DIAGNOSIS — C674 Malignant neoplasm of posterior wall of bladder: Secondary | ICD-10-CM

## 2019-09-23 DIAGNOSIS — Z5111 Encounter for antineoplastic chemotherapy: Secondary | ICD-10-CM | POA: Diagnosis not present

## 2019-09-23 DIAGNOSIS — C679 Malignant neoplasm of bladder, unspecified: Secondary | ICD-10-CM | POA: Diagnosis not present

## 2019-09-23 DIAGNOSIS — C787 Secondary malignant neoplasm of liver and intrahepatic bile duct: Secondary | ICD-10-CM | POA: Diagnosis not present

## 2019-09-23 DIAGNOSIS — Z95828 Presence of other vascular implants and grafts: Secondary | ICD-10-CM

## 2019-09-23 DIAGNOSIS — D6481 Anemia due to antineoplastic chemotherapy: Secondary | ICD-10-CM | POA: Diagnosis not present

## 2019-09-23 DIAGNOSIS — G893 Neoplasm related pain (acute) (chronic): Secondary | ICD-10-CM | POA: Diagnosis not present

## 2019-09-23 LAB — CMP (CANCER CENTER ONLY)
ALT: 19 U/L (ref 0–44)
AST: 24 U/L (ref 15–41)
Albumin: 3.1 g/dL — ABNORMAL LOW (ref 3.5–5.0)
Alkaline Phosphatase: 161 U/L — ABNORMAL HIGH (ref 38–126)
Anion gap: 9 (ref 5–15)
BUN: 17 mg/dL (ref 8–23)
CO2: 21 mmol/L — ABNORMAL LOW (ref 22–32)
Calcium: 8.7 mg/dL — ABNORMAL LOW (ref 8.9–10.3)
Chloride: 110 mmol/L (ref 98–111)
Creatinine: 0.78 mg/dL (ref 0.61–1.24)
GFR, Est AFR Am: >60 mL/min (ref >60)
GFR, Estimated: >60 mL/min (ref >60)
Glucose, Bld: 82 mg/dL (ref 70–99)
Potassium: 4.2 mmol/L (ref 3.5–5.1)
Sodium: 140 mmol/L (ref 135–145)
Total Bilirubin: 0.3 mg/dL (ref 0.3–1.2)
Total Protein: 5.8 g/dL — ABNORMAL LOW (ref 6.5–8.1)

## 2019-09-23 LAB — CBC WITH DIFFERENTIAL (CANCER CENTER ONLY)
Abs Immature Granulocytes: 0.02 10*3/uL (ref 0.00–0.07)
Basophils Absolute: 0 10*3/uL (ref 0.0–0.1)
Basophils Relative: 0 %
Eosinophils Absolute: 0.1 10*3/uL (ref 0.0–0.5)
Eosinophils Relative: 2 %
HCT: 25.7 % — ABNORMAL LOW (ref 39.0–52.0)
Hemoglobin: 7.9 g/dL — ABNORMAL LOW (ref 13.0–17.0)
Immature Granulocytes: 1 %
Lymphocytes Relative: 12 %
Lymphs Abs: 0.5 10*3/uL — ABNORMAL LOW (ref 0.7–4.0)
MCH: 27 pg (ref 26.0–34.0)
MCHC: 30.7 g/dL (ref 30.0–36.0)
MCV: 87.7 fL (ref 80.0–100.0)
Monocytes Absolute: 0.7 10*3/uL (ref 0.1–1.0)
Monocytes Relative: 16 %
Neutro Abs: 3 10*3/uL (ref 1.7–7.7)
Neutrophils Relative %: 69 %
Platelet Count: 197 10*3/uL (ref 150–400)
RBC: 2.93 MIL/uL — ABNORMAL LOW (ref 4.22–5.81)
RDW: 21.5 % — ABNORMAL HIGH (ref 11.5–15.5)
WBC Count: 4.3 10*3/uL (ref 4.0–10.5)
nRBC: 0 % (ref 0.0–0.2)

## 2019-09-23 MED ORDER — SODIUM CHLORIDE 0.9 % IV SOLN
800.0000 mg/m2 | Freq: Once | INTRAVENOUS | Status: AC
  Administered 2019-09-23: 1292 mg via INTRAVENOUS
  Filled 2019-09-23: qty 33.98

## 2019-09-23 MED ORDER — SODIUM CHLORIDE 0.9 % IV SOLN
370.0000 mg | Freq: Once | INTRAVENOUS | Status: AC
  Administered 2019-09-23: 370 mg via INTRAVENOUS
  Filled 2019-09-23: qty 37

## 2019-09-23 MED ORDER — SODIUM CHLORIDE 0.9 % IV SOLN
10.0000 mg | Freq: Once | INTRAVENOUS | Status: AC
  Administered 2019-09-23: 10 mg via INTRAVENOUS
  Filled 2019-09-23: qty 10

## 2019-09-23 MED ORDER — HEPARIN SOD (PORK) LOCK FLUSH 100 UNIT/ML IV SOLN
500.0000 [IU] | Freq: Once | INTRAVENOUS | Status: AC | PRN
  Administered 2019-09-23: 500 [IU]
  Filled 2019-09-23: qty 5

## 2019-09-23 MED ORDER — SODIUM CHLORIDE 0.9% FLUSH
10.0000 mL | INTRAVENOUS | Status: DC | PRN
  Administered 2019-09-23: 10 mL
  Filled 2019-09-23: qty 10

## 2019-09-23 MED ORDER — PALONOSETRON HCL INJECTION 0.25 MG/5ML
0.2500 mg | Freq: Once | INTRAVENOUS | Status: AC
  Administered 2019-09-23: 0.25 mg via INTRAVENOUS

## 2019-09-23 MED ORDER — PALONOSETRON HCL INJECTION 0.25 MG/5ML
INTRAVENOUS | Status: AC
  Filled 2019-09-23: qty 5

## 2019-09-23 MED ORDER — SODIUM CHLORIDE 0.9 % IV SOLN
Freq: Once | INTRAVENOUS | Status: AC
  Filled 2019-09-23: qty 250

## 2019-09-23 MED ORDER — SODIUM CHLORIDE 0.9 % IV SOLN
150.0000 mg | Freq: Once | INTRAVENOUS | Status: AC
  Administered 2019-09-23: 150 mg via INTRAVENOUS
  Filled 2019-09-23: qty 150

## 2019-09-23 MED ORDER — SODIUM CHLORIDE 0.9% FLUSH
10.0000 mL | Freq: Once | INTRAVENOUS | Status: AC
  Administered 2019-09-23: 10 mL
  Filled 2019-09-23: qty 10

## 2019-09-23 NOTE — Telephone Encounter (Signed)
Per Dr. Elayne Snare to treat with HGB of 7.9

## 2019-09-23 NOTE — Progress Notes (Signed)
Per office note, ok to treat with anemia.  Will continue to monitor.  Pt is asymptomatic.

## 2019-09-23 NOTE — Progress Notes (Signed)
Hematology and Oncology Follow Up   John Holt [id number] 08-May-1945 74 y.o. 09/23/2019 9:24 AM Avva, Steva Ready, MDAvva, Ravisankar, MD      Principle Diagnosis: 74 year old man with bladder cancer diagnosed in 2018.  He developed stage IV disease with hepatic metastasis in April 2021 with hepatic disease.    Prior Therapy:  He is S/P  neoadjuvant chemotherapy in preparation for radical cystectomy utilizing MVAC for 4 cycles under the care of Dr. Marcello Moores at [facility].  He subsequently declined cystectomy for personal reasons.    He remained disease free till January 2020 where he has recurrence of non-muscle invasive disease.  He was started on Pembrolizumab in March 2020 after declining cystectomy.    Repeat TURBT in April 2020 showed persistent high-grade noninvasive papillary urothelial carcinoma.  He underwent BCG treatment in May 2020 under the care of Dr. Alyson Ingles.  Repeat TURBT in August 2020 showed muscle invasive disease without any evidence of metastatic disease based on imaging studies in June 2020.  He is status post radical cystectomy completed on February 25, 2019 with T3a N0 disease.   Current therapy: Gemcitabine and carboplatin started on August 12, 2019.  He is here for day 1 of cycle 3 of therapy.  Interim History: John Holt returns today for a follow-up visit.  Since the last visit, he completed 2 cycles of chemotherapy without any major complications.  He denies any nausea, vomiting or abdominal pain.  He is eating better and has gained weight.  He denies any dyspnea on exertion or shortness of breath.  He denies any worsening bone pain or pathological fractures.     Medications: Updated on review. Current Outpatient Medications  Medication Sig Dispense Refill  . cloBAZam (ONFI) 10 MG tablet Take 1 tablet every night 90 tablet 3  . clonazePAM (KLONOPIN) 0.5 MG tablet Take 1 tablet as needed for seizure cluster. Do not take more than  2-3 a week 10 tablet 5  . dexamethasone (DECADRON) 4 MG tablet Take 1 tablet (4 mg total) by mouth 2 (two) times daily with a meal. 30 tablet 0  . levothyroxine (SYNTHROID) 125 MCG tablet Take 125 mcg by mouth daily before breakfast.    . loperamide (IMODIUM A-D) 2 MG tablet Take 2 mg by mouth 4 (four) times daily as needed for diarrhea or loose stools.    . Multiple Vitamin (MULTIVITAMIN WITH MINERALS) TABS tablet Take 1 tablet by mouth in the morning and at bedtime.     . ondansetron (ZOFRAN ODT) 4 MG disintegrating tablet 4mg  ODT q4 hours prn nausea/vomit 10 tablet 0  . polyethylene glycol (MIRALAX / GLYCOLAX) 17 g packet Take 17 g by mouth daily. (Patient taking differently: Take 17 g by mouth daily as needed for moderate constipation. ) 14 each 0  . prochlorperazine (COMPAZINE) 10 MG tablet Take 1 tablet (10 mg total) by mouth every 6 (six) hours as needed for nausea or vomiting. 30 tablet 0  . senna (SENOKOT) 8.6 MG TABS tablet Take 2 tablets (17.2 mg total) by mouth 2 (two) times daily. 120 tablet 0  . zonisamide (ZONEGRAN) 100 MG capsule TAKE 3 CAPSULES EACH NIGHT 270 capsule 3   No current facility-administered medications for this visit.     Allergies:  Allergies  Allergen Reactions  . Chlorhexidine Rash    Full body  . Povidone-Iodine Rash     Physical exam:  Blood pressure 133/74, pulse 67, temperature (!) 97.5 F (36.4 C), temperature source  Temporal, resp. rate 18, height 5\' 8"  (1.727 m), weight 126 lb 12.8 oz (57.5 kg), SpO2 100 %.    ECOG 1    General appearance: Comfortable appearing without any discomfort Head: Normocephalic without any trauma Oropharynx: Mucous membranes are moist and pink without any thrush or ulcers. Eyes: Pupils are equal and round reactive to light. Lymph nodes: No cervical, supraclavicular, inguinal or axillary lymphadenopathy.   Heart:regular rate and rhythm.  S1 and S2 without leg edema. Lung: Clear without any rhonchi or wheezes.  No  dullness to percussion. Abdomin: Soft, nontender, nondistended with good bowel sounds.  No hepatosplenomegaly. Musculoskeletal: No joint deformity or effusion.  Full range of motion noted. Neurological: No deficits noted on motor, sensory and deep tendon reflex exam. Skin: No petechial rash or dryness.  Appeared moist.      Lab Results: Lab Results  Component Value Date   WBC 2.2 (L) 09/09/2019   HGB 9.0 (L) 09/09/2019   HCT 28.9 (L) 09/09/2019   MCV 84.8 09/09/2019   PLT 458 (H) 09/09/2019     Chemistry      Component Value Date/Time   NA 138 09/09/2019 0910   K 4.3 09/09/2019 0910   CL 106 09/09/2019 0910   CO2 24 09/09/2019 0910   BUN 18 09/09/2019 0910   CREATININE 0.84 09/09/2019 0910      Component Value Date/Time   CALCIUM 8.9 09/09/2019 0910   ALKPHOS 216 (H) 09/09/2019 0910   AST 37 09/09/2019 0910   ALT 30 09/09/2019 0910   BILITOT <0.2 (L) 09/09/2019 0910       Impression and Plan:  74 year old man with:  1.  Bladder cancer diagnosed in 2018 subsequently developed hepatic metastasis indicating stage IV in April 2021.    He is currently receiving palliative chemotherapy with carboplatin and gemcitabine without any major complications.  He has tolerated therapy with improvement in his clinical status.  Risks and benefits of continuing this approach were reviewed.  Complications include nausea, vomiting, myelosuppression, neuropathy as well as infusion related complications were discussed.  I have recommended repeat imaging studies after the start cycle of therapy to assess treatment response.  He is agreeable to proceed at this time.  2.  IV access: Port-A-Cath remains in use without any issues.  3.  Antiemetics: Compazine remains available to him without any recent nausea or vomiting.  4.  Prognosis and goals of care: His disease is incurable although aggressive measures are warranted given his reasonable performance status.  5.  Pain: Very limited at  this time and uses ibuprofen infrequently.  6.  Anemia: Related to chemotherapy.  We will continue to monitor and transfuse as needed.  7.  Follow-up: He will return in 1 week for day 8 of the current cycle of therapy.  In 3 weeks for the next cycle of therapy.    30  minutes were spent on this visit.  The time was dedicated to updating his disease status, discussing treatment complications, addressing future plan of care as well answering questions regarding future prognosis.  Zola Button, MD 09/23/2019 9:24 AM

## 2019-09-23 NOTE — Patient Instructions (Signed)
[facility] Discharge Instructions for Patients Receiving Chemotherapy  Today you received the following chemotherapy agents gemzar/carboplatin  To help prevent nausea and vomiting after your treatment, we encourage you to take your nausea medication as directed   If you develop nausea and vomiting that is not controlled by your nausea medication, call the clinic.   BELOW ARE SYMPTOMS THAT SHOULD BE REPORTED IMMEDIATELY:  *FEVER GREATER THAN 100.5 F  *CHILLS WITH OR WITHOUT FEVER  NAUSEA AND VOMITING THAT IS NOT CONTROLLED WITH YOUR NAUSEA MEDICATION  *UNUSUAL SHORTNESS OF BREATH  *UNUSUAL BRUISING OR BLEEDING  TENDERNESS IN MOUTH AND THROAT WITH OR WITHOUT PRESENCE OF ULCERS  *URINARY PROBLEMS  *BOWEL PROBLEMS  UNUSUAL RASH Items with * indicate a potential emergency and should be followed up as soon as possible.  Feel free to call the clinic you have any questions or concerns. The clinic phone number is [phone].  

## 2019-09-23 NOTE — Patient Instructions (Signed)

## 2019-09-27 ENCOUNTER — Telehealth: Payer: Self-pay | Admitting: Oncology

## 2019-09-27 NOTE — Telephone Encounter (Signed)
Scheduled appt per 5/28 los. °

## 2019-09-30 ENCOUNTER — Inpatient Hospital Stay: Payer: Medicare PPO

## 2019-09-30 ENCOUNTER — Inpatient Hospital Stay: Payer: Medicare PPO | Attending: Oncology

## 2019-09-30 ENCOUNTER — Other Ambulatory Visit: Payer: Self-pay

## 2019-09-30 VITALS — BP 104/72 | HR 78 | Temp 97.7°F | Resp 16

## 2019-09-30 DIAGNOSIS — C7951 Secondary malignant neoplasm of bone: Secondary | ICD-10-CM | POA: Diagnosis not present

## 2019-09-30 DIAGNOSIS — Z95828 Presence of other vascular implants and grafts: Secondary | ICD-10-CM

## 2019-09-30 DIAGNOSIS — C787 Secondary malignant neoplasm of liver and intrahepatic bile duct: Secondary | ICD-10-CM | POA: Diagnosis not present

## 2019-09-30 DIAGNOSIS — Z5111 Encounter for antineoplastic chemotherapy: Secondary | ICD-10-CM | POA: Diagnosis not present

## 2019-09-30 DIAGNOSIS — D6481 Anemia due to antineoplastic chemotherapy: Secondary | ICD-10-CM | POA: Diagnosis not present

## 2019-09-30 DIAGNOSIS — C679 Malignant neoplasm of bladder, unspecified: Secondary | ICD-10-CM | POA: Diagnosis not present

## 2019-09-30 DIAGNOSIS — C674 Malignant neoplasm of posterior wall of bladder: Secondary | ICD-10-CM

## 2019-09-30 LAB — CBC WITH DIFFERENTIAL (CANCER CENTER ONLY)
Abs Immature Granulocytes: 0.03 10*3/uL (ref 0.00–0.07)
Basophils Absolute: 0 10*3/uL (ref 0.0–0.1)
Basophils Relative: 1 %
Eosinophils Absolute: 0.1 10*3/uL (ref 0.0–0.5)
Eosinophils Relative: 3 %
HCT: 26.5 % — ABNORMAL LOW (ref 39.0–52.0)
Hemoglobin: 8.3 g/dL — ABNORMAL LOW (ref 13.0–17.0)
Immature Granulocytes: 1 %
Lymphocytes Relative: 26 %
Lymphs Abs: 0.6 10*3/uL — ABNORMAL LOW (ref 0.7–4.0)
MCH: 27.9 pg (ref 26.0–34.0)
MCHC: 31.3 g/dL (ref 30.0–36.0)
MCV: 88.9 fL (ref 80.0–100.0)
Monocytes Absolute: 0.3 10*3/uL (ref 0.1–1.0)
Monocytes Relative: 13 %
Neutro Abs: 1.2 10*3/uL — ABNORMAL LOW (ref 1.7–7.7)
Neutrophils Relative %: 56 %
Platelet Count: 332 10*3/uL (ref 150–400)
RBC: 2.98 MIL/uL — ABNORMAL LOW (ref 4.22–5.81)
RDW: 21.2 % — ABNORMAL HIGH (ref 11.5–15.5)
WBC Count: 2.1 10*3/uL — ABNORMAL LOW (ref 4.0–10.5)
nRBC: 0 % (ref 0.0–0.2)

## 2019-09-30 LAB — CMP (CANCER CENTER ONLY)
ALT: 33 U/L (ref 0–44)
AST: 38 U/L (ref 15–41)
Albumin: 3.3 g/dL — ABNORMAL LOW (ref 3.5–5.0)
Alkaline Phosphatase: 153 U/L — ABNORMAL HIGH (ref 38–126)
Anion gap: 10 (ref 5–15)
BUN: 19 mg/dL (ref 8–23)
CO2: 22 mmol/L (ref 22–32)
Calcium: 9.2 mg/dL (ref 8.9–10.3)
Chloride: 107 mmol/L (ref 98–111)
Creatinine: 0.84 mg/dL (ref 0.61–1.24)
GFR, Est AFR Am: >60 mL/min (ref >60)
GFR, Estimated: >60 mL/min (ref >60)
Glucose, Bld: 92 mg/dL (ref 70–99)
Potassium: 4.3 mmol/L (ref 3.5–5.1)
Sodium: 139 mmol/L (ref 135–145)
Total Bilirubin: <0.2 mg/dL — ABNORMAL LOW (ref 0.3–1.2)
Total Protein: 6.2 g/dL — ABNORMAL LOW (ref 6.5–8.1)

## 2019-09-30 MED ORDER — PROCHLORPERAZINE MALEATE 10 MG PO TABS
ORAL_TABLET | ORAL | Status: AC
  Filled 2019-09-30: qty 1

## 2019-09-30 MED ORDER — SODIUM CHLORIDE 0.9 % IV SOLN
800.0000 mg/m2 | Freq: Once | INTRAVENOUS | Status: AC
  Administered 2019-09-30: 1292 mg via INTRAVENOUS
  Filled 2019-09-30: qty 33.98

## 2019-09-30 MED ORDER — SODIUM CHLORIDE 0.9% FLUSH
10.0000 mL | Freq: Once | INTRAVENOUS | Status: AC
  Administered 2019-09-30: 10 mL
  Filled 2019-09-30: qty 10

## 2019-09-30 MED ORDER — PROCHLORPERAZINE MALEATE 10 MG PO TABS
10.0000 mg | ORAL_TABLET | Freq: Once | ORAL | Status: AC
  Administered 2019-09-30: 10 mg via ORAL

## 2019-09-30 MED ORDER — SODIUM CHLORIDE 0.9% FLUSH
10.0000 mL | INTRAVENOUS | Status: DC | PRN
  Administered 2019-09-30: 10 mL
  Filled 2019-09-30: qty 10

## 2019-09-30 MED ORDER — HEPARIN SOD (PORK) LOCK FLUSH 100 UNIT/ML IV SOLN
500.0000 [IU] | Freq: Once | INTRAVENOUS | Status: AC | PRN
  Administered 2019-09-30: 500 [IU]
  Filled 2019-09-30: qty 5

## 2019-09-30 MED ORDER — SODIUM CHLORIDE 0.9 % IV SOLN
Freq: Once | INTRAVENOUS | Status: AC
  Filled 2019-09-30: qty 250

## 2019-09-30 NOTE — Patient Instructions (Signed)

## 2019-09-30 NOTE — Progress Notes (Signed)
Per Dr. Alen Blew, [place] to treat with [place] 1.2.

## 2019-09-30 NOTE — Patient Instructions (Signed)
[facility] Discharge Instructions for Patients Receiving Chemotherapy  Today you received the following chemotherapy agents gemzar.  To help prevent nausea and vomiting after your treatment, we encourage you to take your nausea medication as directed.   If you develop nausea and vomiting that is not controlled by your nausea medication, call the clinic.   BELOW ARE SYMPTOMS THAT SHOULD BE REPORTED IMMEDIATELY:  *FEVER GREATER THAN 100.5 F  *CHILLS WITH OR WITHOUT FEVER  NAUSEA AND VOMITING THAT IS NOT CONTROLLED WITH YOUR NAUSEA MEDICATION  *UNUSUAL SHORTNESS OF BREATH  *UNUSUAL BRUISING OR BLEEDING  TENDERNESS IN MOUTH AND THROAT WITH OR WITHOUT PRESENCE OF ULCERS  *URINARY PROBLEMS  *BOWEL PROBLEMS  UNUSUAL RASH Items with * indicate a potential emergency and should be followed up as soon as possible.  Feel free to call the clinic should you have any questions or concerns. The clinic phone number is [phone].  Please show the CHEMO ALERT CARD at check-in to the Emergency Department and triage nurse.   

## 2019-10-10 DIAGNOSIS — Z8551 Personal history of malignant neoplasm of bladder: Secondary | ICD-10-CM | POA: Diagnosis not present

## 2019-10-10 DIAGNOSIS — Z936 Other artificial openings of urinary tract status: Secondary | ICD-10-CM | POA: Diagnosis not present

## 2019-10-12 ENCOUNTER — Other Ambulatory Visit: Payer: Self-pay

## 2019-10-12 ENCOUNTER — Ambulatory Visit (HOSPITAL_COMMUNITY)
Admission: RE | Admit: 2019-10-12 | Discharge: 2019-10-12 | Disposition: A | Discharge status: Home / Self Care | Payer: Medicare PPO | Source: Ambulatory Visit | Attending: Oncology | Admitting: Oncology

## 2019-10-12 ENCOUNTER — Encounter (HOSPITAL_COMMUNITY): Payer: Self-pay

## 2019-10-12 DIAGNOSIS — N1330 Unspecified hydronephrosis: Secondary | ICD-10-CM | POA: Diagnosis not present

## 2019-10-12 DIAGNOSIS — C7951 Secondary malignant neoplasm of bone: Secondary | ICD-10-CM | POA: Diagnosis not present

## 2019-10-12 DIAGNOSIS — C787 Secondary malignant neoplasm of liver and intrahepatic bile duct: Secondary | ICD-10-CM | POA: Diagnosis not present

## 2019-10-12 DIAGNOSIS — C674 Malignant neoplasm of posterior wall of bladder: Secondary | ICD-10-CM

## 2019-10-12 DIAGNOSIS — R911 Solitary pulmonary nodule: Secondary | ICD-10-CM | POA: Diagnosis not present

## 2019-10-12 DIAGNOSIS — C679 Malignant neoplasm of bladder, unspecified: Secondary | ICD-10-CM | POA: Diagnosis not present

## 2019-10-12 MED ORDER — SODIUM CHLORIDE (PF) 0.9 % IJ SOLN
INTRAMUSCULAR | Status: AC
  Filled 2019-10-12: qty 50

## 2019-10-12 MED ORDER — IOHEXOL 300 MG/ML  SOLN
100.0000 mL | Freq: Once | INTRAMUSCULAR | Status: AC | PRN
  Administered 2019-10-12: 100 mL via INTRAVENOUS

## 2019-10-12 MED ORDER — HEPARIN SOD (PORK) LOCK FLUSH 100 UNIT/ML IV SOLN
INTRAVENOUS | Status: AC
  Filled 2019-10-12: qty 5

## 2019-10-12 MED ORDER — HEPARIN SOD (PORK) LOCK FLUSH 100 UNIT/ML IV SOLN
500.0000 [IU] | Freq: Once | INTRAVENOUS | Status: AC
  Administered 2019-10-12: 500 [IU] via INTRAVENOUS

## 2019-10-14 ENCOUNTER — Inpatient Hospital Stay: Payer: Medicare PPO

## 2019-10-14 ENCOUNTER — Inpatient Hospital Stay: Payer: Medicare PPO | Admitting: Oncology

## 2019-10-14 ENCOUNTER — Other Ambulatory Visit: Payer: Self-pay

## 2019-10-14 VITALS — BP 109/74 | HR 66 | Temp 98.1°F | Resp 18 | Ht 68.0 in | Wt 130.2 lb

## 2019-10-14 DIAGNOSIS — C674 Malignant neoplasm of posterior wall of bladder: Secondary | ICD-10-CM

## 2019-10-14 DIAGNOSIS — Z5111 Encounter for antineoplastic chemotherapy: Secondary | ICD-10-CM | POA: Diagnosis not present

## 2019-10-14 DIAGNOSIS — Z95828 Presence of other vascular implants and grafts: Secondary | ICD-10-CM

## 2019-10-14 DIAGNOSIS — C7951 Secondary malignant neoplasm of bone: Secondary | ICD-10-CM | POA: Diagnosis not present

## 2019-10-14 DIAGNOSIS — D6481 Anemia due to antineoplastic chemotherapy: Secondary | ICD-10-CM | POA: Diagnosis not present

## 2019-10-14 DIAGNOSIS — C787 Secondary malignant neoplasm of liver and intrahepatic bile duct: Secondary | ICD-10-CM | POA: Diagnosis not present

## 2019-10-14 DIAGNOSIS — C679 Malignant neoplasm of bladder, unspecified: Secondary | ICD-10-CM | POA: Diagnosis not present

## 2019-10-14 DIAGNOSIS — D649 Anemia, unspecified: Secondary | ICD-10-CM

## 2019-10-14 LAB — CMP (CANCER CENTER ONLY)
ALT: 15 U/L (ref 0–44)
AST: 22 U/L (ref 15–41)
Albumin: 3.3 g/dL — ABNORMAL LOW (ref 3.5–5.0)
Alkaline Phosphatase: 136 U/L — ABNORMAL HIGH (ref 38–126)
Anion gap: 5 (ref 5–15)
BUN: 26 mg/dL — ABNORMAL HIGH (ref 8–23)
CO2: 23 mmol/L (ref 22–32)
Calcium: 8.8 mg/dL — ABNORMAL LOW (ref 8.9–10.3)
Chloride: 111 mmol/L (ref 98–111)
Creatinine: 0.83 mg/dL (ref 0.61–1.24)
GFR, Est AFR Am: >60 mL/min (ref >60)
GFR, Estimated: >60 mL/min (ref >60)
Glucose, Bld: 82 mg/dL (ref 70–99)
Potassium: 4.4 mmol/L (ref 3.5–5.1)
Sodium: 139 mmol/L (ref 135–145)
Total Bilirubin: 0.2 mg/dL — ABNORMAL LOW (ref 0.3–1.2)
Total Protein: 5.8 g/dL — ABNORMAL LOW (ref 6.5–8.1)

## 2019-10-14 LAB — CBC WITH DIFFERENTIAL (CANCER CENTER ONLY)
Abs Immature Granulocytes: 0.02 10*3/uL (ref 0.00–0.07)
Basophils Absolute: 0 10*3/uL (ref 0.0–0.1)
Basophils Relative: 1 %
Eosinophils Absolute: 0.1 10*3/uL (ref 0.0–0.5)
Eosinophils Relative: 1 %
HCT: 23.7 % — ABNORMAL LOW (ref 39.0–52.0)
Hemoglobin: 7.4 g/dL — ABNORMAL LOW (ref 13.0–17.0)
Immature Granulocytes: 1 %
Lymphocytes Relative: 12 %
Lymphs Abs: 0.5 10*3/uL — ABNORMAL LOW (ref 0.7–4.0)
MCH: 27.5 pg (ref 26.0–34.0)
MCHC: 31.2 g/dL (ref 30.0–36.0)
MCV: 88.1 fL (ref 80.0–100.0)
Monocytes Absolute: 1 10*3/uL (ref 0.1–1.0)
Monocytes Relative: 25 %
Neutro Abs: 2.4 10*3/uL (ref 1.7–7.7)
Neutrophils Relative %: 60 %
Platelet Count: 202 10*3/uL (ref 150–400)
RBC: 2.69 MIL/uL — ABNORMAL LOW (ref 4.22–5.81)
RDW: 21.9 % — ABNORMAL HIGH (ref 11.5–15.5)
WBC Count: 3.9 10*3/uL — ABNORMAL LOW (ref 4.0–10.5)
nRBC: 0 % (ref 0.0–0.2)

## 2019-10-14 MED ORDER — SODIUM CHLORIDE 0.9 % IV SOLN
Freq: Once | INTRAVENOUS | Status: AC
  Filled 2019-10-14: qty 250

## 2019-10-14 MED ORDER — SODIUM CHLORIDE 0.9% FLUSH
10.0000 mL | INTRAVENOUS | Status: DC | PRN
  Administered 2019-10-14: 10 mL
  Filled 2019-10-14: qty 10

## 2019-10-14 MED ORDER — PALONOSETRON HCL INJECTION 0.25 MG/5ML
INTRAVENOUS | Status: AC
  Filled 2019-10-14: qty 5

## 2019-10-14 MED ORDER — PALONOSETRON HCL INJECTION 0.25 MG/5ML
0.2500 mg | Freq: Once | INTRAVENOUS | Status: AC
  Administered 2019-10-14: 0.25 mg via INTRAVENOUS

## 2019-10-14 MED ORDER — SODIUM CHLORIDE 0.9 % IV SOLN
10.0000 mg | Freq: Once | INTRAVENOUS | Status: AC
  Administered 2019-10-14: 10 mg via INTRAVENOUS
  Filled 2019-10-14: qty 10

## 2019-10-14 MED ORDER — SODIUM CHLORIDE 0.9 % IV SOLN
800.0000 mg/m2 | Freq: Once | INTRAVENOUS | Status: AC
  Administered 2019-10-14: 1292 mg via INTRAVENOUS
  Filled 2019-10-14: qty 33.98

## 2019-10-14 MED ORDER — SODIUM CHLORIDE 0.9% FLUSH
10.0000 mL | Freq: Once | INTRAVENOUS | Status: AC
  Administered 2019-10-14: 10 mL
  Filled 2019-10-14: qty 10

## 2019-10-14 MED ORDER — HEPARIN SOD (PORK) LOCK FLUSH 100 UNIT/ML IV SOLN
500.0000 [IU] | Freq: Once | INTRAVENOUS | Status: AC | PRN
  Administered 2019-10-14: 500 [IU]
  Filled 2019-10-14: qty 5

## 2019-10-14 MED ORDER — SODIUM CHLORIDE 0.9 % IV SOLN
367.0000 mg | Freq: Once | INTRAVENOUS | Status: AC
  Administered 2019-10-14: 370 mg via INTRAVENOUS
  Filled 2019-10-14: qty 37

## 2019-10-14 MED ORDER — SODIUM CHLORIDE 0.9 % IV SOLN
150.0000 mg | Freq: Once | INTRAVENOUS | Status: AC
  Administered 2019-10-14: 150 mg via INTRAVENOUS
  Filled 2019-10-14: qty 150

## 2019-10-14 NOTE — Progress Notes (Signed)
Hematology and Oncology Follow Up   John Holt [id number] 01-29-1946 74 y.o. 10/14/2019 11:50 AM Avva, John Em, MD      Principle Diagnosis: 74 year old man with stage IV bladder cancer with hepatic metastasis diagnosed in April 2021. He was initially diagnosed in 2018.   Prior Therapy:  He is S/P  neoadjuvant chemotherapy in preparation for radical cystectomy utilizing MVAC for 4 cycles under the care of Dr. Marcello Moores at [facility].  He subsequently declined cystectomy for personal reasons.    He remained disease free till January 2020 where he has recurrence of non-muscle invasive disease.  He was started on Pembrolizumab in March 2020 after declining cystectomy.    Repeat TURBT in April 2020 showed persistent high-grade noninvasive papillary urothelial carcinoma.  He underwent BCG treatment in May 2020 under the care of Dr. Alyson Ingles.  Repeat TURBT in August 2020 showed muscle invasive disease without any evidence of metastatic disease based on imaging studies in June 2020.  He is status post radical cystectomy completed on February 25, 2019 with T3a N0 disease.   Current therapy: Gemcitabine and carboplatin started on August 12, 2019. He received gemcitabine and carboplatin on day 1, gemcitabine day 8 out of a 21-day cycle. He is here for day 1 of cycle 4 of therapy.  Interim History: [name] presents today for a follow-up visit. Since the last visit, he reports no major changes in his health. He continues to tolerate chemotherapy without any major complaints. He denies any nausea, vomiting or abdominal pain. He denies any excessive fatigue or tiredness. He does report some occasional slowing and some fatigue with activity. He denies any chest pain, shortness of breath or palpitation. His bone pain has been significantly improved since the start of chemotherapy and uses ibuprofen only.     Medications: Reviewed without changes. Current  Outpatient Medications  Medication Sig Dispense Refill  . cloBAZam (ONFI) 10 MG tablet Take 1 tablet every night 90 tablet 3  . clonazePAM (KLONOPIN) 0.5 MG tablet Take 1 tablet as needed for seizure cluster. Do not take more than 2-3 a week 10 tablet 5  . dexamethasone (DECADRON) 4 MG tablet Take 1 tablet (4 mg total) by mouth 2 (two) times daily with a meal. 30 tablet 0  . levothyroxine (SYNTHROID) 125 MCG tablet Take 125 mcg by mouth daily before breakfast.    . loperamide (IMODIUM A-D) 2 MG tablet Take 2 mg by mouth 4 (four) times daily as needed for diarrhea or loose stools.    . Multiple Vitamin (MULTIVITAMIN WITH MINERALS) TABS tablet Take 1 tablet by mouth in the morning and at bedtime.     . ondansetron (ZOFRAN ODT) 4 MG disintegrating tablet 4mg  ODT q4 hours prn nausea/vomit 10 tablet 0  . polyethylene glycol (MIRALAX / GLYCOLAX) 17 g packet Take 17 g by mouth daily. (Patient taking differently: Take 17 g by mouth daily as needed for moderate constipation. ) 14 each 0  . prochlorperazine (COMPAZINE) 10 MG tablet Take 1 tablet (10 mg total) by mouth every 6 (six) hours as needed for nausea or vomiting. (Patient not taking: Reported on 09/23/2019) 30 tablet 0  . senna (SENOKOT) 8.6 MG TABS tablet Take 2 tablets (17.2 mg total) by mouth 2 (two) times daily. 120 tablet 0  . zonisamide (ZONEGRAN) 100 MG capsule TAKE 3 CAPSULES EACH NIGHT 270 capsule 3   No current facility-administered medications for this visit.     Allergies:  Allergies  Allergen Reactions  . Chlorhexidine Rash    Full body  . Povidone-Iodine Rash     Physical exam:  Blood pressure 109/74, pulse 66, temperature 98.1 F (36.7 C), temperature source Temporal, resp. rate 18, height 5\' 8"  (1.727 m), weight 130 lb 3.2 oz (59.1 kg), SpO2 100 %.    ECOG 1     General appearance: Alert, awake without any distress. Head: Atraumatic without abnormalities Oropharynx: Without any thrush or ulcers. Eyes: No scleral  icterus. Lymph nodes: No lymphadenopathy noted in the cervical, supraclavicular, or axillary nodes Heart:regular rate and rhythm, without any murmurs or gallops.   Lung: Clear to auscultation without any rhonchi, wheezes or dullness to percussion. Abdomin: Soft, nontender without any shifting dullness or ascites. Musculoskeletal: No clubbing or cyanosis. Neurological: No motor or sensory deficits. Skin: No rashes or lesions.      Lab Results: Lab Results  Component Value Date   WBC 3.9 (L) 10/14/2019   HGB 7.4 (L) 10/14/2019   HCT 23.7 (L) 10/14/2019   MCV 88.1 10/14/2019   PLT 202 10/14/2019     Chemistry      Component Value Date/Time   NA 139 09/30/2019 0920   K 4.3 09/30/2019 0920   CL 107 09/30/2019 0920   CO2 22 09/30/2019 0920   BUN 19 09/30/2019 0920   CREATININE 0.84 09/30/2019 0920      Component Value Date/Time   CALCIUM 9.2 09/30/2019 0920   ALKPHOS 153 (H) 09/30/2019 0920   AST 38 09/30/2019 0920   ALT 33 09/30/2019 0920   BILITOT <0.2 (L) 09/30/2019 0920     IMPRESSION: 1. Apparent interval mixed response to therapy. 2. Multifocal liver metastases are variably increased in size or decreased in size in the interval. 3. Numerous bone metastases in the thoracolumbar spine and bony pelvis. Some of these appear more sclerotic today suggesting interval healing, but others have increased in size and a new lesion is identified in the posterior left iliac bone. 4. Stable mild lymphadenopathy in the upper abdomen. 5. Stable minimal bilateral hydroureteronephrosis with ileal conduit. 6. Moderate to large hiatal hernia. 7. 2-3 mm left lower lobe pulmonary nodule, new in the interval. Attention on follow-up recommended.  Impression and Plan:  74 year old man with:  1. Stage IV bladder cancer diagnosed in April 2021 with hepatic and bone involvement.  He has tolerated that systemic chemotherapy without any major complications. He has a reported clinical  improvement in his symptoms predominantly decreased bone pain. He does report some mild fatigue tiredness but otherwise no complications related to chemotherapy. CT scan obtained on 10/12/2019 showed overall positive response to therapy with decrease in his hepatic metastasis as well as sclerotic bone lesions. There are liver lesions that have slightly increased in size but as a whole scan is overall showed disease improvement.  Risks and benefits of continuing chemotherapy were reviewed today he is agreeable to continue at this time. The plan is to complete 6 cycles of therapy.  2.  IV access: Port-A-Cath continues to be in use at this time without any complications.  3.  Antiemetics: No nausea or vomiting reported at this time. Compazine is available to him.  4.  Prognosis and goals of care: Therapy remains palliative at this time although aggressive measures are warranted given excellent performance status and clinical movement.  5.  Pain: Related to sclerotic bone lesions. His pain has improved since the start of chemotherapy.  6.  Anemia: Related to malignancy and chemotherapy. We will  arrange for packed red cell transfusion in preparation for worsening and future.  7.  Follow-up: He will return in 1 week for day 8 of the cycle and packed red cell transfusion in 1 week. He will return in 3 weeks for the start of cycle 5 and 6 weeks for the start of cycle 6.    30  minutes were dedicated to this encounter. The time was spent on reviewing his disease status, discussing imaging studies, discussing prognosis and managing complications related to therapy.  Zola Button, MD 10/14/2019 11:50 AM

## 2019-10-14 NOTE — Patient Instructions (Signed)
[facility] Discharge Instructions for Patients Receiving Chemotherapy  Today you received the following chemotherapy agents gemzar/carboplatin  To help prevent nausea and vomiting after your treatment, we encourage you to take your nausea medication as directed   If you develop nausea and vomiting that is not controlled by your nausea medication, call the clinic.   BELOW ARE SYMPTOMS THAT SHOULD BE REPORTED IMMEDIATELY:  *FEVER GREATER THAN 100.5 F  *CHILLS WITH OR WITHOUT FEVER  NAUSEA AND VOMITING THAT IS NOT CONTROLLED WITH YOUR NAUSEA MEDICATION  *UNUSUAL SHORTNESS OF BREATH  *UNUSUAL BRUISING OR BLEEDING  TENDERNESS IN MOUTH AND THROAT WITH OR WITHOUT PRESENCE OF ULCERS  *URINARY PROBLEMS  *BOWEL PROBLEMS  UNUSUAL RASH Items with * indicate a potential emergency and should be followed up as soon as possible.  Feel free to call the clinic you have any questions or concerns. The clinic phone number is [phone].  

## 2019-10-19 ENCOUNTER — Other Ambulatory Visit: Payer: Self-pay

## 2019-10-19 DIAGNOSIS — C674 Malignant neoplasm of posterior wall of bladder: Secondary | ICD-10-CM

## 2019-10-21 ENCOUNTER — Other Ambulatory Visit: Payer: Self-pay

## 2019-10-21 ENCOUNTER — Inpatient Hospital Stay: Payer: Medicare PPO

## 2019-10-21 VITALS — BP 120/78 | HR 57 | Temp 97.8°F | Resp 18

## 2019-10-21 DIAGNOSIS — D649 Anemia, unspecified: Secondary | ICD-10-CM

## 2019-10-21 DIAGNOSIS — C674 Malignant neoplasm of posterior wall of bladder: Secondary | ICD-10-CM

## 2019-10-21 DIAGNOSIS — D6481 Anemia due to antineoplastic chemotherapy: Secondary | ICD-10-CM | POA: Diagnosis not present

## 2019-10-21 DIAGNOSIS — C787 Secondary malignant neoplasm of liver and intrahepatic bile duct: Secondary | ICD-10-CM | POA: Diagnosis not present

## 2019-10-21 DIAGNOSIS — C7951 Secondary malignant neoplasm of bone: Secondary | ICD-10-CM | POA: Diagnosis not present

## 2019-10-21 DIAGNOSIS — C679 Malignant neoplasm of bladder, unspecified: Secondary | ICD-10-CM | POA: Diagnosis not present

## 2019-10-21 DIAGNOSIS — Z5111 Encounter for antineoplastic chemotherapy: Secondary | ICD-10-CM | POA: Diagnosis not present

## 2019-10-21 DIAGNOSIS — Z95828 Presence of other vascular implants and grafts: Secondary | ICD-10-CM

## 2019-10-21 LAB — CBC WITH DIFFERENTIAL (CANCER CENTER ONLY)
Abs Immature Granulocytes: 0.03 10*3/uL (ref 0.00–0.07)
Basophils Absolute: 0 10*3/uL (ref 0.0–0.1)
Basophils Relative: 1 %
Eosinophils Absolute: 0 10*3/uL (ref 0.0–0.5)
Eosinophils Relative: 1 %
HCT: 23.4 % — ABNORMAL LOW (ref 39.0–52.0)
Hemoglobin: 7.2 g/dL — ABNORMAL LOW (ref 13.0–17.0)
Immature Granulocytes: 1 %
Lymphocytes Relative: 28 %
Lymphs Abs: 0.7 10*3/uL (ref 0.7–4.0)
MCH: 27.8 pg (ref 26.0–34.0)
MCHC: 30.8 g/dL (ref 30.0–36.0)
MCV: 90.3 fL (ref 80.0–100.0)
Monocytes Absolute: 0.4 10*3/uL (ref 0.1–1.0)
Monocytes Relative: 16 %
Neutro Abs: 1.3 10*3/uL — ABNORMAL LOW (ref 1.7–7.7)
Neutrophils Relative %: 53 %
Platelet Count: 255 10*3/uL (ref 150–400)
RBC: 2.59 MIL/uL — ABNORMAL LOW (ref 4.22–5.81)
RDW: 21 % — ABNORMAL HIGH (ref 11.5–15.5)
WBC Count: 2.5 10*3/uL — ABNORMAL LOW (ref 4.0–10.5)
nRBC: 0 % (ref 0.0–0.2)

## 2019-10-21 LAB — CMP (CANCER CENTER ONLY)
ALT: 25 U/L (ref 0–44)
AST: 30 U/L (ref 15–41)
Albumin: 3.5 g/dL (ref 3.5–5.0)
Alkaline Phosphatase: 138 U/L — ABNORMAL HIGH (ref 38–126)
Anion gap: 9 (ref 5–15)
BUN: 20 mg/dL (ref 8–23)
CO2: 22 mmol/L (ref 22–32)
Calcium: 9 mg/dL (ref 8.9–10.3)
Chloride: 109 mmol/L (ref 98–111)
Creatinine: 0.84 mg/dL (ref 0.61–1.24)
GFR, Est AFR Am: >60 mL/min (ref >60)
GFR, Estimated: >60 mL/min (ref >60)
Glucose, Bld: 92 mg/dL (ref 70–99)
Potassium: 4.4 mmol/L (ref 3.5–5.1)
Sodium: 140 mmol/L (ref 135–145)
Total Bilirubin: <0.2 mg/dL — ABNORMAL LOW (ref 0.3–1.2)
Total Protein: 6.2 g/dL — ABNORMAL LOW (ref 6.5–8.1)

## 2019-10-21 LAB — SAMPLE TO BLOOD BANK

## 2019-10-21 LAB — PREPARE RBC (CROSSMATCH)

## 2019-10-21 LAB — ABO/RH: ABO/RH(D): A POS

## 2019-10-21 MED ORDER — PROCHLORPERAZINE MALEATE 10 MG PO TABS
10.0000 mg | ORAL_TABLET | Freq: Once | ORAL | Status: AC
  Administered 2019-10-21: 10 mg via ORAL

## 2019-10-21 MED ORDER — SODIUM CHLORIDE 0.9 % IV SOLN
800.0000 mg/m2 | Freq: Once | INTRAVENOUS | Status: AC
  Administered 2019-10-21: 1292 mg via INTRAVENOUS
  Filled 2019-10-21: qty 33.98

## 2019-10-21 MED ORDER — SODIUM CHLORIDE 0.9% FLUSH
10.0000 mL | Freq: Once | INTRAVENOUS | Status: AC
  Administered 2019-10-21: 10 mL
  Filled 2019-10-21: qty 10

## 2019-10-21 MED ORDER — HEPARIN SOD (PORK) LOCK FLUSH 100 UNIT/ML IV SOLN
500.0000 [IU] | Freq: Once | INTRAVENOUS | Status: DC | PRN
  Filled 2019-10-21: qty 5

## 2019-10-21 MED ORDER — ACETAMINOPHEN 325 MG PO TABS
ORAL_TABLET | ORAL | Status: AC
  Filled 2019-10-21: qty 2

## 2019-10-21 MED ORDER — DIPHENHYDRAMINE HCL 25 MG PO CAPS
ORAL_CAPSULE | ORAL | Status: AC
  Filled 2019-10-21: qty 1

## 2019-10-21 MED ORDER — ACETAMINOPHEN 325 MG PO TABS
650.0000 mg | ORAL_TABLET | Freq: Once | ORAL | Status: AC
  Administered 2019-10-21: 650 mg via ORAL

## 2019-10-21 MED ORDER — SODIUM CHLORIDE 0.9% IV SOLUTION
250.0000 mL | Freq: Once | INTRAVENOUS | Status: DC
  Filled 2019-10-21: qty 250

## 2019-10-21 MED ORDER — HEPARIN SOD (PORK) LOCK FLUSH 100 UNIT/ML IV SOLN
500.0000 [IU] | Freq: Once | INTRAVENOUS | Status: AC
  Administered 2019-10-21: 500 [IU]
  Filled 2019-10-21: qty 5

## 2019-10-21 MED ORDER — SODIUM CHLORIDE 0.9 % IV SOLN
Freq: Once | INTRAVENOUS | Status: AC
  Filled 2019-10-21: qty 250

## 2019-10-21 MED ORDER — SODIUM CHLORIDE 0.9% FLUSH
10.0000 mL | INTRAVENOUS | Status: DC | PRN
  Filled 2019-10-21: qty 10

## 2019-10-21 MED ORDER — PROCHLORPERAZINE MALEATE 10 MG PO TABS
ORAL_TABLET | ORAL | Status: AC
  Filled 2019-10-21: qty 1

## 2019-10-21 MED ORDER — DIPHENHYDRAMINE HCL 25 MG PO CAPS
25.0000 mg | ORAL_CAPSULE | Freq: Once | ORAL | Status: AC
  Administered 2019-10-21: 25 mg via ORAL

## 2019-10-21 NOTE — Progress Notes (Unsigned)
[place]ut[place] 1.3today. Okay to treat per Dr. Alen Blew.

## 2019-10-21 NOTE — Patient Instructions (Signed)
[facility] Discharge Instructions for Patients Receiving Chemotherapy  Today you received the following chemotherapy agents: gemzar  To help prevent nausea and vomiting after your treatment, we encourage you to take your nausea medication as directed.    If you develop nausea and vomiting that is not controlled by your nausea medication, call the clinic.   BELOW ARE SYMPTOMS THAT SHOULD BE REPORTED IMMEDIATELY:  *FEVER GREATER THAN 100.5 F  *CHILLS WITH OR WITHOUT FEVER  NAUSEA AND VOMITING THAT IS NOT CONTROLLED WITH YOUR NAUSEA MEDICATION  *UNUSUAL SHORTNESS OF BREATH  *UNUSUAL BRUISING OR BLEEDING  TENDERNESS IN MOUTH AND THROAT WITH OR WITHOUT PRESENCE OF ULCERS  *URINARY PROBLEMS  *BOWEL PROBLEMS  UNUSUAL RASH Items with * indicate a potential emergency and should be followed up as soon as possible.  Feel free to call the clinic should you have any questions or concerns. The clinic phone number is [phone].  Please show the [place] at check-in to the Emergency Department and triage nurse.   Blood Transfusion, Adult, Care After This sheet gives you information about how to care for yourself after your procedure. Your doctor may also give you more specific instructions. If you have problems or questions, contact your doctor. What can I expect after the procedure? After the procedure, it is common to have:  Bruising and soreness at the IV site.  A fever or chills on the day of the procedure. This may be your body's response to the new blood cells received.  A headache. Follow these instructions at home: Insertion site care      Follow instructions from your doctor about how to take care of your insertion site. This is where an IV tube was put into your vein. Make sure you: ? Wash your hands with soap and water before and after you change your bandage (dressing). If you cannot use soap and water, use hand sanitizer. ? Change your  bandage as told by your doctor.  Check your insertion site every day for signs of infection. Check for: ? Redness, swelling, or pain. ? Bleeding from the site. ? Warmth. ? Pus or a bad smell. General instructions  Take over-the-counter and prescription medicines only as told by your doctor.  Rest as told by your doctor.  Go back to your normal activities as told by your doctor.  Keep all follow-up visits as told by your doctor. This is important. Contact a doctor if:  You have itching or red, swollen areas of skin (hives).  You feel worried or nervous (anxious).  You feel weak after doing your normal activities.  You have redness, swelling, warmth, or pain around the insertion site.  You have blood coming from the insertion site, and the blood does not stop with pressure.  You have pus or a bad smell coming from the insertion site. Get help right away if:  You have signs of a serious reaction. This may be coming from an allergy or the body's defense system (immune system). Signs include: ? Trouble breathing or shortness of breath. ? Swelling of the face or feeling warm (flushed). ? Fever or chills. ? Head, chest, or back pain. ? Dark pee (urine) or blood in the pee. ? Widespread rash. ? Fast heartbeat. ? Feeling dizzy or light-headed. You may receive your blood transfusion in an outpatient setting. If so, you will be told whom to contact to report any reactions. These symptoms may be an emergency. Do not wait to  see if the symptoms will go away. Get medical help right away. Call your local emergency services (911 in the [place] Do not drive yourself to the hospital. Summary  Bruising and soreness at the IV site are common.  Check your insertion site every day for signs of infection.  Rest as told by your doctor. Go back to your normal activities as told by your doctor.  Get help right away if you have signs of a serious reaction. This information is not intended to  replace advice given to you by your health care provider. Make sure you discuss any questions you have with your health care provider. Document Revised: 10/07/2018 Document Reviewed: 10/07/2018 Elsevier Patient Education  [place].

## 2019-10-23 LAB — TYPE AND SCREEN
ABO/RH(D): A POS
Antibody Screen: NEGATIVE
Unit division: 0
Unit division: 0

## 2019-10-23 LAB — BPAM RBC
Blood Product Expiration Date: 202107062359
Blood Product Expiration Date: 202107062359
ISSUE DATE / TIME: 202106251430
ISSUE DATE / TIME: 202106251430
Unit Type and Rh: 6200
Unit Type and Rh: 6200

## 2019-10-26 ENCOUNTER — Other Ambulatory Visit: Payer: Self-pay | Admitting: Oncology

## 2019-11-04 ENCOUNTER — Inpatient Hospital Stay: Payer: Medicare PPO | Attending: Oncology

## 2019-11-04 ENCOUNTER — Other Ambulatory Visit: Payer: Self-pay

## 2019-11-04 ENCOUNTER — Inpatient Hospital Stay: Payer: Medicare PPO | Admitting: Nurse Practitioner

## 2019-11-04 ENCOUNTER — Inpatient Hospital Stay: Payer: Medicare PPO

## 2019-11-04 ENCOUNTER — Encounter: Payer: Self-pay | Admitting: Nurse Practitioner

## 2019-11-04 VITALS — BP 115/78 | HR 73 | Temp 97.9°F | Resp 16 | Ht 68.0 in | Wt 129.0 lb

## 2019-11-04 DIAGNOSIS — C674 Malignant neoplasm of posterior wall of bladder: Secondary | ICD-10-CM

## 2019-11-04 DIAGNOSIS — D6481 Anemia due to antineoplastic chemotherapy: Secondary | ICD-10-CM | POA: Insufficient documentation

## 2019-11-04 DIAGNOSIS — C679 Malignant neoplasm of bladder, unspecified: Secondary | ICD-10-CM | POA: Diagnosis not present

## 2019-11-04 DIAGNOSIS — Z452 Encounter for adjustment and management of vascular access device: Secondary | ICD-10-CM | POA: Diagnosis not present

## 2019-11-04 DIAGNOSIS — Z5111 Encounter for antineoplastic chemotherapy: Secondary | ICD-10-CM | POA: Diagnosis not present

## 2019-11-04 DIAGNOSIS — N390 Urinary tract infection, site not specified: Secondary | ICD-10-CM | POA: Insufficient documentation

## 2019-11-04 DIAGNOSIS — D649 Anemia, unspecified: Secondary | ICD-10-CM

## 2019-11-04 DIAGNOSIS — Z95828 Presence of other vascular implants and grafts: Secondary | ICD-10-CM

## 2019-11-04 LAB — CBC WITH DIFFERENTIAL (CANCER CENTER ONLY)
Abs Immature Granulocytes: 0.01 10*3/uL (ref 0.00–0.07)
Basophils Absolute: 0 10*3/uL (ref 0.0–0.1)
Basophils Relative: 1 %
Eosinophils Absolute: 0.1 10*3/uL (ref 0.0–0.5)
Eosinophils Relative: 3 %
HCT: 30.9 % — ABNORMAL LOW (ref 39.0–52.0)
Hemoglobin: 9.8 g/dL — ABNORMAL LOW (ref 13.0–17.0)
Immature Granulocytes: 0 %
Lymphocytes Relative: 15 %
Lymphs Abs: 0.6 10*3/uL — ABNORMAL LOW (ref 0.7–4.0)
MCH: 28.4 pg (ref 26.0–34.0)
MCHC: 31.7 g/dL (ref 30.0–36.0)
MCV: 89.6 fL (ref 80.0–100.0)
Monocytes Absolute: 0.9 10*3/uL (ref 0.1–1.0)
Monocytes Relative: 23 %
Neutro Abs: 2.3 10*3/uL (ref 1.7–7.7)
Neutrophils Relative %: 58 %
Platelet Count: 205 10*3/uL (ref 150–400)
RBC: 3.45 MIL/uL — ABNORMAL LOW (ref 4.22–5.81)
RDW: 20.2 % — ABNORMAL HIGH (ref 11.5–15.5)
WBC Count: 4 10*3/uL (ref 4.0–10.5)
nRBC: 0 % (ref 0.0–0.2)

## 2019-11-04 LAB — CMP (CANCER CENTER ONLY)
ALT: 17 U/L (ref 0–44)
AST: 26 U/L (ref 15–41)
Albumin: 3.5 g/dL (ref 3.5–5.0)
Alkaline Phosphatase: 145 U/L — ABNORMAL HIGH (ref 38–126)
Anion gap: 8 (ref 5–15)
BUN: 26 mg/dL — ABNORMAL HIGH (ref 8–23)
CO2: 22 mmol/L (ref 22–32)
Calcium: 9.4 mg/dL (ref 8.9–10.3)
Chloride: 109 mmol/L (ref 98–111)
Creatinine: 0.9 mg/dL (ref 0.61–1.24)
GFR, Est AFR Am: >60 mL/min (ref >60)
GFR, Estimated: >60 mL/min (ref >60)
Glucose, Bld: 101 mg/dL — ABNORMAL HIGH (ref 70–99)
Potassium: 4.1 mmol/L (ref 3.5–5.1)
Sodium: 139 mmol/L (ref 135–145)
Total Bilirubin: 0.3 mg/dL (ref 0.3–1.2)
Total Protein: 6.4 g/dL — ABNORMAL LOW (ref 6.5–8.1)

## 2019-11-04 LAB — SAMPLE TO BLOOD BANK

## 2019-11-04 MED ORDER — PALONOSETRON HCL INJECTION 0.25 MG/5ML
0.2500 mg | Freq: Once | INTRAVENOUS | Status: AC
  Administered 2019-11-04: 0.25 mg via INTRAVENOUS

## 2019-11-04 MED ORDER — SODIUM CHLORIDE 0.9 % IV SOLN
367.0000 mg | Freq: Once | INTRAVENOUS | Status: AC
  Administered 2019-11-04: 370 mg via INTRAVENOUS
  Filled 2019-11-04: qty 37

## 2019-11-04 MED ORDER — SODIUM CHLORIDE 0.9% FLUSH
10.0000 mL | INTRAVENOUS | Status: DC | PRN
  Administered 2019-11-04: 10 mL
  Filled 2019-11-04: qty 10

## 2019-11-04 MED ORDER — SODIUM CHLORIDE 0.9 % IV SOLN
10.0000 mg | Freq: Once | INTRAVENOUS | Status: AC
  Administered 2019-11-04: 10 mg via INTRAVENOUS
  Filled 2019-11-04: qty 10

## 2019-11-04 MED ORDER — SODIUM CHLORIDE 0.9 % IV SOLN
150.0000 mg | Freq: Once | INTRAVENOUS | Status: AC
  Administered 2019-11-04: 150 mg via INTRAVENOUS
  Filled 2019-11-04: qty 150

## 2019-11-04 MED ORDER — SODIUM CHLORIDE 0.9 % IV SOLN
Freq: Once | INTRAVENOUS | Status: AC
  Filled 2019-11-04: qty 250

## 2019-11-04 MED ORDER — SODIUM CHLORIDE 0.9% FLUSH
10.0000 mL | Freq: Once | INTRAVENOUS | Status: AC
  Administered 2019-11-04: 10 mL
  Filled 2019-11-04: qty 10

## 2019-11-04 MED ORDER — SODIUM CHLORIDE 0.9 % IV SOLN
800.0000 mg/m2 | Freq: Once | INTRAVENOUS | Status: AC
  Administered 2019-11-04: 1292 mg via INTRAVENOUS
  Filled 2019-11-04: qty 33.98

## 2019-11-04 MED ORDER — HEPARIN SOD (PORK) LOCK FLUSH 100 UNIT/ML IV SOLN
500.0000 [IU] | Freq: Once | INTRAVENOUS | Status: AC | PRN
  Administered 2019-11-04: 500 [IU]
  Filled 2019-11-04: qty 5

## 2019-11-04 MED ORDER — PALONOSETRON HCL INJECTION 0.25 MG/5ML
INTRAVENOUS | Status: AC
  Filled 2019-11-04: qty 5

## 2019-11-04 NOTE — Progress Notes (Signed)
  John Holt OFFICE PROGRESS NOTE   Diagnosis:  74 year old man with stage IV bladder cancer with hepatic metastasis diagnosed in April 2021. He was initially diagnosed in 2018  INTERVAL HISTORY:   John Holt returns as scheduled.  He completed cycle 4 gemcitabine/carboplatin beginning 10/14/2019.  He denies nausea/vomiting.  No mouth sores.  No diarrhea.  No fever or rash following gemcitabine.  No dyspnea or cough.  He describes his appetite as "fine".  He reports stable bilateral lower extremity edema.  Objective:  Vital signs in last 24 hours:  Blood pressure 115/78, pulse 73, temperature 97.9 F (36.6 C), temperature source Temporal, resp. rate 16, height 5\' 8"  (1.727 m), weight 129 lb (58.5 kg), SpO2 100 %.    HEENT: No thrush or ulcers. Resp: Lungs clear bilaterally. Cardio: Regular rate and rhythm. GI: Abdomen soft and nontender.  No hepatomegaly. Vascular: Trace edema at the lower legs bilaterally. Neuro: Alert and oriented. Skin: No rash. Port-A-Cath without erythema.   Lab Results:  Lab Results  Component Value Date   WBC 4.0 11/04/2019   HGB 9.8 (L) 11/04/2019   HCT 30.9 (L) 11/04/2019   MCV 89.6 11/04/2019   PLT 205 11/04/2019   NEUTROABS 2.3 11/04/2019    Imaging:  No results found.  Medications: I have reviewed the patient's current medications.  Assessment/Plan: 1. Stage IV bladder cancer diagnosed April 2021 with liver and bone involvement. He is currently on active treatment with gemcitabine/carboplatin. Cycle 4 completed beginning 10/14/2019. 2. Anemia secondary to cancer and chemotherapy. He was transfused 2 units of blood 10/21/2019.  Disposition: John Holt appears stable.  He is currently on active treatment with gemcitabine/carboplatin.  He has completed 4 cycles.  Overall he seems to be tolerating chemotherapy well.  Plan to proceed with cycle 5 today as scheduled.  We reviewed the CBC from today.  Counts adequate to proceed as  above.  He will return for the day 8 gemcitabine in 1 week.  He will return for follow-up in 3 weeks.    Ned Card ANP/GNP-BC   11/04/2019  12:47 PM

## 2019-11-04 NOTE — Patient Instructions (Signed)
Cancer Center Discharge Instructions for Patients Receiving Chemotherapy  Today you received the following chemotherapy agents gemzar/carboplatin  To help prevent nausea and vomiting after your treatment, we encourage you to take your nausea medication as directed   If you develop nausea and vomiting that is not controlled by your nausea medication, call the clinic.   BELOW ARE SYMPTOMS THAT SHOULD BE REPORTED IMMEDIATELY:  *FEVER GREATER THAN 100.5 F  *CHILLS WITH OR WITHOUT FEVER  NAUSEA AND VOMITING THAT IS NOT CONTROLLED WITH YOUR NAUSEA MEDICATION  *UNUSUAL SHORTNESS OF BREATH  *UNUSUAL BRUISING OR BLEEDING  TENDERNESS IN MOUTH AND THROAT WITH OR WITHOUT PRESENCE OF ULCERS  *URINARY PROBLEMS  *BOWEL PROBLEMS  UNUSUAL RASH Items with * indicate a potential emergency and should be followed up as soon as possible.  Feel free to call the clinic you have any questions or concerns. The clinic phone number is [phone].  

## 2019-11-07 ENCOUNTER — Telehealth: Payer: Self-pay | Admitting: *Deleted

## 2019-11-07 DIAGNOSIS — C674 Malignant neoplasm of posterior wall of bladder: Secondary | ICD-10-CM | POA: Diagnosis not present

## 2019-11-07 DIAGNOSIS — E039 Hypothyroidism, unspecified: Secondary | ICD-10-CM | POA: Diagnosis not present

## 2019-11-07 DIAGNOSIS — G40219 Localization-related (focal) (partial) symptomatic epilepsy and epileptic syndromes with complex partial seizures, intractable, without status epilepticus: Secondary | ICD-10-CM | POA: Diagnosis not present

## 2019-11-07 NOTE — Telephone Encounter (Signed)
Received call from pt's wife, [name] reporting that pt has low back pain. They are concerned that pt's pain had gone away & now it has returned. He rates in # 4 right now but was # 6-7 @ 6 am.  He has taken one advil last hs & 1 at 6 am & 2 at 8 am & just now.  He states he gets some relief & doesn't want to take morphine. They would like to know Dr Hazeline Junker opinion about pain returning. Message routed to him.

## 2019-11-07 NOTE — Telephone Encounter (Signed)
He can continue to use advil up to 4 times a day for now. His pain is related to cancer in the bone which can fluctuate. He is to let us know if gets worse later in the week.

## 2019-11-07 NOTE — Telephone Encounter (Addendum)
Called pt's wife & informed of Dr Hazeline Junker message below.

## 2019-11-08 ENCOUNTER — Telehealth: Payer: Self-pay | Admitting: Nurse Practitioner

## 2019-11-08 NOTE — Telephone Encounter (Signed)
Per 7/9 los, no changes made to pt schedule

## 2019-11-09 ENCOUNTER — Encounter (HOSPITAL_COMMUNITY): Payer: Self-pay

## 2019-11-09 ENCOUNTER — Emergency Department (HOSPITAL_COMMUNITY): Payer: Medicare PPO

## 2019-11-09 ENCOUNTER — Other Ambulatory Visit: Payer: Self-pay

## 2019-11-09 ENCOUNTER — Emergency Department (HOSPITAL_COMMUNITY)
Admission: EM | Admit: 2019-11-09 | Discharge: 2019-11-09 | Disposition: A | Discharge status: Home / Self Care | Payer: Medicare PPO | Attending: Emergency Medicine | Admitting: Emergency Medicine

## 2019-11-09 DIAGNOSIS — M546 Pain in thoracic spine: Secondary | ICD-10-CM | POA: Diagnosis not present

## 2019-11-09 DIAGNOSIS — N390 Urinary tract infection, site not specified: Secondary | ICD-10-CM | POA: Insufficient documentation

## 2019-11-09 DIAGNOSIS — Z8551 Personal history of malignant neoplasm of bladder: Secondary | ICD-10-CM | POA: Insufficient documentation

## 2019-11-09 DIAGNOSIS — R6883 Chills (without fever): Secondary | ICD-10-CM | POA: Diagnosis not present

## 2019-11-09 DIAGNOSIS — J3489 Other specified disorders of nose and nasal sinuses: Secondary | ICD-10-CM | POA: Insufficient documentation

## 2019-11-09 DIAGNOSIS — G9589 Other specified diseases of spinal cord: Secondary | ICD-10-CM | POA: Diagnosis not present

## 2019-11-09 DIAGNOSIS — M549 Dorsalgia, unspecified: Secondary | ICD-10-CM | POA: Diagnosis not present

## 2019-11-09 DIAGNOSIS — H579 Unspecified disorder of eye and adnexa: Secondary | ICD-10-CM | POA: Diagnosis not present

## 2019-11-09 DIAGNOSIS — C679 Malignant neoplasm of bladder, unspecified: Secondary | ICD-10-CM | POA: Diagnosis not present

## 2019-11-09 DIAGNOSIS — M545 Low back pain: Secondary | ICD-10-CM | POA: Diagnosis not present

## 2019-11-09 DIAGNOSIS — K5900 Constipation, unspecified: Secondary | ICD-10-CM | POA: Diagnosis not present

## 2019-11-09 DIAGNOSIS — Z87891 Personal history of nicotine dependence: Secondary | ICD-10-CM | POA: Diagnosis not present

## 2019-11-09 LAB — URINALYSIS, ROUTINE W REFLEX MICROSCOPIC
Bilirubin Urine: NEGATIVE
Glucose, UA: NEGATIVE mg/dL
Ketones, ur: NEGATIVE mg/dL
Nitrite: NEGATIVE
Protein, ur: 30 mg/dL — AB
Specific Gravity, Urine: 1.011 (ref 1.005–1.030)
WBC, UA: >50 WBC/hpf — ABNORMAL HIGH (ref 0–5)
pH: 6 (ref 5.0–8.0)

## 2019-11-09 LAB — CBC WITH DIFFERENTIAL/PLATELET
Abs Immature Granulocytes: 0.02 10*3/uL (ref 0.00–0.07)
Basophils Absolute: 0 10*3/uL (ref 0.0–0.1)
Basophils Relative: 1 %
Eosinophils Absolute: 0 10*3/uL (ref 0.0–0.5)
Eosinophils Relative: 1 %
HCT: 30.3 % — ABNORMAL LOW (ref 39.0–52.0)
Hemoglobin: 9.7 g/dL — ABNORMAL LOW (ref 13.0–17.0)
Immature Granulocytes: 1 %
Lymphocytes Relative: 13 %
Lymphs Abs: 0.3 10*3/uL — ABNORMAL LOW (ref 0.7–4.0)
MCH: 28.3 pg (ref 26.0–34.0)
MCHC: 32 g/dL (ref 30.0–36.0)
MCV: 88.3 fL (ref 80.0–100.0)
Monocytes Absolute: 0.2 10*3/uL (ref 0.1–1.0)
Monocytes Relative: 9 %
Neutro Abs: 1.9 10*3/uL (ref 1.7–7.7)
Neutrophils Relative %: 75 %
Platelets: 228 10*3/uL (ref 150–400)
RBC: 3.43 MIL/uL — ABNORMAL LOW (ref 4.22–5.81)
RDW: 19.7 % — ABNORMAL HIGH (ref 11.5–15.5)
WBC: 2.5 10*3/uL — ABNORMAL LOW (ref 4.0–10.5)
nRBC: 0 % (ref 0.0–0.2)

## 2019-11-09 LAB — LACTIC ACID, PLASMA: Lactic Acid, Venous: 0.8 mmol/L (ref 0.5–1.9)

## 2019-11-09 MED ORDER — CEPHALEXIN 500 MG PO CAPS: 500.0000 mg | ORAL_CAPSULE | Freq: Three times a day (TID) | ORAL | 0 refills | Status: AC

## 2019-11-09 MED ORDER — HEPARIN SOD (PORK) LOCK FLUSH 100 UNIT/ML IV SOLN
500.0000 [IU] | Freq: Once | INTRAVENOUS | Status: AC
  Administered 2019-11-09: 500 [IU]
  Filled 2019-11-09: qty 5

## 2019-11-09 MED ORDER — SODIUM CHLORIDE 0.9 % IV SOLN
2.0000 g | Freq: Once | INTRAVENOUS | Status: AC
  Administered 2019-11-09: 2 g via INTRAVENOUS
  Filled 2019-11-09: qty 20

## 2019-11-09 NOTE — ED Provider Notes (Signed)
I saw and evaluated the patient, reviewed the resident's note and I agree with the findings and plan.  EKG:    74 year old male presents with back pain times several days with associated rigors.  Back pain is been upper in nature.  Denies any flank discomfort.  He is currently on chemotherapy.  Concern for possible sepsis.  Work-up initiated   Lacretia Leigh, MD 11/09/19 1946

## 2019-11-09 NOTE — ED Provider Notes (Signed)
[facility] DEPT Provider Note   CSN: [id number] Arrival date & time: 11/09/19  1747     History Chief Complaint  Patient presents with  . Fever  . Chills  . Cancer Patient    John Holt is a 74 y.o. male.  Patient is a 74 year old male with a history of metastatic bladder cancer with spine metastasis currently on chemotherapy who presents after having significant back pain and fever/chills to 101.7 F after starting her fourth round of chemotherapy.  Patient states that on Friday he had the start of his fourth round of chemotherapy and since then started developing some pain in his thoracic spine.  Patient states that he has had chemotherapy before but has not had this pain after the chemotherapy.  He states that he initially started using ibuprofen after speaking with his cancer physician about this pain and had significant pain last night.  He also noted that he had some chills around 2 AM this morning.  Patient states that throughout today his pain has improved and he does not currently have pain in his thoracic spine but noted he was shivering during an afternoon nap and checked his temperature to find it 101.7 F.  Patient states that he has been on this course of chemotherapy before but was told that his side effects will worsen as the number of courses increases.  Patient states that he was told that back pain can be a side effect of this chemotherapy because this is where his metastasis are, and states that fever can be a side effect as well.  Patient denies shortness of breath, diarrhea, nausea, vomiting and states that he did note that his urine was a little bit more thick and milky when he checked it the other day.  Patient states his urination is returned to normal.  Patient is status post radical cystoprostatectomy.       Past Medical History:  Diagnosis Date  . Anxiety disorder   . Bladder cancer [facility]) urologist-- dr Alyson Ingles ([facility]) and dr Lawerance Bach [facility] urology):  oncologist-  dr Alen Blew ([facility]) & dr Marcello Moores [facility])   dx 2018--- s/p  TURBT's ;  hx BCG tx's.  chemotherapy completed 2019, and immunotherapy Keytruda , last one 10-21-2018  . BPH (benign prostatic hypertrophy) with urinary obstruction   . History of adenomatous polyp of colon   . History of closed head injury    2000-- fell off ladder--  temporary memory loss resolved  . History of psychosis    x2  last one documented 2006  w/ hallucination  and suicide ideation  . Nocturia   . Port-A-Cath in place 2018  . S/P placement of VNS (vagus nerve stimulation) device 01/28/2018   per pt swipe magnet twice daily  . Sigmoid diverticulosis   . Sinus bradycardia seen on cardiac monitor    cardiology --  dr g. taylor;   event monitor results 11-15-2018 in epic,  NSR w/ ST and SB and a nocturnal pause   . Temporal lobe epilepsy syndrome [facility]) neurologist-  dr Raliegh Ip. Delice Lesch-  avergae one every 2 weeks "legs jerking around, per wife , pt unaware he's doing this"   localization-related right temporal lobe --  mostly nocturnal w/ bilateral lower extremitity movement, staring and moaning then dissorietation afterwards (12-02-2018 per pt last daytime seizure 11-11-2018 and last nighttime seizure 11-29-2018  . Vesicoureteral reflux, bilateral   . Wears glasses     Patient Active Problem List  Diagnosis Date Noted  . Port-A-Cath in place 08/12/2019  . Goals of care, counseling/discussion 08/04/2019  . Pressure injury of skin 08/01/2019  . Seizure ([place]) 07/31/2019  . Status post ileal conduit ([place]) 02/25/2019  . Bladder cancer ([place]) 02/25/2019  . Sinus bradycardia 10/26/2018  . Malignant neoplasm of posterior wall of urinary bladder ([place]) 02/24/2017  . Benign prostatic hyperplasia with urinary obstruction 08/18/2016  . ED (erectile dysfunction) of organic origin 08/18/2016  . Focal epilepsy with impairment of consciousness, intractable ([place]) 04/30/2014  . Memory loss  04/30/2014  . Localization-related symptomatic epilepsy and epileptic syndromes with complex partial seizures, intractable, without status epilepticus ([place]) 08/11/2012    Past Surgical History:  Procedure Laterality Date  . COLONOSCOPY    . COLONOSCOPY W/ POLYPECTOMY  09-11-2009  . CYSTOSCOPY W/ RETROGRADES Bilateral 08/13/2018   Procedure: CYSTOSCOPY WITH RETROGRADE PYELOGRAM;  Surgeon: Cleon Gustin, MD;  Location: [facility];  Service: Urology;  Laterality: Bilateral;  . CYSTOSCOPY WITH INJECTION N/A 02/25/2019   Procedure: CYSTOSCOPY WITH INJECTION;  Surgeon: Alexis Frock, MD;  Location: [facility];  Service: Urology;  Laterality: N/A;  . GREEN LIGHT LASER TURP (TRANSURETHRAL RESECTION OF PROSTATE N/A 04/07/2014   Procedure: GREEN LIGHT LASER TURP (TRANSURETHRAL RESECTION OF PROSTATE;  Surgeon: Ailene Rud, MD;  Location: [facility];  Service: Urology;  Laterality: N/A;  . INGUINAL HERNIA REPAIR Bilateral 04/15/2013   Procedure: LAPAROSCOPIC BILATERAL INGUINAL HERNIA REPAIR;  Surgeon: Gayland Curry, MD;  Location: [facility];  Service: General;  Laterality: Bilateral;  . INSERTION OF MESH N/A 04/15/2013   Procedure: INSERTION OF MESH;  Surgeon: Gayland Curry, MD;  Location: [facility];  Service: General;  Laterality: N/A;  . PORTACATH PLACEMENT  2018  . TRANSURETHRAL RESECTION OF BLADDER  01-27-2017;  05-26-2017;  05-25-2018  both by dr Chriss Czar davis @[facility]   . TRANSURETHRAL RESECTION OF BLADDER TUMOR N/A 08/13/2018   Procedure: TRANSURETHRAL RESECTION OF BLADDER TUMOR (TURBT);  Surgeon: Cleon Gustin, MD;  Location: [facility];  Service: Urology;  Laterality: N/A;  1 HR  . TRANSURETHRAL RESECTION OF BLADDER TUMOR N/A 12/03/2018   Procedure: TRANSURETHRAL RESECTION OF BLADDER TUMOR (TURBT);  Surgeon: Cleon Gustin, MD;  Location: [facility];  Service: Urology;  Laterality: N/A;  . UMBILICAL HERNIA REPAIR N/A 04/15/2013   Procedure: OPEN HERNIA REPAIR  UMBILICAL ADULT;  Surgeon: Gayland Curry, MD;  Location: [facility];  Service: General;  Laterality: N/A;  . VAGUS NERVE STIMULATOR INSERTION Left 01/28/2018   Procedure: VAGAL NERVE STIMULATOR PLACEMENT;  Surgeon: Consuella Lose, MD;  Location: [facility];  Service: Neurosurgery;  Laterality: Left;  VAGAL NERVE STIMULATOR PLACEMENT       Family History  Problem Relation Age of Onset  . Seizures Father   . Heart disease Father   . Cancer Father        prostat  . Cancer Mother        stomach  . Stomach cancer Mother   . Colon cancer Neg Hx   . Colon polyps Neg Hx   . Esophageal cancer Neg Hx   . Rectal cancer Neg Hx     Social History   Tobacco Use  . Smoking status: Former Smoker    Packs/day: 1.00    Years: 15.00    Pack years: 15.00    Types: Cigarettes    Quit date: 04/03/1972    Years since quitting: 47.6  . Smokeless tobacco: Never Used  Vaping Use  .  Vaping Use: Never used  Substance Use Topics  . Alcohol use: Yes    Alcohol/week: 14.0 standard drinks    Types: 14 Glasses of wine per week    Comment: 1 or 2  wine daily; no consumption in last 24 hours  . Drug use: No    Home Medications Prior to Admission medications   Medication Sig Start Date End Date Taking? Authorizing Provider  cloBAZam (ONFI) 10 MG tablet Take 1 tablet every night Patient taking differently: Take 10 mg by mouth daily.  09/05/19  Yes Cameron Sprang, MD  clonazePAM (KLONOPIN) 0.5 MG tablet Take 1 tablet as needed for seizure cluster. Do not take more than 2-3 a week Patient taking differently: Take 0.5 mg by mouth daily as needed (for seizure cluster). Do not take more than 2-3 a week 09/05/19  Yes Cameron Sprang, MD  ibuprofen (ADVIL) 200 MG tablet Take 400 mg by mouth every 6 (six) hours as needed for moderate pain.   Yes [provider]  levothyroxine (SYNTHROID) 137 MCG tablet Take 137 mcg by mouth every morning. 09/06/19  Yes [provider]  Multiple Vitamin (MULTIVITAMIN  WITH MINERALS) TABS tablet Take 1 tablet by mouth in the morning and at bedtime.    Yes [provider]  polyethylene glycol (MIRALAX / GLYCOLAX) 17 g packet Take 17 g by mouth daily. Patient taking differently: Take 17 g by mouth daily as needed for mild constipation or moderate constipation.  07/29/19  Yes Jacqlyn Larsen, PA-C  zonisamide (ZONEGRAN) 100 MG capsule TAKE 3 CAPSULES EACH NIGHT Patient taking differently: Take 300 mg by mouth at bedtime.  09/05/19  Yes Cameron Sprang, MD  cephALEXin (KEFLEX) 500 MG capsule Take 1 capsule (500 mg total) by mouth 3 (three) times daily for 7 days. 11/10/19 11/17/19  Lurline Del, DO  dexamethasone (DECADRON) 4 MG tablet Take 1 tablet (4 mg total) by mouth 2 (two) times daily with a meal. Patient not taking: Reported on 11/09/2019 07/29/19   Jacqlyn Larsen, PA-C  ondansetron (ZOFRAN ODT) 4 MG disintegrating tablet 4mg  ODT q4 hours prn nausea/vomit Patient not taking: Reported on 11/09/2019 07/29/19   Jacqlyn Larsen, PA-C  prochlorperazine (COMPAZINE) 10 MG tablet Take 1 tablet (10 mg total) by mouth every 6 (six) hours as needed for nausea or vomiting. Patient not taking: Reported on 09/23/2019 08/04/19   Wyatt Portela, MD  senna (SENOKOT) 8.6 MG TABS tablet Take 2 tablets (17.2 mg total) by mouth 2 (two) times daily. Patient not taking: Reported on 11/09/2019 08/02/19   Micheline Rough, MD    Allergies    Chlorhexidine and Povidone-iodine  Review of Systems   Review of Systems  Constitutional: Positive for chills and fever.  HENT: Positive for rhinorrhea. Negative for sore throat.   Eyes: Positive for redness (Left eye, nonpainful).  Respiratory: Negative for chest tightness and shortness of breath.   Cardiovascular: Negative for chest pain.  Gastrointestinal: Positive for constipation. Negative for abdominal pain, diarrhea, nausea and vomiting.  Genitourinary:       Patient without a bladder, did note some thick and milky urine in his bag for 1 day  which is since resolved.  Musculoskeletal: Positive for back pain.  Skin: Negative for rash.  Neurological: Negative for headaches.    Physical Exam Updated Vital Signs BP (!) 103/53   Pulse 83   Temp 99.7 F (37.6 C)   Resp 18   SpO2 100%   Physical  Exam Constitutional:      Appearance: Normal appearance.  HENT:     Head: Normocephalic and atraumatic.  Eyes:     Pupils: Pupils are equal, round, and reactive to light.     Comments: Subconjunctival hemorrhage present in left eye  Cardiovascular:     Rate and Rhythm: Normal rate and regular rhythm.     Heart sounds: No murmur heard.   Pulmonary:     Effort: Pulmonary effort is normal. No respiratory distress.     Breath sounds: Normal breath sounds. No wheezing or rales.  Abdominal:     General: Abdomen is flat. Bowel sounds are normal.     Palpations: Abdomen is soft.     Tenderness: There is no abdominal tenderness. There is no guarding.  Genitourinary:    Comments: Normal-appearing urine in bag Musculoskeletal:        General: Tenderness (Mild tenderness to lumbar spine on palpation, no tenderness to palpation of thoracic spine.) present.     Cervical back: Normal range of motion and neck supple.  Skin:    General: Skin is warm and dry.  Neurological:     General: No focal deficit present.     Mental Status: He is alert.     Cranial Nerves: No cranial nerve deficit.  Psychiatric:        Mood and Affect: Mood normal.        Behavior: Behavior normal.     ED Results / Procedures / Treatments   Labs (all labs ordered are listed, but only abnormal results are displayed) Labs Reviewed  CBC WITH DIFFERENTIAL/PLATELET - Abnormal; Notable for the following components:      Result Value   WBC 2.5 (*)    RBC 3.43 (*)    Hemoglobin 9.7 (*)    HCT 30.3 (*)    RDW 19.7 (*)    Lymphs Abs 0.3 (*)    All other components within normal limits  URINALYSIS, ROUTINE W REFLEX MICROSCOPIC - Abnormal; Notable for the  following components:   APPearance CLOUDY (*)    Hgb urine dipstick SMALL (*)    Protein, ur 30 (*)    Leukocytes,Ua LARGE (*)    WBC, UA >50 (*)    Bacteria, UA RARE (*)    All other components within normal limits  CULTURE, BLOOD (ROUTINE X 2)  CULTURE, BLOOD (ROUTINE X 2)  LACTIC ACID, PLASMA    EKG None  Radiology DG Chest 2 View  Result Date: 11/09/2019 CLINICAL DATA:  Back pain, metastatic bladder cancer EXAM: CHEST - 2 VIEW COMPARISON:  07/28/2019 FINDINGS: Lungs are clear. No pneumothorax or pleural effusion. Cardiac size within normal limits. Pathologic fractures of T3 and T9 are again identified, better appreciated on prior CT examination. There is increasing paraspinal soft tissue thickening in the region of T9 suggesting a progressive soft tissue mass or hematoma in this region. Pulmonary vascularity is normal. Right internal jugular chest port tip noted within the right atrium. Left chest implanted neurostimulator seen with its lead overlying the left first costovertebral junction. IMPRESSION: Progressive left paraspinal soft tissue thickening in the region of known pathologic fracture suggesting a progressive hematoma or malignant soft tissue mass in this location. This could be better assessed with CT or MRI examination. Electronically Signed   By: Fidela Salisbury MD   On: 11/09/2019 21:02   DG Thoracic Spine 2 View  Result Date: 11/09/2019 CLINICAL DATA:  Metastatic bladder cancer, back pain EXAM: THORACIC SPINE 2 VIEWS  COMPARISON:  CT 10/12/2019 FINDINGS: Pathologic compression fractures of T3 and T9 are again identified with resultant focal kyphosis at T9 secondary to approximately 60-70% loss of height and wedge deformity of the T9 vertebral body. This appears stable since prior CT examination. There is paravertebral soft tissue thickening, however, at the T9 vertebral body which appears more prominent than noted on prior CT examination and a associated soft tissue mass is  difficult to exclude. No new fracture identified. No listhesis identified. Right internal jugular chest port tip noted within the right atrium. IMPRESSION: Pathologic fractures of T3 and T9 are identified, however, there appears to be progressive paraspinal soft tissue thickening at T9 raising the question of a a progressive soft tissue mass in this region. This could be better assessed with CT or MRI examination. Electronically Signed   By: Fidela Salisbury MD   On: 11/09/2019 21:00   DG Lumbar Spine Complete  Result Date: 11/09/2019 CLINICAL DATA:  Back pain bladder cancer EXAM: LUMBAR SPINE - COMPLETE 4+ VIEW COMPARISON:  None. FINDINGS: Five non rib bearing segments of the lumbar spine. Normal lumbar lordosis. Vertebral body height has been preserved. No fracture or listhesis of the lumbar spine. There is mild intervertebral disc space narrowing and endplate remodeling at G0-1 and L3-4 in keeping with changes of mild degenerative disc disease. Oblique views demonstrate no evidence of pars defect. The paraspinal soft tissues are unremarkable. IMPRESSION: Negative. Electronically Signed   By: Fidela Salisbury MD   On: 11/09/2019 20:53    Procedures Procedures (including critical care time)  Medications Ordered in ED Medications  cefTRIAXone (ROCEPHIN) 2 g in sodium chloride 0.9 % 100 mL IVPB (0 g Intravenous Stopped 11/09/19 2205)  heparin lock flush 100 unit/mL (500 Units Intracatheter Given 11/09/19 2211)    ED Course  I have reviewed the triage vital signs and the nursing notes.  Pertinent labs & imaging results that were available during my care of the patient were reviewed by me and considered in my medical decision making (see chart for details).    MDM Rules/Calculators/A&P                          74 year old male with a history of metastatic bladder cancer presenting with one day of chills, fever to 101.7, and thoracic back pain which initiated after starting his fourth round of  chemotherapy.  The most concerning possibility at this time would be an infection due to patient history of leukopenia.  Will check CBC, blood lactate, chest x-ray, lumbar and thoracic spine imaging, urinalysis, and get blood cultures. Lactic acid 0.8.  CBC notable for WBC decreased at 2.5 with neutrophil count 1.9.  Patient's hemoglobin seems consistent with previous values of 9.7. Lumbar spine x-ray negative for acute findings.Thoracic spine x-ray shows pathologic fractures at T3 and T9 with progressive paraspinal soft tissue thickening at T9 which raises the question of a progressive soft tissue mass and states this could be better assessed with CT or MRI. Chest x-ray shows progressive left paraspinal soft tissue thickening in the region of the known pathologic fracture suggestion of progressive hematoma versus malignant soft tissue mass which could be better evaluated with CT or MRI.  Urinalysis does show cloudy urine with small hemoglobin, large leukocyte esterase, rare bacteria, negative nitrite.  This coupled with the fact the patient noticed some thick white discharge in his urine recently makes urinary tract infection suspect.  Will dose with 2 g ceftriaxone.  Called and discussed the case with the on-call oncologist.  We will plan to discharge with outpatient treatment for urinary tract infection and close follow-up with patient's oncologist.  Patient discharged with Keflex for urinary tract infection.  Final Clinical Impression(s) / ED Diagnoses Final diagnoses:  Urinary tract infection without hematuria, site unspecified  Chills    Rx / DC Orders ED Discharge Orders         Ordered    cephALEXin (KEFLEX) 500 MG capsule  3 times daily     Discontinue  Reprint     11/09/19 2146           Lurline Del, DO 11/09/19 2212    Lacretia Leigh, MD 11/10/19 [id number]

## 2019-11-09 NOTE — Discharge Instructions (Addendum)
You came to the emergency department after having a fever and back pain that started after getting your chemotherapy on Friday.  In the emergency department you were evaluated with multiple x-rays as well as lab work.  We also checked your urine which showed signs consistent with a urinary tract infection.  This also matches your symptoms of having some pus noted in the bag with your urine recently.  After speaking with the on-call oncologist we determined it would be safe and appropriate to discharge you home with oral antibiotics for the urinary tract infection and close follow-up with your oncologist.  We did give you an IV antibiotic prior to discharge.  If you do not hear from your oncologist's office in the next 2 days I recommend calling to make a follow-up appointment.  Of note, your thoracic x-ray spine did show pathologic fractures at T3 and T9 with progressive paraspinal soft tissue thickening at T9 and stated that raises the question of a progressive soft tissue mass which could be better assessed with CT or MRI if your oncologist deems this appropriate.

## 2019-11-09 NOTE — ED Triage Notes (Signed)
Pt presents with c/o fever, chills, and back pain. Pt states his fever got up to 101.7, took 400mg  of Ibuprofen at 1700. Pt states his chills have stopped, however, pt states he feels weaker than normal and is continuing to have back pain. Pt has bladder cancer with metastases to spine and liver. Last chemo treatment on Friday. Pt is A&Ox4.

## 2019-11-10 ENCOUNTER — Other Ambulatory Visit: Payer: Self-pay

## 2019-11-10 ENCOUNTER — Emergency Department (HOSPITAL_COMMUNITY)
Admission: EM | Admit: 2019-11-10 | Discharge: 2019-11-11 | Disposition: A | Discharge status: Home / Self Care | Payer: Medicare PPO | Attending: Emergency Medicine | Admitting: Emergency Medicine

## 2019-11-10 ENCOUNTER — Telehealth: Payer: Self-pay

## 2019-11-10 ENCOUNTER — Emergency Department (HOSPITAL_COMMUNITY): Payer: Medicare PPO

## 2019-11-10 ENCOUNTER — Encounter (HOSPITAL_COMMUNITY): Payer: Self-pay

## 2019-11-10 DIAGNOSIS — C787 Secondary malignant neoplasm of liver and intrahepatic bile duct: Secondary | ICD-10-CM | POA: Diagnosis not present

## 2019-11-10 DIAGNOSIS — R509 Fever, unspecified: Secondary | ICD-10-CM | POA: Diagnosis not present

## 2019-11-10 DIAGNOSIS — N390 Urinary tract infection, site not specified: Secondary | ICD-10-CM | POA: Insufficient documentation

## 2019-11-10 DIAGNOSIS — Z87891 Personal history of nicotine dependence: Secondary | ICD-10-CM | POA: Insufficient documentation

## 2019-11-10 DIAGNOSIS — C679 Malignant neoplasm of bladder, unspecified: Secondary | ICD-10-CM | POA: Diagnosis not present

## 2019-11-10 DIAGNOSIS — K449 Diaphragmatic hernia without obstruction or gangrene: Secondary | ICD-10-CM | POA: Diagnosis not present

## 2019-11-10 DIAGNOSIS — R001 Bradycardia, unspecified: Secondary | ICD-10-CM | POA: Diagnosis not present

## 2019-11-10 LAB — CBC WITH DIFFERENTIAL/PLATELET
Abs Immature Granulocytes: 0.04 10*3/uL (ref 0.00–0.07)
Basophils Absolute: 0 10*3/uL (ref 0.0–0.1)
Basophils Relative: 0 %
Eosinophils Absolute: 0 10*3/uL (ref 0.0–0.5)
Eosinophils Relative: 0 %
HCT: 24.8 % — ABNORMAL LOW (ref 39.0–52.0)
Hemoglobin: 8 g/dL — ABNORMAL LOW (ref 13.0–17.0)
Immature Granulocytes: 1 %
Lymphocytes Relative: 13 %
Lymphs Abs: 0.5 10*3/uL — ABNORMAL LOW (ref 0.7–4.0)
MCH: 28.6 pg (ref 26.0–34.0)
MCHC: 32.3 g/dL (ref 30.0–36.0)
MCV: 88.6 fL (ref 80.0–100.0)
Monocytes Absolute: 0.6 10*3/uL (ref 0.1–1.0)
Monocytes Relative: 17 %
Neutro Abs: 2.3 10*3/uL (ref 1.7–7.7)
Neutrophils Relative %: 69 %
Platelets: 198 10*3/uL (ref 150–400)
RBC: 2.8 MIL/uL — ABNORMAL LOW (ref 4.22–5.81)
RDW: 19.5 % — ABNORMAL HIGH (ref 11.5–15.5)
WBC: 3.4 10*3/uL — ABNORMAL LOW (ref 4.0–10.5)
nRBC: 0 % (ref 0.0–0.2)

## 2019-11-10 LAB — COMPREHENSIVE METABOLIC PANEL
ALT: 27 U/L (ref 0–44)
AST: 32 U/L (ref 15–41)
Albumin: 3.2 g/dL — ABNORMAL LOW (ref 3.5–5.0)
Alkaline Phosphatase: 93 U/L (ref 38–126)
Anion gap: 10 (ref 5–15)
BUN: 23 mg/dL (ref 8–23)
CO2: 22 mmol/L (ref 22–32)
Calcium: 8.2 mg/dL — ABNORMAL LOW (ref 8.9–10.3)
Chloride: 102 mmol/L (ref 98–111)
Creatinine, Ser: 0.73 mg/dL (ref 0.61–1.24)
GFR calc Af Amer: >60 mL/min (ref >60)
GFR calc non Af Amer: >60 mL/min (ref >60)
Glucose, Bld: 97 mg/dL (ref 70–99)
Potassium: 4.1 mmol/L (ref 3.5–5.1)
Sodium: 134 mmol/L — ABNORMAL LOW (ref 135–145)
Total Bilirubin: 0.4 mg/dL (ref 0.3–1.2)
Total Protein: 5.9 g/dL — ABNORMAL LOW (ref 6.5–8.1)

## 2019-11-10 LAB — URINALYSIS, ROUTINE W REFLEX MICROSCOPIC
Bilirubin Urine: NEGATIVE
Glucose, UA: NEGATIVE mg/dL
Ketones, ur: NEGATIVE mg/dL
Nitrite: NEGATIVE
Protein, ur: 30 mg/dL — AB
Specific Gravity, Urine: 1.013 (ref 1.005–1.030)
WBC, UA: >50 WBC/hpf — ABNORMAL HIGH (ref 0–5)
pH: 5 (ref 5.0–8.0)

## 2019-11-10 LAB — APTT: aPTT: 32 seconds (ref 24–36)

## 2019-11-10 LAB — LACTIC ACID, PLASMA: Lactic Acid, Venous: 0.6 mmol/L (ref 0.5–1.9)

## 2019-11-10 LAB — PROTIME-INR
INR: 1.1 (ref 0.8–1.2)
Prothrombin Time: 13.7 seconds (ref 11.4–15.2)

## 2019-11-10 MED ORDER — SODIUM CHLORIDE 0.9 % IV SOLN
2.0000 g | Freq: Once | INTRAVENOUS | Status: AC
  Administered 2019-11-10: 2 g via INTRAVENOUS
  Filled 2019-11-10: qty 20

## 2019-11-10 MED ORDER — ACETAMINOPHEN 325 MG PO TABS
650.0000 mg | ORAL_TABLET | Freq: Once | ORAL | Status: AC
  Administered 2019-11-10: 650 mg via ORAL
  Filled 2019-11-10: qty 2

## 2019-11-10 MED ORDER — SODIUM CHLORIDE 0.9 % IV SOLN
2.0000 g | Freq: Once | INTRAVENOUS | Status: DC
  Filled 2019-11-10: qty 2

## 2019-11-10 MED ORDER — VANCOMYCIN HCL IN DEXTROSE 1-5 GM/200ML-% IV SOLN
1000.0000 mg | Freq: Once | INTRAVENOUS | Status: DC
  Filled 2019-11-10: qty 200

## 2019-11-10 NOTE — Telephone Encounter (Signed)
-----   Message from Wyatt Portela, MD sent at 11/10/2019 12:25 PM EDT ----- If his fevers and chills persist, he needs to go back to the ED. Thanks ----- Message ----- From: Tami Lin, RN Sent: 11/10/2019  11:49 AM EDT To: Wyatt Portela, MD  Patient's wife called and wants to make you aware patient was seen in the ED yesterday for fevers and was diagnosed with UTI. Patient received IV antibiotics and was discharged. Per wife patient continues to have temps- 101.8 this am and down to 101.0 recently, c/o chills, and not feeling well. Patient is scheduled for a lab, port flush and D8 C5 Gemzar tomorrow.  Lanelle Bal

## 2019-11-10 NOTE — ED Triage Notes (Signed)
Pt reports continued fever, chills, and back pain. Took 400mg  of ibuprofen at 7p. Was seen here for same last night with no improvement. Last chemo was Friday 7/9.

## 2019-11-10 NOTE — ED Provider Notes (Signed)
John Holt   CSN: [id number] Arrival date & time: 11/10/19  1954     History Chief Complaint  Patient presents with  . Fever    Chemo Pt    John Holt is a 74 y.o. male.  The history is provided by the patient.  Fever Max temp prior to arrival:  102 Temp source:  Oral Severity:  Mild Onset quality:  Gradual Duration:  2 days Timing:  Constant Progression:  Unchanged Chronicity:  New Relieved by:  Nothing Worsened by:  Nothing Associated symptoms: no chest pain, no chills, no confusion, no congestion, no cough, no diarrhea, no dysuria, no ear pain, no headaches, no myalgias, no nausea, no rash, no rhinorrhea, no somnolence, no sore throat and no vomiting   Risk factors: hx of cancer (on chemo for bladder cancer with urostomy. Dx with UTI yesterday but unable to fill antibiotic until this afternoon due to lab error)        Past Medical History:  Diagnosis Date  . Anxiety disorder   . Bladder cancer [facility]) urologist-- dr Alyson Ingles ([facility]) and dr Lawerance Bach [facility]rology):  oncologist-  dr Alen Blew ([place]) & dr Marcello Moores [facility])   dx 2018--- s/p  TURBT's ;  hx BCG tx's.  chemotherapy completed 2019, and immunotherapy Keytruda , last one 10-21-2018  . BPH (benign prostatic hypertrophy) with urinary obstruction   . History of adenomatous polyp of colon   . History of closed head injury    2000-- fell off ladder--  temporary memory loss resolved  . History of psychosis    x2  last one documented 2006  w/ hallucination  and suicide ideation  . Nocturia   . Port-A-Cath in place 2018  . S/P placement of VNS (vagus nerve stimulation) device 01/28/2018   per pt swipe magnet twice daily  . Sigmoid diverticulosis   . Sinus bradycardia seen on cardiac monitor    cardiology --  dr g. taylor;   event monitor results 11-15-2018 in epic,  NSR w/ ST and SB and a nocturnal pause   . Temporal lobe epilepsy syndrome [facility])  neurologist-  dr Raliegh Ip. Delice Lesch-  avergae one every 2 weeks "legs jerking around, per wife , pt unaware he's doing this"   localization-related right temporal lobe --  mostly nocturnal w/ bilateral lower extremitity movement, staring and moaning then dissorietation afterwards (12-02-2018 per pt last daytime seizure 11-11-2018 and last nighttime seizure 11-29-2018  . Vesicoureteral reflux, bilateral   . Wears glasses     Patient Active Problem List   Diagnosis Date Noted  . Port-A-Cath in place 08/12/2019  . Goals of care, counseling/discussion 08/04/2019  . Pressure injury of skin 08/01/2019  . Seizure ([place]) 07/31/2019  . Status post ileal conduit ([place]) 02/25/2019  . Bladder cancer ([place]) 02/25/2019  . Sinus bradycardia 10/26/2018  . Malignant neoplasm of posterior wall of urinary bladder ([place]) 02/24/2017  . Benign prostatic hyperplasia with urinary obstruction 08/18/2016  . ED (erectile dysfunction) of organic origin 08/18/2016  . Focal epilepsy with impairment of consciousness, intractable ([place]) 04/30/2014  . Memory loss 04/30/2014  . Localization-related symptomatic epilepsy and epileptic syndromes with complex partial seizures, intractable, without status epilepticus ([place]) 08/11/2012    Past Surgical History:  Procedure Laterality Date  . COLONOSCOPY    . COLONOSCOPY W/ POLYPECTOMY  09-11-2009  . CYSTOSCOPY W/ RETROGRADES Bilateral 08/13/2018   Procedure: CYSTOSCOPY WITH RETROGRADE PYELOGRAM;  Surgeon: Cleon Gustin, MD;  Location: [facility];  Service: Urology;  Laterality: Bilateral;  . CYSTOSCOPY WITH INJECTION N/A 02/25/2019   Procedure: CYSTOSCOPY WITH INJECTION;  Surgeon: Alexis Frock, MD;  Location: [facility];  Service: Urology;  Laterality: N/A;  . GREEN LIGHT LASER TURP (TRANSURETHRAL RESECTION OF PROSTATE N/A 04/07/2014   Procedure: GREEN LIGHT LASER TURP (TRANSURETHRAL RESECTION OF PROSTATE;  Surgeon: Ailene Rud, MD;  Location[facility];  Service:  Urology;  Laterality: N/A;  . INGUINAL HERNIA REPAIR Bilateral 04/15/2013   Procedure: LAPAROSCOPIC BILATERAL INGUINAL HERNIA REPAIR;  Surgeon: Gayland Curry, MD;  Location: [facility];  Service: General;  Laterality: Bilateral;  . INSERTION OF MESH N/A 04/15/2013   Procedure: INSERTION OF MESH;  Surgeon: Gayland Curry, MD;  Location: [facility];  Service: General;  Laterality: N/A;  . PORTACATH PLACEMENT  2018  . TRANSURETHRAL RESECTION OF BLADDER  01-27-2017;  05-26-2017;  05-25-2018  both by dr Chriss Czar davis @[facility]   . TRANSURETHRAL RESECTION OF BLADDER TUMOR N/A 08/13/2018   Procedure: TRANSURETHRAL RESECTION OF BLADDER TUMOR (TURBT);  Surgeon: Cleon Gustin, MD;  Location: [facility];  Service: Urology;  Laterality: N/A;  1 HR  . TRANSURETHRAL RESECTION OF BLADDER TUMOR N/A 12/03/2018   Procedure: TRANSURETHRAL RESECTION OF BLADDER TUMOR (TURBT);  Surgeon: Cleon Gustin, MD;  Location: [facility];  Service: Urology;  Laterality: N/A;  . UMBILICAL HERNIA REPAIR N/A 04/15/2013   Procedure: OPEN HERNIA REPAIR UMBILICAL ADULT;  Surgeon: Gayland Curry, MD;  Location: [facility];  Service: General;  Laterality: N/A;  . VAGUS NERVE STIMULATOR INSERTION Left 01/28/2018   Procedure: VAGAL NERVE STIMULATOR PLACEMENT;  Surgeon: Consuella Lose, MD;  Location: [place];  Service: Neurosurgery;  Laterality: Left;  VAGAL NERVE STIMULATOR PLACEMENT       Family History  Problem Relation Age of Onset  . Seizures Father   . Heart disease Father   . Cancer Father        prostat  . Cancer Mother        stomach  . Stomach cancer Mother   . Colon cancer Neg Hx   . Colon polyps Neg Hx   . Esophageal cancer Neg Hx   . Rectal cancer Neg Hx     Social History   Tobacco Use  . Smoking status: Former Smoker    Packs/day: 1.00    Years: 15.00    Pack years: 15.00    Types: Cigarettes    Quit date: 04/03/1972    Years since quitting: 47.6  . Smokeless tobacco: Never Used  Vaping Use  . Vaping  Use: Never used  Substance Use Topics  . Alcohol use: Yes    Alcohol/week: 14.0 standard drinks    Types: 14 Glasses of wine per week    Comment: 1 or 2  wine daily; no consumption in last 24 hours  . Drug use: No    Home Medications Prior to Admission medications   Medication Sig Start Date End Date Taking? Authorizing Provider  cloBAZam (ONFI) 10 MG tablet Take 1 tablet every night Patient taking differently: Take 10 mg by mouth daily.  09/05/19  Yes Cameron Sprang, MD  clonazePAM (KLONOPIN) 0.5 MG tablet Take 1 tablet as needed for seizure cluster. Do not take more than 2-3 a week Patient taking differently: Take 0.5 mg by mouth daily as needed (for seizure cluster). Do not take more than 2-3 a week 09/05/19  Yes Cameron Sprang, MD  ibuprofen (ADVIL) 200 MG  tablet Take 400 mg by mouth every 6 (six) hours as needed for moderate pain.   Yes [provider]  levothyroxine (SYNTHROID) 137 MCG tablet Take 137 mcg by mouth every morning. 09/06/19  Yes [provider]  Multiple Vitamin (MULTIVITAMIN WITH MINERALS) TABS tablet Take 1 tablet by mouth in the morning and at bedtime.    Yes [provider]  polyethylene glycol (MIRALAX / GLYCOLAX) 17 g packet Take 17 g by mouth daily. Patient taking differently: Take 17 g by mouth daily as needed for mild constipation or moderate constipation.  07/29/19  Yes Jacqlyn Larsen, PA-C  zonisamide (ZONEGRAN) 100 MG capsule TAKE 3 CAPSULES EACH NIGHT Patient taking differently: Take 300 mg by mouth at bedtime.  09/05/19  Yes Cameron Sprang, MD  cephALEXin (KEFLEX) 500 MG capsule Take 1 capsule (500 mg total) by mouth 3 (three) times daily for 7 days. 11/10/19 11/17/19  Lurline Del, DO  dexamethasone (DECADRON) 4 MG tablet Take 1 tablet (4 mg total) by mouth 2 (two) times daily with a meal. Patient not taking: Reported on 11/09/2019 07/29/19   Jacqlyn Larsen, PA-C  ondansetron (ZOFRAN ODT) 4 MG disintegrating tablet 4mg  ODT q4 hours prn  nausea/vomit Patient not taking: Reported on 11/09/2019 07/29/19   Jacqlyn Larsen, PA-C  prochlorperazine (COMPAZINE) 10 MG tablet Take 1 tablet (10 mg total) by mouth every 6 (six) hours as needed for nausea or vomiting. Patient not taking: Reported on 09/23/2019 08/04/19   Wyatt Portela, MD  senna (SENOKOT) 8.6 MG TABS tablet Take 2 tablets (17.2 mg total) by mouth 2 (two) times daily. Patient not taking: Reported on 11/09/2019 08/02/19   Micheline Rough, MD    Allergies    Chlorhexidine and Povidone-iodine  Review of Systems   Review of Systems  Constitutional: Positive for fever. Negative for chills.  HENT: Negative for congestion, ear pain, rhinorrhea and sore throat.   Eyes: Negative for pain and visual disturbance.  Respiratory: Negative for cough and shortness of breath.   Cardiovascular: Negative for chest pain and palpitations.  Gastrointestinal: Negative for abdominal pain, diarrhea, nausea and vomiting.  Genitourinary: Negative for dysuria and hematuria.  Musculoskeletal: Negative for arthralgias, back pain and myalgias.  Skin: Negative for color change and rash.  Neurological: Negative for seizures, syncope and headaches.  Psychiatric/Behavioral: Negative for confusion.  All other systems reviewed and are negative.   Physical Exam Updated Vital Signs  ED Triage Vitals  Enc Vitals Group     BP 11/10/19 2012 140/83     Pulse Rate 11/10/19 2012 99     Resp 11/10/19 2012 16     Temp 11/10/19 2012 (!) 102.5 F (39.2 C)     Temp Source 11/10/19 2012 Oral     SpO2 11/10/19 2012 100 %     Weight --      Height --      Head Circumference --      Peak Flow --      Pain Score 11/10/19 2026 6     Pain Loc --      Pain Edu? --      Excl. in [place]? --     Physical Exam Vitals and nursing Holt reviewed.  Constitutional:      General: He is not in acute distress.    Appearance: He is well-developed. He is not ill-appearing.  HENT:     Head: Normocephalic and atraumatic.      Nose: Nose normal.  Mouth/Throat:     Mouth: Mucous membranes are moist.  Eyes:     Extraocular Movements: Extraocular movements intact.     Conjunctiva/sclera: Conjunctivae normal.     Pupils: Pupils are equal, round, and reactive to light.  Cardiovascular:     Rate and Rhythm: Normal rate and regular rhythm.     Pulses: Normal pulses.     Heart sounds: Normal heart sounds. No murmur heard.   Pulmonary:     Effort: Pulmonary effort is normal. No respiratory distress.     Breath sounds: Normal breath sounds.  Abdominal:     General: Abdomen is flat.     Palpations: Abdomen is soft.     Tenderness: There is no abdominal tenderness.     Comments: Urostomy in place   Musculoskeletal:        General: No swelling or tenderness. Normal range of motion.     Cervical back: Normal range of motion and neck supple. No tenderness.  Skin:    General: Skin is warm and dry.     Capillary Refill: Capillary refill takes less than 2 seconds.     Comments: Port without any signs of infection  Neurological:     General: No focal deficit present.     Mental Status: He is alert.  Psychiatric:        Mood and Affect: Mood normal.     ED Results / Procedures / Treatments   Labs (all labs ordered are listed, but only abnormal results are displayed) Labs Reviewed  COMPREHENSIVE METABOLIC PANEL - Abnormal; Notable for the following components:      Result Value   Sodium 134 (*)    Calcium 8.2 (*)    Total Protein 5.9 (*)    Albumin 3.2 (*)    All other components within normal limits  CBC WITH DIFFERENTIAL/PLATELET - Abnormal; Notable for the following components:   WBC 3.4 (*)    RBC 2.80 (*)    Hemoglobin 8.0 (*)    HCT 24.8 (*)    RDW 19.5 (*)    Lymphs Abs 0.5 (*)    All other components within normal limits  URINALYSIS, ROUTINE W REFLEX MICROSCOPIC - Abnormal; Notable for the following components:   APPearance HAZY (*)    Hgb urine dipstick SMALL (*)    Protein, ur 30 (*)     Leukocytes,Ua MODERATE (*)    WBC, UA >50 (*)    Bacteria, UA RARE (*)    All other components within normal limits  CULTURE, BLOOD (ROUTINE X 2)  CULTURE, BLOOD (ROUTINE X 2)  URINE CULTURE  LACTIC ACID, PLASMA  PROTIME-INR  APTT  LACTIC ACID, PLASMA    EKG EKG Interpretation  Date/Time:  Thursday November 10 2019 21:13:37 EDT Ventricular Rate:  93 PR Interval:    QRS Duration: 104 QT Interval:  330 QTC Calculation: 411 R Axis:   91 Text Interpretation: Sinus rhythm Right axis deviation Confirmed by Lennice Sites [phone]) on 11/10/2019 9:26:54 PM   Radiology DG Chest 2 View  Result Date: 11/09/2019 CLINICAL DATA:  Back pain, metastatic bladder cancer EXAM: CHEST - 2 VIEW COMPARISON:  07/28/2019 FINDINGS: Lungs are clear. No pneumothorax or pleural effusion. Cardiac size within normal limits. Pathologic fractures of T3 and T9 are again identified, better appreciated on prior CT examination. There is increasing paraspinal soft tissue thickening in the region of T9 suggesting a progressive soft tissue mass or hematoma in this region. Pulmonary vascularity is normal. Right internal  jugular chest port tip noted within the right atrium. Left chest implanted neurostimulator seen with its lead overlying the left first costovertebral junction. IMPRESSION: Progressive left paraspinal soft tissue thickening in the region of known pathologic fracture suggesting a progressive hematoma or malignant soft tissue mass in this location. This could be better assessed with CT or MRI examination. Electronically Signed   By: Fidela Salisbury MD   On: 11/09/2019 21:02   DG Thoracic Spine 2 View  Result Date: 11/09/2019 CLINICAL DATA:  Metastatic bladder cancer, back pain EXAM: THORACIC SPINE 2 VIEWS COMPARISON:  CT 10/12/2019 FINDINGS: Pathologic compression fractures of T3 and T9 are again identified with resultant focal kyphosis at T9 secondary to approximately 60-70% loss of height and wedge deformity of the  T9 vertebral body. This appears stable since prior CT examination. There is paravertebral soft tissue thickening, however, at the T9 vertebral body which appears more prominent than noted on prior CT examination and a associated soft tissue mass is difficult to exclude. No new fracture identified. No listhesis identified. Right internal jugular chest port tip noted within the right atrium. IMPRESSION: Pathologic fractures of T3 and T9 are identified, however, there appears to be progressive paraspinal soft tissue thickening at T9 raising the question of a a progressive soft tissue mass in this region. This could be better assessed with CT or MRI examination. Electronically Signed   By: Fidela Salisbury MD   On: 11/09/2019 21:00   DG Lumbar Spine Complete  Result Date: 11/09/2019 CLINICAL DATA:  Back pain bladder cancer EXAM: LUMBAR SPINE - COMPLETE 4+ VIEW COMPARISON:  None. FINDINGS: Five non rib bearing segments of the lumbar spine. Normal lumbar lordosis. Vertebral body height has been preserved. No fracture or listhesis of the lumbar spine. There is mild intervertebral disc space narrowing and endplate remodeling at I3-4 and L3-4 in keeping with changes of mild degenerative disc disease. Oblique views demonstrate no evidence of pars defect. The paraspinal soft tissues are unremarkable. IMPRESSION: Negative. Electronically Signed   By: Fidela Salisbury MD   On: 11/09/2019 20:53   DG Chest Port 1 View  Result Date: 11/10/2019 CLINICAL DATA:  Fever EXAM: PORTABLE CHEST 1 VIEW COMPARISON:  11/09/2019 FINDINGS: Lungs are clear. No pneumothorax or pleural effusion. Neuro stimulator with battery pack overlying the left hemithorax is again noted with its electrode overlying the left first costovertebral junction. Right internal jugular dual-chamber chest port seen with its tip within the right atrium. Small to moderate hiatal hernia again noted. Cardiac size within normal limits. Pulmonary vascularity normal. Focal  widening of the left paravertebral stripe again noted. IMPRESSION: No radiographic evidence of acute cardiopulmonary disease. Focal widening of the left paravertebral stripe again noted. This could be better assessed with CT or MRI examination if indicated. Electronically Signed   By: Fidela Salisbury MD   On: 11/10/2019 21:14    Procedures Procedures (including critical care time)  Medications Ordered in ED Medications  acetaminophen (TYLENOL) tablet 650 mg (650 mg Oral Given 11/10/19 2132)  cefTRIAXone (ROCEPHIN) 2 g in sodium chloride 0.9 % 100 mL IVPB (0 g Intravenous Stopped 11/10/19 2305)    ED Course  I have reviewed the triage vital signs and the nursing notes.  Pertinent labs & imaging results that were available during my care of the patient were reviewed by me and considered in my medical decision making (see chart for details).    MDM Rules/Calculators/A&P  John Holt is a 74 year old male with history of bladder cancer status post surgery and urostomy with mets to bone and liver currently on chemotherapy presents the ED with fever.  Patient with fever but otherwise normal blood pressure, no tachycardia.  Was seen yesterday for the same.  Was discharged after being diagnosed with a urinary tract infection and started on Keflex.  Got IV antibiotics while here.  Was not neutropenic.  Last chemotherapy was last week.  Due for chemotherapy tomorrow.  Continue to have fever today and was sent back for evaluation by oncology.  Patient does have a port.  Denies any abdominal pain, nausea, vomiting.  He does state that he was unable to get his antibiotics filled until this afternoon after there was an error filling his medication.  His last dose of Keflex was at 4 PM.  Will obtain sepsis labs given his history however of Holt patient does have negative blood cultures from yesterday thus far.  However urine culture was not sent.  Overall no neutropenia.  Lactic acid  normal.  Urinalysis still consistent with infection.  Vital signs are normal.  Fever improved.  No significant anemia, electrolyte abnormality, kidney injury.  Talked with oncology on-call and recommend that they follow-up tomorrow outpatient.  Continue Keflex.  Given return precautions.  Blood cultures thus far have been negative.  Hemodynamically he appears stable.  Low concern for systemic infection at this time.  Oncology to arrange a visit tomorrow for reevaluation.  No concern for sepsis at this time.  This chart was dictated using voice recognition software.  Despite best efforts to proofread,  errors can occur which can change the documentation meaning.   Final Clinical Impression(s) / ED Diagnoses Final diagnoses:  Lower urinary tract infectious disease    Rx / DC Orders ED Discharge Orders    None       Lennice Sites, DO 11/11/19 0015

## 2019-11-10 NOTE — Telephone Encounter (Signed)
Called patient's wife and made her aware of Dr. Hazeline Junker instructions. She verbalized understanding.

## 2019-11-10 NOTE — Progress Notes (Signed)
A consult was received from an ED physician for vancomycin and cefepime per pharmacy dosing.  The patient's profile has been reviewed for ht/wt/allergies/indication/available labs.   A one time order has been placed for vancomycin 1g and cefepime 2g.  Further antibiotics/pharmacy consults should be ordered by admitting physician if indicated.                       Thank you, Peggyann Juba, PharmD, BCPS Pharmacy: [phone] 11/10/2019  8:54 PM

## 2019-11-11 ENCOUNTER — Inpatient Hospital Stay: Payer: Medicare PPO

## 2019-11-11 ENCOUNTER — Other Ambulatory Visit: Payer: Self-pay

## 2019-11-11 ENCOUNTER — Inpatient Hospital Stay (HOSPITAL_BASED_OUTPATIENT_CLINIC_OR_DEPARTMENT_OTHER): Payer: Medicare PPO | Admitting: Oncology

## 2019-11-11 VITALS — BP 102/67 | HR 76 | Temp 98.2°F | Resp 18 | Ht 68.0 in | Wt 127.7 lb

## 2019-11-11 DIAGNOSIS — Z5111 Encounter for antineoplastic chemotherapy: Secondary | ICD-10-CM | POA: Diagnosis not present

## 2019-11-11 DIAGNOSIS — Z95828 Presence of other vascular implants and grafts: Secondary | ICD-10-CM

## 2019-11-11 DIAGNOSIS — D649 Anemia, unspecified: Secondary | ICD-10-CM

## 2019-11-11 DIAGNOSIS — C674 Malignant neoplasm of posterior wall of bladder: Secondary | ICD-10-CM

## 2019-11-11 DIAGNOSIS — C679 Malignant neoplasm of bladder, unspecified: Secondary | ICD-10-CM | POA: Diagnosis not present

## 2019-11-11 DIAGNOSIS — N390 Urinary tract infection, site not specified: Secondary | ICD-10-CM | POA: Diagnosis not present

## 2019-11-11 DIAGNOSIS — D6481 Anemia due to antineoplastic chemotherapy: Secondary | ICD-10-CM | POA: Diagnosis not present

## 2019-11-11 DIAGNOSIS — Z452 Encounter for adjustment and management of vascular access device: Secondary | ICD-10-CM | POA: Diagnosis not present

## 2019-11-11 LAB — CBC WITH DIFFERENTIAL (CANCER CENTER ONLY)
Abs Immature Granulocytes: 0.05 10*3/uL (ref 0.00–0.07)
Basophils Absolute: 0 10*3/uL (ref 0.0–0.1)
Basophils Relative: 1 %
Eosinophils Absolute: 0 10*3/uL (ref 0.0–0.5)
Eosinophils Relative: 1 %
HCT: 27 % — ABNORMAL LOW (ref 39.0–52.0)
Hemoglobin: 8.6 g/dL — ABNORMAL LOW (ref 13.0–17.0)
Immature Granulocytes: 1 %
Lymphocytes Relative: 11 %
Lymphs Abs: 0.4 10*3/uL — ABNORMAL LOW (ref 0.7–4.0)
MCH: 28.6 pg (ref 26.0–34.0)
MCHC: 31.9 g/dL (ref 30.0–36.0)
MCV: 89.7 fL (ref 80.0–100.0)
Monocytes Absolute: 0.7 10*3/uL (ref 0.1–1.0)
Monocytes Relative: 17 %
Neutro Abs: 2.8 10*3/uL (ref 1.7–7.7)
Neutrophils Relative %: 69 %
Platelet Count: 215 10*3/uL (ref 150–400)
RBC: 3.01 MIL/uL — ABNORMAL LOW (ref 4.22–5.81)
RDW: 19.6 % — ABNORMAL HIGH (ref 11.5–15.5)
WBC Count: 4 10*3/uL (ref 4.0–10.5)
nRBC: 0 % (ref 0.0–0.2)

## 2019-11-11 LAB — CMP (CANCER CENTER ONLY)
ALT: 28 U/L (ref 0–44)
AST: 35 U/L (ref 15–41)
Albumin: 3.1 g/dL — ABNORMAL LOW (ref 3.5–5.0)
Alkaline Phosphatase: 116 U/L (ref 38–126)
Anion gap: 8 (ref 5–15)
BUN: 19 mg/dL (ref 8–23)
CO2: 22 mmol/L (ref 22–32)
Calcium: 9.1 mg/dL (ref 8.9–10.3)
Chloride: 107 mmol/L (ref 98–111)
Creatinine: 0.87 mg/dL (ref 0.61–1.24)
GFR, Est AFR Am: >60 mL/min (ref >60)
GFR, Estimated: >60 mL/min (ref >60)
Glucose, Bld: 92 mg/dL (ref 70–99)
Potassium: 3.9 mmol/L (ref 3.5–5.1)
Sodium: 137 mmol/L (ref 135–145)
Total Bilirubin: 0.2 mg/dL — ABNORMAL LOW (ref 0.3–1.2)
Total Protein: 6.1 g/dL — ABNORMAL LOW (ref 6.5–8.1)

## 2019-11-11 LAB — SAMPLE TO BLOOD BANK

## 2019-11-11 MED ORDER — HEPARIN SOD (PORK) LOCK FLUSH 100 UNIT/ML IV SOLN
500.0000 [IU] | Freq: Once | INTRAVENOUS | Status: AC
  Administered 2019-11-11: 500 [IU]
  Filled 2019-11-11: qty 5

## 2019-11-11 MED ORDER — SODIUM CHLORIDE 0.9% FLUSH
10.0000 mL | Freq: Once | INTRAVENOUS | Status: AC
  Administered 2019-11-11: 10 mL
  Filled 2019-11-11: qty 10

## 2019-11-11 NOTE — Progress Notes (Signed)
Hematology and Oncology Follow Up   JEREMEY BASCOM [id number] 1945-10-17 74 y.o. 11/11/2019 10:11 AM Avva, Steva Ready, MDAvva, Ravisankar, MD      Principle Diagnosis: 74 year old man with bladder cancer diagnosed in 2018. He was found to have stage IV disease in April 2021.   Prior Therapy:  He is S/P  neoadjuvant chemotherapy in preparation for radical cystectomy utilizing MVAC for 4 cycles under the care of Dr. Marcello Moores at [facility].  He subsequently declined cystectomy for personal reasons.    He remained disease free till January 2020 where he has recurrence of non-muscle invasive disease.  He was started on Pembrolizumab in March 2020 after declining cystectomy.    Repeat TURBT in April 2020 showed persistent high-grade noninvasive papillary urothelial carcinoma.  He underwent BCG treatment in May 2020 under the care of Dr. Alyson Ingles.  Repeat TURBT in August 2020 showed muscle invasive disease without any evidence of metastatic disease based on imaging studies in June 2020.  He is status post radical cystectomy completed on February 25, 2019 with T3a N0 disease.   Current therapy: Gemcitabine and carboplatin started on August 12, 2019. He is here for day eight cycle five of therapy.  Interim History: Mr. Monestime returns today for a repeat evaluation. Since the last visit, he was seen in the emergency department on two separate occasions and was diagnosed with urinary tract infection and prescribed oral antibiotics which she has started last night.  Medically, he reports no fevers today but did have occasional chills earlier this morning.  He is overall fatigued but has been feeling slightly better with improved back pain.  Denies any recent nausea, vomiting or abdominal pain.    Medications: Updated on review. Current Outpatient Medications  Medication Sig Dispense Refill  . cephALEXin (KEFLEX) 500 MG capsule Take 1 capsule (500 mg total) by mouth 3 (three)  times daily for 7 days. 21 capsule 0  . cloBAZam (ONFI) 10 MG tablet Take 1 tablet every night (Patient taking differently: Take 10 mg by mouth daily. ) 90 tablet 3  . clonazePAM (KLONOPIN) 0.5 MG tablet Take 1 tablet as needed for seizure cluster. Do not take more than 2-3 a week (Patient taking differently: Take 0.5 mg by mouth daily as needed (for seizure cluster). Do not take more than 2-3 a week) 10 tablet 5  . dexamethasone (DECADRON) 4 MG tablet Take 1 tablet (4 mg total) by mouth 2 (two) times daily with a meal. (Patient not taking: Reported on 11/09/2019) 30 tablet 0  . ibuprofen (ADVIL) 200 MG tablet Take 400 mg by mouth every 6 (six) hours as needed for moderate pain.    [name] levothyroxine (SYNTHROID) 137 MCG tablet Take 137 mcg by mouth every morning.    . Multiple Vitamin (MULTIVITAMIN WITH MINERALS) TABS tablet Take 1 tablet by mouth in the morning and at bedtime.     . ondansetron (ZOFRAN ODT) 4 MG disintegrating tablet 4mg  ODT q4 hours prn nausea/vomit (Patient not taking: Reported on 11/09/2019) 10 tablet 0  . polyethylene glycol (MIRALAX / GLYCOLAX) 17 g packet Take 17 g by mouth daily. (Patient taking differently: Take 17 g by mouth daily as needed for mild constipation or moderate constipation. ) 14 each 0  . prochlorperazine (COMPAZINE) 10 MG tablet Take 1 tablet (10 mg total) by mouth every 6 (six) hours as needed for nausea or vomiting. (Patient not taking: Reported on 09/23/2019) 30 tablet 0  . senna (SENOKOT) 8.6 MG TABS  tablet Take 2 tablets (17.2 mg total) by mouth 2 (two) times daily. (Patient not taking: Reported on 11/09/2019) 120 tablet 0  . zonisamide (ZONEGRAN) 100 MG capsule TAKE 3 CAPSULES EACH NIGHT (Patient taking differently: Take 300 mg by mouth at bedtime. ) 270 capsule 3   No current facility-administered medications for this visit.   Facility-Administered Medications Ordered in Other Visits  Medication Dose Route Frequency Provider Last Rate Last Admin  . 0.9 %   sodium chloride infusion (Manually program via Guardrails IV Fluids)  250 mL Intravenous Once Wyatt Portela, MD      . heparin lock flush 100 unit/mL  500 Units Intracatheter Once PRN Wyatt Portela, MD      . sodium chloride flush (NS) 0.9 % injection 10 mL  10 mL Intracatheter PRN Wyatt Portela, MD         Allergies:  Allergies  Allergen Reactions  . Chlorhexidine Rash    Full body  . Povidone-Iodine Rash     Physical exam:  Blood pressure 102/67, pulse 76, temperature 98.2 F (36.8 C), temperature source Temporal, resp. rate 18, height 5\' 8"  (1.727 m), weight 127 lb 11.2 oz (57.9 kg), SpO2 100 %.     ECOG 1    General appearance: Comfortable appearing without any discomfort Head: Normocephalic without any trauma Oropharynx: Mucous membranes are moist and pink without any thrush or ulcers. Eyes: Pupils are equal and round reactive to light. Lymph nodes: No cervical, supraclavicular, inguinal or axillary lymphadenopathy.   Heart:regular rate and rhythm.  S1 and S2 without leg edema. Lung: Clear without any rhonchi or wheezes.  No dullness to percussion. Abdomin: Soft, nontender, nondistended with good bowel sounds.  No hepatosplenomegaly. Musculoskeletal: No joint deformity or effusion.  Full range of motion noted. Neurological: No deficits noted on motor, sensory and deep tendon reflex exam. Skin: No petechial rash or dryness.  Appeared moist.       Lab Results: Lab Results  Component Value Date   WBC 3.4 (L) 11/10/2019   HGB 8.0 (L) 11/10/2019   HCT 24.8 (L) 11/10/2019   MCV 88.6 11/10/2019   PLT 198 11/10/2019     Chemistry      Component Value Date/Time   NA 134 (L) 11/10/2019 2140   K 4.1 11/10/2019 2140   CL 102 11/10/2019 2140   CO2 22 11/10/2019 2140   BUN 23 11/10/2019 2140   CREATININE 0.73 11/10/2019 2140   CREATININE 0.90 11/04/2019 1218      Component Value Date/Time   CALCIUM 8.2 (L) 11/10/2019 2140   ALKPHOS 93 11/10/2019 2140    AST 32 11/10/2019 2140   AST 26 11/04/2019 1218   ALT 27 11/10/2019 2140   ALT 17 11/04/2019 1218   BILITOT 0.4 11/10/2019 2140   BILITOT 0.3 11/04/2019 1218      Impression and Plan:  74 year old man with:  1.  Bladder cancer diagnosed in 2018 developed stage IV disease in April 2022.  He is currently receiving salvage chemotherapy utilizing carboplatin and gemcitabine and has started cycle 5 of therapy.  Risks and benefits of continuing this treatment were reviewed today.  Potential complications include nausea, vomiting, myelosuppression were discussed.  Given his recent urinary tract infection and ongoing symptoms I have recommended holding day 8 of chemotherapy and resuming cycle 6 and 2 weeks as scheduled.  2.  IV access: Port-A-Cath remains in place and accessed without any issues.  3.  Antiemetics: Compazine is available to him  without any recent nausea or vomiting.   4.  Prognosis and goals of care: His disease is incurable although aggressive measures are warranted given his reasonable performance status.  5.  Urinary tract infection: He was started on oral antibiotics to be responding at this time.  6.  Anemia: Improved after transfusion does not require any additional transfusion at this time.  7.  Follow-up: In 2 weeks for the start of cycle 6 of therapy.    30  minutes were spent on this visit.  The time was dedicated to updating his disease status, discussing treatment options and addressing complications related to therapy and is seen.  Zola Button, MD 11/11/2019 10:11 AM

## 2019-11-11 NOTE — Discharge Instructions (Addendum)
Continue your antibiotic follow-up with oncology tomorrow for reevaluation.

## 2019-11-12 LAB — URINE CULTURE: Culture: <10000 — AB

## 2019-11-14 DIAGNOSIS — C678 Malignant neoplasm of overlapping sites of bladder: Secondary | ICD-10-CM | POA: Diagnosis not present

## 2019-11-14 DIAGNOSIS — N3000 Acute cystitis without hematuria: Secondary | ICD-10-CM | POA: Diagnosis not present

## 2019-11-14 LAB — CULTURE, BLOOD (ROUTINE X 2)
Culture: NO GROWTH
Culture: NO GROWTH
Special Requests: ADEQUATE
Special Requests: ADEQUATE

## 2019-11-15 LAB — CULTURE, BLOOD (ROUTINE X 2)
Culture: NO GROWTH
Culture: NO GROWTH
Special Requests: ADEQUATE

## 2019-11-25 ENCOUNTER — Inpatient Hospital Stay: Payer: Medicare PPO

## 2019-11-25 ENCOUNTER — Inpatient Hospital Stay: Payer: Medicare PPO | Admitting: Oncology

## 2019-11-25 ENCOUNTER — Other Ambulatory Visit: Payer: Self-pay

## 2019-11-25 VITALS — BP 120/71 | HR 70 | Temp 98.1°F | Resp 18 | Ht 68.0 in | Wt 129.2 lb

## 2019-11-25 DIAGNOSIS — N390 Urinary tract infection, site not specified: Secondary | ICD-10-CM | POA: Diagnosis not present

## 2019-11-25 DIAGNOSIS — C674 Malignant neoplasm of posterior wall of bladder: Secondary | ICD-10-CM

## 2019-11-25 DIAGNOSIS — D6481 Anemia due to antineoplastic chemotherapy: Secondary | ICD-10-CM | POA: Diagnosis not present

## 2019-11-25 DIAGNOSIS — D649 Anemia, unspecified: Secondary | ICD-10-CM

## 2019-11-25 DIAGNOSIS — C679 Malignant neoplasm of bladder, unspecified: Secondary | ICD-10-CM | POA: Diagnosis not present

## 2019-11-25 DIAGNOSIS — Z95828 Presence of other vascular implants and grafts: Secondary | ICD-10-CM

## 2019-11-25 DIAGNOSIS — Z5111 Encounter for antineoplastic chemotherapy: Secondary | ICD-10-CM | POA: Diagnosis not present

## 2019-11-25 DIAGNOSIS — Z452 Encounter for adjustment and management of vascular access device: Secondary | ICD-10-CM | POA: Diagnosis not present

## 2019-11-25 LAB — CMP (CANCER CENTER ONLY)
ALT: 15 U/L (ref 0–44)
AST: 31 U/L (ref 15–41)
Albumin: 3.3 g/dL — ABNORMAL LOW (ref 3.5–5.0)
Alkaline Phosphatase: 131 U/L — ABNORMAL HIGH (ref 38–126)
Anion gap: 8 (ref 5–15)
BUN: 23 mg/dL (ref 8–23)
CO2: 22 mmol/L (ref 22–32)
Calcium: 9.6 mg/dL (ref 8.9–10.3)
Chloride: 109 mmol/L (ref 98–111)
Creatinine: 0.9 mg/dL (ref 0.61–1.24)
GFR, Est AFR Am: >60 mL/min (ref >60)
GFR, Estimated: >60 mL/min (ref >60)
Glucose, Bld: 116 mg/dL — ABNORMAL HIGH (ref 70–99)
Potassium: 4.1 mmol/L (ref 3.5–5.1)
Sodium: 139 mmol/L (ref 135–145)
Total Bilirubin: 0.3 mg/dL (ref 0.3–1.2)
Total Protein: 6.1 g/dL — ABNORMAL LOW (ref 6.5–8.1)

## 2019-11-25 LAB — CBC WITH DIFFERENTIAL (CANCER CENTER ONLY)
Abs Immature Granulocytes: 0.01 10*3/uL (ref 0.00–0.07)
Basophils Absolute: 0.1 10*3/uL (ref 0.0–0.1)
Basophils Relative: 1 %
Eosinophils Absolute: 0.2 10*3/uL (ref 0.0–0.5)
Eosinophils Relative: 4 %
HCT: 25.8 % — ABNORMAL LOW (ref 39.0–52.0)
Hemoglobin: 8.2 g/dL — ABNORMAL LOW (ref 13.0–17.0)
Immature Granulocytes: 0 %
Lymphocytes Relative: 15 %
Lymphs Abs: 0.6 10*3/uL — ABNORMAL LOW (ref 0.7–4.0)
MCH: 29.6 pg (ref 26.0–34.0)
MCHC: 31.8 g/dL (ref 30.0–36.0)
MCV: 93.1 fL (ref 80.0–100.0)
Monocytes Absolute: 0.8 10*3/uL (ref 0.1–1.0)
Monocytes Relative: 19 %
Neutro Abs: 2.5 10*3/uL (ref 1.7–7.7)
Neutrophils Relative %: 61 %
Platelet Count: 276 10*3/uL (ref 150–400)
RBC: 2.77 MIL/uL — ABNORMAL LOW (ref 4.22–5.81)
RDW: 21.7 % — ABNORMAL HIGH (ref 11.5–15.5)
WBC Count: 4.1 10*3/uL (ref 4.0–10.5)
nRBC: 0 % (ref 0.0–0.2)

## 2019-11-25 LAB — SAMPLE TO BLOOD BANK

## 2019-11-25 MED ORDER — SODIUM CHLORIDE 0.9 % IV SOLN
150.0000 mg | Freq: Once | INTRAVENOUS | Status: AC
  Administered 2019-11-25: 150 mg via INTRAVENOUS
  Filled 2019-11-25: qty 150

## 2019-11-25 MED ORDER — SODIUM CHLORIDE 0.9 % IV SOLN
10.0000 mg | Freq: Once | INTRAVENOUS | Status: AC
  Administered 2019-11-25: 10 mg via INTRAVENOUS
  Filled 2019-11-25: qty 10

## 2019-11-25 MED ORDER — PALONOSETRON HCL INJECTION 0.25 MG/5ML
INTRAVENOUS | Status: AC
  Filled 2019-11-25: qty 5

## 2019-11-25 MED ORDER — SODIUM CHLORIDE 0.9 % IV SOLN
800.0000 mg/m2 | Freq: Once | INTRAVENOUS | Status: AC
  Administered 2019-11-25: 1292 mg via INTRAVENOUS
  Filled 2019-11-25: qty 33.98

## 2019-11-25 MED ORDER — PALONOSETRON HCL INJECTION 0.25 MG/5ML
0.2500 mg | Freq: Once | INTRAVENOUS | Status: AC
  Administered 2019-11-25: 0.25 mg via INTRAVENOUS

## 2019-11-25 MED ORDER — SODIUM CHLORIDE 0.9 % IV SOLN
367.0000 mg | Freq: Once | INTRAVENOUS | Status: AC
  Administered 2019-11-25: 370 mg via INTRAVENOUS
  Filled 2019-11-25: qty 37

## 2019-11-25 MED ORDER — SODIUM CHLORIDE 0.9 % IV SOLN
Freq: Once | INTRAVENOUS | Status: AC
  Filled 2019-11-25: qty 250

## 2019-11-25 MED ORDER — SODIUM CHLORIDE 0.9% FLUSH
10.0000 mL | Freq: Once | INTRAVENOUS | Status: AC
  Administered 2019-11-25: 10 mL
  Filled 2019-11-25: qty 10

## 2019-11-25 MED ORDER — HEPARIN SOD (PORK) LOCK FLUSH 100 UNIT/ML IV SOLN
500.0000 [IU] | Freq: Once | INTRAVENOUS | Status: AC | PRN
  Administered 2019-11-25: 500 [IU]
  Filled 2019-11-25: qty 5

## 2019-11-25 MED ORDER — SODIUM CHLORIDE 0.9% FLUSH
10.0000 mL | INTRAVENOUS | Status: DC | PRN
  Administered 2019-11-25: 10 mL
  Filled 2019-11-25: qty 10

## 2019-11-25 NOTE — Progress Notes (Signed)
Hematology and Oncology Follow Up   John Holt [id number] 02/06/1946 74 y.o. 11/25/2019 11:19 AM John Holt, John Millin, MD      Principle Diagnosis: 74 year old man with stage IV bladder cancer with bone and hepatic involvement diagnosed in April 2021. He was initially diagnosed with localized disease in 2018.    Prior Therapy:  He is S/P  neoadjuvant chemotherapy in preparation for radical cystectomy utilizing MVAC for 4 cycles under the care of John Holt at [facility].  He subsequently declined cystectomy for personal reasons.    He remained disease free till January 2020 where he has recurrence of non-muscle invasive disease.  He was started on Pembrolizumab in March 2020 after declining cystectomy.    Repeat TURBT in April 2020 showed persistent high-grade noninvasive papillary urothelial carcinoma.  He underwent BCG treatment in May 2020 under the care of John Holt.  Repeat TURBT in August 2020 showed muscle invasive disease without any evidence of metastatic disease based on imaging studies in June 2020.  He is status post radical cystectomy completed on February 25, 2019 with T3a N0 disease.   Current therapy: Gemcitabine and carboplatin started on August 12, 2019. He is here for day 1 of cycle 6 of therapy.  Interim History: John Holt presents today for a follow-up visit. Since the last visit, he reports feeling well without any major complaints at this time.  He denies any nausea, vomiting or abdominal pain.  He denies any flank pain or fevers.  He has tolerated the last cycle of chemotherapy well although he did have one episode of constipation.  With holding day 8 for the last cycle of chemotherapy helped him recover reasonably well at this time.    Medications: Unchanged on review. Current Outpatient Medications  Medication Sig Dispense Refill   cloBAZam (ONFI) 10 MG tablet Take 1 tablet every night (Patient taking differently:  Take 10 mg by mouth daily. ) 90 tablet 3   clonazePAM (KLONOPIN) 0.5 MG tablet Take 1 tablet as needed for seizure cluster. Do not take more than 2-3 a week (Patient taking differently: Take 0.5 mg by mouth daily as needed (for seizure cluster). Do not take more than 2-3 a week) 10 tablet 5   dexamethasone (DECADRON) 4 MG tablet Take 1 tablet (4 mg total) by mouth 2 (two) times daily with a meal. (Patient not taking: Reported on 11/09/2019) 30 tablet 0   ibuprofen (ADVIL) 200 MG tablet Take 400 mg by mouth every 6 (six) hours as needed for moderate pain.     levothyroxine (SYNTHROID) 137 MCG tablet Take 137 mcg by mouth every morning.     Multiple Vitamin (MULTIVITAMIN WITH MINERALS) TABS tablet Take 1 tablet by mouth in the morning and at bedtime.      ondansetron (ZOFRAN ODT) 4 MG disintegrating tablet 4mg  ODT q4 hours prn nausea/vomit (Patient not taking: Reported on 11/09/2019) 10 tablet 0   polyethylene glycol (MIRALAX / GLYCOLAX) 17 g packet Take 17 g by mouth daily. (Patient taking differently: Take 17 g by mouth daily as needed for mild constipation or moderate constipation. ) 14 each 0   prochlorperazine (COMPAZINE) 10 MG tablet Take 1 tablet (10 mg total) by mouth every 6 (six) hours as needed for nausea or vomiting. (Patient not taking: Reported on 09/23/2019) 30 tablet 0   senna (SENOKOT) 8.6 MG TABS tablet Take 2 tablets (17.2 mg total) by mouth 2 (two) times daily. (Patient not taking: Reported on 11/09/2019)  120 tablet 0   zonisamide (ZONEGRAN) 100 MG capsule TAKE 3 CAPSULES EACH NIGHT (Patient taking differently: Take 300 mg by mouth at bedtime. ) 270 capsule 3   No current facility-administered medications for this visit.   Facility-Administered Medications Ordered in Other Visits  Medication Dose Route Frequency Provider Last Rate Last Admin   0.9 %  sodium chloride infusion (Manually program via Guardrails IV Fluids)  250 mL Intravenous Once Wyatt Portela, MD        heparin lock flush 100 unit/mL  500 Units Intracatheter Once PRN Wyatt Portela, MD       sodium chloride flush (NS) 0.9 % injection 10 mL  10 mL Intracatheter PRN Wyatt Portela, MD         Allergies:  Allergies  Allergen Reactions   Chlorhexidine Rash    Full body   Povidone-Iodine Rash     Physical exam:   Blood pressure 120/71, pulse 70, temperature 98.1 F (36.7 C), temperature source Temporal, resp. rate 18, height 5\' 8"  (1.727 m), weight 129 lb 3.2 oz (58.6 kg), SpO2 99 %.    ECOG 1     General appearance: Alert, awake without any distress. Head: Atraumatic without abnormalities Oropharynx: Without any thrush or ulcers. Eyes: No scleral icterus. Lymph nodes: No lymphadenopathy noted in the cervical, supraclavicular, or axillary nodes Heart:regular rate and rhythm, without any murmurs or gallops.   Lung: Clear to auscultation without any rhonchi, wheezes or dullness to percussion. Abdomin: Soft, nontender without any shifting dullness or ascites. Musculoskeletal: No clubbing or cyanosis. Neurological: No motor or sensory deficits. Skin: No rashes or lesions.       Lab Results: Lab Results  Component Value Date   WBC 4.0 11/11/2019   HGB 8.6 (L) 11/11/2019   HCT 27.0 (L) 11/11/2019   MCV 89.7 11/11/2019   PLT 215 11/11/2019     Chemistry      Component Value Date/Time   NA 137 11/11/2019 1005   K 3.9 11/11/2019 1005   CL 107 11/11/2019 1005   CO2 22 11/11/2019 1005   BUN 19 11/11/2019 1005   CREATININE 0.87 11/11/2019 1005      Component Value Date/Time   CALCIUM 9.1 11/11/2019 1005   ALKPHOS 116 11/11/2019 1005   AST 35 11/11/2019 1005   ALT 28 11/11/2019 1005   BILITOT 0.2 (L) 11/11/2019 1005      Impression and Plan:  74 year old man with:  1. Stage IV bladder cancer with hepatic and bone disease diagnosed in April 2021. He was initially found to have localized disease in 2018.   He has completed 5 cycles of chemotherapy for  palliative purposes without any major complications. Day 8 of cycle 5 was withheld because of increased fatigue and potential urinary tract infection. Risks and benefits of continuing with chemotherapy were reviewed today. Potential complications include that nausea vomiting and myelosuppression were reiterated. Plan is to repeat imaging studies after the conclusion of cycle.   He is agreeable to proceed with this plan.  2.  IV access: Port-A-Cath continues to be in place and in use without any issues.  3.  Antiemetics: No nausea or vomiting reported at this time. Antiemetics available to him.  4.  Prognosis and goals of care: Therapy remains palliative though aggressive measures are warranted given his reasonable performance status.  5.  Urinary tract infection: Resolved without any residual fevers or dysuria.  6.  Anemia: Related to chemotherapy and malignancy and has received  transfusion previously.  7.  Follow-up: In 1 week to complete cycle 6 of therapy.    30  minutes were dedicated to this encounter. The time was spent on reviewing treatment options, complications of therapy and future plan of care reviewed.   Zola Button, MD 11/25/2019 11:19 AM

## 2019-11-25 NOTE — Patient Instructions (Signed)
[facility] Discharge Instructions for Patients Receiving Chemotherapy  Today you received the following chemotherapy agents: Gemzar/Carboplatin.  To help prevent nausea and vomiting after your treatment, we encourage you to take your nausea medication as directed.   If you develop nausea and vomiting that is not controlled by your nausea medication, call the clinic.   BELOW ARE SYMPTOMS THAT SHOULD BE REPORTED IMMEDIATELY:  *FEVER GREATER THAN 100.5 F  *CHILLS WITH OR WITHOUT FEVER  NAUSEA AND VOMITING THAT IS NOT CONTROLLED WITH YOUR NAUSEA MEDICATION  *UNUSUAL SHORTNESS OF BREATH  *UNUSUAL BRUISING OR BLEEDING  TENDERNESS IN MOUTH AND THROAT WITH OR WITHOUT PRESENCE OF ULCERS  *URINARY PROBLEMS  *BOWEL PROBLEMS  UNUSUAL RASH Items with * indicate a potential emergency and should be followed up as soon as possible.  Feel free to call the clinic should you have any questions or concerns. The clinic phone number is [phone].  Please show the CHEMO ALERT CARD at check-in to the Emergency Department and triage nurse.   

## 2019-11-28 ENCOUNTER — Inpatient Hospital Stay: Payer: Medicare PPO

## 2019-11-28 ENCOUNTER — Other Ambulatory Visit: Payer: Self-pay

## 2019-11-28 ENCOUNTER — Inpatient Hospital Stay: Payer: Medicare PPO | Attending: Oncology | Admitting: Medical

## 2019-11-28 VITALS — BP 112/57 | HR 72 | Temp 97.9°F | Resp 17 | Ht 68.0 in | Wt 127.6 lb

## 2019-11-28 DIAGNOSIS — Z87891 Personal history of nicotine dependence: Secondary | ICD-10-CM | POA: Diagnosis not present

## 2019-11-28 DIAGNOSIS — Z79899 Other long term (current) drug therapy: Secondary | ICD-10-CM | POA: Insufficient documentation

## 2019-11-28 DIAGNOSIS — C7800 Secondary malignant neoplasm of unspecified lung: Secondary | ICD-10-CM | POA: Insufficient documentation

## 2019-11-28 DIAGNOSIS — M545 Low back pain: Secondary | ICD-10-CM | POA: Insufficient documentation

## 2019-11-28 DIAGNOSIS — I700 Atherosclerosis of aorta: Secondary | ICD-10-CM | POA: Insufficient documentation

## 2019-11-28 DIAGNOSIS — D6481 Anemia due to antineoplastic chemotherapy: Secondary | ICD-10-CM

## 2019-11-28 DIAGNOSIS — C787 Secondary malignant neoplasm of liver and intrahepatic bile duct: Secondary | ICD-10-CM | POA: Diagnosis not present

## 2019-11-28 DIAGNOSIS — Z8249 Family history of ischemic heart disease and other diseases of the circulatory system: Secondary | ICD-10-CM | POA: Diagnosis not present

## 2019-11-28 DIAGNOSIS — R6883 Chills (without fever): Secondary | ICD-10-CM | POA: Diagnosis not present

## 2019-11-28 DIAGNOSIS — D649 Anemia, unspecified: Secondary | ICD-10-CM

## 2019-11-28 DIAGNOSIS — K449 Diaphragmatic hernia without obstruction or gangrene: Secondary | ICD-10-CM | POA: Insufficient documentation

## 2019-11-28 DIAGNOSIS — Z7289 Other problems related to lifestyle: Secondary | ICD-10-CM | POA: Diagnosis not present

## 2019-11-28 DIAGNOSIS — Z800 Family history of malignant neoplasm of digestive organs: Secondary | ICD-10-CM | POA: Insufficient documentation

## 2019-11-28 DIAGNOSIS — R319 Hematuria, unspecified: Secondary | ICD-10-CM | POA: Diagnosis not present

## 2019-11-28 DIAGNOSIS — N390 Urinary tract infection, site not specified: Secondary | ICD-10-CM

## 2019-11-28 DIAGNOSIS — Z95828 Presence of other vascular implants and grafts: Secondary | ICD-10-CM | POA: Diagnosis not present

## 2019-11-28 DIAGNOSIS — N1330 Unspecified hydronephrosis: Secondary | ICD-10-CM | POA: Insufficient documentation

## 2019-11-28 DIAGNOSIS — R590 Localized enlarged lymph nodes: Secondary | ICD-10-CM | POA: Insufficient documentation

## 2019-11-28 DIAGNOSIS — Z8042 Family history of malignant neoplasm of prostate: Secondary | ICD-10-CM | POA: Diagnosis not present

## 2019-11-28 DIAGNOSIS — Z820 Family history of epilepsy and other diseases of the nervous system: Secondary | ICD-10-CM | POA: Insufficient documentation

## 2019-11-28 DIAGNOSIS — M25551 Pain in right hip: Secondary | ICD-10-CM | POA: Insufficient documentation

## 2019-11-28 DIAGNOSIS — Z9221 Personal history of antineoplastic chemotherapy: Secondary | ICD-10-CM | POA: Diagnosis not present

## 2019-11-28 DIAGNOSIS — G620 Drug-induced polyneuropathy: Secondary | ICD-10-CM | POA: Insufficient documentation

## 2019-11-28 DIAGNOSIS — C674 Malignant neoplasm of posterior wall of bladder: Secondary | ICD-10-CM | POA: Diagnosis not present

## 2019-11-28 DIAGNOSIS — Z888 Allergy status to other drugs, medicaments and biological substances status: Secondary | ICD-10-CM | POA: Diagnosis not present

## 2019-11-28 DIAGNOSIS — T451X5A Adverse effect of antineoplastic and immunosuppressive drugs, initial encounter: Secondary | ICD-10-CM | POA: Diagnosis not present

## 2019-11-28 LAB — CBC WITH DIFFERENTIAL (CANCER CENTER ONLY)
Abs Immature Granulocytes: 0.01 10*3/uL (ref 0.00–0.07)
Basophils Absolute: 0 10*3/uL (ref 0.0–0.1)
Basophils Relative: 1 %
Eosinophils Absolute: 0.1 10*3/uL (ref 0.0–0.5)
Eosinophils Relative: 2 %
HCT: 25.7 % — ABNORMAL LOW (ref 39.0–52.0)
Hemoglobin: 7.9 g/dL — ABNORMAL LOW (ref 13.0–17.0)
Immature Granulocytes: 0 %
Lymphocytes Relative: 8 %
Lymphs Abs: 0.4 10*3/uL — ABNORMAL LOW (ref 0.7–4.0)
MCH: 28 pg (ref 26.0–34.0)
MCHC: 30.7 g/dL (ref 30.0–36.0)
MCV: 91.1 fL (ref 80.0–100.0)
Monocytes Absolute: 0.3 10*3/uL (ref 0.1–1.0)
Monocytes Relative: 6 %
Neutro Abs: 3.6 10*3/uL (ref 1.7–7.7)
Neutrophils Relative %: 83 %
Platelet Count: 233 10*3/uL (ref 150–400)
RBC: 2.82 MIL/uL — ABNORMAL LOW (ref 4.22–5.81)
RDW: 20.8 % — ABNORMAL HIGH (ref 11.5–15.5)
WBC Count: 4.3 10*3/uL (ref 4.0–10.5)
nRBC: 0 % (ref 0.0–0.2)

## 2019-11-28 LAB — CMP (CANCER CENTER ONLY)
ALT: 37 U/L (ref 0–44)
AST: 62 U/L — ABNORMAL HIGH (ref 15–41)
Albumin: 3.1 g/dL — ABNORMAL LOW (ref 3.5–5.0)
Alkaline Phosphatase: 115 U/L (ref 38–126)
Anion gap: 7 (ref 5–15)
BUN: 21 mg/dL (ref 8–23)
CO2: 23 mmol/L (ref 22–32)
Calcium: 9.4 mg/dL (ref 8.9–10.3)
Chloride: 109 mmol/L (ref 98–111)
Creatinine: 0.79 mg/dL (ref 0.61–1.24)
GFR, Est AFR Am: >60 mL/min (ref >60)
GFR, Estimated: >60 mL/min (ref >60)
Glucose, Bld: 86 mg/dL (ref 70–99)
Potassium: 4.2 mmol/L (ref 3.5–5.1)
Sodium: 139 mmol/L (ref 135–145)
Total Bilirubin: 0.4 mg/dL (ref 0.3–1.2)
Total Protein: 5.9 g/dL — ABNORMAL LOW (ref 6.5–8.1)

## 2019-11-28 LAB — SAMPLE TO BLOOD BANK

## 2019-11-28 MED ORDER — HEPARIN SOD (PORK) LOCK FLUSH 100 UNIT/ML IV SOLN
500.0000 [IU] | Freq: Once | INTRAVENOUS | Status: AC
  Administered 2019-11-28: 500 [IU]
  Filled 2019-11-28: qty 5

## 2019-11-28 MED ORDER — SODIUM CHLORIDE 0.9% FLUSH
10.0000 mL | Freq: Once | INTRAVENOUS | Status: AC
  Administered 2019-11-28: 10 mL
  Filled 2019-11-28: qty 10

## 2019-11-28 MED ORDER — SULFAMETHOXAZOLE-TRIMETHOPRIM 800-160 MG PO TABS: 1.0000 | ORAL_TABLET | Freq: Two times a day (BID) | ORAL | 0 refills | Status: DC

## 2019-11-28 NOTE — Progress Notes (Signed)
[facility] Progress Note   John Holt [id number] [birth year] 74 y.o.  John Holt is managed by Dr. Zola Holt  Actively treated with chemotherapy/immunotherapy/hormonal therapy: yes  Current therapy: Gemcitabine and carboplatin   Last treated: 11/25/2019 (cycle 6, day 1)  Next scheduled appointment with provider: 12/02/2019  Assessment: Plan:    Malignant neoplasm of posterior wall of urinary bladder ([place]) - Plan: heparin lock flush 100 unit/mL, sodium chloride flush (NS) 0.9 % injection 10 mL, CMP (Cancer Center only)  Urinary tract infection with hematuria, site unspecified - Plan: sulfamethoxazole-trimethoprim (BACTRIM DS) 800-160 MG tablet  Port-A-Cath in place - Plan: heparin lock flush 100 unit/mL, sodium chloride flush (NS) 0.9 % injection 10 mL  Anemia due to antineoplastic chemotherapy - Plan: CBC with Differential ([place] Only), Sample to Blood Bank, Informed Consent Details: Physician/Practitioner Attestation; Transcribe to consent form and obtain patient signature, Type and screen, Care order/instruction, 0.9 %  sodium chloride infusion (Manually program via Guardrails IV Fluids), sodium chloride flush (NS) 0.9 % injection 10 mL, heparin lock flush 100 unit/mL, Prepare RBC (crossmatch), Transfuse RBC, acetaminophen (TYLENOL) tablet 650 mg   Metastatic malignant neoplasm of the urinary bladder: Patient continues to be managed by Dr. Alen Holt and is status post cycle 6, day 1 of gemcitabine and carboplatin which was dosed on 11/25/2019.  They are scheduled to be seen in follow-up on 12/02/2019.  Urinary tract infection: Patient was given a prescription for Bactrim DS p.o. twice daily x7 days.  No urinalysis or urine culture were collected given the fact that the patient has urostomy bag.  Anemia due to neoplastic chemotherapy: His CBC returned today showing a hemoglobin of 7.9 and a hematocrit of 25.7.  The patient is scheduled to be seen next  on 12/02/2019.  He does not want to stay for a transfusion today but was willing to come in early on Friday for a repeat lab for the possibility of a transfusion of 1 unit of packed red blood cells which is been ordered.  Please see After Visit Summary for patient specific instructions.  Future Appointments  Date Time Provider [place]  12/02/2019 11:15 AM [facility] LAB 4 [facility] None  12/02/2019 11:30 AM [facility] [place] FLUSH [facility] None  12/02/2019 12:30 PM [facility] INFUSION [facility] None  12/23/2019 10:45 AM [facility] LAB ONLY [facility] None  12/23/2019 11:00 AM [facility] [place] FLUSH [facility] None  12/26/2019  9:00 AM Holt, John Dad, MD [facility] None  01/06/2020  2:00 PM John Sprang, MD [facility] None    Orders Placed This Encounter  Procedures  . CBC with Differential ([place] Only)  . CMP ([place] only)  . Informed Consent Details: Physician/Practitioner Attestation; Transcribe to consent form and obtain patient signature  . Care order/instruction  . Sample to Blood Bank  . Type and screen       Subjective:   Patient ID:  John Holt is a 74 y.o. (DOB 1946/02/24) male.  Chief Complaint: No chief complaint on file.   HPI John Holt is a 74 y.o. male with a diagnosis of metastatic bladder cancer who is managed by Dr. Alen Holt and is currently treated with gemcitabine and carboplatin with cycle 6, day 1 dosed on 11/25/2019.  [name] presents to the office today with a report that he had been treated for a urinary tract infection his last visit.  He had his last chemotherapy last Friday.  He reports having a low-grade temperature of 99.7 with  chills on Sunday.  He also had lower back pain consistent with his prior history of urinary tract infections.  His labs returned today showing that he could have a transfusion of 1 unit of packed red blood cells given that his hemoglobin is 7.9 and hematocrit is 25.7.  He defers waiting today and would like  to have his labs redone on Friday with consideration of a transfusion at that time if indicated.   Medications: I have reviewed the patient's current medications.  Allergies:  Allergies  Allergen Reactions  . Chlorhexidine Rash    Full body  . Povidone-Iodine Rash    Past Medical History:  Diagnosis Date  . Anxiety disorder   . Bladder cancer [facility]) urologist-- dr John Holt ([facility]) and dr John Holt [facility]):  oncologist-  dr John Holt ([facility]) & dr John Holt [facility])   dx 2018--- s/p  TURBT's ;  hx BCG tx's.  chemotherapy completed 2019, and immunotherapy Keytruda , last one 10-21-2018  . BPH (benign prostatic hypertrophy) with urinary obstruction   . History of adenomatous polyp of colon   . History of closed head injury    2000-- fell off ladder--  temporary memory loss resolved  . History of psychosis    x2  last one documented 2006  w/ hallucination  and suicide ideation  . Nocturia   . Port-A-Cath in place 2018  . S/P placement of VNS (vagus nerve stimulation) device 01/28/2018   per pt swipe magnet twice daily  . Sigmoid diverticulosis   . Sinus bradycardia seen on cardiac monitor    cardiology --  dr John Holt;   event monitor results 11-15-2018 in epic,  NSR w/ ST and SB and a nocturnal pause   . Temporal lobe epilepsy syndrome [facility]) neurologist-  dr John Holt-  avergae one every 2 weeks "legs jerking around, per wife , pt unaware he's doing this"   localization-related right temporal lobe --  mostly nocturnal w/ bilateral lower extremitity movement, staring and moaning then dissorietation afterwards (12-02-2018 per pt last daytime seizure 11-11-2018 and last nighttime seizure 11-29-2018  . Vesicoureteral reflux, bilateral   . Wears glasses     Past Surgical History:  Procedure Laterality Date  . COLONOSCOPY    . COLONOSCOPY W/ POLYPECTOMY  09-11-2009  . CYSTOSCOPY W/ RETROGRADES Bilateral 08/13/2018   Procedure: CYSTOSCOPY WITH RETROGRADE PYELOGRAM;  Surgeon:  Cleon Gustin, MD;  Location: [facility];  Service: Urology;  Laterality: Bilateral;  . CYSTOSCOPY WITH INJECTION N/A 02/25/2019   Procedure: CYSTOSCOPY WITH INJECTION;  Surgeon: Alexis Frock, MD;  Location: [facility];  Service: Urology;  Laterality: N/A;  . GREEN LIGHT LASER TURP (TRANSURETHRAL RESECTION OF PROSTATE N/A 04/07/2014   Procedure: GREEN LIGHT LASER TURP (TRANSURETHRAL RESECTION OF PROSTATE;  Surgeon: Ailene Rud, MD;  Location: [facility];  Service: Urology;  Laterality: N/A;  . INGUINAL HERNIA REPAIR Bilateral 04/15/2013   Procedure: LAPAROSCOPIC BILATERAL INGUINAL HERNIA REPAIR;  Surgeon: Gayland Curry, MD;  Location: [facility];  Service: General;  Laterality: Bilateral;  . INSERTION OF MESH N/A 04/15/2013   Procedure: INSERTION OF MESH;  Surgeon: Gayland Curry, MD;  Location: [facility];  Service: General;  Laterality: N/A;  . PORTACATH PLACEMENT  2018  . TRANSURETHRAL RESECTION OF BLADDER  01-27-2017;  05-26-2017;  05-25-2018  both by dr Chriss Czar davis @[email or web address]   . TRANSURETHRAL RESECTION OF BLADDER TUMOR N/A 08/13/2018   Procedure: TRANSURETHRAL RESECTION OF BLADDER TUMOR (TURBT);  Surgeon: Cleon Gustin,  MD;  Location: [facility];  Service: Urology;  Laterality: N/A;  1 HR  . TRANSURETHRAL RESECTION OF BLADDER TUMOR N/A 12/03/2018   Procedure: TRANSURETHRAL RESECTION OF BLADDER TUMOR (TURBT);  Surgeon: Cleon Gustin, MD;  Location: [facility];  Service: Urology;  Laterality: N/A;  . UMBILICAL HERNIA REPAIR N/A 04/15/2013   Procedure: OPEN HERNIA REPAIR UMBILICAL ADULT;  Surgeon: Gayland Curry, MD;  Location: [facility];  Service: General;  Laterality: N/A;  . VAGUS NERVE STIMULATOR INSERTION Left 01/28/2018   Procedure: VAGAL NERVE STIMULATOR PLACEMENT;  Surgeon: Consuella Lose, MD;  Location: [place];  Service: Neurosurgery;  Laterality: Left;  VAGAL NERVE STIMULATOR PLACEMENT    Family History  Problem Relation Age of Onset  . Seizures Father    . Heart disease Father   . Cancer Father        prostat  . Cancer Mother        stomach  . Stomach cancer Mother   . Colon cancer Neg Hx   . Colon polyps Neg Hx   . Esophageal cancer Neg Hx   . Rectal cancer Neg Hx     Social History   Socioeconomic History  . Marital status: Married    Spouse name: [name]  . Number of children: 2  . Years of education: PhD  . 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 Smoking status: Former Smoker    Packs/day: 1.00    Years: 15.00    Pack years: 15.00    Types: Cigarettes    Quit date: 04/03/1972    Years since quitting: 47.6  . Smokeless tobacco: Never Used  Vaping Use  . Vaping Use: Never used  Substance and Sexual Activity  . Alcohol use: Yes    Alcohol/week: 14.0 standard drinks    Types: 14 Glasses of wine per week    Comment: 1 or 2  wine daily; no consumption in last 24 hours  . Drug use: No  . Sexual activity: Yes    Partners: Female  Other Topics Concern  . Not on file  Social History Narrative   Pt lives at home with his wife. One story home.      Left handed      Phd in English      Caffeine Use- 2 cups daily   Social Determinants of Health   Financial Resource Strain:   . Difficulty of Paying Living Expenses:   Food Insecurity:   . Worried About Charity fundraiser in the Last Year:   . Arboriculturist in the Last Year:   Transportation Needs:   . Film/video editor (Medical):   [name] Lack of Transportation (Non-Medical):   Physical Activity:   . Days of Exercise per Week:   . Minutes of Exercise per Session:   Stress:   . Feeling of Stress :   Social Connections:   . Frequency of Communication with Friends and Family:   . Frequency of Social Gatherings with Friends and Family:   . Attends Religious Services:   . Active Member of Clubs or Organizations:   . Attends Archivist Meetings:     [name] Marital Status:   Intimate Partner Violence:   . Fear of Current or Ex-Partner:   . Emotionally Abused:   [name] Physically Abused:   . Sexually Abused:  Past Medical History, Surgical history, Social history, and Family history were reviewed and updated as appropriate.   Please see review of systems for further details on the patient's review from today.   Review of Systems:  Review of Systems  Constitutional: Positive for chills. Negative for diaphoresis and fever.  HENT: Negative for trouble swallowing and voice change.   Respiratory: Negative for cough, chest tightness, shortness of breath and wheezing.   Cardiovascular: Negative for chest pain and palpitations.  Gastrointestinal: Negative for abdominal pain, constipation, diarrhea, nausea and vomiting.  Musculoskeletal: Positive for back pain. Negative for myalgias.  Neurological: Negative for dizziness, light-headedness and headaches.    Objective:   Physical Exam:  BP (!) 112/57 (BP Location: Left Arm, Patient Position: Sitting)   Pulse 72   Temp 97.9 F (36.6 C) (Oral)   Resp 17   Ht 5\' 8"  (1.727 m)   Wt 127 lb 9.6 oz (57.9 kg)   SpO2 100% Comment: retake  BMI 19.40 kg/m  ECOG: 1  Physical Exam Constitutional:      General: He is not in acute distress.    Appearance: Normal appearance. He is not ill-appearing.  Eyes:     General: No scleral icterus.       Right eye: No discharge.        Left eye: No discharge.     Conjunctiva/sclera: Conjunctivae normal.  Cardiovascular:     Rate and Rhythm: Normal rate and regular rhythm.     Heart sounds: No murmur heard.  No friction rub. No gallop.   Pulmonary:     Effort: Pulmonary effort is normal. No respiratory distress.     Breath sounds: Normal breath sounds. No wheezing, rhonchi or rales.  Abdominal:     General: Bowel sounds are normal.    Skin:    General: Skin is warm and dry.  Neurological:     Mental Status: He is alert.  Psychiatric:         Mood and Affect: Mood normal.        Behavior: Behavior normal.        Thought Content: Thought content normal.        Judgment: Judgment normal.     Lab Review:     Component Value Date/Time   NA 139 11/28/2019 1042   K 4.2 11/28/2019 1042   CL 109 11/28/2019 1042   CO2 23 11/28/2019 1042   GLUCOSE 86 11/28/2019 1042   BUN 21 11/28/2019 1042   CREATININE 0.79 11/28/2019 1042   CALCIUM 9.4 11/28/2019 1042   PROT 5.9 (L) 11/28/2019 1042   ALBUMIN 3.1 (L) 11/28/2019 1042   AST 62 (H) 11/28/2019 1042   ALT 37 11/28/2019 1042   ALKPHOS 115 11/28/2019 1042   BILITOT 0.4 11/28/2019 1042   GFRNONAA >60 11/28/2019 1042   GFRAA >60 11/28/2019 1042       Component Value Date/Time   WBC 4.3 11/28/2019 1042   WBC 3.4 (L) 11/10/2019 2140   RBC 2.82 (L) 11/28/2019 1042   HGB 7.9 (L) 11/28/2019 1042   HCT 25.7 (L) 11/28/2019 1042   PLT 233 11/28/2019 1042   MCV 91.1 11/28/2019 1042   MCH 28.0 11/28/2019 1042   MCHC 30.7 11/28/2019 1042   RDW 20.8 (H) 11/28/2019 1042   LYMPHSABS 0.4 (L) 11/28/2019 1042   MONOABS 0.3 11/28/2019 1042   EOSABS 0.1 11/28/2019 1042   BASOSABS 0.0 11/28/2019 1042   -------------------------------  Imaging from last 24 hours (if applicable):  Radiology interpretation: DG Chest 2 View  Result Date: 11/09/2019 CLINICAL DATA:  Back pain, metastatic bladder cancer EXAM: CHEST - 2 VIEW COMPARISON:  07/28/2019 FINDINGS: Lungs are clear. No pneumothorax or pleural effusion. Cardiac size within normal limits. Pathologic fractures of T3 and T9 are again identified, better appreciated on prior CT examination. There is increasing paraspinal soft tissue thickening in the region of T9 suggesting a progressive soft tissue mass or hematoma in this region. Pulmonary vascularity is normal. Right internal jugular chest port tip noted within the right atrium. Left chest implanted neurostimulator seen with its lead overlying the left first costovertebral junction.  IMPRESSION: Progressive left paraspinal soft tissue thickening in the region of known pathologic fracture suggesting a progressive hematoma or malignant soft tissue mass in this location. This could be better assessed with CT or MRI examination. Electronically Signed   By: Fidela Salisbury MD   On: 11/09/2019 21:02   DG Thoracic Spine 2 View  Result Date: 11/09/2019 CLINICAL DATA:  Metastatic bladder cancer, back pain EXAM: THORACIC SPINE 2 VIEWS COMPARISON:  CT 10/12/2019 FINDINGS: Pathologic compression fractures of T3 and T9 are again identified with resultant focal kyphosis at T9 secondary to approximately 60-70% loss of height and wedge deformity of the T9 vertebral body. This appears stable since prior CT examination. There is paravertebral soft tissue thickening, however, at the T9 vertebral body which appears more prominent than noted on prior CT examination and a associated soft tissue mass is difficult to exclude. No new fracture identified. No listhesis identified. Right internal jugular chest port tip noted within the right atrium. IMPRESSION: Pathologic fractures of T3 and T9 are identified, however, there appears to be progressive paraspinal soft tissue thickening at T9 raising the question of a a progressive soft tissue mass in this region. This could be better assessed with CT or MRI examination. Electronically Signed   By: Fidela Salisbury MD   On: 11/09/2019 21:00   DG Lumbar Spine Complete  Result Date: 11/09/2019 CLINICAL DATA:  Back pain bladder cancer EXAM: LUMBAR SPINE - COMPLETE 4+ VIEW COMPARISON:  None. FINDINGS: Five non rib bearing segments of the lumbar spine. Normal lumbar lordosis. Vertebral body height has been preserved. No fracture or listhesis of the lumbar spine. There is mild intervertebral disc space narrowing and endplate remodeling at N2-7 and L3-4 in keeping with changes of mild degenerative disc disease. Oblique views demonstrate no evidence of pars defect. The  paraspinal soft tissues are unremarkable. IMPRESSION: Negative. Electronically Signed   By: Fidela Salisbury MD   On: 11/09/2019 20:53   DG Chest Port 1 View  Result Date: 11/10/2019 CLINICAL DATA:  Fever EXAM: PORTABLE CHEST 1 VIEW COMPARISON:  11/09/2019 FINDINGS: Lungs are clear. No pneumothorax or pleural effusion. Neuro stimulator with battery pack overlying the left hemithorax is again noted with its electrode overlying the left first costovertebral junction. Right internal jugular dual-chamber chest port seen with its tip within the right atrium. Small to moderate hiatal hernia again noted. Cardiac size within normal limits. Pulmonary vascularity normal. Focal widening of the left paravertebral stripe again noted. IMPRESSION: No radiographic evidence of acute cardiopulmonary disease. Focal widening of the left paravertebral stripe again noted. This could be better assessed with CT or MRI examination if indicated. Electronically Signed   By: Fidela Salisbury MD   On: 11/10/2019 21:14        This case was discussed Dr. Alen Holt. He expressed agreement with my management of this patient.

## 2019-11-28 NOTE — Patient Instructions (Signed)

## 2019-11-28 NOTE — Progress Notes (Signed)
Patient has a double port. Accessed and flushed both sides. Lateral side, accessed, flushed, no blood return noted, heparin  locked and de accessed.  Medial side accessed, flushed, blood return noted, saline locked. Patient to be seen in sys management.

## 2019-12-01 ENCOUNTER — Telehealth: Payer: Self-pay | Admitting: Medical

## 2019-12-01 NOTE — Telephone Encounter (Signed)
Called patient about appointments for tomorrow, spoke with patients wife, she is aware of the appointments and confirmed that he was getting blood tomorrow with chemo.  Georgina Snell

## 2019-12-02 ENCOUNTER — Inpatient Hospital Stay: Payer: Medicare PPO

## 2019-12-02 ENCOUNTER — Other Ambulatory Visit: Payer: Self-pay | Admitting: *Deleted

## 2019-12-02 ENCOUNTER — Other Ambulatory Visit: Payer: Self-pay

## 2019-12-02 DIAGNOSIS — C674 Malignant neoplasm of posterior wall of bladder: Secondary | ICD-10-CM

## 2019-12-02 DIAGNOSIS — Z95828 Presence of other vascular implants and grafts: Secondary | ICD-10-CM

## 2019-12-02 DIAGNOSIS — R590 Localized enlarged lymph nodes: Secondary | ICD-10-CM | POA: Diagnosis not present

## 2019-12-02 DIAGNOSIS — D6481 Anemia due to antineoplastic chemotherapy: Secondary | ICD-10-CM | POA: Diagnosis not present

## 2019-12-02 DIAGNOSIS — C787 Secondary malignant neoplasm of liver and intrahepatic bile duct: Secondary | ICD-10-CM | POA: Diagnosis not present

## 2019-12-02 DIAGNOSIS — G620 Drug-induced polyneuropathy: Secondary | ICD-10-CM | POA: Diagnosis not present

## 2019-12-02 DIAGNOSIS — C7800 Secondary malignant neoplasm of unspecified lung: Secondary | ICD-10-CM | POA: Diagnosis not present

## 2019-12-02 DIAGNOSIS — R319 Hematuria, unspecified: Secondary | ICD-10-CM | POA: Diagnosis not present

## 2019-12-02 DIAGNOSIS — T451X5A Adverse effect of antineoplastic and immunosuppressive drugs, initial encounter: Secondary | ICD-10-CM | POA: Diagnosis not present

## 2019-12-02 DIAGNOSIS — N390 Urinary tract infection, site not specified: Secondary | ICD-10-CM | POA: Diagnosis not present

## 2019-12-02 LAB — CBC WITH DIFFERENTIAL (CANCER CENTER ONLY)
Abs Immature Granulocytes: 0.02 10*3/uL (ref 0.00–0.07)
Basophils Absolute: 0 10*3/uL (ref 0.0–0.1)
Basophils Relative: 2 %
Eosinophils Absolute: 0.1 10*3/uL (ref 0.0–0.5)
Eosinophils Relative: 4 %
HCT: 25.3 % — ABNORMAL LOW (ref 39.0–52.0)
Hemoglobin: 7.9 g/dL — ABNORMAL LOW (ref 13.0–17.0)
Immature Granulocytes: 1 %
Lymphocytes Relative: 32 %
Lymphs Abs: 0.5 10*3/uL — ABNORMAL LOW (ref 0.7–4.0)
MCH: 28.3 pg (ref 26.0–34.0)
MCHC: 31.2 g/dL (ref 30.0–36.0)
MCV: 90.7 fL (ref 80.0–100.0)
Monocytes Absolute: 0.4 10*3/uL (ref 0.1–1.0)
Monocytes Relative: 22 %
Neutro Abs: 0.6 10*3/uL — ABNORMAL LOW (ref 1.7–7.7)
Neutrophils Relative %: 39 %
Platelet Count: 168 10*3/uL (ref 150–400)
RBC: 2.79 MIL/uL — ABNORMAL LOW (ref 4.22–5.81)
RDW: 19.7 % — ABNORMAL HIGH (ref 11.5–15.5)
WBC Count: 1.6 10*3/uL — ABNORMAL LOW (ref 4.0–10.5)
nRBC: 0 % (ref 0.0–0.2)

## 2019-12-02 LAB — CMP (CANCER CENTER ONLY)
ALT: 33 U/L (ref 0–44)
AST: 45 U/L — ABNORMAL HIGH (ref 15–41)
Albumin: 3.3 g/dL — ABNORMAL LOW (ref 3.5–5.0)
Alkaline Phosphatase: 137 U/L — ABNORMAL HIGH (ref 38–126)
Anion gap: 7 (ref 5–15)
BUN: 19 mg/dL (ref 8–23)
CO2: 22 mmol/L (ref 22–32)
Calcium: 9.8 mg/dL (ref 8.9–10.3)
Chloride: 108 mmol/L (ref 98–111)
Creatinine: 1.07 mg/dL (ref 0.61–1.24)
GFR, Est AFR Am: >60 mL/min (ref >60)
GFR, Estimated: >60 mL/min (ref >60)
Glucose, Bld: 82 mg/dL (ref 70–99)
Potassium: 4.4 mmol/L (ref 3.5–5.1)
Sodium: 137 mmol/L (ref 135–145)
Total Bilirubin: <0.2 mg/dL — ABNORMAL LOW (ref 0.3–1.2)
Total Protein: 6.3 g/dL — ABNORMAL LOW (ref 6.5–8.1)

## 2019-12-02 LAB — SAMPLE TO BLOOD BANK

## 2019-12-02 LAB — PREPARE RBC (CROSSMATCH)

## 2019-12-02 MED ORDER — SODIUM CHLORIDE 0.9% FLUSH
10.0000 mL | INTRAVENOUS | Status: AC | PRN
  Administered 2019-12-02: 10 mL
  Filled 2019-12-02: qty 10

## 2019-12-02 MED ORDER — HEPARIN SOD (PORK) LOCK FLUSH 100 UNIT/ML IV SOLN
250.0000 [IU] | INTRAVENOUS | Status: AC | PRN
  Administered 2019-12-02: 250 [IU]
  Filled 2019-12-02: qty 5

## 2019-12-02 MED ORDER — SODIUM CHLORIDE 0.9% IV SOLUTION
250.0000 mL | Freq: Once | INTRAVENOUS | Status: AC
  Administered 2019-12-02: 250 mL via INTRAVENOUS
  Filled 2019-12-02: qty 250

## 2019-12-02 MED ORDER — SODIUM CHLORIDE 0.9% FLUSH
10.0000 mL | Freq: Once | INTRAVENOUS | Status: AC
  Administered 2019-12-02: 10 mL
  Filled 2019-12-02: qty 10

## 2019-12-02 MED ORDER — DIPHENHYDRAMINE HCL 25 MG PO CAPS
ORAL_CAPSULE | ORAL | Status: AC
  Filled 2019-12-02: qty 2

## 2019-12-02 MED ORDER — DIPHENHYDRAMINE HCL 25 MG PO CAPS
25.0000 mg | ORAL_CAPSULE | Freq: Once | ORAL | Status: AC
  Administered 2019-12-02: 25 mg via ORAL

## 2019-12-02 MED ORDER — HEPARIN SOD (PORK) LOCK FLUSH 100 UNIT/ML IV SOLN
500.0000 [IU] | Freq: Once | INTRAVENOUS | Status: AC
  Administered 2019-12-02: 500 [IU]
  Filled 2019-12-02: qty 5

## 2019-12-02 MED ORDER — ACETAMINOPHEN 325 MG PO TABS
650.0000 mg | ORAL_TABLET | Freq: Once | ORAL | Status: AC
  Administered 2019-12-02: 650 mg via ORAL

## 2019-12-02 MED ORDER — ACETAMINOPHEN 325 MG PO TABS
ORAL_TABLET | ORAL | Status: AC
  Filled 2019-12-02: qty 2

## 2019-12-02 NOTE — Progress Notes (Signed)
Per Dr.Shadad, pt will not receive chemo today but 1 unit RBC's with premeds benadryl and tylenol. Infusion nurse notified

## 2019-12-02 NOTE — Patient Instructions (Signed)

## 2019-12-05 LAB — TYPE AND SCREEN
ABO/RH(D): A POS
Antibody Screen: NEGATIVE
Unit division: 0

## 2019-12-05 LAB — BPAM RBC
Blood Product Expiration Date: 202108242359
ISSUE DATE / TIME: 202108061127
Unit Type and Rh: 6200

## 2019-12-23 ENCOUNTER — Inpatient Hospital Stay: Payer: Medicare PPO

## 2019-12-23 ENCOUNTER — Ambulatory Visit (HOSPITAL_COMMUNITY)
Admission: RE | Admit: 2019-12-23 | Discharge: 2019-12-23 | Disposition: A | Discharge status: Home / Self Care | Payer: Medicare PPO | Source: Ambulatory Visit | Attending: Oncology | Admitting: Oncology

## 2019-12-23 ENCOUNTER — Other Ambulatory Visit: Payer: Self-pay

## 2019-12-23 ENCOUNTER — Encounter (HOSPITAL_COMMUNITY): Payer: Self-pay

## 2019-12-23 DIAGNOSIS — C674 Malignant neoplasm of posterior wall of bladder: Secondary | ICD-10-CM | POA: Insufficient documentation

## 2019-12-23 DIAGNOSIS — Z95828 Presence of other vascular implants and grafts: Secondary | ICD-10-CM

## 2019-12-23 DIAGNOSIS — K449 Diaphragmatic hernia without obstruction or gangrene: Secondary | ICD-10-CM | POA: Diagnosis not present

## 2019-12-23 DIAGNOSIS — R319 Hematuria, unspecified: Secondary | ICD-10-CM | POA: Diagnosis not present

## 2019-12-23 DIAGNOSIS — G620 Drug-induced polyneuropathy: Secondary | ICD-10-CM | POA: Diagnosis not present

## 2019-12-23 DIAGNOSIS — Z8551 Personal history of malignant neoplasm of bladder: Secondary | ICD-10-CM | POA: Diagnosis not present

## 2019-12-23 DIAGNOSIS — D649 Anemia, unspecified: Secondary | ICD-10-CM

## 2019-12-23 DIAGNOSIS — C787 Secondary malignant neoplasm of liver and intrahepatic bile duct: Secondary | ICD-10-CM | POA: Diagnosis not present

## 2019-12-23 DIAGNOSIS — C679 Malignant neoplasm of bladder, unspecified: Secondary | ICD-10-CM | POA: Diagnosis not present

## 2019-12-23 DIAGNOSIS — C7800 Secondary malignant neoplasm of unspecified lung: Secondary | ICD-10-CM | POA: Diagnosis not present

## 2019-12-23 DIAGNOSIS — N390 Urinary tract infection, site not specified: Secondary | ICD-10-CM | POA: Diagnosis not present

## 2019-12-23 DIAGNOSIS — T451X5A Adverse effect of antineoplastic and immunosuppressive drugs, initial encounter: Secondary | ICD-10-CM | POA: Diagnosis not present

## 2019-12-23 DIAGNOSIS — D6481 Anemia due to antineoplastic chemotherapy: Secondary | ICD-10-CM | POA: Diagnosis not present

## 2019-12-23 DIAGNOSIS — Z936 Other artificial openings of urinary tract status: Secondary | ICD-10-CM | POA: Diagnosis not present

## 2019-12-23 DIAGNOSIS — R590 Localized enlarged lymph nodes: Secondary | ICD-10-CM | POA: Diagnosis not present

## 2019-12-23 LAB — CMP (CANCER CENTER ONLY)
ALT: 29 U/L (ref 0–44)
AST: 40 U/L (ref 15–41)
Albumin: 3.3 g/dL — ABNORMAL LOW (ref 3.5–5.0)
Alkaline Phosphatase: 192 U/L — ABNORMAL HIGH (ref 38–126)
Anion gap: 8 (ref 5–15)
BUN: 25 mg/dL — ABNORMAL HIGH (ref 8–23)
CO2: 22 mmol/L (ref 22–32)
Calcium: 8.9 mg/dL (ref 8.9–10.3)
Chloride: 109 mmol/L (ref 98–111)
Creatinine: 0.84 mg/dL (ref 0.61–1.24)
GFR, Est AFR Am: >60 mL/min (ref >60)
GFR, Estimated: >60 mL/min (ref >60)
Glucose, Bld: 86 mg/dL (ref 70–99)
Potassium: 4.1 mmol/L (ref 3.5–5.1)
Sodium: 139 mmol/L (ref 135–145)
Total Bilirubin: 0.3 mg/dL (ref 0.3–1.2)
Total Protein: 6.3 g/dL — ABNORMAL LOW (ref 6.5–8.1)

## 2019-12-23 LAB — CBC WITH DIFFERENTIAL (CANCER CENTER ONLY)
Abs Immature Granulocytes: 0.01 10*3/uL (ref 0.00–0.07)
Basophils Absolute: 0 10*3/uL (ref 0.0–0.1)
Basophils Relative: 1 %
Eosinophils Absolute: 1.2 10*3/uL — ABNORMAL HIGH (ref 0.0–0.5)
Eosinophils Relative: 22 %
HCT: 24.9 % — ABNORMAL LOW (ref 39.0–52.0)
Hemoglobin: 7.6 g/dL — ABNORMAL LOW (ref 13.0–17.0)
Immature Granulocytes: 0 %
Lymphocytes Relative: 11 %
Lymphs Abs: 0.6 10*3/uL — ABNORMAL LOW (ref 0.7–4.0)
MCH: 28 pg (ref 26.0–34.0)
MCHC: 30.5 g/dL (ref 30.0–36.0)
MCV: 91.9 fL (ref 80.0–100.0)
Monocytes Absolute: 0.9 10*3/uL (ref 0.1–1.0)
Monocytes Relative: 16 %
Neutro Abs: 2.7 10*3/uL (ref 1.7–7.7)
Neutrophils Relative %: 50 %
Platelet Count: 281 10*3/uL (ref 150–400)
RBC: 2.71 MIL/uL — ABNORMAL LOW (ref 4.22–5.81)
RDW: 17.9 % — ABNORMAL HIGH (ref 11.5–15.5)
WBC Count: 5.5 10*3/uL (ref 4.0–10.5)
nRBC: 0 % (ref 0.0–0.2)

## 2019-12-23 LAB — SAMPLE TO BLOOD BANK

## 2019-12-23 MED ORDER — IOHEXOL 300 MG/ML  SOLN
100.0000 mL | Freq: Once | INTRAMUSCULAR | Status: AC | PRN
  Administered 2019-12-23: 100 mL via INTRAVENOUS

## 2019-12-23 MED ORDER — SODIUM CHLORIDE (PF) 0.9 % IJ SOLN
INTRAMUSCULAR | Status: AC
  Filled 2019-12-23: qty 50

## 2019-12-23 MED ORDER — SODIUM CHLORIDE 0.9 % IV SOLN
INTRAVENOUS | Status: AC
  Filled 2019-12-23: qty 250

## 2019-12-23 MED ORDER — SODIUM CHLORIDE 0.9% FLUSH
10.0000 mL | Freq: Once | INTRAVENOUS | Status: AC
  Administered 2019-12-23: 10 mL
  Filled 2019-12-23: qty 10

## 2019-12-23 MED ORDER — HEPARIN SOD (PORK) LOCK FLUSH 100 UNIT/ML IV SOLN
500.0000 [IU] | Freq: Once | INTRAVENOUS | Status: AC
  Administered 2019-12-23: 500 [IU] via INTRAVENOUS

## 2019-12-23 MED ORDER — HEPARIN SOD (PORK) LOCK FLUSH 100 UNIT/ML IV SOLN
INTRAVENOUS | Status: AC
  Filled 2019-12-23: qty 5

## 2019-12-23 NOTE — Patient Instructions (Signed)

## 2019-12-26 ENCOUNTER — Inpatient Hospital Stay: Payer: Medicare PPO | Admitting: Oncology

## 2019-12-26 ENCOUNTER — Other Ambulatory Visit: Payer: Self-pay

## 2019-12-26 VITALS — BP 110/60 | HR 77 | Temp 96.3°F | Resp 18 | Ht 68.0 in | Wt 130.4 lb

## 2019-12-26 DIAGNOSIS — R319 Hematuria, unspecified: Secondary | ICD-10-CM | POA: Diagnosis not present

## 2019-12-26 DIAGNOSIS — C787 Secondary malignant neoplasm of liver and intrahepatic bile duct: Secondary | ICD-10-CM | POA: Diagnosis not present

## 2019-12-26 DIAGNOSIS — N390 Urinary tract infection, site not specified: Secondary | ICD-10-CM

## 2019-12-26 DIAGNOSIS — R590 Localized enlarged lymph nodes: Secondary | ICD-10-CM | POA: Diagnosis not present

## 2019-12-26 DIAGNOSIS — G620 Drug-induced polyneuropathy: Secondary | ICD-10-CM | POA: Diagnosis not present

## 2019-12-26 DIAGNOSIS — C674 Malignant neoplasm of posterior wall of bladder: Secondary | ICD-10-CM | POA: Diagnosis not present

## 2019-12-26 DIAGNOSIS — D6481 Anemia due to antineoplastic chemotherapy: Secondary | ICD-10-CM | POA: Diagnosis not present

## 2019-12-26 DIAGNOSIS — C7800 Secondary malignant neoplasm of unspecified lung: Secondary | ICD-10-CM | POA: Diagnosis not present

## 2019-12-26 DIAGNOSIS — T451X5A Adverse effect of antineoplastic and immunosuppressive drugs, initial encounter: Secondary | ICD-10-CM | POA: Diagnosis not present

## 2019-12-26 MED ORDER — SULFAMETHOXAZOLE-TRIMETHOPRIM 800-160 MG PO TABS: 1.0000 | ORAL_TABLET | Freq: Two times a day (BID) | ORAL | 0 refills | Status: DC

## 2019-12-26 NOTE — Progress Notes (Signed)
DISCONTINUE ON PATHWAY REGIMEN - Bladder     A cycle is every 21 days:     Carboplatin      Gemcitabine   **Always confirm dose/schedule in your pharmacy ordering system**  REASON: Disease Progression PRIOR TREATMENT: [id number]: Carboplatin AUC=5 D1 + Gemcitabine 1,000 mg/m2 D1, 8 q21 Days for a Maximum of 6 Cycles TREATMENT RESPONSE: Partial Response (PR)  START ON PATHWAY REGIMEN - Bladder     A cycle is every 28 days:     Enfortumab vedotin-ejfv   **Always confirm dose/schedule in your pharmacy ordering system**  Patient Characteristics: Advanced/Metastatic Disease, Third Line and Beyond Therapeutic Status: Advanced/Metastatic Disease Line of Therapy: Third Line and Beyond  Intent of Therapy: Non-Curative / Palliative Intent, Discussed with Patient

## 2019-12-26 NOTE — Progress Notes (Signed)
Hematology and Oncology Follow Up   John Holt [id number] 08-28-1945 74 y.o. 12/26/2019 9:26 AM Avva, Steva Ready, MDAvva, Ravisankar, MD      Principle Diagnosis: 74 year old man with bladder cancer diagnosed in 2018 with localized disease.  He subsequently developed in April 2021 stage IV involvement including hepatic and bone disease.   Prior Therapy:  He is S/P  neoadjuvant chemotherapy in preparation for radical cystectomy utilizing MVAC for 4 cycles under the care of Dr. Marcello Moores at [facility].  He subsequently declined cystectomy for personal reasons.    He remained disease free till January 2020 where he has recurrence of non-muscle invasive disease.  He was started on Pembrolizumab in March 2020 after declining cystectomy.    Repeat TURBT in April 2020 showed persistent high-grade noninvasive papillary urothelial carcinoma.  He underwent BCG treatment in May 2020 under the care of Dr. Alyson Ingles.  Repeat TURBT in August 2020 showed muscle invasive disease without any evidence of metastatic disease based on imaging studies in June 2020.  He is status post radical cystectomy completed on February 25, 2019 with T3a N0 disease.  Gemcitabine and carboplatin started on August 12, 2019.  He completed 6 cycles of therapy on November 25, 2019.  Current therapy: Active surveillance and posttreatment care.  Interim History: John Holt returns today for a repeat evaluation.  Since the last visit, he reports no major changes in his health.  He feels that he is eating better and has gained more weight.  He does report right-sided flank and hip pain which has been intermittent in nature.  He denies any nausea, vomiting or neurological deficits.  He is able to ambulate and attends to most activities of daily living.    Medications: Reviewed without changes. Current Outpatient Medications  Medication Sig Dispense Refill  . cloBAZam (ONFI) 10 MG tablet Take 1 tablet every  night (Patient taking differently: Take 10 mg by mouth daily. ) 90 tablet 3  . clonazePAM (KLONOPIN) 0.5 MG tablet Take 1 tablet as needed for seizure cluster. Do not take more than 2-3 a week (Patient taking differently: Take 0.5 mg by mouth daily as needed (for seizure cluster). Do not take more than 2-3 a week) 10 tablet 5  . dexamethasone (DECADRON) 4 MG tablet Take 1 tablet (4 mg total) by mouth 2 (two) times daily with a meal. (Patient not taking: Reported on 11/09/2019) 30 tablet 0  . ibuprofen (ADVIL) 200 MG tablet Take 400 mg by mouth every 6 (six) hours as needed for moderate pain.    [name] levothyroxine (SYNTHROID) 137 MCG tablet Take 137 mcg by mouth every morning.    . Multiple Vitamin (MULTIVITAMIN WITH MINERALS) TABS tablet Take 1 tablet by mouth in the morning and at bedtime.     . ondansetron (ZOFRAN ODT) 4 MG disintegrating tablet 4mg  ODT q4 hours prn nausea/vomit (Patient not taking: Reported on 11/09/2019) 10 tablet 0  . polyethylene glycol (MIRALAX / GLYCOLAX) 17 g packet Take 17 g by mouth daily. (Patient taking differently: Take 17 g by mouth daily as needed for mild constipation or moderate constipation. ) 14 each 0  . prochlorperazine (COMPAZINE) 10 MG tablet Take 1 tablet (10 mg total) by mouth every 6 (six) hours as needed for nausea or vomiting. 30 tablet 0  . senna (SENOKOT) 8.6 MG TABS tablet Take 2 tablets (17.2 mg total) by mouth 2 (two) times daily. (Patient not taking: Reported on 11/09/2019) 120 tablet 0  . sulfamethoxazole-trimethoprim (  BACTRIM DS) 800-160 MG tablet Take 1 tablet by mouth 2 (two) times daily. 14 tablet 0  . zonisamide (ZONEGRAN) 100 MG capsule TAKE 3 CAPSULES EACH NIGHT (Patient taking differently: Take 300 mg by mouth at bedtime. ) 270 capsule 3   No current facility-administered medications for this visit.   Facility-Administered Medications Ordered in Other Visits  Medication Dose Route Frequency Provider Last Rate Last Admin  . 0.9 %  sodium chloride  infusion (Manually program via Guardrails IV Fluids)  250 mL Intravenous Once Wyatt Portela, MD      . heparin lock flush 100 unit/mL  500 Units Intracatheter Once PRN Wyatt Portela, MD      . sodium chloride flush (NS) 0.9 % injection 10 mL  10 mL Intracatheter PRN Wyatt Portela, MD         Allergies:  Allergies  Allergen Reactions  . Chlorhexidine Rash    Full body  . Povidone-Iodine Rash     Physical exam:   Blood pressure 110/60, pulse 77, temperature (!) 96.3 F (35.7 C), temperature source Tympanic, resp. rate 18, height 5\' 8"  (1.727 m), weight 130 lb 6.4 oz (59.1 kg), SpO2 99 %.     ECOG 1     General appearance: Comfortable appearing without any discomfort Head: Normocephalic without any trauma Oropharynx: Mucous membranes are moist and pink without any thrush or ulcers. Eyes: Pupils are equal and round reactive to light. Lymph nodes: No cervical, supraclavicular, inguinal or axillary lymphadenopathy.   Heart:regular rate and rhythm.  S1 and S2 without leg edema. Lung: Clear without any rhonchi or wheezes.  No dullness to percussion. Abdomin: Soft, nontender, nondistended with good bowel sounds.  No hepatosplenomegaly. Musculoskeletal: No joint deformity or effusion.  Full range of motion noted. Neurological: No deficits noted on motor, sensory and deep tendon reflex exam. Skin: No petechial rash or dryness.  Appeared moist.        Lab Results: Lab Results  Component Value Date   WBC 5.5 12/23/2019   HGB 7.6 (L) 12/23/2019   HCT 24.9 (L) 12/23/2019   MCV 91.9 12/23/2019   PLT 281 12/23/2019     Chemistry      Component Value Date/Time   NA 139 12/23/2019 1130   K 4.1 12/23/2019 1130   CL 109 12/23/2019 1130   CO2 22 12/23/2019 1130   BUN 25 (H) 12/23/2019 1130   CREATININE 0.84 12/23/2019 1130      Component Value Date/Time   CALCIUM 8.9 12/23/2019 1130   ALKPHOS 192 (H) 12/23/2019 1130   AST 40 12/23/2019 1130   ALT 29 12/23/2019 1130    BILITOT 0.3 12/23/2019 1130     IMPRESSION: 1. Interval resolution of the 2-3 mm left lower lobe pulmonary nodule identified as new on the previous study. No suspicious nodule or mass on today's exam. 2. Interval progression of hepatic metastases as above. 3. Upper abdominal lymphadenopathy is not substantially changed since the prior study. 4. Similar appearance of mild bilateral hydroureteronephrosis with right abdominal urinary conduit. 5. No substantial change in bony metastatic disease. 6. Moderate to large hiatal hernia. 7. Aortic Atherosclerosis (ICD10-I70.0).  Impression and Plan:  74 year old man with:  1.  Bladder cancer diagnosed in 2018.  He subsequently developed stage IV disease with hepatic involvement.   He is status post 6 cycles of palliative chemotherapy with improvement in his disease status clinically.  Imaging studies obtained on December 23, 2019 were personally reviewed and showed improvement in  his pulmonary metastasis and stable bone disease.  He did have an interval increase in his hepatic metastasis however.  Treatment options moving forward were reviewed which include continued active surveillance versus switching to a different salvage options such as Padcev.  After discussion today he is agreeable to proceed with Padcev.  Complications including neuropathy, hyperglycemia and infusion related complications were reiterated.  For better tolerance we will adjust the dosing and schedule to accommodate his recent travel to [place].  2.  IV access: Port-A-Cath remains in place without any complications.  3.  Antiemetics: No residual nausea or vomiting reported at this time.  He is eating and hydrating properly.  4.  Prognosis and goals of care: His disease is incurable although aggressive measures are still desired at this time.  5.  Urinary tract infection: He has been on Bactrim and completed a course.  We will have available refill for him if needed.  6.   Anemia: Hemoglobin is adequate at this time and will transfuse for hemoglobin less than 7.  7.  Follow-up: We will being in the next few weeks to start the first cycle of Padcev.    30  minutes were spent on this encounter.  The time was dedicated to reviewing his disease status, discussing treatment options, reviewing imaging studies and complications related to therapy.  Zola Button, MD 12/26/2019 9:26 AM

## 2019-12-29 NOTE — Progress Notes (Signed)
Pharmacist Chemotherapy Monitoring - Initial Assessment    Anticipated start date: 01/05/2020   Regimen:  . Are orders appropriate based on the patient's diagnosis, regimen, and cycle? Yes . Does the plan date match the patient's scheduled date? Yes . Is the sequencing of drugs appropriate? Yes . Are the premedications appropriate for the patient's regimen? Yes . Prior Authorization for treatment is: Pending o If applicable, is the correct biosimilar selected based on the patient's insurance? not applicable  Organ Function and Labs: [name] Are dose adjustments needed based on the patient's renal function, hepatic function, or hematologic function? Yes . Are appropriate labs ordered prior to the start of patient's treatment? Yes . Other organ system assessment, if indicated: N/A . The following baseline labs, if indicated, have been ordered: N/A  Dose Assessment: . Are the drug doses appropriate? Yes . Are the following correct: o Drug concentrations Yes o IV fluid compatible with drug Yes o Administration routes Yes o Timing of therapy Yes . If applicable, does the patient have documented access for treatment and/or plans for port-a-cath placement? yes . If applicable, have lifetime cumulative doses been properly documented and assessed? not applicable Lifetime Dose Tracking  . Carboplatin: 2,220 mg = 0.01 % of the maximum lifetime dose of 999,999,999 mg  o   Toxicity Monitoring/Prevention: . The patient has the following take home antiemetics prescribed: Prochlorperazine . The patient has the following take home medications prescribed: N/A . Medication allergies and previous infusion related reactions, if applicable, have been reviewed and addressed. No . The patient's current medication list has been assessed for drug-drug interactions with their chemotherapy regimen. no significant drug-drug interactions were identified on review.  Order Review: . Are the treatment plan orders  signed? Yes . Is the patient scheduled to see a provider prior to their treatment? No  I verify that I have reviewed each item in the above checklist and answered each question accordingly.  John Holt 12/29/2019 2:28 PM

## 2020-01-02 ENCOUNTER — Encounter: Payer: Self-pay | Admitting: Oncology

## 2020-01-03 ENCOUNTER — Other Ambulatory Visit: Payer: Self-pay | Admitting: Oncology

## 2020-01-03 ENCOUNTER — Telehealth: Payer: Self-pay

## 2020-01-03 DIAGNOSIS — C674 Malignant neoplasm of posterior wall of bladder: Secondary | ICD-10-CM

## 2020-01-03 NOTE — Telephone Encounter (Signed)
-----   Message from Wyatt Portela, MD sent at 01/03/2020  9:14 AM EDT ----- It will be arranged soon. Thanks ----- Message ----- From: Kennedy Bucker, LPN Sent: 11/28/811   9:07 AM EDT To: Wyatt Portela, MD  Patient's wife called stating patient is having more pain in his back in the spot he showed you at his last appointment. Wife states that an MRI was suggested and was wondering about having that done. Please advise. Thank you.   Maudie Mercury

## 2020-01-03 NOTE — Telephone Encounter (Signed)
Return phone call about MRI. Advised patient's wife per Dr Alen Blew that this is being arranged and scheduling will reach out once an appointment has been made. Patient's wife verbalized understanding.

## 2020-01-04 ENCOUNTER — Other Ambulatory Visit: Payer: Self-pay | Admitting: Oncology

## 2020-01-04 DIAGNOSIS — C674 Malignant neoplasm of posterior wall of bladder: Secondary | ICD-10-CM

## 2020-01-05 ENCOUNTER — Inpatient Hospital Stay: Payer: Medicare PPO

## 2020-01-05 ENCOUNTER — Other Ambulatory Visit: Payer: Self-pay

## 2020-01-05 ENCOUNTER — Telehealth: Payer: Self-pay

## 2020-01-05 ENCOUNTER — Inpatient Hospital Stay: Payer: Medicare PPO | Attending: Oncology

## 2020-01-05 VITALS — BP 106/71 | HR 70 | Temp 97.8°F | Resp 18 | Ht 68.0 in | Wt 129.8 lb

## 2020-01-05 DIAGNOSIS — C674 Malignant neoplasm of posterior wall of bladder: Secondary | ICD-10-CM | POA: Diagnosis not present

## 2020-01-05 DIAGNOSIS — Z5112 Encounter for antineoplastic immunotherapy: Secondary | ICD-10-CM | POA: Diagnosis not present

## 2020-01-05 DIAGNOSIS — D649 Anemia, unspecified: Secondary | ICD-10-CM | POA: Diagnosis not present

## 2020-01-05 LAB — CMP (CANCER CENTER ONLY)
ALT: 33 U/L (ref 0–44)
AST: 51 U/L — ABNORMAL HIGH (ref 15–41)
Albumin: 3 g/dL — ABNORMAL LOW (ref 3.5–5.0)
Alkaline Phosphatase: 282 U/L — ABNORMAL HIGH (ref 38–126)
Anion gap: 8 (ref 5–15)
BUN: 21 mg/dL (ref 8–23)
CO2: 22 mmol/L (ref 22–32)
Calcium: 9 mg/dL (ref 8.9–10.3)
Chloride: 110 mmol/L (ref 98–111)
Creatinine: 1.06 mg/dL (ref 0.61–1.24)
GFR, Est AFR Am: >60 mL/min (ref >60)
GFR, Estimated: >60 mL/min (ref >60)
Glucose, Bld: 86 mg/dL (ref 70–99)
Potassium: 4.1 mmol/L (ref 3.5–5.1)
Sodium: 140 mmol/L (ref 135–145)
Total Bilirubin: 0.3 mg/dL (ref 0.3–1.2)
Total Protein: 6 g/dL — ABNORMAL LOW (ref 6.5–8.1)

## 2020-01-05 LAB — SAMPLE TO BLOOD BANK

## 2020-01-05 LAB — CBC WITH DIFFERENTIAL (CANCER CENTER ONLY)
Abs Immature Granulocytes: 0.03 10*3/uL (ref 0.00–0.07)
Basophils Absolute: 0 10*3/uL (ref 0.0–0.1)
Basophils Relative: 1 %
Eosinophils Absolute: 1.5 10*3/uL — ABNORMAL HIGH (ref 0.0–0.5)
Eosinophils Relative: 24 %
HCT: 24 % — ABNORMAL LOW (ref 39.0–52.0)
Hemoglobin: 7.3 g/dL — ABNORMAL LOW (ref 13.0–17.0)
Immature Granulocytes: 1 %
Lymphocytes Relative: 10 %
Lymphs Abs: 0.7 10*3/uL (ref 0.7–4.0)
MCH: 26.8 pg (ref 26.0–34.0)
MCHC: 30.4 g/dL (ref 30.0–36.0)
MCV: 88.2 fL (ref 80.0–100.0)
Monocytes Absolute: 0.9 10*3/uL (ref 0.1–1.0)
Monocytes Relative: 15 %
Neutro Abs: 3.3 10*3/uL (ref 1.7–7.7)
Neutrophils Relative %: 49 %
Platelet Count: 281 10*3/uL (ref 150–400)
RBC: 2.72 MIL/uL — ABNORMAL LOW (ref 4.22–5.81)
RDW: 17.4 % — ABNORMAL HIGH (ref 11.5–15.5)
WBC Count: 6.5 10*3/uL (ref 4.0–10.5)
nRBC: 0 % (ref 0.0–0.2)

## 2020-01-05 MED ORDER — PALONOSETRON HCL INJECTION 0.25 MG/5ML
INTRAVENOUS | Status: AC
  Filled 2020-01-05: qty 5

## 2020-01-05 MED ORDER — SODIUM CHLORIDE 0.9% FLUSH
10.0000 mL | INTRAVENOUS | Status: DC | PRN
  Administered 2020-01-05: 10 mL
  Filled 2020-01-05: qty 10

## 2020-01-05 MED ORDER — SODIUM CHLORIDE 0.9 % IV SOLN
10.0000 mg | Freq: Once | INTRAVENOUS | Status: AC
  Administered 2020-01-05: 10 mg via INTRAVENOUS
  Filled 2020-01-05: qty 10

## 2020-01-05 MED ORDER — PALONOSETRON HCL INJECTION 0.25 MG/5ML
0.2500 mg | Freq: Once | INTRAVENOUS | Status: AC
  Administered 2020-01-05: 0.25 mg via INTRAVENOUS

## 2020-01-05 MED ORDER — SODIUM CHLORIDE 0.9 % IV SOLN
1.0100 mg/kg | Freq: Once | INTRAVENOUS | Status: AC
  Administered 2020-01-05: 60 mg via INTRAVENOUS
  Filled 2020-01-05: qty 6

## 2020-01-05 MED ORDER — HEPARIN SOD (PORK) LOCK FLUSH 100 UNIT/ML IV SOLN
500.0000 [IU] | Freq: Once | INTRAVENOUS | Status: AC | PRN
  Administered 2020-01-05: 500 [IU]
  Filled 2020-01-05: qty 5

## 2020-01-05 MED ORDER — SODIUM CHLORIDE 0.9 % IV SOLN
Freq: Once | INTRAVENOUS | Status: AC
  Filled 2020-01-05: qty 250

## 2020-01-05 NOTE — Patient Instructions (Signed)
[facility] Discharge Instructions for Patients Receiving Chemotherapy  Today you received the following chemotherapy agents: Enfortumab vedotin-ejfv (Padcev)  To help prevent nausea and vomiting after your treatment, we encourage you to take your nausea medication  as prescribed.    If you develop nausea and vomiting that is not controlled by your nausea medication, call the clinic.   BELOW ARE SYMPTOMS THAT SHOULD BE REPORTED IMMEDIATELY:  *FEVER GREATER THAN 100.5 F  *CHILLS WITH OR WITHOUT FEVER  NAUSEA AND VOMITING THAT IS NOT CONTROLLED WITH YOUR NAUSEA MEDICATION  *UNUSUAL SHORTNESS OF BREATH  *UNUSUAL BRUISING OR BLEEDING  TENDERNESS IN MOUTH AND THROAT WITH OR WITHOUT PRESENCE OF ULCERS  *URINARY PROBLEMS  *BOWEL PROBLEMS  UNUSUAL RASH Items with * indicate a potential emergency and should be followed up as soon as possible.  Feel free to call the clinic should you have any questions or concerns. The clinic phone number is [phone].  Please show the [facility] at check-in to the Emergency Department and triage nurse.    Enfortumab vedotin injection What is this medicine? ENFORTUMAB VEDOTIN (en FORT ue mab ve DOE tin) is a monoclonal antibody and a chemotherapy drug. It is used for treating urothelial cancer. This medicine may be used for other purposes; ask your health care provider or pharmacist if you have questions. COMMON BRAND NAME(S): PADCEV What should I tell my health care provider before I take this medicine? They need to know if you have any of these conditions:  diabetes  eye disease, vision problems  skin conditions or sensitivity  tingling of the fingers or toes, or other nerve disorder  an unusual or allergic reaction to enfortumab vedotin, other medicines, foods, dyes or preservatives  pregnant or trying to get pregnant  breast-feeding How should I use this medicine? This medicine is for infusion into a vein.  It is given by a health care professional in a hospital or clinic setting. Talk to your pediatrician regarding the use of this medicine in children. Special care may be needed. Overdosage: If you think you have taken too much of this medicine contact a poison control center or emergency room at once. NOTE: This medicine is only for you. Do not share this medicine with others. What if I miss a dose? Keep appointments for follow-up doses. It is important not to miss your dose. Call your doctor or health care professional if you are unable to keep an appointment. What may interact with this medicine? This medicine may also interact with the following medications:  certain antivirals for HIV or hepatitis  certain medicines for fungal infections like ketoconazole, itraconazole, or posaconazole  clarithromycin  grapefruit juice  idelalisib  nefazodone  telithromycin  troleandomycin This list may not describe all possible interactions. Give your health care provider a list of all the medicines, herbs, non-prescription drugs, or dietary supplements you use. Also tell them if you smoke, drink alcohol, or use illegal drugs. Some items may interact with your medicine. What should I watch for while using this medicine? Visit your health care professional for regular checks on your progress. Tell your health care professional if your symptoms do not start to get better or if they get worse. Your condition will be monitored carefully while you are receiving this medicine. Do not become pregnant while taking this medicine or for 2 months after stopping it. Women should inform their health care professional if they wish to become pregnant or think they might be  pregnant. Men should not father a child while taking this medicine and for 4 months after stopping it. There is potential for serious side effects to an unborn child. Talk to your health care professional for more information. Do not breast-feed  an infant while taking this medicine or for at least 3 weeks after stopping it. This medicine has caused decreased sperm counts in some men. This may make it more difficult to father a child. Talk to your health care professional if you are concerned about your fertility. If you notice an increased hunger or thirst, different from your normal hunger or thirst, or if you find that you have to urinate more frequently, you should contact your health care professional as soon as possible. you may need to have your blood sugar monitored. This medicine can cause changes in your blood sugar levels. You should monitor your blood sugar more frequently if you have diabetes. This medicine can cause a serious condition in which there is too much acid in your blood. If you develop nausea, vomiting, stomach pain, unusual tiredness, or breathing problems, stop taking this medicine and call your health care professional right away. If possible, use a ketone dipstick to check for ketones in your urine. This medicine may cause dry eyes [and blurred vision]. If you wear contact lenses, you may feel some discomfort. Lubricating eye drops may help. See your health care professional if the problem does not go away or is severe. Tell your health care professional right away if you have any change in your eyesight. This medicine may increase your risk of getting an infection. Call your health care professional for advice if you get a fever, chills, or sore throat, or other symptoms of a cold or flu. Do not treat yourself. Try to avoid being around people who are sick. What side effects may I notice from receiving this medicine? Side effects that you should report to your doctor or health care professional as soon as possible:  blurred vision  changes in vision  dry eyes  pain, tingling, numbness in the hands or feet  signs and symptoms of high blood sugar such as dizziness; dry mouth; dry skin; fruity breath; nausea;  stomach pain; increased hunger; increased thirst; increased urination  skin rash, itching or hives Side effects that usually do not require medical attention (report these to your doctor or health care professional if they continue or are bothersome):  changes in taste  diarrhea  dry skin  hair loss  loss of appetite  nausea, vomiting  weak or tired This list may not describe all possible side effects. Call your doctor for medical advice about side effects. You may report side effects to FDA at 1-800-FDA-1088. Where should I keep my medicine? This medicine is given in a hospital or clinic and will not be stored at home. NOTE: This sheet is a summary. It may not cover all possible information. If you have questions about this medicine, talk to your doctor, pharmacist, or health care provider.  2020 Elsevier/Gold Standard (2018-04-15 12:02:23)

## 2020-01-05 NOTE — Telephone Encounter (Signed)
Received a call from Lelan Pons in the lab to report patient has a blood bank hold and the hemoglobin is 7.3 today. Dr. Alen Blew made aware and per Dr. Alen Blew it is ok to treat today and no blood transfusion for now. Infusion RN made aware. Still awaiting CMP results.

## 2020-01-06 ENCOUNTER — Telehealth: Payer: Self-pay | Admitting: Neurology

## 2020-01-06 ENCOUNTER — Telehealth: Payer: Self-pay | Admitting: *Deleted

## 2020-01-06 ENCOUNTER — Ambulatory Visit: Payer: Medicare PPO | Admitting: Neurology

## 2020-01-06 NOTE — Telephone Encounter (Signed)
fyi

## 2020-01-06 NOTE — Telephone Encounter (Signed)
Patient came into the office and had a question for Dr. Delice Lesch. He will be going to [name] on 01/21/20 and expects to return on 02/04/20. He is worried that with the travel restrictions, he could be held up longer than anticipated. He currently will only have enough clobazam to have 1 extra. He would like to know if there is a way for him to take some extra with him in case he is held up. He would like for it to go to [facility] on [address]. He would like that [facility] to be put in as his main pharmacy as well.

## 2020-01-09 NOTE — Telephone Encounter (Signed)
Can you pls confirm that he is taking clobazam 10mg  every night, and if yes, I will send in a prescription to [facility] for early refill due to travel out of the country. We can try asking for a 90-day, but do they just want a 30-day Rx? thanks

## 2020-01-09 NOTE — Telephone Encounter (Signed)
Spoke with pt he is taken clobazam 10mg  every night, they would like a 30 day RX called in,

## 2020-01-10 MED ORDER — CLOBAZAM 10 MG PO TABS
ORAL_TABLET | ORAL | 1 refills | Status: DC
Start: 2020-01-10 — End: 2020-01-17

## 2020-01-10 NOTE — Telephone Encounter (Signed)
Rx sent 

## 2020-01-11 ENCOUNTER — Telehealth: Payer: Self-pay

## 2020-01-11 ENCOUNTER — Other Ambulatory Visit: Payer: Self-pay

## 2020-01-11 ENCOUNTER — Telehealth: Payer: Self-pay | Admitting: Oncology

## 2020-01-11 ENCOUNTER — Inpatient Hospital Stay: Payer: Medicare PPO

## 2020-01-11 DIAGNOSIS — C674 Malignant neoplasm of posterior wall of bladder: Secondary | ICD-10-CM

## 2020-01-11 DIAGNOSIS — D649 Anemia, unspecified: Secondary | ICD-10-CM

## 2020-01-11 DIAGNOSIS — Z5112 Encounter for antineoplastic immunotherapy: Secondary | ICD-10-CM | POA: Diagnosis not present

## 2020-01-11 LAB — CBC WITH DIFFERENTIAL (CANCER CENTER ONLY)
Abs Immature Granulocytes: 0.03 10*3/uL (ref 0.00–0.07)
Basophils Absolute: 0 10*3/uL (ref 0.0–0.1)
Basophils Relative: 0 %
Eosinophils Absolute: 1.3 10*3/uL — ABNORMAL HIGH (ref 0.0–0.5)
Eosinophils Relative: 18 %
HCT: 21.3 % — ABNORMAL LOW (ref 39.0–52.0)
Hemoglobin: 6.3 g/dL — CL (ref 13.0–17.0)
Immature Granulocytes: 0 %
Lymphocytes Relative: 9 %
Lymphs Abs: 0.7 10*3/uL (ref 0.7–4.0)
MCH: 25.5 pg — ABNORMAL LOW (ref 26.0–34.0)
MCHC: 29.6 g/dL — ABNORMAL LOW (ref 30.0–36.0)
MCV: 86.2 fL (ref 80.0–100.0)
Monocytes Absolute: 1.2 10*3/uL — ABNORMAL HIGH (ref 0.1–1.0)
Monocytes Relative: 17 %
Neutro Abs: 3.9 10*3/uL (ref 1.7–7.7)
Neutrophils Relative %: 56 %
Platelet Count: 280 10*3/uL (ref 150–400)
RBC: 2.47 MIL/uL — ABNORMAL LOW (ref 4.22–5.81)
RDW: 18.2 % — ABNORMAL HIGH (ref 11.5–15.5)
WBC Count: 7.2 10*3/uL (ref 4.0–10.5)
nRBC: 0 % (ref 0.0–0.2)

## 2020-01-11 LAB — CMP (CANCER CENTER ONLY)
ALT: 26 U/L (ref 0–44)
AST: 42 U/L — ABNORMAL HIGH (ref 15–41)
Albumin: 3 g/dL — ABNORMAL LOW (ref 3.5–5.0)
Alkaline Phosphatase: 246 U/L — ABNORMAL HIGH (ref 38–126)
Anion gap: 8 (ref 5–15)
BUN: 27 mg/dL — ABNORMAL HIGH (ref 8–23)
CO2: 25 mmol/L (ref 22–32)
Calcium: 9.3 mg/dL (ref 8.9–10.3)
Chloride: 107 mmol/L (ref 98–111)
Creatinine: 1.13 mg/dL (ref 0.61–1.24)
GFR, Est AFR Am: >60 mL/min (ref >60)
GFR, Estimated: >60 mL/min (ref >60)
Glucose, Bld: 88 mg/dL (ref 70–99)
Potassium: 4 mmol/L (ref 3.5–5.1)
Sodium: 140 mmol/L (ref 135–145)
Total Bilirubin: 0.2 mg/dL — ABNORMAL LOW (ref 0.3–1.2)
Total Protein: 6 g/dL — ABNORMAL LOW (ref 6.5–8.1)

## 2020-01-11 LAB — SAMPLE TO BLOOD BANK

## 2020-01-11 NOTE — Progress Notes (Signed)
CRITICAL VALUE STICKER  CRITICAL VALUE: hemoglobin 6.3  RECEIVER (on-site recipient of call): Meriel Flavors LPN  [place] NOTIFIED:  01/11/20 325pm MESSENGER (representative from lab): Suanne Marker MD NOTIFIED:  Dr Alen Blew TIME OF NOTIFICATION: 330 RESPONSE:  Noted, patient will be coming in for blood on Saturday.

## 2020-01-11 NOTE — Telephone Encounter (Signed)
-----   Message from Wyatt Portela, MD sent at 01/11/2020  9:51 AM EDT ----- Ok to arrange for 2 units  of blood. This week or next week. Thanks ----- Message ----- From: Kennedy Bucker, LPN Sent: 6/38/7564   9:44 AM EDT To: Wyatt Portela, MD  Returned phone call to wife. States that they will cancel. She states that they believe the [place] is working. Then she states that patient is pale and fatigued and stated his last hemoglobin was  7.3. They are going out of town next week and was wondering if he should come in to receive blood. Please advise. Thank you,  Maudie Mercury  ----- Message ----- From: Wyatt Portela, MD Sent: 01/11/2020   8:46 AM EDT To: Kennedy Bucker, LPN  They can cancel.  Thanks ----- Message ----- From: Kennedy Bucker, LPN Sent: 3/32/9518   8:34 AM EDT To: Wyatt Portela, MD  Patient's wife called states that pain he was having in his back has improved a great deal. They would like to know should he still have his CT done tomorrow. Please advise. Thank you.    Maudie Mercury

## 2020-01-11 NOTE — Telephone Encounter (Signed)
Scheduled appt per 9/15 sch msg - pt aware of appt

## 2020-01-11 NOTE — Telephone Encounter (Signed)
Patient has been scheduled to receive two units of blood on Saturday.

## 2020-01-11 NOTE — Telephone Encounter (Signed)
-----   Message from Wyatt Portela, MD sent at 01/11/2020  8:46 AM EDT ----- They can cancel.  Thanks ----- Message ----- From: Kennedy Bucker, LPN Sent: 1/61/0960   8:34 AM EDT To: Wyatt Portela, MD  Patient's wife called states that pain he was having in his back has improved a great deal. They would like to know should he still have his CT done tomorrow. Please advise. Thank you.    Maudie Mercury

## 2020-01-12 ENCOUNTER — Other Ambulatory Visit: Payer: Self-pay

## 2020-01-12 ENCOUNTER — Ambulatory Visit (HOSPITAL_COMMUNITY): Payer: Medicare PPO

## 2020-01-12 ENCOUNTER — Encounter (HOSPITAL_COMMUNITY): Payer: Self-pay

## 2020-01-12 DIAGNOSIS — D649 Anemia, unspecified: Secondary | ICD-10-CM

## 2020-01-12 MED ORDER — DIPHENHYDRAMINE HCL 25 MG PO CAPS: 25.0000 mg | ORAL_CAPSULE | Freq: Once | ORAL | Status: DC

## 2020-01-13 LAB — PREPARE RBC (CROSSMATCH)

## 2020-01-14 ENCOUNTER — Other Ambulatory Visit: Payer: Self-pay

## 2020-01-14 ENCOUNTER — Inpatient Hospital Stay: Payer: Medicare PPO

## 2020-01-14 VITALS — BP 137/71 | HR 54 | Temp 98.2°F | Resp 18 | Ht 68.0 in

## 2020-01-14 DIAGNOSIS — Z5112 Encounter for antineoplastic immunotherapy: Secondary | ICD-10-CM | POA: Diagnosis not present

## 2020-01-14 DIAGNOSIS — C674 Malignant neoplasm of posterior wall of bladder: Secondary | ICD-10-CM

## 2020-01-14 DIAGNOSIS — D649 Anemia, unspecified: Secondary | ICD-10-CM

## 2020-01-14 DIAGNOSIS — Z95828 Presence of other vascular implants and grafts: Secondary | ICD-10-CM

## 2020-01-14 MED ORDER — ACETAMINOPHEN 325 MG PO TABS
650.0000 mg | ORAL_TABLET | Freq: Once | ORAL | Status: AC
  Administered 2020-01-14: 650 mg via ORAL

## 2020-01-14 MED ORDER — SODIUM CHLORIDE 0.9% FLUSH
10.0000 mL | Freq: Once | INTRAVENOUS | Status: AC
  Administered 2020-01-14: 10 mL
  Filled 2020-01-14: qty 10

## 2020-01-14 MED ORDER — SODIUM CHLORIDE 0.9% IV SOLUTION
250.0000 mL | Freq: Once | INTRAVENOUS | Status: AC
  Administered 2020-01-14: 250 mL via INTRAVENOUS
  Filled 2020-01-14: qty 250

## 2020-01-14 MED ORDER — HEPARIN SOD (PORK) LOCK FLUSH 100 UNIT/ML IV SOLN
500.0000 [IU] | Freq: Once | INTRAVENOUS | Status: AC
  Administered 2020-01-14: 500 [IU]
  Filled 2020-01-14: qty 5

## 2020-01-14 NOTE — Patient Instructions (Signed)

## 2020-01-15 LAB — TYPE AND SCREEN
ABO/RH(D): A POS
Antibody Screen: NEGATIVE
Unit division: 0
Unit division: 0

## 2020-01-15 LAB — BPAM RBC
Blood Product Expiration Date: 202110062359
Blood Product Expiration Date: 202110062359
ISSUE DATE / TIME: 202109180920
ISSUE DATE / TIME: 202109180920
Unit Type and Rh: 6200
Unit Type and Rh: 6200

## 2020-01-17 ENCOUNTER — Telehealth: Payer: Self-pay

## 2020-01-17 ENCOUNTER — Encounter: Payer: Self-pay | Admitting: Medical

## 2020-01-17 MED ORDER — CLOBAZAM 10 MG PO TABS
ORAL_TABLET | ORAL | 0 refills | Status: DC
Start: 2020-01-17 — End: 2020-02-15

## 2020-01-17 NOTE — Telephone Encounter (Signed)
Patient's wife called in asking for a note stating patient had nephrostomy, to make it easier as patient goes [place] [employer]. Lucianne Lei PA sent a letter though [place] and patient wife was made aware.

## 2020-01-19 DIAGNOSIS — Z03818 Encounter for observation for suspected exposure to other biological agents ruled out: Secondary | ICD-10-CM | POA: Diagnosis not present

## 2020-01-19 DIAGNOSIS — Z1152 Encounter for screening for COVID-19: Secondary | ICD-10-CM | POA: Diagnosis not present

## 2020-02-08 ENCOUNTER — Telehealth: Payer: Self-pay

## 2020-02-08 NOTE — Telephone Encounter (Signed)
-----   Message from Wyatt Portela, MD sent at 02/08/2020 10:04 AM EDT ----- Ok. I will address tomorrow. Thanks ----- Message ----- From: Tami Lin, RN Sent: 02/08/2020   9:43 AM EDT To: Wyatt Portela, MD  Patient called to report swelling in bilateral legs and painful muscle spasms at night. Patient returned from a trip to [place], [place], and [place] this past Saturday. Per patient he wore compression hose every time he was on the plane. Per patient the swelling and pain started this past Friday and has continued since he returned home. Patient has an appointment with you tomorrow morning but wanted to make you aware.  John Holt

## 2020-02-08 NOTE — Telephone Encounter (Signed)
Patient made aware that Dr. Alen Blew will address concerns at tomorrow' visit. Patient verbalized understanding.

## 2020-02-09 ENCOUNTER — Inpatient Hospital Stay: Payer: Medicare PPO

## 2020-02-09 ENCOUNTER — Other Ambulatory Visit: Payer: Self-pay

## 2020-02-09 ENCOUNTER — Inpatient Hospital Stay: Payer: Medicare PPO | Admitting: Oncology

## 2020-02-09 ENCOUNTER — Inpatient Hospital Stay: Payer: Medicare PPO | Attending: Oncology

## 2020-02-09 ENCOUNTER — Telehealth: Payer: Self-pay

## 2020-02-09 ENCOUNTER — Other Ambulatory Visit: Payer: Self-pay | Admitting: Oncology

## 2020-02-09 VITALS — BP 104/68 | HR 72 | Temp 96.9°F | Resp 20 | Wt 132.0 lb

## 2020-02-09 VITALS — BP 117/67 | HR 67 | Temp 98.4°F | Resp 18

## 2020-02-09 DIAGNOSIS — C674 Malignant neoplasm of posterior wall of bladder: Secondary | ICD-10-CM

## 2020-02-09 DIAGNOSIS — Z5112 Encounter for antineoplastic immunotherapy: Secondary | ICD-10-CM | POA: Insufficient documentation

## 2020-02-09 DIAGNOSIS — D6481 Anemia due to antineoplastic chemotherapy: Secondary | ICD-10-CM

## 2020-02-09 DIAGNOSIS — C787 Secondary malignant neoplasm of liver and intrahepatic bile duct: Secondary | ICD-10-CM | POA: Diagnosis not present

## 2020-02-09 DIAGNOSIS — D649 Anemia, unspecified: Secondary | ICD-10-CM | POA: Diagnosis not present

## 2020-02-09 DIAGNOSIS — Z452 Encounter for adjustment and management of vascular access device: Secondary | ICD-10-CM | POA: Diagnosis not present

## 2020-02-09 DIAGNOSIS — Z95828 Presence of other vascular implants and grafts: Secondary | ICD-10-CM

## 2020-02-09 LAB — CBC WITH DIFFERENTIAL (CANCER CENTER ONLY)
Abs Immature Granulocytes: 0.05 10*3/uL (ref 0.00–0.07)
Basophils Absolute: 0 10*3/uL (ref 0.0–0.1)
Basophils Relative: 0 %
Eosinophils Absolute: 1.2 10*3/uL — ABNORMAL HIGH (ref 0.0–0.5)
Eosinophils Relative: 16 %
HCT: 22.4 % — ABNORMAL LOW (ref 39.0–52.0)
Hemoglobin: 6.7 g/dL — CL (ref 13.0–17.0)
Immature Granulocytes: 1 %
Lymphocytes Relative: 9 %
Lymphs Abs: 0.7 10*3/uL (ref 0.7–4.0)
MCH: 24.1 pg — ABNORMAL LOW (ref 26.0–34.0)
MCHC: 29.9 g/dL — ABNORMAL LOW (ref 30.0–36.0)
MCV: 80.6 fL (ref 80.0–100.0)
Monocytes Absolute: 0.7 10*3/uL (ref 0.1–1.0)
Monocytes Relative: 10 %
Neutro Abs: 4.5 10*3/uL (ref 1.7–7.7)
Neutrophils Relative %: 64 %
Platelet Count: 285 10*3/uL (ref 150–400)
RBC: 2.78 MIL/uL — ABNORMAL LOW (ref 4.22–5.81)
RDW: 19 % — ABNORMAL HIGH (ref 11.5–15.5)
WBC Count: 7.1 10*3/uL (ref 4.0–10.5)
nRBC: 0 % (ref 0.0–0.2)

## 2020-02-09 LAB — CMP (CANCER CENTER ONLY)
ALT: 26 U/L (ref 0–44)
AST: 51 U/L — ABNORMAL HIGH (ref 15–41)
Albumin: 2.9 g/dL — ABNORMAL LOW (ref 3.5–5.0)
Alkaline Phosphatase: 299 U/L — ABNORMAL HIGH (ref 38–126)
Anion gap: 5 (ref 5–15)
BUN: 24 mg/dL — ABNORMAL HIGH (ref 8–23)
CO2: 21 mmol/L — ABNORMAL LOW (ref 22–32)
Calcium: 9.2 mg/dL (ref 8.9–10.3)
Chloride: 109 mmol/L (ref 98–111)
Creatinine: 1.42 mg/dL — ABNORMAL HIGH (ref 0.61–1.24)
GFR, Estimated: 48 mL/min — ABNORMAL LOW (ref >60)
Glucose, Bld: 95 mg/dL (ref 70–99)
Potassium: 4.2 mmol/L (ref 3.5–5.1)
Sodium: 135 mmol/L (ref 135–145)
Total Bilirubin: 0.3 mg/dL (ref 0.3–1.2)
Total Protein: 5.9 g/dL — ABNORMAL LOW (ref 6.5–8.1)

## 2020-02-09 LAB — SAMPLE TO BLOOD BANK

## 2020-02-09 LAB — PREPARE RBC (CROSSMATCH)

## 2020-02-09 MED ORDER — SODIUM CHLORIDE 0.9 % IV SOLN
1.0100 mg/kg | Freq: Once | INTRAVENOUS | Status: AC
  Administered 2020-02-09: 60 mg via INTRAVENOUS
  Filled 2020-02-09: qty 6

## 2020-02-09 MED ORDER — SODIUM CHLORIDE 0.9% FLUSH
10.0000 mL | Freq: Once | INTRAVENOUS | Status: AC
  Administered 2020-02-09: 10 mL
  Filled 2020-02-09: qty 10

## 2020-02-09 MED ORDER — PALONOSETRON HCL INJECTION 0.25 MG/5ML
0.2500 mg | Freq: Once | INTRAVENOUS | Status: AC
  Administered 2020-02-09: 0.25 mg via INTRAVENOUS

## 2020-02-09 MED ORDER — SODIUM CHLORIDE 0.9 % IV SOLN
10.0000 mg | Freq: Once | INTRAVENOUS | Status: AC
  Administered 2020-02-09: 10 mg via INTRAVENOUS
  Filled 2020-02-09: qty 10

## 2020-02-09 MED ORDER — DIPHENHYDRAMINE HCL 25 MG PO CAPS
25.0000 mg | ORAL_CAPSULE | Freq: Once | ORAL | Status: AC
  Administered 2020-02-09: 25 mg via ORAL

## 2020-02-09 MED ORDER — SODIUM CHLORIDE 0.9 % IV SOLN
Freq: Once | INTRAVENOUS | Status: AC
  Filled 2020-02-09: qty 250

## 2020-02-09 MED ORDER — SODIUM CHLORIDE 0.9% IV SOLUTION
250.0000 mL | Freq: Once | INTRAVENOUS | Status: AC
  Filled 2020-02-09: qty 250

## 2020-02-09 MED ORDER — FUROSEMIDE 20 MG PO TABS: 20.0000 mg | ORAL_TABLET | Freq: Every day | ORAL | 1 refills | Status: DC

## 2020-02-09 MED ORDER — SODIUM CHLORIDE 0.9% FLUSH
10.0000 mL | INTRAVENOUS | Status: DC | PRN
  Administered 2020-02-09: 10 mL
  Filled 2020-02-09: qty 10

## 2020-02-09 MED ORDER — SODIUM CHLORIDE 0.9% FLUSH
3.0000 mL | INTRAVENOUS | Status: DC | PRN
  Filled 2020-02-09: qty 10

## 2020-02-09 MED ORDER — FUROSEMIDE 10 MG/ML IJ SOLN
20.0000 mg | Freq: Once | INTRAMUSCULAR | Status: AC
  Administered 2020-02-09: 20 mg via INTRAVENOUS

## 2020-02-09 MED ORDER — PALONOSETRON HCL INJECTION 0.25 MG/5ML
INTRAVENOUS | Status: AC
  Filled 2020-02-09: qty 5

## 2020-02-09 MED ORDER — FUROSEMIDE 10 MG/ML IJ SOLN
INTRAMUSCULAR | Status: AC
  Filled 2020-02-09: qty 2

## 2020-02-09 MED ORDER — HEPARIN SOD (PORK) LOCK FLUSH 100 UNIT/ML IV SOLN
500.0000 [IU] | Freq: Once | INTRAVENOUS | Status: AC | PRN
  Administered 2020-02-09: 500 [IU]
  Filled 2020-02-09: qty 5

## 2020-02-09 MED ORDER — ACETAMINOPHEN 325 MG PO TABS
ORAL_TABLET | ORAL | Status: AC
  Filled 2020-02-09: qty 2

## 2020-02-09 MED ORDER — SODIUM CHLORIDE 0.9% IV SOLUTION
250.0000 mL | Freq: Once | INTRAVENOUS | Status: AC
  Administered 2020-02-09: 250 mL via INTRAVENOUS
  Filled 2020-02-09: qty 250

## 2020-02-09 MED ORDER — DIPHENHYDRAMINE HCL 25 MG PO CAPS
ORAL_CAPSULE | ORAL | Status: AC
  Filled 2020-02-09: qty 1

## 2020-02-09 MED ORDER — ACETAMINOPHEN 325 MG PO TABS
650.0000 mg | ORAL_TABLET | Freq: Once | ORAL | Status: AC
  Administered 2020-02-09: 650 mg via ORAL

## 2020-02-09 NOTE — Telephone Encounter (Signed)
Per Dr. Alen Blew it is ok to treat with today's labs.

## 2020-02-09 NOTE — Progress Notes (Signed)
Hematology and Oncology Follow Up   John Holt [id number] 06-Mar-1946 74 y.o. 02/09/2020 8:30 AM Avva, [name], MD      Principle Diagnosis: 74 year old man with stage IV bladder cancer with hepatic metastasis and bone involvement documented in April 2021.  He was initially diagnosed in 2018 with localized disease.    Prior Therapy:  He is S/P  neoadjuvant chemotherapy in preparation for radical cystectomy utilizing MVAC for 4 cycles under the care of Dr. Marcello Moores at [facility].  He subsequently declined cystectomy for personal reasons.    He remained disease free till January 2020 where he has recurrence of non-muscle invasive disease.  He was started on Pembrolizumab in March 2020 after declining cystectomy.    Repeat TURBT in April 2020 showed persistent high-grade noninvasive papillary urothelial carcinoma.  He underwent BCG treatment in May 2020 under the care of Dr. Alyson Ingles.  Repeat TURBT in August 2020 showed muscle invasive disease without any evidence of metastatic disease based on imaging studies in June 2020.  He is status post radical cystectomy completed on February 25, 2019 with T3a N0 disease.  Gemcitabine and carboplatin started on August 12, 2019.  He completed 6 cycles of therapy on November 25, 2019.  Current therapy: Padcev 1 mg/kg every other week started on January 05, 2020.  He is here for the second cycle of therapy.  Interim History: [name] presents today for a repeat evaluation.  Since last visit, he received day 1 cycle 1 of Padcev and day 15 was postponed based on his wishes so he can travel.  Upon return from his travel, he reports feeling reasonably fair although he has experienced lower extremity swelling and fatigue.  He denies any nausea vomiting or abdominal pain.  He denies any increased bone pain or pathological fractures.    Medications: Updated on review. Current Outpatient Medications  Medication  Sig Dispense Refill  . cloBAZam (ONFI) 10 MG tablet Take 1 tablet every night 5 tablet 0  . clonazePAM (KLONOPIN) 0.5 MG tablet Take 1 tablet as needed for seizure cluster. Do not take more than 2-3 a week (Patient taking differently: Take 0.5 mg by mouth daily as needed (for seizure cluster). Do not take more than 2-3 a week) 10 tablet 5  . ibuprofen (ADVIL) 200 MG tablet Take 400 mg by mouth every 6 (six) hours as needed for moderate pain.    [name] levothyroxine (SYNTHROID) 137 MCG tablet Take 137 mcg by mouth every morning.    . Multiple Vitamin (MULTIVITAMIN WITH MINERALS) TABS tablet Take 1 tablet by mouth in the morning and at bedtime.     . polyethylene glycol (MIRALAX / GLYCOLAX) 17 g packet Take 17 g by mouth daily. (Patient taking differently: Take 17 g by mouth daily as needed for mild constipation or moderate constipation. ) 14 each 0  . prochlorperazine (COMPAZINE) 10 MG tablet Take 1 tablet (10 mg total) by mouth every 6 (six) hours as needed for nausea or vomiting. 30 tablet 0  . senna (SENOKOT) 8.6 MG TABS tablet Take 2 tablets (17.2 mg total) by mouth 2 (two) times daily. 120 tablet 0  . sulfamethoxazole-trimethoprim (BACTRIM DS) 800-160 MG tablet Take 1 tablet by mouth 2 (two) times daily. 28 tablet 0  . zonisamide (ZONEGRAN) 100 MG capsule TAKE 3 CAPSULES EACH NIGHT (Patient taking differently: Take 300 mg by mouth at bedtime. ) 270 capsule 3   No current facility-administered medications for this visit.  Facility-Administered Medications Ordered in Other Visits  Medication Dose Route Frequency Provider Last Rate Last Admin  . 0.9 %  sodium chloride infusion (Manually program via Guardrails IV Fluids)  250 mL Intravenous Once Wyatt Portela, MD      . diphenhydrAMINE (BENADRYL) capsule 25 mg  25 mg Oral Once Wyatt Portela, MD         Allergies:  Allergies  Allergen Reactions  . Chlorhexidine Rash    Full body  . Povidone-Iodine Rash     Physical exam:   Blood  pressure 104/68, pulse 72, temperature (!) 96.9 F (36.1 C), temperature source Tympanic, resp. rate 20, weight 132 lb (59.9 kg), SpO2 98 %.      ECOG 1    General appearance: Alert, awake without any distress. Head: Atraumatic without abnormalities Oropharynx: Without any thrush or ulcers. Eyes: No scleral icterus. Lymph nodes: No lymphadenopathy noted in the cervical, supraclavicular, or axillary nodes Heart:regular rate and rhythm, without any murmurs or gallops.    Bilateral 2+ edema noted at the ankles. Lung: Clear to auscultation without any rhonchi, wheezes or dullness to percussion. Abdomin: Soft, nontender without any shifting dullness or ascites. Musculoskeletal: No clubbing or cyanosis. Neurological: No motor or sensory deficits. Skin: No rashes or lesions.        Lab Results: Lab Results  Component Value Date   WBC 7.2 01/11/2020   HGB 6.3 (LL) 01/11/2020   HCT 21.3 (L) 01/11/2020   MCV 86.2 01/11/2020   PLT 280 01/11/2020     Chemistry      Component Value Date/Time   NA 140 01/11/2020 1505   K 4.0 01/11/2020 1505   CL 107 01/11/2020 1505   CO2 25 01/11/2020 1505   BUN 27 (H) 01/11/2020 1505   CREATININE 1.13 01/11/2020 1505      Component Value Date/Time   CALCIUM 9.3 01/11/2020 1505   ALKPHOS 246 (H) 01/11/2020 1505   AST 42 (H) 01/11/2020 1505   ALT 26 01/11/2020 1505   BILITOT 0.2 (L) 01/11/2020 1505      Impression and Plan:  74 year old man with:  1.  Stage IV bladder cancer with hepatic and bone disease diagnosed in April 2021.     He is currently receiving palliative therapy with Padcev and received day 1 cycle 1 of therapy.  Risks and benefits of continuing this treatment were discussed today.  Potential complications that includes neuropathy, hyperglycemia among others were reviewed.  He is agreeable to continue at this time.  2.  IV access: Port-A-Cath continues to be in use without any issues.  3.  Antiemetics: Compazine is  available to him without any recent nausea vomiting.  4.  Prognosis and goals of care: Therapy remains palliative although aggressive measures are warranted given his excellent performance status.  5.  Lower extremity edema: Could be related to worsening anemia versus chemotherapy effect.  We will start him on low-dose Lasix monitor electrolytes.  6.  Anemia: He status post transfusion in September 2021.  Hemoglobin is down again to 6.7 and will receive units of packed red cell transfusion.  7.  Follow-up: He will continue to follow every other week for treatment and will return in 4 weeks for repeat evaluation.    30  minutes were dedicated to this visit.  Time was spent on reviewing disease status, discussing treatment options and addressing complications related to cancer and cancer therapy.  Zola Button, MD 02/09/2020 8:30 AM

## 2020-02-09 NOTE — Patient Instructions (Signed)
[facility] Discharge Instructions for Patients Receiving Chemotherapy  Today you received the following chemotherapy agent: Padcev   To help prevent nausea and vomiting after your treatment, we encourage you to take your nausea medication as directed by your MD.   If you develop nausea and vomiting that is not controlled by your nausea medication, call the clinic.   BELOW ARE SYMPTOMS THAT SHOULD BE REPORTED IMMEDIATELY:  *FEVER GREATER THAN 100.5 F  *CHILLS WITH OR WITHOUT FEVER  NAUSEA AND VOMITING THAT IS NOT CONTROLLED WITH YOUR NAUSEA MEDICATION  *UNUSUAL SHORTNESS OF BREATH  *UNUSUAL BRUISING OR BLEEDING  TENDERNESS IN MOUTH AND THROAT WITH OR WITHOUT PRESENCE OF ULCERS  *URINARY PROBLEMS  *BOWEL PROBLEMS  UNUSUAL RASH Items with * indicate a potential emergency and should be followed up as soon as possible.  Feel free to call the clinic should you have any questions or concerns. The clinic phone number is [phone].  Please show the [facility] at check-in to the Emergency Department and triage nurse.   Blood Transfusion, Adult, Care After This sheet gives you information about how to care for yourself after your procedure. Your doctor may also give you more specific instructions. If you have problems or questions, contact your doctor. What can I expect after the procedure? After the procedure, it is common to have:  Bruising and soreness at the IV site.  A fever or chills on the day of the procedure. This may be your body's response to the new blood cells received.  A headache. Follow these instructions at home: Insertion site care      Follow instructions from your doctor about how to take care of your insertion site. This is where an IV tube was put into your vein. Make sure you: ? Wash your hands with soap and water before and after you change your bandage (dressing). If you cannot use soap and water, use hand  sanitizer. ? Change your bandage as told by your doctor.  Check your insertion site every day for signs of infection. Check for: ? Redness, swelling, or pain. ? Bleeding from the site. ? Warmth. ? Pus or a bad smell. General instructions  Take over-the-counter and prescription medicines only as told by your doctor.  Rest as told by your doctor.  Go back to your normal activities as told by your doctor.  Keep all follow-up visits as told by your doctor. This is important. Contact a doctor if:  You have itching or red, swollen areas of skin (hives).  You feel worried or nervous (anxious).  You feel weak after doing your normal activities.  You have redness, swelling, warmth, or pain around the insertion site.  You have blood coming from the insertion site, and the blood does not stop with pressure.  You have pus or a bad smell coming from the insertion site. Get help right away if:  You have signs of a serious reaction. This may be coming from an allergy or the body's defense system (immune system). Signs include: ? Trouble breathing or shortness of breath. ? Swelling of the face or feeling warm (flushed). ? Fever or chills. ? Head, chest, or back pain. ? Dark pee (urine) or blood in the pee. ? Widespread rash. ? Fast heartbeat. ? Feeling dizzy or light-headed. You may receive your blood transfusion in an outpatient setting. If so, you will be told whom to contact to report any reactions. These symptoms may be an emergency. Do  not wait to see if the symptoms will go away. Get medical help right away. Call your local emergency services (911 in the [place] Do not drive yourself to the hospital. Summary  Bruising and soreness at the IV site are common.  Check your insertion site every day for signs of infection.  Rest as told by your doctor. Go back to your normal activities as told by your doctor.  Get help right away if you have signs of a serious reaction. This  information is not intended to replace advice given to you by your health care provider. Make sure you discuss any questions you have with your health care provider. Document Revised: 10/07/2018 Document Reviewed: 10/07/2018 Elsevier Patient Education  [place].

## 2020-02-10 LAB — TYPE AND SCREEN
ABO/RH(D): A POS
Antibody Screen: NEGATIVE
Unit division: 0
Unit division: 0

## 2020-02-10 LAB — BPAM RBC
Blood Product Expiration Date: 202111012359
Blood Product Expiration Date: 202111012359
ISSUE DATE / TIME: 202110141253
ISSUE DATE / TIME: 202110141253
Unit Type and Rh: 6200
Unit Type and Rh: 6200

## 2020-02-15 ENCOUNTER — Other Ambulatory Visit: Payer: Self-pay

## 2020-02-15 ENCOUNTER — Encounter: Payer: Self-pay | Admitting: Neurology

## 2020-02-15 ENCOUNTER — Ambulatory Visit: Payer: Medicare PPO | Admitting: Neurology

## 2020-02-15 VITALS — BP 110/70 | HR 68 | Resp 18 | Ht 68.0 in | Wt 132.0 lb

## 2020-02-15 DIAGNOSIS — G40219 Localization-related (focal) (partial) symptomatic epilepsy and epileptic syndromes with complex partial seizures, intractable, without status epilepticus: Secondary | ICD-10-CM | POA: Diagnosis not present

## 2020-02-15 DIAGNOSIS — Z23 Encounter for immunization: Secondary | ICD-10-CM | POA: Diagnosis not present

## 2020-02-15 DIAGNOSIS — F419 Anxiety disorder, unspecified: Secondary | ICD-10-CM | POA: Diagnosis not present

## 2020-02-15 DIAGNOSIS — R252 Cramp and spasm: Secondary | ICD-10-CM | POA: Diagnosis not present

## 2020-02-15 DIAGNOSIS — E039 Hypothyroidism, unspecified: Secondary | ICD-10-CM | POA: Diagnosis not present

## 2020-02-15 DIAGNOSIS — C674 Malignant neoplasm of posterior wall of bladder: Secondary | ICD-10-CM | POA: Diagnosis not present

## 2020-02-15 DIAGNOSIS — I872 Venous insufficiency (chronic) (peripheral): Secondary | ICD-10-CM | POA: Diagnosis not present

## 2020-02-15 MED ORDER — ZONISAMIDE 100 MG PO CAPS: ORAL_CAPSULE | ORAL | 3 refills | Status: AC

## 2020-02-15 MED ORDER — CLOBAZAM 10 MG PO TABS: ORAL_TABLET | ORAL | 3 refills | Status: AC

## 2020-02-15 NOTE — Patient Instructions (Signed)
Always good to see you! Looking great. Continue all your medications. Follow-up in 6 months, call for any changes.   Seizure Precautions: 1. If medication has been prescribed for you to prevent seizures, take it exactly as directed.  Do not stop taking the medicine without talking to your doctor first, even if you have not had a seizure in a long time.   2. Avoid activities in which a seizure would cause danger to yourself or to others.  Don't operate dangerous machinery, swim alone, or climb in high or dangerous places, such as on ladders, roofs, or girders.  Do not drive unless your doctor says you may.  3. If you have any warning that you may have a seizure, lay down in a safe place where you can't hurt yourself.    4.  No driving for 6 months from last seizure, as per [facility].   Please refer to the following link on the [place] website for more information: http://[email or web address]/[email or web address]/driving/drivingu.cfm   5.  Maintain good sleep hygiene. Avoid alcohol.  6.  Contact your doctor if you have any problems that may be related to the medicine you are taking.  7.  Call 911 and bring the patient back to the ED if:        A.  The seizure lasts longer than 5 minutes.       B.  The patient doesn't awaken shortly after the seizure  C.  The patient has new problems such as difficulty seeing, speaking or moving  D.  The patient was injured during the seizure  E.  The patient has a temperature over 102 F (39C)  F.  The patient vomited and now is having trouble breathing

## 2020-02-15 NOTE — Progress Notes (Signed)
NEUROLOGY FOLLOW UP OFFICE NOTE  John Holt [id number] 27-Oct-1945  HISTORY OF PRESENT ILLNESS: I had the pleasure of seeing John Holt in follow-up in the neurology clinic on 76/20/2021.  The patient was last seen 5 months ago for intractable epilepsy. He is again accompanied by his wife [name] who helps supplement the history today.  Records and images were personally reviewed where available.  He continues on palliative therapy with [facility] for stage IV bladder cancer with hepatic and bone disease. Since his last visit, they have travelled to [name] and had a great time. He has not had any seizures since April 2021. He is on Zonisamide 300mg  qhs and Clobazam 10mg  qhs without side effects. Attempts to wean dow clobazam in the past led to increased seizures. He has a VNS and denies any issues. [name] reports he is a bit subdued sometimes, "my wife says I'm moody." Sometimes his speech slows down, this may relate to his other medical issues. He was started on Lasix due to leg swelling from his trip. He sleeps very well however has been having significant leg cramps early in the morning since their return, waking him up at 4am. He still has a little back pain but definitely better.    History on Initial Assessment 05/13/2017: This is a very pleasant 74 yo RH man with a history of focal epilepsy of right mesial temporal or right mesial frontal onset. He had been seeing epileptologist Dr. Jacelyn Grip for several years, records were reviewed. He reports staring episodes in childhood. In his 21s, he recalls having episodes of a sinking feeling in his stomach with deja vu occurring around once a month initially. When he was started on seizure medications, these episodes occur rarely, around 1-2 a year. His wife started noticing possible symptoms in his 76s, they were in a meeting and she noticed him swaying back and forth but seemed unaware of it. He started having nocturnal spells more than 15 years ago, where he  would have "leg thrusting" (bicycling type leg movements), lip smacking, often followed by arousal with disorientation, need to urinate and walking around (at times resembling sleepwalking). He has had episodes where he would have a cluster of seizures then become paranoid and manic with significant post-ictal psychosis. He usually has nocturnal seizures every 3 weeks or so. His wife would give him prn clonazepam when he has clusters, and this has helped "zap" them. He has not needed clonazepam in at least 1.5 years. He has rare daytime seizures, his wife recalls the seizures in 2006 and 2013 with post-ictal psychosis, there is note of a spell of undressing and confusion in the [employer] at [place] in 2015. In the past he has been on Lamictal, Depakote, Gabapentin. Gabapentin has been tapered off. He has been taking Onfi for several years and feels this has been the most helpful. He was recently diagnosed with bladder cancer in October 2018 and underwent resection and chemotherapy. They report that towards the end of chemo, he had some incidents with seizures. He has no recollection of the events. His wife reports that after he had his second infusion and the PICC line was taken out, he started having slight feet movements and was staring and unresponsive. He had another episode while shoveling snow, he came in and started talking and his speech became garbled, "words were not right" for 60 seconds. Three days after on the way home from chemo, he had a similar episode with his speech. He was  talking about Aristotle then his speech became garbled. The last daytime event was on 04/28/17. He started swaying side to side, speech was garbled, and he was staring off for a few minutes. Due to these daytime episodes, his Onfi dose was temporarily increased to 40mg  qhs. They report he has not had any nocturnal seizures since November, which is unusual for him. The higher dose of Onfi knocks him out. They report he is done  with chemotherapy and will be seeing his oncologist to discuss next steps.  He denies any headaches, dizziness, diplopia, dysarthria/dysphagia, neck/back pain, focal numbness/tingling/weakness, bowel/bladder dysfunction, no falls. He had muscle cramps, taking magnesium supplements has helped. His memory is what bothers him a lot. They mostly notice problems with his "narrative memory." He would not recall watching a movie 2-3 weeks prior. He cannot recall anecdotes. He would make notes on an article, and later find that he had already made notes on the same article previously. He is able to remember abstract concepts from dense philosophy books he reads. His spatial memory is not good. He denies missing medications. Every once in a while he forgets his medications (twice in the past month). He drives only to the grocery and denies getting lost. He has had Neuropsychological testing, report unavailable for review.  Update 12/23/2017: On his last visit, he reported feeling bad, more depressed about his memory, and felt Onfi was contributing a lot to his symptoms. He had a seizure on 09/01/17 during wakefulness. He had a repeat MRI brain on 10/14/17 which showed asymmetric right hippocampal volume loss suggestive of mesial temporal sclerosis. He was started on Zonisamide and did very well with no seizures for 9 weeks (baseline nocturnal seizures every 2 weeks or so). He reduced Onfi to 20mg  and his wife reported he was doing great, his sense of humor was back, he was laughing, at one point his wife thought the euphoria was almost too much. She noticed this as a big difference from how he was the past couple of years where he had a "flattening" of mood. Unfortunately, after he reduced the Onfi to 10mg  on 12/02/17, his wife noticed almost immediately that his mood was a little darker and he was more irritable. He had a nocturnal seizure on 8/9, then another on 8/14. With each seizure, his mood was going "way, way down."  He was instructed in increase Zonisamide to 400mg  qhs. He then had a seizure during the afternoon of 12/18/17. He is tearful in the office today and has some difficulty finding his words, reporting it was the end of a stressful week, he had been having feelings of anxiety that week. He had no warning and is amnestic of the seizure, his wife reports he clasped his hands together in front of him and was doing repetitive digging movements, unresponsive. He then smiled at her and grabbed her arms, jerking her around like he was dancing with her. This lasted 2-3 minutes, then he started answering questions. No focal weakness, tongue bite, or incontinence. He called our office and was instructed to increase Onfi back to 20mg  daily and Zonisamide down to 300mg  qhs. He has felt normal this week. He has had tingling in his feet with the Zonisamide, this has quieted down but not completely gone away. He denies any headaches, dizziness, vision changes, focal weakness, no falls.   Prior AEDs: Depakote, Lamictal, Tegretol, Trileptal, Keppra, Vimpat, Gabapentin  Epilepsy Risk Factors: His father had rare spells 1-2 times a year of "zonking  out." He and his brother had "spasms" as babies, none after age 34/3. He recalls having a head injury in his teenage years. Otherwise he had a normal birth and early development, no history of febrile convulsions, CNS infections, or neurosurgical procedures.   Prior workup: MRI brain 01/2014: subtle findings consistent with right mesial temporal sclerosis EEGs: EEG 2009 - right temporal spike activity and right TIRDA EEG 01/2009 - normal EEG 07/2012 - normal  MRI 2006 - normal  PET Brain 2016 - right mesial temporal hypometabolism.  EMU 04/25/2014 - 05/04/2014 6 events captured on camera, 1 off camera - repeated neck extension and flexion, followed by pelvic thrusting and body rocking movements. Appear to localize to the right temporal region, although clinically suggestive of  frontal onset. Patient taken off Depakote and Lamictal and switched to high dose gabapentin  Neuropsychological testing in June 2019 indicated mild neurocognitive disorder due to seizure disorder. Testing and daily functioning not consistent with dementia. Testing revealed mostly normal cognitive functioning. However, he did demonstrate mildly reduced visual-spatial construction and more impaired visual memory, consistent with right hemisphere involvement and in particular mesial temporal lobe involvement. He also demonstrated impairment in verbal memory of non-contextual information, but memory of contextual information was intact, suggesting more of a problem with frontal-subcortical networks than hippocampal consolidation dysfunction.   PAST MEDICAL HISTORY: Past Medical History:  Diagnosis Date  . Anxiety disorder   . Bladder cancer [facility]) urologist-- dr Alyson Ingles ([facility]) and dr Lawerance Bach [facility]):  oncologist-  dr Alen Blew ([facility]) & dr Marcello Moores [facility])   dx 2018--- s/p  TURBT's ;  hx BCG tx's.  chemotherapy completed 2019, and immunotherapy Keytruda , last one 10-21-2018  . BPH (benign prostatic hypertrophy) with urinary obstruction   . History of adenomatous polyp of colon   . History of closed head injury    2000-- fell off ladder--  temporary memory loss resolved  . History of psychosis    x2  last one documented 2006  w/ hallucination  and suicide ideation  . Nocturia   . Port-A-Cath in place 2018  . S/P placement of VNS (vagus nerve stimulation) device 01/28/2018   per pt swipe magnet twice daily  . Sigmoid diverticulosis   . Sinus bradycardia seen on cardiac monitor    cardiology --  dr g. taylor;   event monitor results 11-15-2018 in epic,  NSR w/ ST and SB and a nocturnal pause   . Temporal lobe epilepsy syndrome [facility]) neurologist-  dr Raliegh Ip. Delice Lesch-  avergae one every 2 weeks "legs jerking around, per wife , pt unaware he's doing this"   localization-related right  temporal lobe --  mostly nocturnal w/ bilateral lower extremitity movement, staring and moaning then dissorietation afterwards (12-02-2018 per pt last daytime seizure 11-11-2018 and last nighttime seizure 11-29-2018  . Vesicoureteral reflux, bilateral   . Wears glasses     MEDICATIONS: Current Outpatient Medications on File Prior to Visit  Medication Sig Dispense Refill  . cloBAZam (ONFI) 10 MG tablet Take 1 tablet every night 5 tablet 0  . clonazePAM (KLONOPIN) 0.5 MG tablet Take 1 tablet as needed for seizure cluster. Do not take more than 2-3 a week (Patient taking differently: Take 0.5 mg by mouth daily as needed (for seizure cluster). Do not take more than 2-3 a week) 10 tablet 5  . furosemide (LASIX) 20 MG tablet TAKE 1 TABLET(20 MG) BY MOUTH DAILY 90 tablet 0  . ibuprofen (ADVIL) 200 MG tablet Take 400  mg by mouth every 6 (six) hours as needed for moderate pain.    [name] levothyroxine (SYNTHROID) 137 MCG tablet Take 137 mcg by mouth every morning.    . Multiple Vitamin (MULTIVITAMIN WITH MINERALS) TABS tablet Take 1 tablet by mouth in the morning and at bedtime.     . polyethylene glycol (MIRALAX / GLYCOLAX) 17 g packet Take 17 g by mouth daily. (Patient taking differently: Take 17 g by mouth daily as needed for mild constipation or moderate constipation. ) 14 each 0  . PREVIDENT 5000 BOOSTER PLUS 1.1 % PSTE     . prochlorperazine (COMPAZINE) 10 MG tablet Take 1 tablet (10 mg total) by mouth every 6 (six) hours as needed for nausea or vomiting. 30 tablet 0  . senna (SENOKOT) 8.6 MG TABS tablet Take 2 tablets (17.2 mg total) by mouth 2 (two) times daily. 120 tablet 0  . sulfamethoxazole-trimethoprim (BACTRIM DS) 800-160 MG tablet Take 1 tablet by mouth 2 (two) times daily. 28 tablet 0  . zonisamide (ZONEGRAN) 100 MG capsule TAKE 3 CAPSULES EACH NIGHT (Patient taking differently: Take 300 mg by mouth at bedtime. ) 270 capsule 3   Current Facility-Administered Medications on File Prior to Visit    Medication Dose Route Frequency Provider Last Rate Last Admin  . 0.9 %  sodium chloride infusion (Manually program via Guardrails IV Fluids)  250 mL Intravenous Once Wyatt Portela, MD      . diphenhydrAMINE (BENADRYL) capsule 25 mg  25 mg Oral Once Wyatt Portela, MD        ALLERGIES: Allergies  Allergen Reactions  . Chlorhexidine Rash    Full body  . Povidone-Iodine Rash    FAMILY HISTORY: Family History  Problem Relation Age of Onset  . Seizures Father   . Heart disease Father   . Cancer Father        prostat  . Cancer Mother        stomach  . Stomach cancer Mother   . Colon cancer Neg Hx   . Colon polyps Neg Hx   . Esophageal cancer Neg Hx   . Rectal cancer Neg Hx     SOCIAL HISTORY: Social History   Socioeconomic History  . Marital status: Married    Spouse name: [name]  . Number of children: 2  . Years of education: PhD  . 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 Smoking status: Former Smoker    Packs/day: 1.00    Years: 15.00    Pack years: 15.00    Types: Cigarettes    Quit date: 04/03/1972    Years since quitting: 47.9  . Smokeless tobacco: Never Used  Vaping Use  . Vaping Use: Never used  Substance and Sexual Activity  . Alcohol use: Yes    Alcohol/week: 14.0 standard drinks    Types: 14 Glasses of wine per week    Comment: 1 or 2  wine daily; no consumption in last 24 hours  . Drug use: No  . Sexual activity: Yes    Partners: Female  Other Topics Concern  . Not on file  Social History Narrative   Pt lives at home with his wife. One story home.      Left handed      Phd in English      Caffeine Use- 2 cups daily   Social Determinants of  Health   Financial Resource Strain:   . Difficulty of Paying Living Expenses: Not on file  Food Insecurity:   . Worried About Charity fundraiser in the Last Year: Not on file  . Ran Out of Food  in the Last Year: Not on file  Transportation Needs:   . Lack of Transportation (Medical): Not on file  . Lack of Transportation (Non-Medical): Not on file  Physical Activity:   . Days of Exercise per Week: Not on file  . Minutes of Exercise per Session: Not on file  Stress:   . Feeling of Stress : Not on file  Social Connections:   . Frequency of Communication with Friends and Family: Not on file  . Frequency of Social Gatherings with Friends and Family: Not on file  . Attends Religious Services: Not on file  . Active Member of Clubs or Organizations: Not on file  . Attends Archivist Meetings: Not on file  . Marital Status: Not on file  Intimate Partner Violence:   . Fear of Current or Ex-Partner: Not on file  . Emotionally Abused: Not on file  . Physically Abused: Not on file  . Sexually Abused: Not on file     PHYSICAL EXAM: Vitals:   02/15/20 1547  BP: 110/70  Pulse: 68  Resp: 18  SpO2: 98%   General: No acute distress Head:  Normocephalic/atraumatic Skin/Extremities: No rash, no edema Neurological Exam: alert and awake. No aphasia or dysarthria. Fund of knowledge is appropriate.  Recent and remote memory are intact.  Attention and concentration are normal.   Cranial nerves: Pupils equal, round. Extraocular movements intact with no nystagmus. Visual fields full.  No facial asymmetry.  Motor: Bulk and tone normal, muscle strength 5/5 throughout with no pronator drift.   Finger to nose testing intact.  Gait narrow-based and steady,no ataxia.  VNS Therapy Management: Parameters Output Current (mA): 1.75 Signal Frequency (Hz): 20 Pulse Width (usec): 250 Signal ON Time (sec): 30 Signal OFF Time (min): 3 Magnet Output Current (mA): 2 Magnet ON Time (sec): 60 Magnet Pulse Width (usec): 250 AutoStim Output Current (mA): 1.875 AutoStim Pulse Width (usec): 250 AutoStim ON Time (sec): 30 Tachycardia Detection : On Heartbeat Detection Sensitivity: 1 Perform  Verify Heartbeat Detection: yes Threshold for AutoStim (%): 20 Diagnostics Current Delievered (mA): 1.75 Lead Impedance: OK Impedence Value (Ohms): 2463 Battery Status Indicator (color): Green (75-100%)   IMPRESSION: This is a very pleasant 74 yo RH man with a history of focal epilepsy of right mesial temporal or right mesial frontal onset. Repeat MRI brain showed right hippocampal asymmetry suggestive of mesial temporal sclerosis. PET scan showed right mesial temporal hypometabolism. EEGs in the past showed right temporal spikes and TIRDA, EMU admission captured seizures that appeared to localize to the right temporal area although clinically suggestive of frontal onset. He has been doing well from a seizure-standpoint, seizure-free since April 2021 (at that time found to have liver and spine metastases). Continue Zonisamide 300mg  qhs and clobazam 10mg  qhs. VNS interrogated today, no changes made, not at end of life. He does not drive. Follow-up in 6 months, they know to call for any changes.    Thank you for allowing me to participate in his care.  Please do not hesitate to call for any questions or concerns.   Ellouise Newer, M.D.   CC: Dr. Dagmar Hait, Dr. Alen Blew

## 2020-02-22 ENCOUNTER — Telehealth: Payer: Self-pay

## 2020-02-22 NOTE — Telephone Encounter (Signed)
Spoke to [employer], PA and patient scheduled for a 12:00 appointment in symptom management tomorrow. Patient's wife made aware.

## 2020-02-22 NOTE — Telephone Encounter (Signed)
-----   Message from Wyatt Portela, MD sent at 02/22/2020  9:09 AM EDT ----- Yes. Thanks ----- Message ----- From: Tami Lin, RN Sent: 02/22/2020   9:03 AM EDT To: Wyatt Portela, MD  Patient is scheduled for [facility] tomorrow. His wife called and wants to make you aware that patient woke up with severe back pain this morning which is slightly better now. She also said patient is sleeping a lot and fatigued and said "I can't go on like this." She thinks patient may need to be seen before his treatment tomorrow. I can check with symptom management to see if he can be added on tomorrow if you agree.  Lanelle Bal

## 2020-02-23 ENCOUNTER — Other Ambulatory Visit: Payer: Self-pay

## 2020-02-23 ENCOUNTER — Inpatient Hospital Stay: Payer: Medicare PPO

## 2020-02-23 ENCOUNTER — Inpatient Hospital Stay (HOSPITAL_BASED_OUTPATIENT_CLINIC_OR_DEPARTMENT_OTHER): Payer: Medicare PPO | Admitting: Medical

## 2020-02-23 VITALS — BP 126/78 | HR 63 | Temp 96.0°F | Resp 18 | Ht 68.0 in | Wt 129.2 lb

## 2020-02-23 DIAGNOSIS — Z95828 Presence of other vascular implants and grafts: Secondary | ICD-10-CM

## 2020-02-23 DIAGNOSIS — Z452 Encounter for adjustment and management of vascular access device: Secondary | ICD-10-CM | POA: Diagnosis not present

## 2020-02-23 DIAGNOSIS — Z5112 Encounter for antineoplastic immunotherapy: Secondary | ICD-10-CM | POA: Diagnosis not present

## 2020-02-23 DIAGNOSIS — M791 Myalgia, unspecified site: Secondary | ICD-10-CM | POA: Diagnosis not present

## 2020-02-23 DIAGNOSIS — D649 Anemia, unspecified: Secondary | ICD-10-CM | POA: Diagnosis not present

## 2020-02-23 DIAGNOSIS — C674 Malignant neoplasm of posterior wall of bladder: Secondary | ICD-10-CM

## 2020-02-23 DIAGNOSIS — C787 Secondary malignant neoplasm of liver and intrahepatic bile duct: Secondary | ICD-10-CM | POA: Diagnosis not present

## 2020-02-23 DIAGNOSIS — R5383 Other fatigue: Secondary | ICD-10-CM

## 2020-02-23 LAB — CBC WITH DIFFERENTIAL (CANCER CENTER ONLY)
Abs Immature Granulocytes: 0.03 10*3/uL (ref 0.00–0.07)
Basophils Absolute: 0.1 10*3/uL (ref 0.0–0.1)
Basophils Relative: 1 %
Eosinophils Absolute: 1.3 10*3/uL — ABNORMAL HIGH (ref 0.0–0.5)
Eosinophils Relative: 20 %
HCT: 27.6 % — ABNORMAL LOW (ref 39.0–52.0)
Hemoglobin: 8.4 g/dL — ABNORMAL LOW (ref 13.0–17.0)
Immature Granulocytes: 1 %
Lymphocytes Relative: 8 %
Lymphs Abs: 0.5 10*3/uL — ABNORMAL LOW (ref 0.7–4.0)
MCH: 25.1 pg — ABNORMAL LOW (ref 26.0–34.0)
MCHC: 30.4 g/dL (ref 30.0–36.0)
MCV: 82.4 fL (ref 80.0–100.0)
Monocytes Absolute: 0.7 10*3/uL (ref 0.1–1.0)
Monocytes Relative: 10 %
Neutro Abs: 4 10*3/uL (ref 1.7–7.7)
Neutrophils Relative %: 60 %
Platelet Count: 283 10*3/uL (ref 150–400)
RBC: 3.35 MIL/uL — ABNORMAL LOW (ref 4.22–5.81)
RDW: 20.9 % — ABNORMAL HIGH (ref 11.5–15.5)
WBC Count: 6.5 10*3/uL (ref 4.0–10.5)
nRBC: 0 % (ref 0.0–0.2)

## 2020-02-23 LAB — CMP (CANCER CENTER ONLY)
ALT: 23 U/L (ref 0–44)
AST: 50 U/L — ABNORMAL HIGH (ref 15–41)
Albumin: 3 g/dL — ABNORMAL LOW (ref 3.5–5.0)
Alkaline Phosphatase: 283 U/L — ABNORMAL HIGH (ref 38–126)
Anion gap: 9 (ref 5–15)
BUN: 39 mg/dL — ABNORMAL HIGH (ref 8–23)
CO2: 19 mmol/L — ABNORMAL LOW (ref 22–32)
Calcium: 9.5 mg/dL (ref 8.9–10.3)
Chloride: 107 mmol/L (ref 98–111)
Creatinine: 1.82 mg/dL — ABNORMAL HIGH (ref 0.61–1.24)
GFR, Estimated: 38 mL/min — ABNORMAL LOW (ref >60)
Glucose, Bld: 106 mg/dL — ABNORMAL HIGH (ref 70–99)
Potassium: 3.9 mmol/L (ref 3.5–5.1)
Sodium: 135 mmol/L (ref 135–145)
Total Bilirubin: 0.2 mg/dL — ABNORMAL LOW (ref 0.3–1.2)
Total Protein: 6.2 g/dL — ABNORMAL LOW (ref 6.5–8.1)

## 2020-02-23 LAB — SAMPLE TO BLOOD BANK

## 2020-02-23 MED ORDER — SODIUM CHLORIDE 0.9% FLUSH
10.0000 mL | Freq: Once | INTRAVENOUS | Status: AC
  Administered 2020-02-23: 10 mL
  Filled 2020-02-23: qty 10

## 2020-02-23 MED ORDER — PALONOSETRON HCL INJECTION 0.25 MG/5ML
INTRAVENOUS | Status: AC
  Filled 2020-02-23: qty 5

## 2020-02-23 MED ORDER — HEPARIN SOD (PORK) LOCK FLUSH 100 UNIT/ML IV SOLN
500.0000 [IU] | Freq: Once | INTRAVENOUS | Status: AC
  Administered 2020-02-23: 500 [IU]
  Filled 2020-02-23: qty 5

## 2020-02-23 NOTE — Patient Instructions (Signed)

## 2020-02-23 NOTE — Progress Notes (Signed)
[facility] Progress Note   John Holt [id number] 09-Jul-1945 74 y.o.  John Holt is managed by Dr. Zola Button  Actively treated with chemotherapy/immunotherapy/hormonal therapy: yes  Current therapy: Padcev  Last treated: 02/09/2020 (cycle #1, day #8)  Next scheduled appointment with provider: Mar 15, 2020  Assessment: Plan:    Port-A-Cath in place - Plan: heparin lock flush 100 unit/mL, sodium chloride flush (NS) 0.9 % injection 10 mL  Malignant neoplasm of posterior wall of urinary bladder ([place]) - Plan: heparin lock flush 100 unit/mL, sodium chloride flush (NS) 0.9 % injection 10 mL  Fatigue, unspecified type  Myalgia   Malignant neoplasm of the urinary bladder with fatigue and myalgias: John Holt continues to be managed by Dr. Alen Blew and is status post cycle 1, day 8 of Padcev which was dosed on 02/09/2020.  Dr. Alen Blew has decided to hold his treatment today and will have him return in 2 weeks as scheduled for reevaluation.  Dr. Alen Blew tell John Holt and his wife that he may need to dose reduce his treatment in order to lessen side effects.  Please see After Visit Summary for patient specific instructions.  Future Appointments  Date Time Provider [place]  15-Mar-2020  9:45 AM [facility] LAB [facility] None  March 15, 2020 10:00 AM [facility] FLUSH [facility] None  Mar 15, 2020 10:30 AM Wyatt Portela, MD [facility] None  March 15, 2020 11:45 AM [facility] INFUSION [facility] None  03/10/2020  8:30 AM [facility] INFUSION [facility] None  03/23/2020 11:30 AM [facility] LAB [facility] None  03/23/2020 12:30 PM [facility] INFUSION [facility] None  09/20/2020 11:30 AM Cameron Sprang, MD [facility] None    No orders of the defined types were placed in this encounter.      Subjective:   Patient ID:  John Holt is a 74 y.o. (DOB Feb 09, 1946) male.  Chief Complaint:  Chief Complaint  Patient presents with  . Fatigue     HPI SAVALAS MONJE  is a 74 y.o. male with a diagnosis of a clinic stage IV bladder cancer with hepatic metastasis and bone involvement documented in April 2021.  He was initially diagnosed in 2018 with localized disease.  John Holt is status post neoadjuvant chemotherapy which was given in preparation for a radical cystectomy.  He was treated with MVAC for 4 cycles under the care of Dr. Marcello Moores at [facility].  He subsequently declined a cystectomy for personal reasons.  He remained without evidence of disease until January 2020 when he had a recurrence of a nonmuscle invasive disease.  He was begun on pembrolizumab in March 2020.  A repeat TURBT from April 2020 showed a persistent high-grade noninvasive papillary urothelial carcinoma.  He was treated with BCG in May 2020 under the care of Dr. Alyson Ingles.  He had a repeat TURBT completed in August 2020 which showed muscle invasive disease without any evidence of metastatic disease based on imaging studies in June 2020.  He agreed to proceed with a radical cystectomy on October 30 of 2020 and was noted to have T3a N0 disease. He was treated with gemcitabine and carboplatin which was begun on August 12, 2019 with his sixth cycle completed on November 25, 2019.  He is currently being treated with Padcev at 1 mg/kg every other week with his first treatment dosed on January 05, 2020.    He is status post cycle 1, day 8 of therapy which was dosed on 02/09/2020.  He presents to the clinic today with  fatigue, lower back pain, dyspnea on exertion, bilateral lower extremity muscle cramps especially at night, mild bilateral lower extremity edema, increased somnolence with difficulty staying awake, and some slowing of his thought processes.  Medications: I have reviewed the patient's current medications.  Allergies: No Known Allergies  Past Medical History:  Diagnosis Date  . Anxiety disorder   . Bladder cancer [facility]) urologist-- dr Alyson Ingles  ([facility]) and dr Lawerance Bach [facility]rology):  oncologist-  dr Alen Blew ([facility]) & dr Marcello Moores [facility])   dx 2018--- s/p  TURBT's ;  hx BCG tx's.  chemotherapy completed 2019, and immunotherapy Keytruda , last one 10-21-2018  . BPH (benign prostatic hypertrophy) with urinary obstruction   . History of adenomatous polyp of colon   . History of closed head injury    2000-- fell off ladder--  temporary memory loss resolved  . History of psychosis    x2  last one documented 2006  w/ hallucination  and suicide ideation  . Nocturia   . Port-A-Cath in place 2018  . S/P placement of VNS (vagus nerve stimulation) device 01/28/2018   per pt swipe magnet twice daily  . Sigmoid diverticulosis   . Sinus bradycardia seen on cardiac monitor    cardiology --  dr g. taylor;   event monitor results 11-15-2018 in epic,  NSR w/ ST and SB and a nocturnal pause   . Temporal lobe epilepsy syndrome [facility]) neurologist-  dr Raliegh Ip. Delice Lesch-  avergae one every 2 weeks "legs jerking around, per wife , pt unaware he's doing this"   localization-related right temporal lobe --  mostly nocturnal w/ bilateral lower extremitity movement, staring and moaning then dissorietation afterwards (12-02-2018 per pt last daytime seizure 11-11-2018 and last nighttime seizure 11-29-2018  . Vesicoureteral reflux, bilateral   . Wears glasses     Past Surgical History:  Procedure Laterality Date  . COLONOSCOPY    . COLONOSCOPY W/ POLYPECTOMY  09-11-2009  . CYSTOSCOPY W/ RETROGRADES Bilateral 08/13/2018   Procedure: CYSTOSCOPY WITH RETROGRADE PYELOGRAM;  Surgeon: Cleon Gustin, MD;  Location: [facility];  Service: Urology;  Laterality: Bilateral;  . CYSTOSCOPY WITH INJECTION N/A 02/25/2019   Procedure: CYSTOSCOPY WITH INJECTION;  Surgeon: Alexis Frock, MD;  Location: [facility];  Service: Urology;  Laterality: N/A;  . GREEN LIGHT LASER TURP (TRANSURETHRAL RESECTION OF PROSTATE N/A 04/07/2014   Procedure: GREEN LIGHT LASER TURP (TRANSURETHRAL  RESECTION OF PROSTATE;  Surgeon: Ailene Rud, MD;  Location: [facility];  Service: Urology;  Laterality: N/A;  . INGUINAL HERNIA REPAIR Bilateral 04/15/2013   Procedure: LAPAROSCOPIC BILATERAL INGUINAL HERNIA REPAIR;  Surgeon: Gayland Curry, MD;  Location: [facility];  Service: General;  Laterality: Bilateral;  . INSERTION OF MESH N/A 04/15/2013   Procedure: INSERTION OF MESH;  Surgeon: Gayland Curry, MD;  Location: [facility];  Service: General;  Laterality: N/A;  . PORTACATH PLACEMENT  2018  . TRANSURETHRAL RESECTION OF BLADDER  01-27-2017;  05-26-2017;  05-25-2018  both by dr Chriss Czar davis @[facility]   . TRANSURETHRAL RESECTION OF BLADDER TUMOR N/A 08/13/2018   Procedure: TRANSURETHRAL RESECTION OF BLADDER TUMOR (TURBT);  Surgeon: Cleon Gustin, MD;  Location: [facility];  Service: Urology;  Laterality: N/A;  1 HR  . TRANSURETHRAL RESECTION OF BLADDER TUMOR N/A 12/03/2018   Procedure: TRANSURETHRAL RESECTION OF BLADDER TUMOR (TURBT);  Surgeon: Cleon Gustin, MD;  Location: [facility];  Service: Urology;  Laterality: N/A;  . UMBILICAL HERNIA REPAIR N/A 04/15/2013  Procedure: OPEN HERNIA REPAIR UMBILICAL ADULT;  Surgeon: Gayland Curry, MD;  Location: [facility];  Service: General;  Laterality: N/A;  . VAGUS NERVE STIMULATOR INSERTION Left 01/28/2018   Procedure: VAGAL NERVE STIMULATOR PLACEMENT;  Surgeon: Consuella Lose, MD;  Location: [place];  Service: Neurosurgery;  Laterality: Left;  VAGAL NERVE STIMULATOR PLACEMENT    Family History  Problem Relation Age of Onset  . Seizures Father   . Heart disease Father   . Cancer Father        prostat  . Cancer Mother        stomach  . Stomach cancer Mother   . Colon cancer Neg Hx   . Colon polyps Neg Hx   . Esophageal cancer Neg Hx   . Rectal cancer Neg Hx     Social History   Socioeconomic History  . Marital status: Married    Spouse name: [name]  . Number of children: 2  . Years of education: PhD  . 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 Smoking status: Former Smoker    Packs/day: 1.00    Years: 15.00    Pack years: 15.00    Types: Cigarettes    Quit date: 04/03/1972    Years since quitting: 47.9  . Smokeless tobacco: Never Used  Vaping Use  . Vaping Use: Never used  Substance and Sexual Activity  . Alcohol use: Yes    Alcohol/week: 14.0 standard drinks    Types: 14 Glasses of wine per week    Comment: 1 or 2  wine daily; no consumption in last 24 hours  . Drug use: No  . Sexual activity: Yes    Partners: Female  Other Topics Concern  . Not on file  Social History Narrative   Pt lives at home with his wife. One story home.      Left handed      Phd in English      Caffeine Use- 2 cups daily   Social Determinants of Health   Financial Resource Strain:   . Difficulty of Paying Living Expenses: Not on file  Food Insecurity:   . Worried About Charity fundraiser in the Last Year: Not on file  . Ran Out of Food in the Last Year: Not on file  Transportation Needs:   . Lack of Transportation (Medical): Not on file  . Lack of Transportation (Non-Medical): Not on file  Physical Activity:   . Days of Exercise per Week: Not on file  . Minutes of Exercise per Session: Not on file  Stress:   . Feeling of Stress : Not on file  Social Connections:   . Frequency of Communication with Friends and Family: Not on file  . Frequency of Social Gatherings with Friends and Family: Not on file  . Attends Religious Services: Not on file  . Active Member of Clubs or Organizations: Not on file  . Attends Archivist Meetings: Not on file  . Marital Status: Not on file  Intimate Partner Violence:   . Fear of Current or Ex-Partner: Not on file  . Emotionally Abused: Not on file  . Physically Abused: Not on file  . Sexually Abused: Not on file    Past Medical History,  Surgical history, Social history, and Family history were reviewed and updated as appropriate.   Please see review of systems  for further details on the patient's review from today.   Review of Systems:  Review of Systems  Constitutional: Positive for fatigue. Negative for chills, diaphoresis and fever.  HENT: Negative for trouble swallowing and voice change.   Respiratory: Negative for cough, chest tightness, shortness of breath and wheezing.   Cardiovascular: Positive for leg swelling. Negative for chest pain and palpitations.  Gastrointestinal: Negative for abdominal pain, constipation, diarrhea, nausea and vomiting.  Musculoskeletal: Positive for back pain and myalgias.  Neurological: Negative for dizziness, light-headedness and headaches.  Psychiatric/Behavioral: Positive for decreased concentration.    Objective:   Physical Exam:  BP 126/78 (BP Location: Right Arm, Patient Position: Sitting)   Pulse 63   Temp (!) 96 F (35.6 C) (Tympanic)   Resp 18   Ht 5\' 8"  (1.727 m)   Wt 129 lb 3.2 oz (58.6 kg)   SpO2 100%   BMI 19.64 kg/m  ECOG: 1  Physical Exam Constitutional:      General: He is not in acute distress.    Appearance: He is not diaphoretic.  HENT:     Head: Normocephalic and atraumatic.  Eyes:     General: No scleral icterus.       Right eye: No discharge.        Left eye: No discharge.     Conjunctiva/sclera: Conjunctivae normal.  Cardiovascular:     Rate and Rhythm: Normal rate and regular rhythm.     Heart sounds: Normal heart sounds. No murmur heard.  No friction rub. No gallop.   Pulmonary:     Effort: Pulmonary effort is normal. No respiratory distress.     Breath sounds: Normal breath sounds. No wheezing or rales.  Abdominal:     General: Bowel sounds are normal.     Palpations: Abdomen is soft.  Musculoskeletal:     Right lower leg: Edema present.     Left lower leg: Edema present.     Comments: Trace bilateral lower extremity edema.  Skin:     General: Skin is warm and dry.     Findings: No erythema or rash.  Neurological:     Coordination: Coordination normal.     Gait: Gait normal.     Lab Review:     Component Value Date/Time   NA 135 02/23/2020 1149   K 3.9 02/23/2020 1149   CL 107 02/23/2020 1149   CO2 19 (L) 02/23/2020 1149   GLUCOSE 106 (H) 02/23/2020 1149   BUN 39 (H) 02/23/2020 1149   CREATININE 1.82 (H) 02/23/2020 1149   CALCIUM 9.5 02/23/2020 1149   PROT 6.2 (L) 02/23/2020 1149   ALBUMIN 3.0 (L) 02/23/2020 1149   AST 50 (H) 02/23/2020 1149   ALT 23 02/23/2020 1149   ALKPHOS 283 (H) 02/23/2020 1149   BILITOT 0.2 (L) 02/23/2020 1149   GFRNONAA 38 (L) 02/23/2020 1149   GFRAA >60 01/11/2020 1505       Component Value Date/Time   WBC 6.5 02/23/2020 1149   WBC 3.4 (L) 11/10/2019 2140   RBC 3.35 (L) 02/23/2020 1149   HGB 8.4 (L) 02/23/2020 1149   HCT 27.6 (L) 02/23/2020 1149   PLT 283 02/23/2020 1149   MCV 82.4 02/23/2020 1149   MCH 25.1 (L) 02/23/2020 1149   MCHC 30.4 02/23/2020 1149   RDW 20.9 (H) 02/23/2020 1149   LYMPHSABS 0.5 (L) 02/23/2020 1149   MONOABS 0.7 02/23/2020 1149   EOSABS 1.3 (H) 02/23/2020 1149   BASOSABS 0.1 02/23/2020 1149   -------------------------------  Imaging from last 24 hours (if applicable):  Radiology interpretation: No results found.      This patient was seen with Dr. Alen Blew with my treatment plan reviewed with him. He expressed agreement with my medical management of this patient.   Oncology addendum:  Patient seen and examined personally.  Note by Venetia Maxon, PA-C.  John Holt feels overall quite fatigued and slightly debilitated with recent chemotherapy.  He denies any shortness of breath or difficulty breathing.  Bone pain is actually improved since starting chemotherapy.  On physical examination he was awake alert.  No distress noted but overall lethargic.  Vitals were reviewed and laboratory data shows mild anemia hemoglobin of 8.4 with stable chronic  renal insufficiency.  His alkaline phosphatase showed slight decline which could indicate possible improvement in his disease status.  At this time, I have recommended withholding chemotherapy at this time we will restart in 2 weeks with dose reduction pending his improvement.  This was discussed with the patient and his wife today and they are in agreement.   30  minutes were dedicated to this visit. The time was spent on reviewing laboratory data, discussing treatment options,  and answering questions regarding future plan.

## 2020-03-01 ENCOUNTER — Telehealth: Payer: Self-pay

## 2020-03-01 ENCOUNTER — Other Ambulatory Visit: Payer: Self-pay

## 2020-03-01 ENCOUNTER — Other Ambulatory Visit: Payer: Self-pay | Admitting: Oncology

## 2020-03-01 DIAGNOSIS — C674 Malignant neoplasm of posterior wall of bladder: Secondary | ICD-10-CM

## 2020-03-01 NOTE — Telephone Encounter (Addendum)
Spoke with patient's wife. Per Dr Alen Blew, patient will have labs drawn at 1pm and be seen in [facility] tomorrow at 130pm. Per Dr Alen Blew, will get a CT for patient before next visit with him. Patient's wife verbalized understanding of visit tomorrow and getting CT. Patient's wife states if patient gets worst or she is unable to handle him before tomorrow she will take him to the ER.

## 2020-03-01 NOTE — Telephone Encounter (Signed)
-----   Message from Wyatt Portela, MD sent at 03/01/2020 11:11 AM EDT ----- He can use pain medication for pain. I will arrange for CT scan before his next visit.  He can be seen at [facility] tomorrow. Thanks.  ----- Message ----- From: Kennedy Bucker, LPN Sent: 12/27/9800  10:58 AM EDT To: Wyatt Portela, MD  Patient's wife called with concerns of changes in patient. States he is very weak. When he walks he is holding on to the wall while walking. He is having more confusion. Patient wakes up in the morning yelling out in pain. States that he hurts all over.  Kim LPN

## 2020-03-02 ENCOUNTER — Inpatient Hospital Stay: Payer: Medicare PPO | Attending: Oncology | Admitting: Medical

## 2020-03-02 ENCOUNTER — Other Ambulatory Visit: Payer: Self-pay | Admitting: Medical

## 2020-03-02 ENCOUNTER — Inpatient Hospital Stay: Payer: Medicare PPO

## 2020-03-02 ENCOUNTER — Other Ambulatory Visit: Payer: Self-pay

## 2020-03-02 VITALS — BP 133/80 | HR 61 | Temp 97.4°F | Resp 18 | Ht 68.0 in | Wt 132.2 lb

## 2020-03-02 DIAGNOSIS — C679 Malignant neoplasm of bladder, unspecified: Secondary | ICD-10-CM | POA: Diagnosis not present

## 2020-03-02 DIAGNOSIS — C674 Malignant neoplasm of posterior wall of bladder: Secondary | ICD-10-CM | POA: Insufficient documentation

## 2020-03-02 DIAGNOSIS — R102 Pelvic and perineal pain: Secondary | ICD-10-CM | POA: Diagnosis not present

## 2020-03-02 DIAGNOSIS — R109 Unspecified abdominal pain: Secondary | ICD-10-CM | POA: Diagnosis not present

## 2020-03-02 LAB — CBC WITH DIFFERENTIAL (CANCER CENTER ONLY)
Abs Immature Granulocytes: 0.03 10*3/uL (ref 0.00–0.07)
Basophils Absolute: 0.1 10*3/uL (ref 0.0–0.1)
Basophils Relative: 1 %
Eosinophils Absolute: 1.1 10*3/uL — ABNORMAL HIGH (ref 0.0–0.5)
Eosinophils Relative: 18 %
HCT: 32.5 % — ABNORMAL LOW (ref 39.0–52.0)
Hemoglobin: 9.9 g/dL — ABNORMAL LOW (ref 13.0–17.0)
Immature Granulocytes: 1 %
Lymphocytes Relative: 8 %
Lymphs Abs: 0.5 10*3/uL — ABNORMAL LOW (ref 0.7–4.0)
MCH: 24.6 pg — ABNORMAL LOW (ref 26.0–34.0)
MCHC: 30.5 g/dL (ref 30.0–36.0)
MCV: 80.6 fL (ref 80.0–100.0)
Monocytes Absolute: 0.7 10*3/uL (ref 0.1–1.0)
Monocytes Relative: 12 %
Neutro Abs: 3.8 10*3/uL (ref 1.7–7.7)
Neutrophils Relative %: 60 %
Platelet Count: 270 10*3/uL (ref 150–400)
RBC: 4.03 MIL/uL — ABNORMAL LOW (ref 4.22–5.81)
RDW: 20.9 % — ABNORMAL HIGH (ref 11.5–15.5)
WBC Count: 6.2 10*3/uL (ref 4.0–10.5)
nRBC: 0 % (ref 0.0–0.2)

## 2020-03-02 LAB — URINALYSIS, COMPLETE (UACMP) WITH MICROSCOPIC
Bilirubin Urine: NEGATIVE
Glucose, UA: NEGATIVE mg/dL
Ketones, ur: NEGATIVE mg/dL
Nitrite: POSITIVE — AB
Protein, ur: 100 mg/dL — AB
RBC / HPF: >50 RBC/hpf — ABNORMAL HIGH (ref 0–5)
Specific Gravity, Urine: 1.01 (ref 1.005–1.030)
WBC, UA: >50 WBC/hpf — ABNORMAL HIGH (ref 0–5)
pH: 6 (ref 5.0–8.0)

## 2020-03-02 LAB — CMP (CANCER CENTER ONLY)
ALT: 29 U/L (ref 0–44)
AST: 56 U/L — ABNORMAL HIGH (ref 15–41)
Albumin: 3.1 g/dL — ABNORMAL LOW (ref 3.5–5.0)
Alkaline Phosphatase: 270 U/L — ABNORMAL HIGH (ref 38–126)
Anion gap: 8 (ref 5–15)
BUN: 46 mg/dL — ABNORMAL HIGH (ref 8–23)
CO2: 20 mmol/L — ABNORMAL LOW (ref 22–32)
Calcium: 9.9 mg/dL (ref 8.9–10.3)
Chloride: 108 mmol/L (ref 98–111)
Creatinine: 2.06 mg/dL — ABNORMAL HIGH (ref 0.61–1.24)
GFR, Estimated: 33 mL/min — ABNORMAL LOW (ref >60)
Glucose, Bld: 82 mg/dL (ref 70–99)
Potassium: 4.3 mmol/L (ref 3.5–5.1)
Sodium: 136 mmol/L (ref 135–145)
Total Bilirubin: 0.2 mg/dL — ABNORMAL LOW (ref 0.3–1.2)
Total Protein: 6.7 g/dL (ref 6.5–8.1)

## 2020-03-02 LAB — SAMPLE TO BLOOD BANK

## 2020-03-02 MED ORDER — CIPROFLOXACIN HCL 250 MG PO TABS: 250.0000 mg | ORAL_TABLET | Freq: Two times a day (BID) | ORAL | 0 refills | Status: DC

## 2020-03-02 MED ORDER — TRAMADOL HCL 50 MG PO TABS: 50.0000 mg | ORAL_TABLET | Freq: Four times a day (QID) | ORAL | 0 refills | Status: DC | PRN

## 2020-03-05 LAB — URINE CULTURE: Culture: >=100000 — AB

## 2020-03-05 NOTE — Progress Notes (Addendum)
[facility] Progress Note   John Holt [id number] [birth year] 74 y.o.  John Holt is managed by Dr. Zola Button  Actively treated with chemotherapy/immunotherapy/hormonal therapy: yes  Current therapy: Padcev  Last treated: 02/09/2020 (cycle #1, day #8)  Next scheduled appointment with provider: March 25, 2020  Assessment: Plan:    Malignant neoplasm of urinary bladder, unspecified site (HCC)  Flank pain - Plan: ciprofloxacin (CIPRO) 250 MG tablet, traMADol (ULTRAM) 50 MG tablet  Suprapubic pain - Plan: ciprofloxacin (CIPRO) 250 MG tablet, traMADol (ULTRAM) 50 MG tablet   Malignant neoplasm of the urinary bladder with fatigue and myalgias: John Holt continues to be managed by Dr. Alen Blew and is status post cycle 1, day 8 of Padcev which was dosed on 02/09/2020.  Dr. Alen Blew again held treatment today. John Holt presented with his wife today. Dr. Alen Blew has ordered a restaging CT scan. Her will next be seen by Dr. Alen Blew on March 25, 2020. He previously discussed a possible dose reduction in order to lessen side effects.  Flank and suprapubic pressure and pain: The patient was placed on Cipro and Tramadol.  Please see After Visit Summary for patient specific instructions.  Future Appointments  Date Time Provider [place]  03-25-20  9:45 AM [facility] LAB [facility] None  03/25/2020 10:00 AM [facility] FLUSH [facility] None  2020/03/25 10:30 AM Wyatt Portela, MD [facility] None  2020-03-25 11:45 AM [facility] INFUSION [facility] None  03/10/2020  8:30 AM [facility] INFUSION [facility] None  03/23/2020 11:30 AM [facility] LAB [facility] None  03/23/2020 12:30 PM [facility] INFUSION [facility] None  09/20/2020 11:30 AM Cameron Sprang, MD [facility] None    No orders of the defined types were placed in this encounter.      Subjective:   Patient ID:  John Holt is a 74 y.o. (DOB [birth year]) male.  Chief Complaint:  No chief  complaint on file.   HPI John Holt  is a 74 y.o. male with a diagnosis of a clinic stage IV bladder cancer with hepatic metastasis and bone involvement documented in April 2021.  He was initially diagnosed in 2018 with localized disease.  John Holt is status post neoadjuvant chemotherapy which was given in preparation for a radical cystectomy.  He was treated with MVAC for 4 cycles under the care of Dr. Marcello Moores at [facility].  He subsequently declined a cystectomy for personal reasons.  He remained without evidence of disease until January 2020 when he had a recurrence of a nonmuscle invasive disease.  He was begun on pembrolizumab in March 2020.  A repeat TURBT from April 2020 showed a persistent high-grade noninvasive papillary urothelial carcinoma.  He was treated with BCG in May 2020 under the care of Dr. Alyson Ingles.  He had a repeat TURBT completed in August 2020 which showed muscle invasive disease without any evidence of metastatic disease based on imaging studies in June 2020.  He agreed to proceed with a radical cystectomy on October 30 of 2020 and was noted to have T3a N0 disease. He was treated with gemcitabine and carboplatin which was begun on August 12, 2019 with his sixth cycle completed on November 25, 2019.  He is currently being treated with Padcev at 1 mg/kg every other week with his first treatment dosed on January 05, 2020.    He is status post cycle 1, day 8 of therapy which was dosed on 02/09/2020.  He presents to the clinic today having last been seen on  02/23/2020 with fatigue, lower back pain, dyspnea on exertion, bilateral lower extremity muscle cramps especially at night, mild bilateral lower extremity edema, increased somnolence with difficulty staying awake, and some slowing of his thought processes. He presents today with his wife. He continues to have fatigue and has pain in his left flank and in his suprapubic area. His wife reports that the urine in his  urostomy bag is cloudy and foul smellinf. John Holt is having chills and has been eating less. His wife reports that the patient is moaning throughout the night and day. His lower extremities continue to be edematous.   Medications: I have reviewed the patient's current medications.  Allergies: No Known Allergies  Past Medical History:  Diagnosis Date  . Anxiety disorder   . Bladder cancer [facility]) urologist-- dr Alyson Ingles ([facility]) and dr Lawerance Bach [facility]):  oncologist-  dr Alen Blew ([facility]) & dr Marcello Moores [facility])   dx 2018--- s/p  TURBT's ;  hx BCG tx's.  chemotherapy completed 2019, and immunotherapy Keytruda , last one 10-21-2018  . BPH (benign prostatic hypertrophy) with urinary obstruction   . History of adenomatous polyp of colon   . History of closed head injury    2000-- fell off ladder--  temporary memory loss resolved  . History of psychosis    x2  last one documented 2006  w/ hallucination  and suicide ideation  . Nocturia   . Port-A-Cath in place 2018  . S/P placement of VNS (vagus nerve stimulation) device 01/28/2018   per pt swipe magnet twice daily  . Sigmoid diverticulosis   . Sinus bradycardia seen on cardiac monitor    cardiology --  dr g. taylor;   event monitor results 11-15-2018 in epic,  NSR w/ ST and SB and a nocturnal pause   . Temporal lobe epilepsy syndrome [facility]) neurologist-  dr Raliegh Ip. Delice Lesch-  avergae one every 2 weeks "legs jerking around, per wife , pt unaware he's doing this"   localization-related right temporal lobe --  mostly nocturnal w/ bilateral lower extremitity movement, staring and moaning then dissorietation afterwards (12-02-2018 per pt last daytime seizure 11-11-2018 and last nighttime seizure 11-29-2018  . Vesicoureteral reflux, bilateral   . Wears glasses     Past Surgical History:  Procedure Laterality Date  . COLONOSCOPY    . COLONOSCOPY W/ POLYPECTOMY  09-11-2009  . CYSTOSCOPY W/ RETROGRADES Bilateral 08/13/2018   Procedure:  CYSTOSCOPY WITH RETROGRADE PYELOGRAM;  Surgeon: Cleon Gustin, MD;  Location: [facility];  Service: Urology;  Laterality: Bilateral;  . CYSTOSCOPY WITH INJECTION N/A 02/25/2019   Procedure: CYSTOSCOPY WITH INJECTION;  Surgeon: Alexis Frock, MD;  Location: [facility];  Service: Urology;  Laterality: N/A;  . GREEN LIGHT LASER TURP (TRANSURETHRAL RESECTION OF PROSTATE N/A 04/07/2014   Procedure: GREEN LIGHT LASER TURP (TRANSURETHRAL RESECTION OF PROSTATE;  Surgeon: Ailene Rud, MD;  [facility];  Service: Urology;  Laterality: N/A;  . INGUINAL HERNIA REPAIR Bilateral 04/15/2013   Procedure: LAPAROSCOPIC BILATERAL INGUINAL HERNIA REPAIR;  Surgeon: Gayland Curry, MD;  Location: [facility];  Service: General;  Laterality: Bilateral;  . INSERTION OF MESH N/A 04/15/2013   Procedure: INSERTION OF MESH;  Surgeon: Gayland Curry, MD;  Location: [facility];  Service: General;  Laterality: N/A;  . PORTACATH PLACEMENT  2018  . TRANSURETHRAL RESECTION OF BLADDER  01-27-2017;  05-26-2017;  05-25-2018  both by dr Chriss Czar davis @[email or web address]   . [place] TUMOR N/A 08/13/2018  Procedure: TRANSURETHRAL RESECTION OF BLADDER TUMOR (TURBT);  Surgeon: Cleon Gustin, MD;  Location: [facility];  Service: Urology;  Laterality: N/A;  1 HR  . TRANSURETHRAL RESECTION OF BLADDER TUMOR N/A 12/03/2018   Procedure: TRANSURETHRAL RESECTION OF BLADDER TUMOR (TURBT);  Surgeon: Cleon Gustin, MD;  Location: [facility];  Service: Urology;  Laterality: N/A;  . UMBILICAL HERNIA REPAIR N/A 04/15/2013   Procedure: OPEN HERNIA REPAIR UMBILICAL ADULT;  Surgeon: Gayland Curry, MD;  Location: [facility];  Service: General;  Laterality: N/A;  . VAGUS NERVE STIMULATOR INSERTION Left 01/28/2018   Procedure: VAGAL NERVE STIMULATOR PLACEMENT;  Surgeon: Consuella Lose, MD;  Location: [place];  Service: Neurosurgery;  Laterality: Left;  VAGAL NERVE STIMULATOR PLACEMENT    Family History    Problem Relation Age of Onset  . Seizures Father   . Heart disease Father   . Cancer Father        prostat  . Cancer Mother        stomach  . Stomach cancer Mother   . Colon cancer Neg Hx   . Colon polyps Neg Hx   . Esophageal cancer Neg Hx   . Rectal cancer Neg Hx     Social History   Socioeconomic History  . Marital status: Married    Spouse name: [name]  . Number of children: 2  . Years of education: PhD  . 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 Smoking status: Former Smoker    Packs/day: 1.00    Years: 15.00    Pack years: 15.00    Types: Cigarettes    Quit date: 04/03/1972    Years since quitting: 47.9  . Smokeless tobacco: Never Used  Vaping Use  . Vaping Use: Never used  Substance and Sexual Activity  . Alcohol use: Yes    Alcohol/week: 14.0 standard drinks    Types: 14 Glasses of wine per week    Comment: 1 or 2  wine daily; no consumption in last 24 hours  . Drug use: No  . Sexual activity: Yes    Partners: Female  Other Topics Concern  . Not on file  Social History Narrative   Pt lives at home with his wife. One story home.      Left handed      Phd in English      Caffeine Use- 2 cups daily   Social Determinants of Health   Financial Resource Strain:   . Difficulty of Paying Living Expenses: Not on file  Food Insecurity:   . Worried About Charity fundraiser in the Last Year: Not on file  . Ran Out of Food in the Last Year: Not on file  Transportation Needs:   . Lack of Transportation (Medical): Not on file  . Lack of Transportation (Non-Medical): Not on file  Physical Activity:   . Days of Exercise per Week: Not on file  . Minutes of Exercise per Session: Not on file  Stress:   . Feeling of Stress : Not on file  Social Connections:   . Frequency of Communication with Friends and Family: Not on file  . Frequency of Social  Gatherings with Friends and Family: Not on file  . Attends Religious Services: Not on file  . Active Member of Clubs or Organizations: Not on file  . Attends Archivist Meetings:  Not on file  . Marital Status: Not on file  Intimate Partner Violence:   . Fear of Current or Ex-Partner: Not on file  . Emotionally Abused: Not on file  . Physically Abused: Not on file  . Sexually Abused: Not on file    Past Medical History, Surgical history, Social history, and Family history were reviewed and updated as appropriate.   Please see review of systems for further details on the patient's review from today.   Review of Systems:  Review of Systems  Constitutional: Positive for fatigue. Negative for chills, diaphoresis and fever.  HENT: Negative for trouble swallowing and voice change.   Respiratory: Negative for cough, chest tightness, shortness of breath and wheezing.   Cardiovascular: Positive for leg swelling. Negative for chest pain and palpitations.  Gastrointestinal: Negative for abdominal pain, constipation, diarrhea, nausea and vomiting.  Genitourinary: Positive for flank pain.       Suprapubic pain  Musculoskeletal: Positive for back pain and myalgias.  Neurological: Negative for dizziness, light-headedness and headaches.    Objective:   Physical Exam:  BP 133/80   Pulse 61   Temp (!) 97.4 F (36.3 C) (Tympanic)   Resp 18   Ht 5\' 8"  (1.727 m)   Wt 132 lb 3.2 oz (60 kg)   SpO2 99%   BMI 20.10 kg/m  ECOG: 1  Physical Exam Constitutional:      General: He is not in acute distress.    Appearance: He is not diaphoretic.  HENT:     Head: Normocephalic and atraumatic.  Eyes:     General: No scleral icterus.       Right eye: No discharge.        Left eye: No discharge.     Conjunctiva/sclera: Conjunctivae normal.  Cardiovascular:     Rate and Rhythm: Normal rate.  Pulmonary:     Effort: No respiratory distress.  Musculoskeletal:     Right lower leg: Edema  present.     Left lower leg: Edema present.     Comments: Trace bilateral lower extremity edema.  Skin:    General: Skin is warm and dry.  Neurological:     Coordination: Coordination normal.     Gait: Gait abnormal (The patient is ambulating with a wheelchair.).     Lab Review:     Component Value Date/Time   NA 136 03/02/2020 1322   K 4.3 03/02/2020 1322   CL 108 03/02/2020 1322   CO2 20 (L) 03/02/2020 1322   GLUCOSE 82 03/02/2020 1322   BUN 46 (H) 03/02/2020 1322   CREATININE 2.06 (H) 03/02/2020 1322   CALCIUM 9.9 03/02/2020 1322   PROT 6.7 03/02/2020 1322   ALBUMIN 3.1 (L) 03/02/2020 1322   AST 56 (H) 03/02/2020 1322   ALT 29 03/02/2020 1322   ALKPHOS 270 (H) 03/02/2020 1322   BILITOT 0.2 (L) 03/02/2020 1322   GFRNONAA 33 (L) 03/02/2020 1322   GFRAA >60 01/11/2020 1505       Component Value Date/Time   WBC 6.2 03/02/2020 1322   WBC 3.4 (L) 11/10/2019 2140   RBC 4.03 (L) 03/02/2020 1322   HGB 9.9 (L) 03/02/2020 1322   HCT 32.5 (L) 03/02/2020 1322   PLT 270 03/02/2020 1322   MCV 80.6 03/02/2020 1322   MCH 24.6 (L) 03/02/2020 1322   MCHC 30.5 03/02/2020 1322   RDW 20.9 (H) 03/02/2020 1322   LYMPHSABS 0.5 (L) 03/02/2020 1322   MONOABS 0.7 03/02/2020 1322   EOSABS 1.1 (  H) 03/02/2020 1322   BASOSABS 0.1 03/02/2020 1322   -------------------------------  Imaging from last 24 hours (if applicable):  Radiology interpretation: No results found.      This patient was seen with Dr. Alen Blew with my treatment plan reviewed with him. He expressed agreement with my medical management of this patient.   Oncology addendum:  Patient seen and examined personally.  Note by Venetia Maxon, PA-C.  Mr. Drost feels overall quite fatigued and slightly debilitated with recent chemotherapy.  He denies any shortness of breath or difficulty breathing.  Bone pain is actually improved since starting chemotherapy.  On physical examination he was awake alert.  No distress noted but  overall lethargic.  Vitals were reviewed and laboratory data shows mild anemia hemoglobin of 8.4 with stable chronic renal insufficiency.  His alkaline phosphatase showed slight decline which could indicate possible improvement in his disease status.  At this time, I have recommended withholding chemotherapy at this time we will restart in 2 weeks with dose reduction pending his improvement.  This was discussed with the patient and his wife today and they are in agreement.   30  minutes were dedicated to this visit. The time was spent on reviewing laboratory data, discussing treatment options,  and answering questions regarding future plan.

## 2020-03-06 ENCOUNTER — Other Ambulatory Visit: Payer: Self-pay

## 2020-03-06 ENCOUNTER — Emergency Department (HOSPITAL_COMMUNITY): Payer: Medicare PPO

## 2020-03-06 ENCOUNTER — Inpatient Hospital Stay (HOSPITAL_COMMUNITY)
Admission: EM | Admit: 2020-03-06 | Discharge: 2020-03-08 | DRG: 054 | Disposition: A | Discharge status: Hospice | Payer: Medicare PPO | Attending: Internal Medicine | Admitting: Internal Medicine

## 2020-03-06 ENCOUNTER — Telehealth: Payer: Self-pay

## 2020-03-06 ENCOUNTER — Encounter (HOSPITAL_COMMUNITY): Payer: Self-pay | Admitting: *Deleted

## 2020-03-06 DIAGNOSIS — Z87891 Personal history of nicotine dependence: Secondary | ICD-10-CM | POA: Diagnosis not present

## 2020-03-06 DIAGNOSIS — G40909 Epilepsy, unspecified, not intractable, without status epilepticus: Secondary | ICD-10-CM | POA: Diagnosis present

## 2020-03-06 DIAGNOSIS — R001 Bradycardia, unspecified: Secondary | ICD-10-CM | POA: Diagnosis not present

## 2020-03-06 DIAGNOSIS — E869 Volume depletion, unspecified: Secondary | ICD-10-CM | POA: Diagnosis present

## 2020-03-06 DIAGNOSIS — C679 Malignant neoplasm of bladder, unspecified: Secondary | ICD-10-CM | POA: Diagnosis present

## 2020-03-06 DIAGNOSIS — N390 Urinary tract infection, site not specified: Secondary | ICD-10-CM | POA: Diagnosis present

## 2020-03-06 DIAGNOSIS — N401 Enlarged prostate with lower urinary tract symptoms: Secondary | ICD-10-CM | POA: Diagnosis present

## 2020-03-06 DIAGNOSIS — C7951 Secondary malignant neoplasm of bone: Secondary | ICD-10-CM | POA: Diagnosis present

## 2020-03-06 DIAGNOSIS — Z79899 Other long term (current) drug therapy: Secondary | ICD-10-CM | POA: Diagnosis not present

## 2020-03-06 DIAGNOSIS — G9341 Metabolic encephalopathy: Secondary | ICD-10-CM | POA: Diagnosis present

## 2020-03-06 DIAGNOSIS — R319 Hematuria, unspecified: Secondary | ICD-10-CM | POA: Diagnosis present

## 2020-03-06 DIAGNOSIS — R410 Disorientation, unspecified: Secondary | ICD-10-CM | POA: Diagnosis not present

## 2020-03-06 DIAGNOSIS — Z66 Do not resuscitate: Secondary | ICD-10-CM | POA: Diagnosis present

## 2020-03-06 DIAGNOSIS — Z888 Allergy status to other drugs, medicaments and biological substances status: Secondary | ICD-10-CM

## 2020-03-06 DIAGNOSIS — D849 Immunodeficiency, unspecified: Secondary | ICD-10-CM | POA: Diagnosis present

## 2020-03-06 DIAGNOSIS — C7931 Secondary malignant neoplasm of brain: Principal | ICD-10-CM

## 2020-03-06 DIAGNOSIS — K449 Diaphragmatic hernia without obstruction or gangrene: Secondary | ICD-10-CM | POA: Diagnosis not present

## 2020-03-06 DIAGNOSIS — G936 Cerebral edema: Secondary | ICD-10-CM | POA: Diagnosis present

## 2020-03-06 DIAGNOSIS — C787 Secondary malignant neoplasm of liver and intrahepatic bile duct: Secondary | ICD-10-CM | POA: Diagnosis present

## 2020-03-06 DIAGNOSIS — R4182 Altered mental status, unspecified: Secondary | ICD-10-CM | POA: Diagnosis present

## 2020-03-06 DIAGNOSIS — E872 Acidosis: Secondary | ICD-10-CM | POA: Diagnosis present

## 2020-03-06 DIAGNOSIS — F419 Anxiety disorder, unspecified: Secondary | ICD-10-CM | POA: Diagnosis present

## 2020-03-06 DIAGNOSIS — A419 Sepsis, unspecified organism: Secondary | ICD-10-CM | POA: Diagnosis not present

## 2020-03-06 DIAGNOSIS — N1330 Unspecified hydronephrosis: Secondary | ICD-10-CM | POA: Diagnosis not present

## 2020-03-06 DIAGNOSIS — N179 Acute kidney failure, unspecified: Secondary | ICD-10-CM | POA: Diagnosis present

## 2020-03-06 DIAGNOSIS — D630 Anemia in neoplastic disease: Secondary | ICD-10-CM | POA: Diagnosis present

## 2020-03-06 DIAGNOSIS — Z86010 Personal history of colonic polyps: Secondary | ICD-10-CM | POA: Diagnosis not present

## 2020-03-06 DIAGNOSIS — Z515 Encounter for palliative care: Secondary | ICD-10-CM

## 2020-03-06 DIAGNOSIS — J9811 Atelectasis: Secondary | ICD-10-CM | POA: Diagnosis not present

## 2020-03-06 DIAGNOSIS — Z20822 Contact with and (suspected) exposure to covid-19: Secondary | ICD-10-CM | POA: Diagnosis present

## 2020-03-06 DIAGNOSIS — E871 Hypo-osmolality and hyponatremia: Secondary | ICD-10-CM | POA: Diagnosis present

## 2020-03-06 DIAGNOSIS — R627 Adult failure to thrive: Secondary | ICD-10-CM | POA: Diagnosis present

## 2020-03-06 DIAGNOSIS — G9389 Other specified disorders of brain: Secondary | ICD-10-CM | POA: Diagnosis not present

## 2020-03-06 DIAGNOSIS — Z79890 Hormone replacement therapy (postmenopausal): Secondary | ICD-10-CM

## 2020-03-06 LAB — URINALYSIS, ROUTINE W REFLEX MICROSCOPIC
Bacteria, UA: NONE SEEN
Bilirubin Urine: NEGATIVE
Glucose, UA: NEGATIVE mg/dL
Ketones, ur: NEGATIVE mg/dL
Nitrite: NEGATIVE
Protein, ur: 30 mg/dL — AB
RBC / HPF: >50 RBC/hpf — ABNORMAL HIGH (ref 0–5)
Specific Gravity, Urine: 1.015 (ref 1.005–1.030)
WBC, UA: >50 WBC/hpf — ABNORMAL HIGH (ref 0–5)
pH: 6 (ref 5.0–8.0)

## 2020-03-06 LAB — LACTIC ACID, PLASMA
Lactic Acid, Venous: 2 mmol/L (ref 0.5–1.9)
Lactic Acid, Venous: 2.6 mmol/L (ref 0.5–1.9)

## 2020-03-06 LAB — CBC WITH DIFFERENTIAL/PLATELET
Abs Immature Granulocytes: 0.05 10*3/uL (ref 0.00–0.07)
Basophils Absolute: 0 10*3/uL (ref 0.0–0.1)
Basophils Relative: 1 %
Eosinophils Absolute: 0.5 10*3/uL (ref 0.0–0.5)
Eosinophils Relative: 8 %
HCT: 29.9 % — ABNORMAL LOW (ref 39.0–52.0)
Hemoglobin: 9 g/dL — ABNORMAL LOW (ref 13.0–17.0)
Immature Granulocytes: 1 %
Lymphocytes Relative: 7 %
Lymphs Abs: 0.4 10*3/uL — ABNORMAL LOW (ref 0.7–4.0)
MCH: 24.7 pg — ABNORMAL LOW (ref 26.0–34.0)
MCHC: 30.1 g/dL (ref 30.0–36.0)
MCV: 82.1 fL (ref 80.0–100.0)
Monocytes Absolute: 0.8 10*3/uL (ref 0.1–1.0)
Monocytes Relative: 12 %
Neutro Abs: 4.5 10*3/uL (ref 1.7–7.7)
Neutrophils Relative %: 71 %
Platelets: 196 10*3/uL (ref 150–400)
RBC: 3.64 MIL/uL — ABNORMAL LOW (ref 4.22–5.81)
RDW: 21.2 % — ABNORMAL HIGH (ref 11.5–15.5)
WBC: 6.3 10*3/uL (ref 4.0–10.5)
nRBC: 0 % (ref 0.0–0.2)

## 2020-03-06 LAB — COMPREHENSIVE METABOLIC PANEL
ALT: 27 U/L (ref 0–44)
AST: 66 U/L — ABNORMAL HIGH (ref 15–41)
Albumin: 3.1 g/dL — ABNORMAL LOW (ref 3.5–5.0)
Alkaline Phosphatase: 246 U/L — ABNORMAL HIGH (ref 38–126)
Anion gap: 7 (ref 5–15)
BUN: 46 mg/dL — ABNORMAL HIGH (ref 8–23)
CO2: 21 mmol/L — ABNORMAL LOW (ref 22–32)
Calcium: 10.5 mg/dL — ABNORMAL HIGH (ref 8.9–10.3)
Chloride: 106 mmol/L (ref 98–111)
Creatinine, Ser: 1.82 mg/dL — ABNORMAL HIGH (ref 0.61–1.24)
GFR, Estimated: 38 mL/min — ABNORMAL LOW (ref >60)
Glucose, Bld: 88 mg/dL (ref 70–99)
Potassium: 4.2 mmol/L (ref 3.5–5.1)
Sodium: 134 mmol/L — ABNORMAL LOW (ref 135–145)
Total Bilirubin: 0.4 mg/dL (ref 0.3–1.2)
Total Protein: 6.6 g/dL (ref 6.5–8.1)

## 2020-03-06 LAB — PROTIME-INR
INR: 1.2 (ref 0.8–1.2)
Prothrombin Time: 14.3 seconds (ref 11.4–15.2)

## 2020-03-06 LAB — RESPIRATORY PANEL BY RT PCR (FLU A&B, COVID)
Influenza A by PCR: NEGATIVE
Influenza B by PCR: NEGATIVE
SARS Coronavirus 2 by RT PCR: NEGATIVE

## 2020-03-06 LAB — AMMONIA: Ammonia: 31 umol/L (ref 9–35)

## 2020-03-06 LAB — APTT: aPTT: 35 seconds (ref 24–36)

## 2020-03-06 MED ORDER — DEXAMETHASONE SODIUM PHOSPHATE 4 MG/ML IJ SOLN
4.0000 mg | Freq: Four times a day (QID) | INTRAMUSCULAR | Status: DC
  Administered 2020-03-06 – 2020-03-08 (×8): 4 mg via INTRAVENOUS
  Filled 2020-03-06 (×8): qty 1

## 2020-03-06 MED ORDER — SODIUM CHLORIDE 0.9% FLUSH: 10.0000 mL | INTRAVENOUS | Status: DC | PRN

## 2020-03-06 MED ORDER — SODIUM CHLORIDE 0.9 % IV SOLN
2.0000 g | Freq: Once | INTRAVENOUS | Status: AC
  Administered 2020-03-06: 2 g via INTRAVENOUS
  Filled 2020-03-06: qty 20

## 2020-03-06 MED ORDER — LEVOTHYROXINE SODIUM 25 MCG PO TABS
137.0000 ug | ORAL_TABLET | Freq: Every day | ORAL | Status: DC
  Administered 2020-03-07: 07:00:00 137 ug via ORAL
  Filled 2020-03-06: qty 1

## 2020-03-06 MED ORDER — PROCHLORPERAZINE MALEATE 10 MG PO TABS
10.0000 mg | ORAL_TABLET | Freq: Four times a day (QID) | ORAL | Status: DC | PRN
  Administered 2020-03-06: 10 mg via ORAL
  Filled 2020-03-06 (×2): qty 1

## 2020-03-06 MED ORDER — DEXAMETHASONE SODIUM PHOSPHATE 10 MG/ML IJ SOLN
10.0000 mg | Freq: Once | INTRAMUSCULAR | Status: AC
  Administered 2020-03-06: 10 mg via INTRAVENOUS
  Filled 2020-03-06: qty 1

## 2020-03-06 MED ORDER — ZONISAMIDE 100 MG PO CAPS
300.0000 mg | ORAL_CAPSULE | Freq: Every day | ORAL | Status: DC
  Administered 2020-03-06: 300 mg via ORAL
  Filled 2020-03-06: qty 3

## 2020-03-06 MED ORDER — LACTATED RINGERS IV BOLUS (SEPSIS)
1000.0000 mL | Freq: Once | INTRAVENOUS | Status: AC
  Administered 2020-03-06: 1000 mL via INTRAVENOUS

## 2020-03-06 MED ORDER — FENTANYL CITRATE (PF) 100 MCG/2ML IJ SOLN
50.0000 ug | Freq: Once | INTRAMUSCULAR | Status: AC
  Administered 2020-03-06: 50 ug via INTRAVENOUS
  Filled 2020-03-06: qty 2

## 2020-03-06 MED ORDER — FENTANYL CITRATE (PF) 100 MCG/2ML IJ SOLN
25.0000 ug | INTRAMUSCULAR | Status: DC | PRN
  Administered 2020-03-06 – 2020-03-07 (×6): 25 ug via INTRAVENOUS
  Filled 2020-03-06 (×6): qty 2

## 2020-03-06 MED ORDER — FENTANYL CITRATE (PF) 100 MCG/2ML IJ SOLN: 12.5000 ug | INTRAMUSCULAR | Status: DC | PRN

## 2020-03-06 MED ORDER — CLOBAZAM 10 MG PO TABS
10.0000 mg | ORAL_TABLET | Freq: Every day | ORAL | Status: DC
  Administered 2020-03-06: 23:00:00 10 mg via ORAL
  Filled 2020-03-06: qty 1

## 2020-03-06 MED ORDER — LORAZEPAM 2 MG/ML IJ SOLN: 1.0000 mg | Freq: Four times a day (QID) | INTRAMUSCULAR | Status: DC | PRN

## 2020-03-06 MED ORDER — IOHEXOL 300 MG/ML  SOLN
80.0000 mL | Freq: Once | INTRAMUSCULAR | Status: AC | PRN
  Administered 2020-03-06: 80 mL via INTRAVENOUS

## 2020-03-06 MED ORDER — MORPHINE SULFATE (PF) 2 MG/ML IV SOLN
1.0000 mg | INTRAVENOUS | Status: AC | PRN
  Administered 2020-03-07: 1 mg via INTRAVENOUS
  Filled 2020-03-06: qty 1

## 2020-03-06 MED ORDER — SODIUM CHLORIDE 0.9 % IV SOLN: Freq: Once | INTRAVENOUS | Status: AC

## 2020-03-06 MED ORDER — SODIUM CHLORIDE 0.9 % IV SOLN: 1.0000 g | INTRAVENOUS | Status: DC

## 2020-03-06 NOTE — ED Provider Notes (Addendum)
[facility] DEPT Provider Note   CSN: [id number] Arrival date & time: 03/06/20  1005     History Chief Complaint  Patient presents with  . Bladder Cancer  . Urinary Tract Infection    John Holt is a 74 y.o. male.  HPI Level 5 caveat secondary to patient weakness History from wife and chart review    74 year old male history of metastatic bladder cancer, currently on padcev, immunosuppressed with gradual increase in weakness and sleepiness became worse last THursday, improved after diagnosis of uti and tx with cipro, then became more lethargic again.  Wife brings him in secondary to increasing lethargy and in consultation with his oncology clinic.  Past Medical History:  Diagnosis Date  . Anxiety disorder   . Bladder cancer [facility]) urologist-- dr Alyson Ingles ([facility]) and dr Lawerance [facility]rology):  oncologist-  dr Alen Blew ([facility]) & dr Marcello Moores [facility])   dx 2018--- s/p  TURBT's ;  hx BCG tx's.  chemotherapy completed 2019, and immunotherapy Keytruda , last one 10-21-2018  . BPH (benign prostatic hypertrophy) with urinary obstruction   . History of adenomatous polyp of colon   . History of closed head injury    2000-- fell off ladder--  temporary memory loss resolved  . History of psychosis    x2  last one documented 2006  w/ hallucination  and suicide ideation  . Nocturia   . Port-A-Cath in place 2018  . S/P placement of VNS (vagus nerve stimulation) device 01/28/2018   per pt swipe magnet twice daily  . Sigmoid diverticulosis   . Sinus bradycardia seen on cardiac monitor    cardiology --  dr g. taylor;   event monitor results 11-15-2018 in epic,  NSR w/ ST and SB and a nocturnal pause   . Temporal lobe epilepsy syndrome [facility]) neurologist-  dr Raliegh Ip. Delice Lesch-  avergae one every 2 weeks "legs jerking around, per wife , pt unaware he's doing this"   localization-related right temporal lobe --  mostly nocturnal w/ bilateral lower extremitity  movement, staring and moaning then dissorietation afterwards (12-02-2018 per pt last daytime seizure 11-11-2018 and last nighttime seizure 11-29-2018  . Vesicoureteral reflux, bilateral   . Wears glasses     Patient Active Problem List   Diagnosis Date Noted  . Port-A-Cath in place 08/12/2019  . Goals of care, counseling/discussion 08/04/2019  . Pressure injury of skin 08/01/2019  . Seizure ([place]) 07/31/2019  . Status post ileal conduit ([place]) 02/25/2019  . Bladder cancer ([place]) 02/25/2019  . Sinus bradycardia 10/26/2018  . Malignant neoplasm of posterior wall of urinary bladder ([place]) 02/24/2017  . Benign prostatic hyperplasia with urinary obstruction 08/18/2016  . ED (erectile dysfunction) of organic origin 08/18/2016  . Focal epilepsy with impairment of consciousness, intractable ([place]) 04/30/2014  . Memory loss 04/30/2014  . Localization-related symptomatic epilepsy and epileptic syndromes with complex partial seizures, intractable, without status epilepticus ([place]) 08/11/2012    Past Surgical History:  Procedure Laterality Date  . COLONOSCOPY    . COLONOSCOPY W/ POLYPECTOMY  09-11-2009  . CYSTOSCOPY W/ RETROGRADES Bilateral 08/13/2018   Procedure: CYSTOSCOPY WITH RETROGRADE PYELOGRAM;  Surgeon: Cleon Gustin, MD;  Location: [facility];  Service: Urology;  Laterality: Bilateral;  . CYSTOSCOPY WITH INJECTION N/A 02/25/2019   Procedure: CYSTOSCOPY WITH INJECTION;  Surgeon: Alexis Frock, MD;  Location: [facility];  Service: Urology;  Laterality: N/A;  . GREEN LIGHT LASER TURP (TRANSURETHRAL RESECTION OF PROSTATE N/A 04/07/2014   Procedure: GREEN LIGHT  LASER TURP (TRANSURETHRAL RESECTION OF PROSTATE;  Surgeon: Ailene Rud, MD;  Location: [facility];  Service: Urology;  Laterality: N/A;  . INGUINAL HERNIA REPAIR Bilateral 04/15/2013   Procedure: LAPAROSCOPIC BILATERAL INGUINAL HERNIA REPAIR;  Surgeon: Gayland Curry, MD;  Location: [facility];  Service: General;   Laterality: Bilateral;  . INSERTION OF MESH N/A 04/15/2013   Procedure: INSERTION OF MESH;  Surgeon: Gayland Curry, MD;  Location: [facility];  Service: General;  Laterality: N/A;  . PORTACATH PLACEMENT  2018  . TRANSURETHRAL RESECTION OF BLADDER  01-27-2017;  05-26-2017;  05-25-2018  both by dr Chriss Czar davis @[facility]   . TRANSURETHRAL RESECTION OF BLADDER TUMOR N/A 08/13/2018   Procedure: TRANSURETHRAL RESECTION OF BLADDER TUMOR (TURBT);  Surgeon: Cleon Gustin, MD;  Location: [facility];  Service: Urology;  Laterality: N/A;  1 HR  . TRANSURETHRAL RESECTION OF BLADDER TUMOR N/A 12/03/2018   Procedure: TRANSURETHRAL RESECTION OF BLADDER TUMOR (TURBT);  Surgeon: Cleon Gustin, MD;  Location: [facility];  Service: Urology;  Laterality: N/A;  . UMBILICAL HERNIA REPAIR N/A 04/15/2013   Procedure: OPEN HERNIA REPAIR UMBILICAL ADULT;  Surgeon: Gayland Curry, MD;  Location: [facility];  Service: General;  Laterality: N/A;  . VAGUS NERVE STIMULATOR INSERTION Left 01/28/2018   Procedure: VAGAL NERVE STIMULATOR PLACEMENT;  Surgeon: Consuella Lose, MD;  Location: [place];  Service: Neurosurgery;  Laterality: Left;  VAGAL NERVE STIMULATOR PLACEMENT       Family History  Problem Relation Age of Onset  . Seizures Father   . Heart disease Father   . Cancer Father        prostat  . Cancer Mother        stomach  . Stomach cancer Mother   . Colon cancer Neg Hx   . Colon polyps Neg Hx   . Esophageal cancer Neg Hx   . Rectal cancer Neg Hx     Social History   Tobacco Use  . Smoking status: Former Smoker    Packs/day: 1.00    Years: 15.00    Pack years: 15.00    Types: Cigarettes    Quit date: 04/03/1972    Years since quitting: 47.9  . Smokeless tobacco: Never Used  Vaping Use  . Vaping Use: Never used  Substance Use Topics  . Alcohol use: Yes    Alcohol/week: 14.0 standard drinks    Types: 14 Glasses of wine per week    Comment: 1 or 2  wine daily; no consumption in last 24 hours    . Drug use: No    Home Medications Prior to Admission medications   Medication Sig Start Date End Date Taking? Authorizing Provider  furosemide (LASIX) 20 MG tablet TAKE 1 TABLET(20 MG) BY MOUTH DAILY Patient taking differently: Take 20 mg by mouth daily as needed.  02/09/20  Yes Wyatt Portela, MD  ciprofloxacin (CIPRO) 250 MG tablet Take 1 tablet (250 mg total) by mouth 2 (two) times daily. 03/02/20   Harle Stanford., PA-C  cloBAZam (ONFI) 10 MG tablet Take 1 tablet every night 02/15/20   Cameron Sprang, MD  clonazePAM (KLONOPIN) 0.5 MG tablet Take 1 tablet as needed for seizure cluster. Do not take more than 2-3 a week Patient taking differently: Take 0.5 mg by mouth daily as needed (for seizure cluster). Do not take more than 2-3 a week 09/05/19   Cameron Sprang, MD  ibuprofen (ADVIL) 200 MG tablet Take 400 mg  by mouth every 6 (six) hours as needed for moderate pain.    [provider]  levothyroxine (SYNTHROID) 137 MCG tablet Take 137 mcg by mouth every morning. 09/06/19   [provider]  Multiple Vitamin (MULTIVITAMIN WITH MINERALS) TABS tablet Take 1 tablet by mouth in the morning and at bedtime.     [provider]  polyethylene glycol (MIRALAX / GLYCOLAX) 17 g packet Take 17 g by mouth daily. Patient taking differently: Take 17 g by mouth daily as needed for mild constipation or moderate constipation.  07/29/19   Jacqlyn Larsen, PA-C  PREVIDENT 5000 BOOSTER PLUS 1.1 % PSTE  01/11/20   [provider]  prochlorperazine (COMPAZINE) 10 MG tablet Take 1 tablet (10 mg total) by mouth every 6 (six) hours as needed for nausea or vomiting. 08/04/19   Wyatt Portela, MD  senna (SENOKOT) 8.6 MG TABS tablet Take 2 tablets (17.2 mg total) by mouth 2 (two) times daily. 08/02/19   Micheline Rough, MD  sulfamethoxazole-trimethoprim (BACTRIM DS) 800-160 MG tablet Take 1 tablet by mouth 2 (two) times daily. 12/26/19   Wyatt Portela, MD  traMADol (ULTRAM) 50 MG tablet Take  1 tablet (50 mg total) by mouth every 6 (six) hours as needed. 03/02/20   Tanner, Lyndon Code., PA-C  zonisamide (ZONEGRAN) 100 MG capsule TAKE 3 CAPSULES EACH NIGHT 02/15/20   Cameron Sprang, MD    Allergies    Chlorhexidine and Betadine [povidone iodine]  Review of Systems   Review of Systems  All other systems reviewed and are negative.   Physical Exam Updated Vital Signs BP 140/75   Pulse 64   Temp (!) 95.7 F (35.4 C) (Rectal)   Resp 18   SpO2 100%   Physical Exam Vitals and nursing note reviewed.  Constitutional:      Appearance: He is ill-appearing.  HENT:     Head: Normocephalic.     Right Ear: External ear normal.     Left Ear: External ear normal.     Nose: Nose normal.     Mouth/Throat:     Mouth: Mucous membranes are dry.     Pharynx: Oropharynx is clear.  Eyes:     Extraocular Movements: Extraocular movements intact.     Pupils: Pupils are equal, round, and reactive to light.  Cardiovascular:     Rate and Rhythm: Normal rate and regular rhythm.     Pulses: Normal pulses.     Comments: Right chest wall with port Left chest wall with stimulator Pulmonary:     Effort: Pulmonary effort is normal.  Abdominal:     General: Abdomen is flat.     Palpations: Abdomen is soft.  Musculoskeletal:        General: No tenderness. Normal range of motion.     Cervical back: Normal range of motion.  Skin:    General: Skin is warm and dry.     Capillary Refill: Capillary refill takes less than 2 seconds.     Coloration: Skin is pale.  Neurological:     General: No focal deficit present.     Mental Status: He is alert.     ED Results / Procedures / Treatments   Labs (all labs ordered are listed, but only abnormal results are displayed) Labs Reviewed  CBC WITH DIFFERENTIAL/PLATELET - Abnormal; Notable for the following components:      Result Value   RBC 3.64 (*)    Hemoglobin 9.0 (*)  HCT 29.9 (*)    MCH 24.7 (*)    RDW 21.2 (*)    Lymphs Abs 0.4 (*)    All  other components within normal limits  URINE CULTURE  CULTURE, BLOOD (ROUTINE X 2)  CULTURE, BLOOD (ROUTINE X 2)  PROTIME-INR  APTT  AMMONIA  URINALYSIS, ROUTINE W REFLEX MICROSCOPIC  LACTIC ACID, PLASMA  LACTIC ACID, PLASMA  COMPREHENSIVE METABOLIC PANEL    EKG EKG Interpretation  Date/Time:  Tuesday March 06 2020 11:05:07 EST Ventricular Rate:  66 PR Interval:    QRS Duration: 109 QT Interval:  388 QTC Calculation: 407 R Axis:   93 Text Interpretation: Sinus rhythm Right axis deviation Confirmed by Pattricia Boss [id number]) on 03/06/2020 12:42:27 PM   Radiology DG Chest Port 1 View  Result Date: 03/06/2020 CLINICAL DATA:  Question sepsis.  Bladder cancer. EXAM: PORTABLE CHEST 1 VIEW COMPARISON:  11/10/2019 FINDINGS: Cardiac and mediastinal contours normal. Elevated right hemidiaphragm with minimal right lower lobe atelectasis. Remaining lungs are clear. No effusion. Port-A-Cath tip in the right atrium unchanged. Generator pack with lead in the left lower neck soft tissues unchanged from the prior study. IMPRESSION: No active disease. Electronically Signed   By: Franchot Gallo M.D.   On: 03/06/2020 11:39    Procedures .Critical Care Performed by: Pattricia Boss, MD Authorized by: Pattricia Boss, MD   Critical care provider statement:    Critical care time (minutes):  45   Critical care end time:  03/06/2020 2:55 PM   Critical care was necessary to treat or prevent imminent or life-threatening deterioration of the following conditions:  CNS failure or compromise   Critical care was time spent personally by me on the following activities:  Discussions with consultants, evaluation of patient's response to treatment, examination of patient, ordering and performing treatments and interventions, ordering and review of laboratory studies, ordering and review of radiographic studies, pulse oximetry, re-evaluation of patient's condition, obtaining history from patient or surrogate and  review of old charts   (including critical care time)  Medications Ordered in ED Medications  lactated ringers bolus 1,000 mL (has no administration in time range)  cefTRIAXone (ROCEPHIN) 2 g in sodium chloride 0.9 % 100 mL IVPB (has no administration in time range)    ED Course  I have reviewed the triage vital signs and the nursing notes.  Pertinent labs & imaging results that were available during my care of the patient were reviewed by me and considered in my medical decision making (see chart for details).    MDM Rules/Calculators/A&P                         1:36 PM Lactic reported at 2.0 74 yo male ho metastatic bladder cancer presents with increasingly altered mental status. On his evaluation here he has multiple new brain mets. He has had decreased p.o. intake. Clinically he appears volume depleted. His creatinine is 1.82 this is somewhat decreased from first prior of November 5 when it was 2. However, this has trended up over the past 2 months with his previous baseline being less than 1. He is receiving IV fluids. He is receiving IV Decadron. Alk phos and AST elevated consistent with hepatic metastatic disease that is again noted on CT scan. CBC is stable Plan consultation and admission to hospitalist. Patient has been seen by palliative care and wife wishes hospice consult. DNR in place Oncology- Dr. Gennaro Africa with Dr. Alen Blew and will see in consult  final Clinical Impression(s) / ED Diagnoses Final diagnoses:  Brain metastases ([place])  Carcinoma of bladder metastatic to liver [facility])  Urinary tract infection with hematuria, site unspecified    Rx / DC Orders ED Discharge Orders    None       Pattricia Boss, MD 03/06/20 1437    Pattricia Boss, MD 03/06/20 1455    Pattricia Boss, MD 03/06/20 1541

## 2020-03-06 NOTE — Progress Notes (Signed)
I was alerted by Dr. Jeanell Sparrow about John Holt presentation and evaluation in the emergency department.  He is known to me with history of advanced bladder cancer presented with changes in mentation and failure to thrive.  CT scan chest abdomen and pelvis as well as CT scan of the head obtained on 03/06/2020 personally reviewed.  Imaging studies showed high-volume disease with extensive metastasis that has progressed.  He has also has developed brain metastasis with vasogenic edema.  His presentation coupled with imaging study would most certainly indicate end-stage disease and will not be candidate for any additional therapy.  Any treatment moving forward should be aimed towards comfort without any aggressive or invasive intervention.  I agree with that continuing dexamethasone which will help with his symptoms and palliate his brain metastasis.  I do not think whole brain radiation will add any additional improvement in his quality of life.  I recommend palliative medicine involvement to help define goals of care transition to comfort care.  We will continue to follow.

## 2020-03-06 NOTE — H&P (Signed)
History and Physical    John Holt OJJ:[id number] DOB: 1945-12-27 DOA: 03/06/2020  PCP: [name], MD  Patient coming from: Home  Chief Complaint: intractable pain, increasing confusion  HPI: John Holt is a 74 y.o. male with medical history significant of recurrent bladder cancer. Patient has difficulty with word finding. Hx per wife. She reports that the patient has been complaining of increasing back pain over the past week. He has gone from walking with a slow, deliberate gate at the beginning to now being unable to walk completely. She states that he says the pain is in his back, but he is unable to localize it further. He has been unable to relieve it with any home medications. She reports that he's become increasingly confused. She went to her CA specialist and they discussed getting some imaging. The CA specialist recommended that he come to the ED.    ED Course: CT imaging shows widely metastatic CA w/ numerous brain lesions and spinal lesions. He was started on decadron. EDP placed consult to his oncologist. [facility] was called for admission.   Review of Systems:  Unable to obtain d/t mentation.   PMHx Past Medical History:  Diagnosis Date  . Anxiety disorder   . Bladder cancer [facility]) urologist-- dr Alyson Ingles ([facility]) and dr Lawerance Bach [facility]):  oncologist-  dr Alen Blew ([facility]) & dr Marcello Moores [facility])   dx 2018--- s/p  TURBT's ;  hx BCG tx's.  chemotherapy completed 2019, and immunotherapy Keytruda , last one 10-21-2018  . BPH (benign prostatic hypertrophy) with urinary obstruction   . History of adenomatous polyp of colon   . History of closed head injury    2000-- fell off ladder--  temporary memory loss resolved  . History of psychosis    x2  last one documented 2006  w/ hallucination  and suicide ideation  . Nocturia   . Port-A-Cath in place 2018  . S/P placement of VNS (vagus nerve stimulation) device 01/28/2018   per pt swipe magnet twice daily  .  Sigmoid diverticulosis   . Sinus bradycardia seen on cardiac monitor    cardiology --  dr g. taylor;   event monitor results 11-15-2018 in epic,  NSR w/ ST and SB and a nocturnal pause   . Temporal lobe epilepsy syndrome [facility]) neurologist-  dr Raliegh Ip. Delice Lesch-  avergae one every 2 weeks "legs jerking around, per wife , pt unaware he's doing this"   localization-related right temporal lobe --  mostly nocturnal w/ bilateral lower extremitity movement, staring and moaning then dissorietation afterwards (12-02-2018 per pt last daytime seizure 11-11-2018 and last nighttime seizure 11-29-2018  . Vesicoureteral reflux, bilateral   . Wears glasses     PSHx Past Surgical History:  Procedure Laterality Date  . COLONOSCOPY    . COLONOSCOPY W/ POLYPECTOMY  09-11-2009  . CYSTOSCOPY W/ RETROGRADES Bilateral 08/13/2018   Procedure: CYSTOSCOPY WITH RETROGRADE PYELOGRAM;  Surgeon: Cleon Gustin, MD;  Location: [facility];  Service: Urology;  Laterality: Bilateral;  . CYSTOSCOPY WITH INJECTION N/A 02/25/2019   Procedure: CYSTOSCOPY WITH INJECTION;  Surgeon: Alexis Frock, MD;  Location: [facility];  Service: Urology;  Laterality: N/A;  . GREEN LIGHT LASER TURP (TRANSURETHRAL RESECTION OF PROSTATE N/A 04/07/2014   Procedure: GREEN LIGHT LASER TURP (TRANSURETHRAL RESECTION OF PROSTATE;  Surgeon: Ailene Rud, MD;  Location: [facility];  Service: Urology;  Laterality: N/A;  . INGUINAL HERNIA REPAIR Bilateral 04/15/2013   Procedure: LAPAROSCOPIC BILATERAL INGUINAL HERNIA  REPAIR;  Surgeon: Gayland Curry, MD;  Location: [facility];  Service: General;  Laterality: Bilateral;  . INSERTION OF MESH N/A 04/15/2013   Procedure: INSERTION OF MESH;  Surgeon: Gayland Curry, MD;  Location: [facility];  Service: General;  Laterality: N/A;  . PORTACATH PLACEMENT  2018  . TRANSURETHRAL RESECTION OF BLADDER  01-27-2017;  05-26-2017;  05-25-2018  both by dr Chriss Czar davis @[facility]   . TRANSURETHRAL RESECTION OF BLADDER TUMOR  N/A 08/13/2018   Procedure: TRANSURETHRAL RESECTION OF BLADDER TUMOR (TURBT);  Surgeon: Cleon Gustin, MD;  Location: [facility];  Service: Urology;  Laterality: N/A;  1 HR  . TRANSURETHRAL RESECTION OF BLADDER TUMOR N/A 12/03/2018   Procedure: TRANSURETHRAL RESECTION OF BLADDER TUMOR (TURBT);  Surgeon: Cleon Gustin, MD;  Location: [facility];  Service: Urology;  Laterality: N/A;  . UMBILICAL HERNIA REPAIR N/A 04/15/2013   Procedure: OPEN HERNIA REPAIR UMBILICAL ADULT;  Surgeon: Gayland Curry, MD;  Location: [facility];  Service: General;  Laterality: N/A;  . VAGUS NERVE STIMULATOR INSERTION Left 01/28/2018   Procedure: VAGAL NERVE STIMULATOR PLACEMENT;  Surgeon: Consuella Lose, MD;  Location: [place];  Service: Neurosurgery;  Laterality: Left;  VAGAL NERVE STIMULATOR PLACEMENT    SocHx  reports that he quit smoking about 47 years ago. His smoking use included cigarettes. He has a 15.00 pack-year smoking history. He has never used smokeless tobacco. He reports current alcohol use of about 14.0 standard drinks of alcohol per week. He reports that he does not use drugs.  Allergies  Allergen Reactions  . Chlorhexidine Rash    Full body  . Betadine [Povidone Iodine] Rash    FamHx Family History  Problem Relation Age of Onset  . Seizures Father   . Heart disease Father   . Cancer Father        prostat  . Cancer Mother        stomach  . Stomach cancer Mother   . Colon cancer Neg Hx   . Colon polyps Neg Hx   . Esophageal cancer Neg Hx   . Rectal cancer Neg Hx     Prior to Admission medications   Medication Sig Start Date End Date Taking? Authorizing Provider  ciprofloxacin (CIPRO) 250 MG tablet Take 1 tablet (250 mg total) by mouth 2 (two) times daily. Patient taking differently: Take 250 mg by mouth 2 (two) times daily. Start date : 03/02/20 03/02/20  Yes Harle Stanford., PA-C  cloBAZam (ONFI) 10 MG tablet Take 1 tablet every night Patient taking differently: Take  10 mg by mouth daily.  02/15/20  Yes Cameron Sprang, MD  clonazePAM (KLONOPIN) 0.5 MG tablet Take 1 tablet as needed for seizure cluster. Do not take more than 2-3 a week Patient taking differently: Take 0.5 mg by mouth daily as needed (for seizure cluster).  09/05/19  Yes Cameron Sprang, MD  ibuprofen (ADVIL) 200 MG tablet Take 800 mg by mouth 3 (three) times daily as needed for moderate pain.    Yes [provider]  levothyroxine (SYNTHROID) 137 MCG tablet Take 137 mcg by mouth every morning. 09/06/19  Yes [provider]  Multiple Vitamin (MULTIVITAMIN WITH MINERALS) TABS tablet Take 1 tablet by mouth daily.    Yes [provider]  polyethylene glycol (MIRALAX / GLYCOLAX) 17 g packet Take 17 g by mouth daily. Patient taking differently: Take 17 g by mouth daily as needed for mild constipation or moderate constipation.  07/29/19  Yes Jacqlyn Larsen, PA-C  prochlorperazine (COMPAZINE) 10 MG tablet Take 1 tablet (10 mg total) by mouth every 6 (six) hours as needed for nausea or vomiting. 08/04/19  Yes Wyatt Portela, MD  senna (SENOKOT) 8.6 MG TABS tablet Take 2 tablets (17.2 mg total) by mouth 2 (two) times daily. 08/02/19  Yes Micheline Rough, MD  traMADol (ULTRAM) 50 MG tablet Take 1 tablet (50 mg total) by mouth every 6 (six) hours as needed. Patient taking differently: Take 50 mg by mouth every 6 (six) hours as needed for moderate pain.  03/02/20  Yes Tanner, Lyndon Code., PA-C  zonisamide (ZONEGRAN) 100 MG capsule TAKE 3 CAPSULES EACH NIGHT Patient taking differently: Take 300 mg by mouth daily.  02/15/20  Yes Cameron Sprang, MD  furosemide (LASIX) 20 MG tablet TAKE 1 TABLET(20 MG) BY MOUTH DAILY Patient taking differently: Take 20 mg by mouth daily as needed.  02/09/20   Wyatt Portela, MD  PREVIDENT 5000 BOOSTER PLUS 1.1 % PSTE  01/11/20   [provider]  sulfamethoxazole-trimethoprim (BACTRIM DS) 800-160 MG tablet Take 1 tablet by mouth 2 (two) times daily. Patient  not taking: Reported on 03/06/2020 12/26/19   Wyatt Portela, MD    Physical Exam: Vitals:   03/06/20 1300 03/06/20 1330 03/06/20 1400 03/06/20 1430  BP: 139/79 (!) 151/81 127/85 129/72  Pulse: (!) 102 68 86   Resp: 16 15 13 15   Temp:      TempSrc:      SpO2: (!) 88% 96% 96%     General: 74 y.o. ill appearing male resting in bed Eyes: PERRL, normal sclera ENMT: Nares patent w/o discharge, orophaynx clear, dentition normal, ears w/o discharge/lesions/ulcers Neck: Supple, trachea midline Cardiovascular: RRR, +S1, S2, no m/g/r, equal pulses throughout Respiratory: CTABL, no w/r/r, normal WOB GI: BS+, NDNT, no masses noted, no organomegaly noted MSK: No e/c/c Skin: No rashes, bruises, ulcerations noted Neuro: A&O x name/year, difficulty following instructions, BLE msk strk decreased Psyc: calm/cooperative  Labs on Admission: I have personally reviewed following labs and imaging studies  CBC: Recent Labs  Lab 03/02/20 1322 03/06/20 1110  WBC 6.2 6.3  NEUTROABS 3.8 4.5  HGB 9.9* 9.0*  HCT 32.5* 29.9*  MCV 80.6 82.1  PLT 270 782   Basic Metabolic Panel: Recent Labs  Lab 03/02/20 1322 03/06/20 1110  NA 136 134*  K 4.3 4.2  CL 108 106  CO2 20* 21*  GLUCOSE 82 88  BUN 46* 46*  CREATININE 2.06* 1.82*  CALCIUM 9.9 10.5*   GFR: Estimated Creatinine Clearance: 29.3 mL/min (A) (by C-G formula based on SCr of 1.82 mg/dL (H)). Liver Function Tests: Recent Labs  Lab 03/02/20 1322 03/06/20 1110  AST 56* 66*  ALT 29 27  ALKPHOS 270* 246*  BILITOT 0.2* 0.4  PROT 6.7 6.6  ALBUMIN 3.1* 3.1*   No results for input(s): LIPASE, AMYLASE in the last 168 hours. Recent Labs  Lab 03/06/20 1110  AMMONIA 31   Coagulation Profile: Recent Labs  Lab 03/06/20 1110  INR 1.2   Cardiac Enzymes: No results for input(s): CKTOTAL, CKMB, CKMBINDEX, TROPONINI in the last 168 hours. BNP (last 3 results) No results for input(s): PROBNP in the last 8760 hours. HbA1C: No results  for input(s): HGBA1C in the last 72 hours. CBG: No results for input(s): GLUCAP in the last 168 hours. Lipid Profile: No results for input(s): CHOL, HDL, LDLCALC, TRIG, CHOLHDL, LDLDIRECT in the last 72 hours. Thyroid Function Tests: No  results for input(s): TSH, T4TOTAL, FREET4, T3FREE, THYROIDAB in the last 72 hours. Anemia Panel: No results for input(s): VITAMINB12, FOLATE, FERRITIN, TIBC, IRON, RETICCTPCT in the last 72 hours. Urine analysis:    Component Value Date/Time   COLORURINE YELLOW 03/02/2020 1318   APPEARANCEUR CLOUDY (A) 03/02/2020 1318   LABSPEC 1.010 03/02/2020 1318   PHURINE 6.0 03/02/2020 1318   GLUCOSEU NEGATIVE 03/02/2020 1318   HGBUR SMALL (A) 03/02/2020 1318   BILIRUBINUR NEGATIVE 03/02/2020 1318   KETONESUR NEGATIVE 03/02/2020 1318   PROTEINUR 100 (A) 03/02/2020 1318   UROBILINOGEN 0.2 06/11/2010 1135   NITRITE POSITIVE (A) 03/02/2020 1318   LEUKOCYTESUR LARGE (A) 03/02/2020 1318    Radiological Exams on Admission: CT Head W or Wo Contrast  Result Date: 03/06/2020 CLINICAL DATA:  Metastatic disease evaluation. EXAM: CT HEAD WITHOUT AND WITH CONTRAST TECHNIQUE: Contiguous axial images were obtained from the base of the skull through the vertex without and with intravenous contrast CONTRAST:  63mL OMNIPAQUE IOHEXOL 300 MG/ML  SOLN COMPARISON:  CT head July 31, 2019. FINDINGS: Brain: There are numerous centrally hypodense, peripherally enhancing round lesions throughout the infratentorial and supratentorial brain. Many these of these lesions are located at the gray-white matter interface. There also are lesions in bilateral caudate. Index lesion in the posterior left cerebellum measures 1.2 by 0.8 cm on series 5, image 9. Index lesion within the inferior left frontal lobe measures 0.1 cm on series 5, image 14. Index lesion within the right frontal cortex measures 1.2 cm on series 5, image 17. Many of these lesions demonstrate surrounding vasogenic edema without  substantial mass effect or midline shift. No hydrocephalus. Similar is lesions may have a small amount of intralesional hemorrhage, but otherwise no evidence of acute hemorrhage. Vascular: No hyperdense vessel or unexpected calcification. Visible vessels are patent. Skull: Normal. Negative for fracture or focal lesion. Sinuses/Orbits: No acute findings. Other: No mastoid effusions. IMPRESSION: Numerous infratentorial and supratentorial peripherally enhancing lesions, highly concerning for metastatic disease given the patient's clinical history. Many of the lesions have surrounding vasogenic edema without substantial mass effect or midline shift. If the patient is able, recommend MRI with and without contrast for more complete evaluation. Electronically Signed   By: Margaretha Sheffield MD   On: 03/06/2020 13:42   CT Chest W Contrast  Result Date: 03/06/2020 CLINICAL DATA:  Bladder cancer, evaluate metastatic disease EXAM: CT CHEST, ABDOMEN, AND PELVIS WITH CONTRAST TECHNIQUE: Multidetector CT imaging of the chest, abdomen and pelvis was performed following the standard protocol during bolus administration of intravenous contrast. CONTRAST:  83mL OMNIPAQUE IOHEXOL 300 MG/ML  SOLN COMPARISON:  12/23/2019 FINDINGS: CT CHEST FINDINGS Cardiovascular: Right chest port catheter. Normal heart size. No pericardial effusion. Mediastinum/Nodes: No enlarged mediastinal, hilar, or axillary lymph nodes. Moderate hiatal hernia with intrathoracic position of the gastric fundus. Thyroid gland, trachea, and esophagus demonstrate no significant findings. Lungs/Pleura: Scarring and or atelectasis of the bilateral lung bases and trace pleural effusions. No pleural effusion or pneumothorax. Musculoskeletal: No chest wall mass. CT ABDOMEN PELVIS FINDINGS Hepatobiliary: Redemonstrated bulky, hypodense hepatic metastatic disease, significantly worsened compared to prior examination, an index lesion in the posterior dome measuring 10.5 x  9.2 cm, previously 5.4 x 5.6 cm (series 3, image 47). No gallstones, gallbladder wall thickening, or biliary dilatation. Pancreas: Unremarkable. No pancreatic ductal dilatation or surrounding inflammatory changes. Spleen: Normal in size without significant abnormality. Adrenals/Urinary Tract: Adrenal glands are unremarkable. Kidneys are normal, without renal calculi or hydronephrosis. Status post cystoprostatectomy and right  lower quadrant ileal conduit urinary diversion. Bilateral hydronephrosis observed on prior examination is resolved. Stomach/Bowel: Stomach is within normal limits. Appendix is not clearly visualized. No evidence of bowel wall thickening, distention, or inflammatory changes. Vascular/Lymphatic: No significant vascular findings are present. No enlarged abdominal or pelvic lymph nodes. Reproductive: Status post cystoprostatectomy. Other: No abdominal wall hernia or abnormality. No abdominopelvic ascites. Musculoskeletal: Redemonstrated widespread lytic osseous metastatic disease, not significantly changed compared to prior examination. Redemonstrated pathologic wedge deformities of the T3 and T9 vertebral bodies. IMPRESSION: 1. Bulky hepatic metastatic disease significantly worsened compared to prior examination. 2. Redemonstrated widespread lytic osseous metastatic disease, not significantly changed compared to prior examination, notable for pathologic wedge deformities of the T3 and T9 vertebral bodies. 3. Status post cystoprostatectomy and right lower quadrant ileal conduit urinary diversion. 4. Bilateral hydronephrosis observed on prior examination is resolved. Electronically Signed   By: Eddie Candle M.D.   On: 03/06/2020 14:06   CT ABDOMEN PELVIS W CONTRAST  Result Date: 03/06/2020 CLINICAL DATA:  Bladder cancer, evaluate metastatic disease EXAM: CT CHEST, ABDOMEN, AND PELVIS WITH CONTRAST TECHNIQUE: Multidetector CT imaging of the chest, abdomen and pelvis was performed following the  standard protocol during bolus administration of intravenous contrast. CONTRAST:  68mL OMNIPAQUE IOHEXOL 300 MG/ML  SOLN COMPARISON:  12/23/2019 FINDINGS: CT CHEST FINDINGS Cardiovascular: Right chest port catheter. Normal heart size. No pericardial effusion. Mediastinum/Nodes: No enlarged mediastinal, hilar, or axillary lymph nodes. Moderate hiatal hernia with intrathoracic position of the gastric fundus. Thyroid gland, trachea, and esophagus demonstrate no significant findings. Lungs/Pleura: Scarring and or atelectasis of the bilateral lung bases and trace pleural effusions. No pleural effusion or pneumothorax. Musculoskeletal: No chest wall mass. CT ABDOMEN PELVIS FINDINGS Hepatobiliary: Redemonstrated bulky, hypodense hepatic metastatic disease, significantly worsened compared to prior examination, an index lesion in the posterior dome measuring 10.5 x 9.2 cm, previously 5.4 x 5.6 cm (series 3, image 47). No gallstones, gallbladder wall thickening, or biliary dilatation. Pancreas: Unremarkable. No pancreatic ductal dilatation or surrounding inflammatory changes. Spleen: Normal in size without significant abnormality. Adrenals/Urinary Tract: Adrenal glands are unremarkable. Kidneys are normal, without renal calculi or hydronephrosis. Status post cystoprostatectomy and right lower quadrant ileal conduit urinary diversion. Bilateral hydronephrosis observed on prior examination is resolved. Stomach/Bowel: Stomach is within normal limits. Appendix is not clearly visualized. No evidence of bowel wall thickening, distention, or inflammatory changes. Vascular/Lymphatic: No significant vascular findings are present. No enlarged abdominal or pelvic lymph nodes. Reproductive: Status post cystoprostatectomy. Other: No abdominal wall hernia or abnormality. No abdominopelvic ascites. Musculoskeletal: Redemonstrated widespread lytic osseous metastatic disease, not significantly changed compared to prior examination.  Redemonstrated pathologic wedge deformities of the T3 and T9 vertebral bodies. IMPRESSION: 1. Bulky hepatic metastatic disease significantly worsened compared to prior examination. 2. Redemonstrated widespread lytic osseous metastatic disease, not significantly changed compared to prior examination, notable for pathologic wedge deformities of the T3 and T9 vertebral bodies. 3. Status post cystoprostatectomy and right lower quadrant ileal conduit urinary diversion. 4. Bilateral hydronephrosis observed on prior examination is resolved. Electronically Signed   By: Eddie Candle M.D.   On: 03/06/2020 14:06   DG Chest Port 1 View  Result Date: 03/06/2020 CLINICAL DATA:  Question sepsis.  Bladder cancer. EXAM: PORTABLE CHEST 1 VIEW COMPARISON:  11/10/2019 FINDINGS: Cardiac and mediastinal contours normal. Elevated right hemidiaphragm with minimal right lower lobe atelectasis. Remaining lungs are clear. No effusion. Port-A-Cath tip in the right atrium unchanged. Generator pack with lead in the left lower neck soft tissues  unchanged from the prior study. IMPRESSION: No active disease. Electronically Signed   By: Franchot Gallo M.D.   On: 03/06/2020 11:39   Assessment/Plan Stage IV bladder cancer w/ Brain/Liver/Spine mets Confusion/Weakness/Intractable Pain secondary to above     - admit to obs, med-surg     - consult palliative care/hospice     - patient is DNR     - brain mets are a new discovery for the pt/family; family is interested in hospice options     - got decadron IV in ED; continue q6h for now     - prn fentanyl     - spoke with oncall onco; his primary oncologist is being made aware  UTI     - dx'd with UTI on 11/5; was taking cipro; UCx positive for Kleb pna     - transitioned to rocephin; continue to complete total 7 day course  Hyponatremia     - mild; follow  Hx of seizures     - PRN ativan, continue home regimen  Lactic acidosis     - fluids     - recently dx'd w/ UTI; see  above  Normocytic anemia     - no evidence of bleed; follow  Elevated LFTs     - consistent w/ liver mets  AKI     - baseline Scr is about 1.1; he is 1.82 at admission     - no obstruction seen on CT     - fluids  DVT prophylaxis: SCD  Code Status: DNR  Family Communication: With wife at bedside  Consults called: Oncology ([place] --> Alen Blew is his primary onco) Status is: Observation  The patient remains OBS appropriate and will d/c before 2 midnights.  Dispo: The patient is from: Home              Anticipated d/c is to: Home              Anticipated d/c date is: 1 day              Patient currently is not medically stable to d/c.  Jonnie Finner DO [facility]  If 7PM-7AM, please contact night-coverage [email or web address]  03/06/2020, 2:53 PM

## 2020-03-06 NOTE — Telephone Encounter (Signed)
Patient's wife [name] called and stated patient has not improved since his visit in [facility] on 03/02/20. Per wife, patient is confused, sleeps a lot, moans and cries in pain, and is weak. Per wife patient has been taking Cipro but she does not think it is helping. Spoke to [employer], [place] and Dr. Alen Blew and they instructed patient to go to the ED. [name] made aware and verbalized understanding.

## 2020-03-06 NOTE — ED Triage Notes (Signed)
Wife is speaking for pt. States he has bladder cancer and is followed at cancer center. Still receiving treatment. Pt was dx UTI and started on Cipro on Friday. Pt more lethargic and confused.

## 2020-03-07 DIAGNOSIS — G936 Cerebral edema: Secondary | ICD-10-CM | POA: Diagnosis present

## 2020-03-07 DIAGNOSIS — R410 Disorientation, unspecified: Secondary | ICD-10-CM | POA: Diagnosis not present

## 2020-03-07 DIAGNOSIS — G40909 Epilepsy, unspecified, not intractable, without status epilepticus: Secondary | ICD-10-CM | POA: Diagnosis present

## 2020-03-07 DIAGNOSIS — R319 Hematuria, unspecified: Secondary | ICD-10-CM | POA: Diagnosis present

## 2020-03-07 DIAGNOSIS — Z66 Do not resuscitate: Secondary | ICD-10-CM | POA: Diagnosis present

## 2020-03-07 DIAGNOSIS — E871 Hypo-osmolality and hyponatremia: Secondary | ICD-10-CM | POA: Diagnosis present

## 2020-03-07 DIAGNOSIS — Z86010 Personal history of colonic polyps: Secondary | ICD-10-CM | POA: Diagnosis not present

## 2020-03-07 DIAGNOSIS — D849 Immunodeficiency, unspecified: Secondary | ICD-10-CM | POA: Diagnosis present

## 2020-03-07 DIAGNOSIS — F419 Anxiety disorder, unspecified: Secondary | ICD-10-CM | POA: Diagnosis present

## 2020-03-07 DIAGNOSIS — R627 Adult failure to thrive: Secondary | ICD-10-CM | POA: Diagnosis present

## 2020-03-07 DIAGNOSIS — E869 Volume depletion, unspecified: Secondary | ICD-10-CM | POA: Diagnosis present

## 2020-03-07 DIAGNOSIS — Z20822 Contact with and (suspected) exposure to covid-19: Secondary | ICD-10-CM | POA: Diagnosis present

## 2020-03-07 DIAGNOSIS — N179 Acute kidney failure, unspecified: Secondary | ICD-10-CM | POA: Diagnosis present

## 2020-03-07 DIAGNOSIS — Z87891 Personal history of nicotine dependence: Secondary | ICD-10-CM | POA: Diagnosis not present

## 2020-03-07 DIAGNOSIS — E872 Acidosis: Secondary | ICD-10-CM | POA: Diagnosis present

## 2020-03-07 DIAGNOSIS — Z79899 Other long term (current) drug therapy: Secondary | ICD-10-CM | POA: Diagnosis not present

## 2020-03-07 DIAGNOSIS — D630 Anemia in neoplastic disease: Secondary | ICD-10-CM | POA: Diagnosis present

## 2020-03-07 DIAGNOSIS — C7951 Secondary malignant neoplasm of bone: Secondary | ICD-10-CM | POA: Diagnosis present

## 2020-03-07 DIAGNOSIS — Z515 Encounter for palliative care: Secondary | ICD-10-CM | POA: Diagnosis not present

## 2020-03-07 DIAGNOSIS — N401 Enlarged prostate with lower urinary tract symptoms: Secondary | ICD-10-CM | POA: Diagnosis present

## 2020-03-07 DIAGNOSIS — G9341 Metabolic encephalopathy: Secondary | ICD-10-CM | POA: Diagnosis present

## 2020-03-07 DIAGNOSIS — C7931 Secondary malignant neoplasm of brain: Secondary | ICD-10-CM | POA: Diagnosis present

## 2020-03-07 DIAGNOSIS — C679 Malignant neoplasm of bladder, unspecified: Secondary | ICD-10-CM | POA: Diagnosis present

## 2020-03-07 DIAGNOSIS — N390 Urinary tract infection, site not specified: Secondary | ICD-10-CM | POA: Diagnosis present

## 2020-03-07 DIAGNOSIS — C787 Secondary malignant neoplasm of liver and intrahepatic bile duct: Secondary | ICD-10-CM | POA: Diagnosis present

## 2020-03-07 MED ORDER — LORAZEPAM 2 MG/ML IJ SOLN
1.0000 mg | INTRAMUSCULAR | Status: DC | PRN
  Administered 2020-03-07 – 2020-03-08 (×3): 1 mg via INTRAVENOUS
  Filled 2020-03-07 (×3): qty 1

## 2020-03-07 MED ORDER — MORPHINE SULFATE (PF) 2 MG/ML IV SOLN
1.0000 mg | INTRAVENOUS | Status: DC | PRN
  Administered 2020-03-07 (×2): 1 mg via INTRAVENOUS
  Filled 2020-03-07 (×2): qty 1

## 2020-03-07 MED ORDER — MORPHINE SULFATE (PF) 2 MG/ML IV SOLN: 1.0000 mg | INTRAVENOUS | Status: DC | PRN

## 2020-03-07 MED ORDER — LORAZEPAM 2 MG/ML PO CONC
1.0000 mg | ORAL | Status: DC | PRN
  Filled 2020-03-07: qty 0.5

## 2020-03-07 MED ORDER — HALOPERIDOL 0.5 MG PO TABS
0.5000 mg | ORAL_TABLET | ORAL | Status: DC | PRN
  Filled 2020-03-07: qty 1

## 2020-03-07 MED ORDER — BISACODYL 10 MG RE SUPP: 10.0000 mg | Freq: Every day | RECTAL | Status: DC | PRN

## 2020-03-07 MED ORDER — LIP MEDEX EX OINT
TOPICAL_OINTMENT | CUTANEOUS | Status: AC
  Filled 2020-03-07: qty 7

## 2020-03-07 MED ORDER — PROCHLORPERAZINE MALEATE 5 MG PO TABS
5.0000 mg | ORAL_TABLET | Freq: Four times a day (QID) | ORAL | Status: DC | PRN
  Filled 2020-03-07: qty 1

## 2020-03-07 MED ORDER — HALOPERIDOL LACTATE 2 MG/ML PO CONC
0.5000 mg | ORAL | Status: DC | PRN
  Filled 2020-03-07: qty 0.3

## 2020-03-07 MED ORDER — ACETAMINOPHEN 650 MG RE SUPP: 650.0000 mg | Freq: Four times a day (QID) | RECTAL | Status: DC | PRN

## 2020-03-07 MED ORDER — MORPHINE SULFATE (PF) 2 MG/ML IV SOLN
2.0000 mg | INTRAVENOUS | Status: DC | PRN
  Administered 2020-03-07 – 2020-03-08 (×6): 2 mg via INTRAVENOUS
  Filled 2020-03-07 (×6): qty 1

## 2020-03-07 MED ORDER — HALOPERIDOL LACTATE 5 MG/ML IJ SOLN
0.5000 mg | INTRAMUSCULAR | Status: DC | PRN
  Administered 2020-03-08: 02:00:00 0.5 mg via INTRAVENOUS
  Filled 2020-03-07: qty 1

## 2020-03-07 MED ORDER — PROCHLORPERAZINE 25 MG RE SUPP
25.0000 mg | Freq: Two times a day (BID) | RECTAL | Status: DC | PRN
  Filled 2020-03-07: qty 1

## 2020-03-07 MED ORDER — LORAZEPAM 1 MG PO TABS: 1.0000 mg | ORAL_TABLET | ORAL | Status: DC | PRN

## 2020-03-07 MED ORDER — PROCHLORPERAZINE EDISYLATE 10 MG/2ML IJ SOLN: 10.0000 mg | Freq: Two times a day (BID) | INTRAMUSCULAR | Status: DC | PRN

## 2020-03-07 MED ORDER — ACETAMINOPHEN 325 MG PO TABS: 650.0000 mg | ORAL_TABLET | Freq: Four times a day (QID) | ORAL | Status: DC | PRN

## 2020-03-07 NOTE — Progress Notes (Signed)
Complete note to follow.   Patient seen and examined.  Mrs. Debord at bedside.  Patient looks lethargic however able to keep up some conversation.  Not sure he understands everything however he understood that he does not have long time to live  and is agreeable to comfort care and hospice.  Plan: Comfort care All comfort care medications available, continue dexamethasone Palliative to see to guide hospice at home versus hospice home admission Stay in the hospital until hospice arrangements are made. RN to pronounce death if happens in the hospital.

## 2020-03-07 NOTE — Progress Notes (Signed)
PROGRESS NOTE    [facility]  GXQ:[id number] DOB: 09/21/1945 DOA: 03/06/2020 PCP: [name], MD    Brief Narrative:  74 year old gentleman with history of recurrent bladder cancer, recently worsening health status presented to the ER with worsening back pain, difficulty with word finding.  Patient had become increasingly confused. In the emergency room hemodynamically stable.  Patient was found to have widely metastatic bladder cancer with numerous brain lesions in his spine lesions.   Assessment & Plan:   Active Problems:   AMS (altered mental status)   End of life care  End-stage bladder cancer despite multiple treatments, hepatic metastasis, brain mets, poor functional status Altered mental status, acute metabolic encephalopathy Failure to thrive  Plan: Patient not doing well despite maximum medical therapy and cancer treatment.  Additional discussion about end-of-life care done and patient and his wife agreed for hospice level of care. Do not escalate treatment.  No lab draws.  No telemetry. All comfort care medications available, will start patient on scheduled morphine, Ativan and other supportive treatment. DNR/DNI and comfort care RN to pronounce death if happens in the hospital. Consult hospice.  Transfer to inpatient hospice when bed is available to provide end-of-life care.  Discussed case with palliative care team.  Called hospice to evaluate patient to transfer.   DVT prophylaxis: Comfort care   Code Status: Comfort care Family Communication: Wife at the bedside Disposition Plan: Status is: Inpatient  Remains inpatient appropriate because:IV treatments appropriate due to intensity of illness or inability to take PO and Inpatient level of care appropriate due to severity of illness   Dispo: The patient is from: Home              Anticipated d/c is to: Inpatient hospice              Anticipated d/c date is: 1 day              Patient currently is  not medically stable to d/c. Patient is only stable to discharge to hospice level of care        Consultants:   Palliative  Procedures:   None  Antimicrobials:   Rocephin, discontinued   Subjective: Patient seen and examined.  Wife was at the bedside.  Detailed discussion was held regarding treatment plan and hospice recommendations by patient's oncology. Patient was complaining of pain all night, he was given a dose of morphine 1 mg and that helped him. He is poor historian.  He is having some word finding difficulties.  He was able to tell that he is not doing well and he has cancer.  Patient was not able to keep up conversation, fell asleep.   Objective: Vitals:   03/06/20 1807 03/06/20 2050 03/07/20 0130 03/07/20 0442  BP: 131/66 121/74 105/67 104/70  Pulse: 68 81 68 80  Resp: 14 16 17 15   Temp: 98 F (36.7 C) 97.9 F (36.6 C)    TempSrc: Oral Oral    SpO2: 100% 98% 98% 98%    Intake/Output Summary (Last 24 hours) at 03/07/2020 1326 Last data filed at 03/07/2020 1000 Gross per 24 hour  Intake 100 ml  Output 1100 ml  Net -1000 ml   There were no vitals filed for this visit.  Examination:  General exam: Appears calm.  Uncomfortable with moderate pain at times. Respiratory system: Clear to auscultation. Respiratory effort normal.  No added sounds. Right chest Port-A-Cath present. Cardiovascular system: S1 & S2 heard, RRR. No JVD, murmurs,  rubs, gallops or clicks. No pedal edema. Gastrointestinal system: Soft and nontender.  He has urostomy present with clear urine. Central nervous system: Alert on his stimulation.  Moderate distress. Extremities: Symmetric 5 x 5 power.  Moves all extremities. Skin: No rashes, lesions or ulcers Psychiatry: Judgement and insight appear compromised.  Mood and affect is flat.    Data Reviewed: I have personally reviewed following labs and imaging studies  CBC: Recent Labs  Lab 03/02/20 1322 03/06/20 1110  WBC 6.2 6.3   NEUTROABS 3.8 4.5  HGB 9.9* 9.0*  HCT 32.5* 29.9*  MCV 80.6 82.1  PLT 270 563   Basic Metabolic Panel: Recent Labs  Lab 03/02/20 1322 03/06/20 1110  NA 136 134*  K 4.3 4.2  CL 108 106  CO2 20* 21*  GLUCOSE 82 88  BUN 46* 46*  CREATININE 2.06* 1.82*  CALCIUM 9.9 10.5*   GFR: Estimated Creatinine Clearance: 29.3 mL/min (A) (by C-G formula based on SCr of 1.82 mg/dL (H)). Liver Function Tests: Recent Labs  Lab 03/02/20 1322 03/06/20 1110  AST 56* 66*  ALT 29 27  ALKPHOS 270* 246*  BILITOT 0.2* 0.4  PROT 6.7 6.6  ALBUMIN 3.1* 3.1*   No results for input(s): LIPASE, AMYLASE in the last 168 hours. Recent Labs  Lab 03/06/20 1110  AMMONIA 31   Coagulation Profile: Recent Labs  Lab 03/06/20 1110  INR 1.2   Cardiac Enzymes: No results for input(s): CKTOTAL, CKMB, CKMBINDEX, TROPONINI in the last 168 hours. BNP (last 3 results) No results for input(s): PROBNP in the last 8760 hours. HbA1C: No results for input(s): HGBA1C in the last 72 hours. CBG: No results for input(s): GLUCAP in the last 168 hours. Lipid Profile: No results for input(s): CHOL, HDL, LDLCALC, TRIG, CHOLHDL, LDLDIRECT in the last 72 hours. Thyroid Function Tests: No results for input(s): TSH, T4TOTAL, FREET4, T3FREE, THYROIDAB in the last 72 hours. Anemia Panel: No results for input(s): VITAMINB12, FOLATE, FERRITIN, TIBC, IRON, RETICCTPCT in the last 72 hours. Sepsis Labs: Recent Labs  Lab 03/06/20 1110 03/06/20 1452  LATICACIDVEN 2.0* 2.6*    Recent Results (from the past 240 hour(s))  Urine Culture     Status: Abnormal   Collection Time: 03/02/20  1:16 PM   Specimen: Urine, Clean Catch  Result Value Ref Range Status   Specimen Description   Final    URINE, CLEAN CATCH Performed at [facility], [place] [address] [place], [place] [address]    Special Requests   Final    NONE Performed at [facility], [place] [address],  [place], [place] [address]    Culture >=100,000 COLONIES/mL KLEBSIELLA PNEUMONIAE (A)  Final   Report Status 03/05/2020 FINAL  Final   Organism ID, Bacteria KLEBSIELLA PNEUMONIAE (A)  Final      Susceptibility   Klebsiella pneumoniae - MIC*    AMPICILLIN >=32 RESISTANT Resistant     CEFAZOLIN <=4 SENSITIVE Sensitive     CEFEPIME <=0.12 SENSITIVE Sensitive     CEFTRIAXONE <=0.25 SENSITIVE Sensitive     CIPROFLOXACIN <=0.25 SENSITIVE Sensitive     GENTAMICIN <=1 SENSITIVE Sensitive     IMIPENEM <=0.25 SENSITIVE Sensitive     NITROFURANTOIN 32 SENSITIVE Sensitive     TRIMETH/SULFA <=20 SENSITIVE Sensitive     AMPICILLIN/SULBACTAM 4 SENSITIVE Sensitive     PIP/TAZO <=4 SENSITIVE Sensitive     * >=100,000 COLONIES/mL KLEBSIELLA PNEUMONIAE  Blood Culture (routine x 2)     Status:  None (Preliminary result)   Collection Time: 03/06/20 11:10 AM   Specimen: BLOOD  Result Value Ref Range Status   Specimen Description   Final    BLOOD SITE NOT SPECIFIED Performed at [facility], [address], [place], [place] [address]    Special Requests   Final    BOTTLES DRAWN AEROBIC AND ANAEROBIC Blood Culture adequate volume Performed at [place] [address], [place], [place] [address]    Culture   Final    NO GROWTH < 24 HOURS Performed at [place] [address], [place], [place] [address]    Report Status PENDING  Incomplete  Blood Culture (routine x 2)     Status: None (Preliminary result)   Collection Time: 03/06/20 11:25 AM   Specimen: BLOOD  Result Value Ref Range Status   Specimen Description   Final    BLOOD SITE NOT SPECIFIED Performed at [place] [address] [place], [place] [address]    Special Requests   Final    BOTTLES DRAWN AEROBIC AND ANAEROBIC Blood Culture adequate volume Performed at [place] [address] [place], [place] [address]    Culture   Final    NO GROWTH < 24 HOURS Performed at  [place] [address], [place], [place] [address]    Report Status PENDING  Incomplete  Respiratory Panel by RT PCR (Flu A&B, Covid) - Nasopharyngeal Swab     Status: None   Collection Time: 03/06/20  4:50 PM   Specimen: Nasopharyngeal Swab  Result Value Ref Range Status   SARS Coronavirus 2 by RT PCR NEGATIVE NEGATIVE Final    Comment: (NOTE) SARS-CoV-2 target nucleic acids are NOT DETECTED.  The SARS-CoV-2 RNA is generally detectable in upper respiratoy specimens during the acute phase of infection. The lowest concentration of SARS-CoV-2 viral copies this assay can detect is 131 copies/mL. A negative result does not preclude SARS-Cov-2 infection and should not be used as the sole basis for treatment or other patient management decisions. A negative result may occur with  improper specimen collection/handling, submission of specimen other than nasopharyngeal swab, presence of viral mutation(s) within the areas targeted by this assay, and inadequate number of viral copies (<131 copies/mL). A negative result must be combined with clinical observations, patient history, and epidemiological information. The expected result is Negative.  Fact Sheet for Patients:  PinkCheek.be  Fact Sheet for Healthcare Providers:  GravelBags.it  This test is no t yet approved or cleared by the [place] FDA and  has been authorized for detection and/or diagnosis of SARS-CoV-2 by FDA under an Emergency Use Authorization (EUA). This EUA will remain  in effect (meaning this test can be used) for the duration of the COVID-19 declaration under Section 564(b)(1) of the Act, 21 U.S.C. section 360bbb-3(b)(1), unless the authorization is terminated or revoked sooner.     Influenza A by PCR NEGATIVE NEGATIVE Final   Influenza B by PCR NEGATIVE NEGATIVE Final    Comment: (NOTE) The Xpert Xpress SARS-CoV-2/FLU/RSV assay is intended as  an aid in  the diagnosis of influenza from Nasopharyngeal swab specimens and  should not be used as a sole basis for treatment. Nasal washings and  aspirates are unacceptable for Xpert Xpress SARS-CoV-2/FLU/RSV  testing.  Fact Sheet for Patients: PinkCheek.be  Fact Sheet for Healthcare Providers: GravelBags.it  This test is not yet approved or cleared by the [place] and  has been authorized for detection and/or diagnosis of SARS-CoV-2 by  FDA under an Emergency Use Authorization (EUA). This EUA will remain  in effect (meaning this test can be used) for the duration of the  Covid-19 declaration under Section 564(b)(1) of the Act, 21  U.S.C. section 360bbb-3(b)(1), unless the authorization is  terminated or revoked. Performed at [facility], [place] [address], [place], [place] [address]          Radiology Studies: CT Head W or Wo Contrast  Result Date: 03/06/2020 CLINICAL DATA:  Metastatic disease evaluation. EXAM: CT HEAD WITHOUT AND WITH CONTRAST TECHNIQUE: Contiguous axial images were obtained from the base of the skull through the vertex without and with intravenous contrast CONTRAST:  60mL OMNIPAQUE IOHEXOL 300 MG/ML  SOLN COMPARISON:  CT head July 31, 2019. FINDINGS: Brain: There are numerous centrally hypodense, peripherally enhancing round lesions throughout the infratentorial and supratentorial brain. Many these of these lesions are located at the gray-white matter interface. There also are lesions in bilateral caudate. Index lesion in the posterior left cerebellum measures 1.2 by 0.8 cm on series 5, image 9. Index lesion within the inferior left frontal lobe measures 0.1 cm on series 5, image 14. Index lesion within the right frontal cortex measures 1.2 cm on series 5, image 17. Many of these lesions demonstrate surrounding vasogenic edema without substantial mass effect or midline shift. No  hydrocephalus. Similar is lesions may have a small amount of intralesional hemorrhage, but otherwise no evidence of acute hemorrhage. Vascular: No hyperdense vessel or unexpected calcification. Visible vessels are patent. Skull: Normal. Negative for fracture or focal lesion. Sinuses/Orbits: No acute findings. Other: No mastoid effusions. IMPRESSION: Numerous infratentorial and supratentorial peripherally enhancing lesions, highly concerning for metastatic disease given the patient's clinical history. Many of the lesions have surrounding vasogenic edema without substantial mass effect or midline shift. If the patient is able, recommend MRI with and without contrast for more complete evaluation. Electronically Signed   By: Margaretha Sheffield MD   On: 03/06/2020 13:42   CT Chest W Contrast  Result Date: 03/06/2020 CLINICAL DATA:  Bladder cancer, evaluate metastatic disease EXAM: CT CHEST, ABDOMEN, AND PELVIS WITH CONTRAST TECHNIQUE: Multidetector CT imaging of the chest, abdomen and pelvis was performed following the standard protocol during bolus administration of intravenous contrast. CONTRAST:  20mL OMNIPAQUE IOHEXOL 300 MG/ML  SOLN COMPARISON:  12/23/2019 FINDINGS: CT CHEST FINDINGS Cardiovascular: Right chest port catheter. Normal heart size. No pericardial effusion. Mediastinum/Nodes: No enlarged mediastinal, hilar, or axillary lymph nodes. Moderate hiatal hernia with intrathoracic position of the gastric fundus. Thyroid gland, trachea, and esophagus demonstrate no significant findings. Lungs/Pleura: Scarring and or atelectasis of the bilateral lung bases and trace pleural effusions. No pleural effusion or pneumothorax. Musculoskeletal: No chest wall mass. CT ABDOMEN PELVIS FINDINGS Hepatobiliary: Redemonstrated bulky, hypodense hepatic metastatic disease, significantly worsened compared to prior examination, an index lesion in the posterior dome measuring 10.5 x 9.2 cm, previously 5.4 x 5.6 cm (series 3,  image 47). No gallstones, gallbladder wall thickening, or biliary dilatation. Pancreas: Unremarkable. No pancreatic ductal dilatation or surrounding inflammatory changes. Spleen: Normal in size without significant abnormality. Adrenals/Urinary Tract: Adrenal glands are unremarkable. Kidneys are normal, without renal calculi or hydronephrosis. Status post cystoprostatectomy and right lower quadrant ileal conduit urinary diversion. Bilateral hydronephrosis observed on prior examination is resolved. Stomach/Bowel: Stomach is within normal limits. Appendix is not clearly visualized. No evidence of bowel wall thickening, distention, or inflammatory changes. Vascular/Lymphatic: No significant vascular findings  are present. No enlarged abdominal or pelvic lymph nodes. Reproductive: Status post cystoprostatectomy. Other: No abdominal wall hernia or abnormality. No abdominopelvic ascites. Musculoskeletal: Redemonstrated widespread lytic osseous metastatic disease, not significantly changed compared to prior examination. Redemonstrated pathologic wedge deformities of the T3 and T9 vertebral bodies. IMPRESSION: 1. Bulky hepatic metastatic disease significantly worsened compared to prior examination. 2. Redemonstrated widespread lytic osseous metastatic disease, not significantly changed compared to prior examination, notable for pathologic wedge deformities of the T3 and T9 vertebral bodies. 3. Status post cystoprostatectomy and right lower quadrant ileal conduit urinary diversion. 4. Bilateral hydronephrosis observed on prior examination is resolved. Electronically Signed   By: Eddie Candle M.D.   On: 03/06/2020 14:06   CT ABDOMEN PELVIS W CONTRAST  Result Date: 03/06/2020 CLINICAL DATA:  Bladder cancer, evaluate metastatic disease EXAM: CT CHEST, ABDOMEN, AND PELVIS WITH CONTRAST TECHNIQUE: Multidetector CT imaging of the chest, abdomen and pelvis was performed following the standard protocol during bolus administration  of intravenous contrast. CONTRAST:  62mL OMNIPAQUE IOHEXOL 300 MG/ML  SOLN COMPARISON:  12/23/2019 FINDINGS: CT CHEST FINDINGS Cardiovascular: Right chest port catheter. Normal heart size. No pericardial effusion. Mediastinum/Nodes: No enlarged mediastinal, hilar, or axillary lymph nodes. Moderate hiatal hernia with intrathoracic position of the gastric fundus. Thyroid gland, trachea, and esophagus demonstrate no significant findings. Lungs/Pleura: Scarring and or atelectasis of the bilateral lung bases and trace pleural effusions. No pleural effusion or pneumothorax. Musculoskeletal: No chest wall mass. CT ABDOMEN PELVIS FINDINGS Hepatobiliary: Redemonstrated bulky, hypodense hepatic metastatic disease, significantly worsened compared to prior examination, an index lesion in the posterior dome measuring 10.5 x 9.2 cm, previously 5.4 x 5.6 cm (series 3, image 47). No gallstones, gallbladder wall thickening, or biliary dilatation. Pancreas: Unremarkable. No pancreatic ductal dilatation or surrounding inflammatory changes. Spleen: Normal in size without significant abnormality. Adrenals/Urinary Tract: Adrenal glands are unremarkable. Kidneys are normal, without renal calculi or hydronephrosis. Status post cystoprostatectomy and right lower quadrant ileal conduit urinary diversion. Bilateral hydronephrosis observed on prior examination is resolved. Stomach/Bowel: Stomach is within normal limits. Appendix is not clearly visualized. No evidence of bowel wall thickening, distention, or inflammatory changes. Vascular/Lymphatic: No significant vascular findings are present. No enlarged abdominal or pelvic lymph nodes. Reproductive: Status post cystoprostatectomy. Other: No abdominal wall hernia or abnormality. No abdominopelvic ascites. Musculoskeletal: Redemonstrated widespread lytic osseous metastatic disease, not significantly changed compared to prior examination. Redemonstrated pathologic wedge deformities of the T3  and T9 vertebral bodies. IMPRESSION: 1. Bulky hepatic metastatic disease significantly worsened compared to prior examination. 2. Redemonstrated widespread lytic osseous metastatic disease, not significantly changed compared to prior examination, notable for pathologic wedge deformities of the T3 and T9 vertebral bodies. 3. Status post cystoprostatectomy and right lower quadrant ileal conduit urinary diversion. 4. Bilateral hydronephrosis observed on prior examination is resolved. Electronically Signed   By: Eddie Candle M.D.   On: 03/06/2020 14:06   DG Chest Port 1 View  Result Date: 03/06/2020 CLINICAL DATA:  Question sepsis.  Bladder cancer. EXAM: PORTABLE CHEST 1 VIEW COMPARISON:  11/10/2019 FINDINGS: Cardiac and mediastinal contours normal. Elevated right hemidiaphragm with minimal right lower lobe atelectasis. Remaining lungs are clear. No effusion. Port-A-Cath tip in the right atrium unchanged. Generator pack with lead in the left lower neck soft tissues unchanged from the prior study. IMPRESSION: No active disease. Electronically Signed   By: Franchot Gallo M.D.   On: 03/06/2020 11:39        Scheduled Meds: . dexamethasone (DECADRON) injection  4 mg Intravenous  Q6H   Continuous Infusions:   LOS: 0 days    Time spent: 35 minutes    Barb Merino, MD [facility] Pager [phone]

## 2020-03-07 NOTE — Progress Notes (Signed)
IP PROGRESS NOTE  Subjective:   Events overnight night noted.  [name] continues to have issues with changes in mentation and continues to be restless.  He does not appear to be hurting although no clear complaints or ascertain at this time.  Objective:  Vital signs in last 24 hours: Temp:  [95.7 F (35.4 C)-98.1 F (36.7 C)] 97.9 F (36.6 C) (11/09 2050) Pulse Rate:  [61-102] 80 (11/10 0442) Resp:  [12-23] 15 (11/10 0442) BP: (104-155)/(66-87) 104/70 (11/10 0442) SpO2:  [88 %-100 %] 98 % (11/10 0442) Weight change:     Intake/Output from previous day: 11/09 0701 - 11/10 0700 In: 100 [IV Piggyback:100] Out: 700 [Urine:700] General: Arousable but overall lethargic chronically ill appearing. Head: Normocephalic atraumatic. Mouth: mucous membranes moist, pharynx normal without lesions Eyes: No scleral icterus.  Pupils are equal and round reactive to light. Resp: clear to auscultation bilaterally without rhonchi or wheezes or dullness to percussion. Cardio: regular rate and rhythm, S1, S2 normal, no murmur, click, rub or gallop GI: soft, non-tender; bowel sounds normal; no masses,  no organomegaly Musculoskeletal: No joint deformity or effusion. Neurological: No motor, sensory deficits.  Intact deep tendon reflexes. Skin: No rashes or lesions.    Lab Results: Recent Labs    03/06/20 1110  WBC 6.3  HGB 9.0*  HCT 29.9*  PLT 196    BMET Recent Labs    03/06/20 1110  NA 134*  K 4.2  CL 106  CO2 21*  GLUCOSE 88  BUN 46*  CREATININE 1.82*  CALCIUM 10.5*    Studies/Results: CT Head W or Wo Contrast  Result Date: 03/06/2020 CLINICAL DATA:  Metastatic disease evaluation. EXAM: CT HEAD WITHOUT AND WITH CONTRAST TECHNIQUE: Contiguous axial images were obtained from the base of the skull through the vertex without and with intravenous contrast CONTRAST:  62mL OMNIPAQUE IOHEXOL 300 MG/ML  SOLN COMPARISON:  CT head July 31, 2019. FINDINGS: Brain: There are numerous  centrally hypodense, peripherally enhancing round lesions throughout the infratentorial and supratentorial brain. Many these of these lesions are located at the gray-white matter interface. There also are lesions in bilateral caudate. Index lesion in the posterior left cerebellum measures 1.2 by 0.8 cm on series 5, image 9. Index lesion within the inferior left frontal lobe measures 0.1 cm on series 5, image 14. Index lesion within the right frontal cortex measures 1.2 cm on series 5, image 17. Many of these lesions demonstrate surrounding vasogenic edema without substantial mass effect or midline shift. No hydrocephalus. Similar is lesions may have a small amount of intralesional hemorrhage, but otherwise no evidence of acute hemorrhage. Vascular: No hyperdense vessel or unexpected calcification. Visible vessels are patent. Skull: Normal. Negative for fracture or focal lesion. Sinuses/Orbits: No acute findings. Other: No mastoid effusions. IMPRESSION: Numerous infratentorial and supratentorial peripherally enhancing lesions, highly concerning for metastatic disease given the patient's clinical history. Many of the lesions have surrounding vasogenic edema without substantial mass effect or midline shift. If the patient is able, recommend MRI with and without contrast for more complete evaluation. Electronically Signed   By: Margaretha Sheffield MD   On: 03/06/2020 13:42   CT Chest W Contrast  Result Date: 03/06/2020 CLINICAL DATA:  Bladder cancer, evaluate metastatic disease EXAM: CT CHEST, ABDOMEN, AND PELVIS WITH CONTRAST TECHNIQUE: Multidetector CT imaging of the chest, abdomen and pelvis was performed following the standard protocol during bolus administration of intravenous contrast. CONTRAST:  45mL OMNIPAQUE IOHEXOL 300 MG/ML  SOLN COMPARISON:  12/23/2019 FINDINGS:  CT CHEST FINDINGS Cardiovascular: Right chest port catheter. Normal heart size. No pericardial effusion. Mediastinum/Nodes: No enlarged  mediastinal, hilar, or axillary lymph nodes. Moderate hiatal hernia with intrathoracic position of the gastric fundus. Thyroid gland, trachea, and esophagus demonstrate no significant findings. Lungs/Pleura: Scarring and or atelectasis of the bilateral lung bases and trace pleural effusions. No pleural effusion or pneumothorax. Musculoskeletal: No chest wall mass. CT ABDOMEN PELVIS FINDINGS Hepatobiliary: Redemonstrated bulky, hypodense hepatic metastatic disease, significantly worsened compared to prior examination, an index lesion in the posterior dome measuring 10.5 x 9.2 cm, previously 5.4 x 5.6 cm (series 3, image 47). No gallstones, gallbladder wall thickening, or biliary dilatation. Pancreas: Unremarkable. No pancreatic ductal dilatation or surrounding inflammatory changes. Spleen: Normal in size without significant abnormality. Adrenals/Urinary Tract: Adrenal glands are unremarkable. Kidneys are normal, without renal calculi or hydronephrosis. Status post cystoprostatectomy and right lower quadrant ileal conduit urinary diversion. Bilateral hydronephrosis observed on prior examination is resolved. Stomach/Bowel: Stomach is within normal limits. Appendix is not clearly visualized. No evidence of bowel wall thickening, distention, or inflammatory changes. Vascular/Lymphatic: No significant vascular findings are present. No enlarged abdominal or pelvic lymph nodes. Reproductive: Status post cystoprostatectomy. Other: No abdominal wall hernia or abnormality. No abdominopelvic ascites. Musculoskeletal: Redemonstrated widespread lytic osseous metastatic disease, not significantly changed compared to prior examination. Redemonstrated pathologic wedge deformities of the T3 and T9 vertebral bodies. IMPRESSION: 1. Bulky hepatic metastatic disease significantly worsened compared to prior examination. 2. Redemonstrated widespread lytic osseous metastatic disease, not significantly changed compared to prior examination,  notable for pathologic wedge deformities of the T3 and T9 vertebral bodies. 3. Status post cystoprostatectomy and right lower quadrant ileal conduit urinary diversion. 4. Bilateral hydronephrosis observed on prior examination is resolved. Electronically Signed   By: Eddie Candle M.D.   On: 03/06/2020 14:06   CT ABDOMEN PELVIS W CONTRAST  Result Date: 03/06/2020 CLINICAL DATA:  Bladder cancer, evaluate metastatic disease EXAM: CT CHEST, ABDOMEN, AND PELVIS WITH CONTRAST TECHNIQUE: Multidetector CT imaging of the chest, abdomen and pelvis was performed following the standard protocol during bolus administration of intravenous contrast. CONTRAST:  57mL OMNIPAQUE IOHEXOL 300 MG/ML  SOLN COMPARISON:  12/23/2019 FINDINGS: CT CHEST FINDINGS Cardiovascular: Right chest port catheter. Normal heart size. No pericardial effusion. Mediastinum/Nodes: No enlarged mediastinal, hilar, or axillary lymph nodes. Moderate hiatal hernia with intrathoracic position of the gastric fundus. Thyroid gland, trachea, and esophagus demonstrate no significant findings. Lungs/Pleura: Scarring and or atelectasis of the bilateral lung bases and trace pleural effusions. No pleural effusion or pneumothorax. Musculoskeletal: No chest wall mass. CT ABDOMEN PELVIS FINDINGS Hepatobiliary: Redemonstrated bulky, hypodense hepatic metastatic disease, significantly worsened compared to prior examination, an index lesion in the posterior dome measuring 10.5 x 9.2 cm, previously 5.4 x 5.6 cm (series 3, image 47). No gallstones, gallbladder wall thickening, or biliary dilatation. Pancreas: Unremarkable. No pancreatic ductal dilatation or surrounding inflammatory changes. Spleen: Normal in size without significant abnormality. Adrenals/Urinary Tract: Adrenal glands are unremarkable. Kidneys are normal, without renal calculi or hydronephrosis. Status post cystoprostatectomy and right lower quadrant ileal conduit urinary diversion. Bilateral hydronephrosis  observed on prior examination is resolved. Stomach/Bowel: Stomach is within normal limits. Appendix is not clearly visualized. No evidence of bowel wall thickening, distention, or inflammatory changes. Vascular/Lymphatic: No significant vascular findings are present. No enlarged abdominal or pelvic lymph nodes. Reproductive: Status post cystoprostatectomy. Other: No abdominal wall hernia or abnormality. No abdominopelvic ascites. Musculoskeletal: Redemonstrated widespread lytic osseous metastatic disease, not significantly changed compared to prior examination. Redemonstrated  pathologic wedge deformities of the T3 and T9 vertebral bodies. IMPRESSION: 1. Bulky hepatic metastatic disease significantly worsened compared to prior examination. 2. Redemonstrated widespread lytic osseous metastatic disease, not significantly changed compared to prior examination, notable for pathologic wedge deformities of the T3 and T9 vertebral bodies. 3. Status post cystoprostatectomy and right lower quadrant ileal conduit urinary diversion. 4. Bilateral hydronephrosis observed on prior examination is resolved. Electronically Signed   By: Eddie Candle M.D.   On: 03/06/2020 14:06   DG Chest Port 1 View  Result Date: 03/06/2020 CLINICAL DATA:  Question sepsis.  Bladder cancer. EXAM: PORTABLE CHEST 1 VIEW COMPARISON:  11/10/2019 FINDINGS: Cardiac and mediastinal contours normal. Elevated right hemidiaphragm with minimal right lower lobe atelectasis. Remaining lungs are clear. No effusion. Port-A-Cath tip in the right atrium unchanged. Generator pack with lead in the left lower neck soft tissues unchanged from the prior study. IMPRESSION: No active disease. Electronically Signed   By: Franchot Gallo M.D.   On: 03/06/2020 11:39    Medications: I have reviewed the patient's current medications.  Assessment/Plan:  74 year old man with:  1.  End-stage bladder cancer despite multiple treatments he has disease progression based on  imaging studies obtained yesterday.  Hepatic metastasis as well as brain mets in the setting of a very poor performance status likely indicating end-stage disease without any additional treatment warranted.  I recommend supportive management only and transitioning into hospice.  I do not recommend any further anticancer treatment.  2.  Brain metastasis: I do not think a whole brain radiation will offer much palliation at this time.  I recommend steroids only.  3.  Prognosis and disposition: Unfortunately he is actively dying from his cancer and would recommend either inpatient hospice or residential hospice if he is to be transferred.  We will continue to follow and offer any assistance.  25  minutes were dedicated to this visit.  50% of the time was face-to-face and the the time was spent on reviewing laboratory data, imaging studies, discussing treatment options, and answering questions regarding future plan.    LOS: 0 days   Zola Button 03/07/2020, 7:30 AM

## 2020-03-07 NOTE — Progress Notes (Signed)
Engineer, maintenance [facility]) Hospital Liaison note.     Received request from [place] for family interest in [facility]. Patient information has been forwarded to [facility] for review. [place] liaison spoke with wife, [name] and will follow up once eligibility and availability have been established.   A Please do not hesitate to call with questions.     Thank you,    Farrel Gordon, RN, [facility]       [place] (listed on [place] under [place])     [phone]

## 2020-03-07 NOTE — Progress Notes (Signed)
Manufacturing engineer [facility]) Hospital Liaison note.     Received request from [place], RN for family interest in [facility]. Chart reviewed and eligibility confirmed. Spoke with wife to confirm interest and explain services.  Patient can transfer to [facility] Thursday morning 03/08/20 pending completion of consents.  [facility] will notify TOC when registration paperwork has been completed.    RN please call report to [phone] prior to transfer.  Thank you,     Farrel Gordon, RN, CCM       [place] (listed on [place] under [facility])     [phone]

## 2020-03-07 NOTE — Progress Notes (Addendum)
Palliative Medicine RN Note: Consult order noted.  Reached out to Dr Sloan Leiter. Family has decided up on hospice; they just need to decide whether they want to go to a hospice facility or home. Suanne Marker, RNCM will offer choice to family and have a hospice liaison come speak to John Holt's family to discuss this choice.  At this time, GOC are clear, and comfort medications are in place. PMT will not see John Holt. If new needs arise, please call our office at [phone].  Marjie Skiff Kellyn Mansfield, RN, BSN, [facility] Palliative Medicine Team 03/07/2020 9:40 AM Office [phone]    ADDENDUM: [name] DNR is in ACP tab. [facility], aka [facility] ([facility] Palliative Note from May 2021 states they completed MOST form. Spoke with liaison Venia Carbon; she will check their system and fax a copy to me if they have it.  Marjie Skiff Kaedon Fanelli, RN, BSN, [facility] Palliative Medicine Team 03/07/2020 9:46 AM Office [phone]

## 2020-03-07 NOTE — Progress Notes (Signed)
Chaplain engaged in initial visit with John Holt and his wife, John Holt.  During visit, chaplain explained her role and offered support.  John Holt shared that she and John Holt have been married since 2007 and that they love traveling together.  There was a great amount of joy and love expressed about their union and time together.  She also shared that they both majored in [place].  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  Their family has [place] graduates. John Holt also shared some of John Holt's health journey and how hard it has been.  She noted that at first they gave John Holt about four months of survival time and now they're in the seventh month.  They decided to take a big trip with family to [place] to spend time together and enjoy what time John Holt has left.    Chaplain was intentional about asking how John Holt was doing as a caretaker and in being by his side as he endures much pain.  She noted that it has been hard but that she is doing well.  She expressed that John Holt has been in pain and that they are currently managing that better.  In learning about the plans for hospice, chaplain affirmed how hospice will make him more comfortable. John Holt shared that they thought about doing home hospice first, but now want to go through a hospice facility to make sure he is also well take care of throughout the night.    Chaplain offered prayer over John Holt and his wife.  Chaplain offered the ministries of presence, and listening.  Chaplain will follow-up.    03/07/20 1100  Clinical Encounter Type  Visited With Patient and family together  Visit Type Initial  Spiritual Encounters  Spiritual Needs Prayer;Emotional;Grief support

## 2020-03-07 NOTE — TOC Initial Note (Signed)
Transition of Care [facility]) - Initial/Assessment Note    Patient Details  Name: John Holt MRN: [id number] Date of Birth: 02/23/1946  Transition of Care [facility] CM/SW Contact:    [name], RN Phone Number: 03/07/2020, 9:42 AM  Clinical Narrative:                 tct-mary Humphrey Rolls with hospice of .  Notified of wishes by wife and patient.  Will call wife today and see if they are wanting inpatient or home hospice.  Expected Discharge Plan: [place]     Patient Goals and CMS Choice Patient states their goals for this hospitalization and ongoing recovery are:: to go home if I can CMS Medicare.gov Compare Post Acute Care list provided to:: Patient Choice offered to / list presented to : Spouse  Expected Discharge Plan and Services Expected Discharge Plan: Home w Hospice Care In-house Referral: Hospice / Palliative Care Discharge Planning Services: CM Consult Post Acute Care Choice: Hospice Living arrangements for the past 2 months: Single Family Home                                      Prior Living Arrangements/Services Living arrangements for the past 2 months: Single Family Home Lives with:: Spouse Patient language and need for interpreter reviewed:: Yes Do you feel safe going back to the place where you live?: Yes      Need for Family Participation in Patient Care: Yes (Comment) Care giver support system in place?: Yes (comment)   Criminal Activity/Legal Involvement Pertinent to Current Situation/Hospitalization: No - Comment as needed  Activities of Daily Living Home Assistive Devices/Equipment: Other (Comment), Eyeglasses, Cane (specify quad or straight) (urostomy supplies) ADL Screening (condition at time of admission) Patient's cognitive ability adequate to safely complete daily activities?: No Is the patient deaf or have difficulty hearing?: Yes Does the patient have difficulty seeing, even when wearing  glasses/contacts?: No Does the patient have difficulty concentrating, remembering, or making decisions?: Yes Patient able to express need for assistance with ADLs?: Yes Does the patient have difficulty dressing or bathing?: Yes Independently performs ADLs?: No Communication: Independent Dressing (OT): Needs assistance Is this a change from baseline?: Pre-admission baseline Grooming: Needs assistance Is this a change from baseline?: Pre-admission baseline Feeding: Independent Bathing: Needs assistance Is this a change from baseline?: Pre-admission baseline Toileting: Needs assistance Is this a change from baseline?: Pre-admission baseline In/Out Bed: Needs assistance Is this a change from baseline?: Pre-admission baseline Walks in Home: Needs assistance Is this a change from baseline?: Pre-admission baseline Does the patient have difficulty walking or climbing stairs?: Yes Weakness of Legs: Both Weakness of Arms/Hands: Both  Permission Sought/Granted                  Emotional Assessment Appearance:: Appears older than stated age Attitude/Demeanor/Rapport: Lethargic Affect (typically observed): Accepting Orientation: : Fluctuating Orientation (Suspected and/or reported Sundowners) Alcohol / Substance Use: Not Applicable Psych Involvement: No (comment)  Admission diagnosis:  Brain metastases ([place]) [C79.31] Carcinoma of bladder metastatic to liver (HCC) [C67.9, C78.7] Urinary tract infection with hematuria, site unspecified [N39.0, R31.9] AMS (altered mental status) [R41.82] Patient Active Problem List   Diagnosis Date Noted  . AMS (altered mental status) 03/06/2020  . Port-A-Cath in place 08/12/2019  . Goals of care, counseling/discussion 08/04/2019  . Pressure injury of skin 08/01/2019  . Seizure ([place]) 07/31/2019  .  Status post ileal conduit ([place]) 02/25/2019  . Bladder cancer ([place]) 02/25/2019  . Sinus bradycardia 10/26/2018  . Malignant neoplasm of posterior wall  of urinary bladder ([place]) 02/24/2017  . Benign prostatic hyperplasia with urinary obstruction 08/18/2016  . ED (erectile dysfunction) of organic origin 08/18/2016  . Focal epilepsy with impairment of consciousness, intractable ([place]) 04/30/2014  . Memory loss 04/30/2014  . Localization-related symptomatic epilepsy and epileptic syndromes with complex partial seizures, intractable, without status epilepticus ([place]) 08/11/2012   PCP:  [name], MD Pharmacy:   [facility] [place], [place] - [address] [place] [address] [place] [address] Phone: [phone] Fax: [phone]     Social Determinants of Health (SDOH) Interventions    Readmission Risk Interventions No flowsheet data found.

## 2020-03-08 LAB — URINE CULTURE: Culture: NO GROWTH

## 2020-03-08 NOTE — Progress Notes (Signed)
Manufacturing engineer [facility]) Hospital Liaison note.    Registration paperwork has been completed.  Please arrange transport.   RN,  please call report to [phone].  Please be sure the DNR form transports with the patient.   Thank you for the opportunity to participate in this patient's care.  Chrislyn Edison Pace, BSN, RN [place] (listed on [place] under [facility])    [phone]

## 2020-03-08 NOTE — Discharge Summary (Signed)
Physician Discharge Summary  John Holt LPF:[id number] DOB: 01-28-1946 DOA: 03/06/2020  PCP: [name], MD  Admit date: 03/06/2020 Discharge date: 03/08/2020  Admitted From: home  Disposition:  Hospice home   Recommendations for Outpatient Follow-up:  1. As per hospice plan   Home Health:NA  Equipment/Devices:NA   Discharge Condition: serious   CODE STATUS:DNR/ comfort acre  Diet recommendation: comfort care   Discharge Summary: 74 year old gentleman with history of recurrent bladder cancer, seizure disorder,recently worsening health status presented to the ER with worsening back pain, difficulty with word finding.  Patient had become increasingly confused. In the emergency room hemodynamically stable.  Patient was found to have widely metastatic bladder cancer with numerous brain lesions in his spine lesions.  End-stage bladder cancer despite multiple treatments, hepatic metastasis, brain mets, poor functional status Altered mental status, acute metabolic encephalopathy Failure to thrive Seizure disorder  Plan: Patient not doing well despite maximum medical therapy and cancer treatment.  End-of-life care and hospice level of care started in the hospital. Transfer to inpatient hospice today to continue to provide end-of-life care. All symptom control medications are available. Medicate with pain medications and benzodiazepine before transport. He can continue his long-term seizure medications and benzodiazepines as much as he can take by mouth. Rest of the management will be as per inpatient hospice provider.   Discharge Diagnoses:  Active Problems:   AMS (altered mental status)   End of life care    Discharge Instructions  Discharge Instructions    Diet general   Complete by: As directed    Comfort diet   Increase activity slowly   Complete by: As directed      Allergies as of 03/08/2020      Reactions   Chlorhexidine Rash   Full body   Betadine  [povidone Iodine] Rash      Medication List    STOP taking these medications   ciprofloxacin 250 MG tablet Commonly known as: Cipro   furosemide 20 MG tablet Commonly known as: LASIX   PreviDent 5000 Booster Plus 1.1 % Pste Generic drug: Sodium Fluoride   sulfamethoxazole-trimethoprim 800-160 MG tablet Commonly known as: BACTRIM DS   traMADol 50 MG tablet Commonly known as: ULTRAM     TAKE these medications   cloBAZam 10 MG tablet Commonly known as: ONFI Take 1 tablet every night What changed:   how much to take  how to take this  when to take this  additional instructions   clonazePAM 0.5 MG tablet Commonly known as: KLONOPIN Take 1 tablet as needed for seizure cluster. Do not take more than 2-3 a week What changed:   how much to take  how to take this  when to take this  reasons to take this  additional instructions   ibuprofen 200 MG tablet Commonly known as: ADVIL Take 800 mg by mouth 3 (three) times daily as needed for moderate pain.   levothyroxine 137 MCG tablet Commonly known as: SYNTHROID Take 137 mcg by mouth every morning.   multivitamin with minerals Tabs tablet Take 1 tablet by mouth daily.   polyethylene glycol 17 g packet Commonly known as: MIRALAX / GLYCOLAX Take 17 g by mouth daily. What changed:   when to take this  reasons to take this   prochlorperazine 10 MG tablet Commonly known as: COMPAZINE Take 1 tablet (10 mg total) by mouth every 6 (six) hours as needed for nausea or vomiting.   senna 8.6 MG Tabs tablet Commonly known as:  SENOKOT Take 2 tablets (17.2 mg total) by mouth 2 (two) times daily.   zonisamide 100 MG capsule Commonly known as: ZONEGRAN TAKE 3 CAPSULES EACH NIGHT What changed:   how much to take  how to take this  when to take this  additional instructions       Allergies  Allergen Reactions  . Chlorhexidine Rash    Full body  . Betadine [Povidone Iodine] Rash     Consultations:  Hospice   Procedures/Studies: CT Head W or Wo Contrast  Result Date: 03/06/2020 CLINICAL DATA:  Metastatic disease evaluation. EXAM: CT HEAD WITHOUT AND WITH CONTRAST TECHNIQUE: Contiguous axial images were obtained from the base of the skull through the vertex without and with intravenous contrast CONTRAST:  64mL OMNIPAQUE IOHEXOL 300 MG/ML  SOLN COMPARISON:  CT head July 31, 2019. FINDINGS: Brain: There are numerous centrally hypodense, peripherally enhancing round lesions throughout the infratentorial and supratentorial brain. Many these of these lesions are located at the gray-white matter interface. There also are lesions in bilateral caudate. Index lesion in the posterior left cerebellum measures 1.2 by 0.8 cm on series 5, image 9. Index lesion within the inferior left frontal lobe measures 0.1 cm on series 5, image 14. Index lesion within the right frontal cortex measures 1.2 cm on series 5, image 17. Many of these lesions demonstrate surrounding vasogenic edema without substantial mass effect or midline shift. No hydrocephalus. Similar is lesions may have a small amount of intralesional hemorrhage, but otherwise no evidence of acute hemorrhage. Vascular: No hyperdense vessel or unexpected calcification. Visible vessels are patent. Skull: Normal. Negative for fracture or focal lesion. Sinuses/Orbits: No acute findings. Other: No mastoid effusions. IMPRESSION: Numerous infratentorial and supratentorial peripherally enhancing lesions, highly concerning for metastatic disease given the patient's clinical history. Many of the lesions have surrounding vasogenic edema without substantial mass effect or midline shift. If the patient is able, recommend MRI with and without contrast for more complete evaluation. Electronically Signed   By: Margaretha Sheffield MD   On: 03/06/2020 13:42   CT Chest W Contrast  Result Date: 03/06/2020 CLINICAL DATA:  Bladder cancer, evaluate metastatic  disease EXAM: CT CHEST, ABDOMEN, AND PELVIS WITH CONTRAST TECHNIQUE: Multidetector CT imaging of the chest, abdomen and pelvis was performed following the standard protocol during bolus administration of intravenous contrast. CONTRAST:  42mL OMNIPAQUE IOHEXOL 300 MG/ML  SOLN COMPARISON:  12/23/2019 FINDINGS: CT CHEST FINDINGS Cardiovascular: Right chest port catheter. Normal heart size. No pericardial effusion. Mediastinum/Nodes: No enlarged mediastinal, hilar, or axillary lymph nodes. Moderate hiatal hernia with intrathoracic position of the gastric fundus. Thyroid gland, trachea, and esophagus demonstrate no significant findings. Lungs/Pleura: Scarring and or atelectasis of the bilateral lung bases and trace pleural effusions. No pleural effusion or pneumothorax. Musculoskeletal: No chest wall mass. CT ABDOMEN PELVIS FINDINGS Hepatobiliary: Redemonstrated bulky, hypodense hepatic metastatic disease, significantly worsened compared to prior examination, an index lesion in the posterior dome measuring 10.5 x 9.2 cm, previously 5.4 x 5.6 cm (series 3, image 47). No gallstones, gallbladder wall thickening, or biliary dilatation. Pancreas: Unremarkable. No pancreatic ductal dilatation or surrounding inflammatory changes. Spleen: Normal in size without significant abnormality. Adrenals/Urinary Tract: Adrenal glands are unremarkable. Kidneys are normal, without renal calculi or hydronephrosis. Status post cystoprostatectomy and right lower quadrant ileal conduit urinary diversion. Bilateral hydronephrosis observed on prior examination is resolved. Stomach/Bowel: Stomach is within normal limits. Appendix is not clearly visualized. No evidence of bowel wall thickening, distention, or inflammatory changes. Vascular/Lymphatic: No significant vascular  findings are present. No enlarged abdominal or pelvic lymph nodes. Reproductive: Status post cystoprostatectomy. Other: No abdominal wall hernia or abnormality. No  abdominopelvic ascites. Musculoskeletal: Redemonstrated widespread lytic osseous metastatic disease, not significantly changed compared to prior examination. Redemonstrated pathologic wedge deformities of the T3 and T9 vertebral bodies. IMPRESSION: 1. Bulky hepatic metastatic disease significantly worsened compared to prior examination. 2. Redemonstrated widespread lytic osseous metastatic disease, not significantly changed compared to prior examination, notable for pathologic wedge deformities of the T3 and T9 vertebral bodies. 3. Status post cystoprostatectomy and right lower quadrant ileal conduit urinary diversion. 4. Bilateral hydronephrosis observed on prior examination is resolved. Electronically Signed   By: Eddie Candle M.D.   On: 03/06/2020 14:06   CT ABDOMEN PELVIS W CONTRAST  Result Date: 03/06/2020 CLINICAL DATA:  Bladder cancer, evaluate metastatic disease EXAM: CT CHEST, ABDOMEN, AND PELVIS WITH CONTRAST TECHNIQUE: Multidetector CT imaging of the chest, abdomen and pelvis was performed following the standard protocol during bolus administration of intravenous contrast. CONTRAST:  49mL OMNIPAQUE IOHEXOL 300 MG/ML  SOLN COMPARISON:  12/23/2019 FINDINGS: CT CHEST FINDINGS Cardiovascular: Right chest port catheter. Normal heart size. No pericardial effusion. Mediastinum/Nodes: No enlarged mediastinal, hilar, or axillary lymph nodes. Moderate hiatal hernia with intrathoracic position of the gastric fundus. Thyroid gland, trachea, and esophagus demonstrate no significant findings. Lungs/Pleura: Scarring and or atelectasis of the bilateral lung bases and trace pleural effusions. No pleural effusion or pneumothorax. Musculoskeletal: No chest wall mass. CT ABDOMEN PELVIS FINDINGS Hepatobiliary: Redemonstrated bulky, hypodense hepatic metastatic disease, significantly worsened compared to prior examination, an index lesion in the posterior dome measuring 10.5 x 9.2 cm, previously 5.4 x 5.6 cm (series 3,  image 47). No gallstones, gallbladder wall thickening, or biliary dilatation. Pancreas: Unremarkable. No pancreatic ductal dilatation or surrounding inflammatory changes. Spleen: Normal in size without significant abnormality. Adrenals/Urinary Tract: Adrenal glands are unremarkable. Kidneys are normal, without renal calculi or hydronephrosis. Status post cystoprostatectomy and right lower quadrant ileal conduit urinary diversion. Bilateral hydronephrosis observed on prior examination is resolved. Stomach/Bowel: Stomach is within normal limits. Appendix is not clearly visualized. No evidence of bowel wall thickening, distention, or inflammatory changes. Vascular/Lymphatic: No significant vascular findings are present. No enlarged abdominal or pelvic lymph nodes. Reproductive: Status post cystoprostatectomy. Other: No abdominal wall hernia or abnormality. No abdominopelvic ascites. Musculoskeletal: Redemonstrated widespread lytic osseous metastatic disease, not significantly changed compared to prior examination. Redemonstrated pathologic wedge deformities of the T3 and T9 vertebral bodies. IMPRESSION: 1. Bulky hepatic metastatic disease significantly worsened compared to prior examination. 2. Redemonstrated widespread lytic osseous metastatic disease, not significantly changed compared to prior examination, notable for pathologic wedge deformities of the T3 and T9 vertebral bodies. 3. Status post cystoprostatectomy and right lower quadrant ileal conduit urinary diversion. 4. Bilateral hydronephrosis observed on prior examination is resolved. Electronically Signed   By: Eddie Candle M.D.   On: 03/06/2020 14:06   DG Chest Port 1 View  Result Date: 03/06/2020 CLINICAL DATA:  Question sepsis.  Bladder cancer. EXAM: PORTABLE CHEST 1 VIEW COMPARISON:  11/10/2019 FINDINGS: Cardiac and mediastinal contours normal. Elevated right hemidiaphragm with minimal right lower lobe atelectasis. Remaining lungs are clear. No  effusion. Port-A-Cath tip in the right atrium unchanged. Generator pack with lead in the left lower neck soft tissues unchanged from the prior study. IMPRESSION: No active disease. Electronically Signed   By: Franchot Gallo M.D.   On: 03/06/2020 11:39    (Echo, Carotid, EGD, Colonoscopy, ERCP)    Subjective: Patient seen and  examined.  He was agitated early morning and was given benzodiazepines and morphine and he was comfortably sleepy.   Discharge Exam: Vitals:   03/07/20 1401 03/07/20 2111  BP: 122/65 (!) 100/57  Pulse: 75 61  Resp: 18 14  Temp: 98.1 F (36.7 C) 98.3 F (36.8 C)  SpO2:  99%   Vitals:   03/07/20 0130 03/07/20 0442 03/07/20 1401 03/07/20 2111  BP: 105/67 104/70 122/65 (!) 100/57  Pulse: 68 80 75 61  Resp: 17 15 18 14   Temp:   98.1 F (36.7 C) 98.3 F (36.8 C)  TempSrc:   Oral Axillary  SpO2: 98% 98%  99%    General: Patient looks very debilitated, he is currently sleepy and looks comfortable on room air. Cardiovascular: RRR, S1/S2 +, no rubs, no gallops tachycardic. Respiratory: CTA bilaterally, no wheezing, no rhonchi, conducted airway sounds. Abdominal: Soft, NT, ND, bowel sounds +, urostomy present. Extremities: no edema, no cyanosis    The results of significant diagnostics from this hospitalization (including imaging, microbiology, ancillary and laboratory) are listed below for reference.     Microbiology: Recent Results (from the past 240 hour(s))  Urine Culture     Status: Abnormal   Collection Time: 03/02/20  1:16 PM   Specimen: Urine, Clean Catch  Result Value Ref Range Status   Specimen Description   Final    URINE, CLEAN CATCH Performed at [facility], [address] [place], [place] [address]    Special Requests   Final    NONE Performed at [facility], [place] [address], [place], [place] [address]    Culture >=100,000 COLONIES/mL KLEBSIELLA PNEUMONIAE (A)  Final   Report Status  03/05/2020 FINAL  Final   Organism ID, Bacteria KLEBSIELLA PNEUMONIAE (A)  Final      Susceptibility   Klebsiella pneumoniae - MIC*    AMPICILLIN >=32 RESISTANT Resistant     CEFAZOLIN <=4 SENSITIVE Sensitive     CEFEPIME <=0.12 SENSITIVE Sensitive     CEFTRIAXONE <=0.25 SENSITIVE Sensitive     CIPROFLOXACIN <=0.25 SENSITIVE Sensitive     GENTAMICIN <=1 SENSITIVE Sensitive     IMIPENEM <=0.25 SENSITIVE Sensitive     NITROFURANTOIN 32 SENSITIVE Sensitive     TRIMETH/SULFA <=20 SENSITIVE Sensitive     AMPICILLIN/SULBACTAM 4 SENSITIVE Sensitive     PIP/TAZO <=4 SENSITIVE Sensitive     * >=100,000 COLONIES/mL KLEBSIELLA PNEUMONIAE  Blood Culture (routine x 2)     Status: None (Preliminary result)   Collection Time: 03/06/20 11:10 AM   Specimen: BLOOD  Result Value Ref Range Status   Specimen Description   Final    BLOOD SITE NOT SPECIFIED Performed at [facility], [address], [place], [place] [address]    Special Requests   Final    BOTTLES DRAWN AEROBIC AND ANAEROBIC Blood Culture adequate volume Performed at [place] [address] [place], [place] [address]    Culture   Final    NO GROWTH 2 DAYS Performed at [facility] [address] [place], [place] [address]    Report Status PENDING  Incomplete  Blood Culture (routine x 2)     Status: None (Preliminary result)   Collection Time: 03/06/20 11:25 AM   Specimen: BLOOD  Result Value Ref Range Status   Specimen Description   Final    BLOOD SITE NOT SPECIFIED Performed at [facility] [address], [place], [place]  [address]    Special Requests   Final    BOTTLES DRAWN AEROBIC AND ANAEROBIC Blood Culture adequate volume Performed at [facility] [address], [place], [place] [address]    Culture   Final    NO GROWTH 2 DAYS Performed at [facility] [address], [place], [place] [address]    Report Status PENDING  Incomplete  Respiratory  Panel by RT PCR (Flu A&B, Covid) - Nasopharyngeal Swab     Status: None   Collection Time: 03/06/20  4:50 PM   Specimen: Nasopharyngeal Swab  Result Value Ref Range Status   SARS Coronavirus 2 by RT PCR NEGATIVE NEGATIVE Final    Comment: (NOTE) SARS-CoV-2 target nucleic acids are NOT DETECTED.  The SARS-CoV-2 RNA is generally detectable in upper respiratoy specimens during the acute phase of infection. The lowest concentration of SARS-CoV-2 viral copies this assay can detect is 131 copies/mL. A negative result does not preclude SARS-Cov-2 infection and should not be used as the sole basis for treatment or other patient management decisions. A negative result may occur with  improper specimen collection/handling, submission of specimen other than nasopharyngeal swab, presence of viral mutation(s) within the areas targeted by this assay, and inadequate number of viral copies (<131 copies/mL). A negative result must be combined with clinical observations, patient history, and epidemiological information. The expected result is Negative.  Fact Sheet for Patients:  PinkCheek.be  Fact Sheet for Healthcare Providers:  GravelBags.it  This test is no t yet approved or cleared by the [place] FDA and  has been authorized for detection and/or diagnosis of SARS-CoV-2 by FDA under an Emergency Use Authorization (EUA). This EUA will remain  in effect (meaning this test can be used) for the duration of the COVID-19 declaration under Section 564(b)(1) of the Act, 21 U.S.C. section 360bbb-3(b)(1), unless the authorization is terminated or revoked sooner.     Influenza A by PCR NEGATIVE NEGATIVE Final   Influenza B by PCR NEGATIVE NEGATIVE Final    Comment: (NOTE) The Xpert Xpress SARS-CoV-2/FLU/RSV assay is intended as an aid in  the diagnosis of influenza from Nasopharyngeal swab specimens and  should not be used as a sole basis  for treatment. Nasal washings and  aspirates are unacceptable for Xpert Xpress SARS-CoV-2/FLU/RSV  testing.  Fact Sheet for Patients: PinkCheek.be  Fact Sheet for Healthcare Providers: GravelBags.it  This test is not yet approved or cleared by the [place] FDA and  has been authorized for detection and/or diagnosis of SARS-CoV-2 by  FDA under an Emergency Use Authorization (EUA). This EUA will remain  in effect (meaning this test can be used) for the duration of the  Covid-19 declaration under Section 564(b)(1) of the Act, 21  U.S.C. section 360bbb-3(b)(1), unless the authorization is  terminated or revoked. Performed at [facility], [place] [address], [place], [place] [address]   Urine culture     Status: None   Collection Time: 03/06/20  5:53 PM   Specimen: In/Out Cath Urine  Result Value Ref Range Status   Specimen Description   Final    IN/OUT CATH URINE Performed at [facility] [address], [place], [place] [address]    Special Requests   Final    Immunocompromised Performed at [facility], [place] [address], [place], [place] [address]    Culture   Final    NO GROWTH Performed at [facility], [place]  [address] [place], [place] [address]    Report Status 03/08/2020 FINAL  Final     Labs: BNP (last 3 results) No results for input(s): BNP in the last 8760 hours. Basic Metabolic Panel: Recent Labs  Lab 03/02/20 1322 03/06/20 1110  NA 136 134*  K 4.3 4.2  CL 108 106  CO2 20* 21*  GLUCOSE 82 88  BUN 46* 46*  CREATININE 2.06* 1.82*  CALCIUM 9.9 10.5*   Liver Function Tests: Recent Labs  Lab 03/02/20 1322 03/06/20 1110  AST 56* 66*  ALT 29 27  ALKPHOS 270* 246*  BILITOT 0.2* 0.4  PROT 6.7 6.6  ALBUMIN 3.1* 3.1*   No results for input(s): LIPASE, AMYLASE in the last 168 hours. Recent Labs  Lab 03/06/20 1110  AMMONIA 31    CBC: Recent Labs  Lab 03/02/20 1322 03/06/20 1110  WBC 6.2 6.3  NEUTROABS 3.8 4.5  HGB 9.9* 9.0*  HCT 32.5* 29.9*  MCV 80.6 82.1  PLT 270 196   Cardiac Enzymes: No results for input(s): CKTOTAL, CKMB, CKMBINDEX, TROPONINI in the last 168 hours. BNP: Invalid input(s): POCBNP CBG: No results for input(s): GLUCAP in the last 168 hours. D-Dimer No results for input(s): DDIMER in the last 72 hours. Hgb A1c No results for input(s): HGBA1C in the last 72 hours. Lipid Profile No results for input(s): CHOL, HDL, LDLCALC, TRIG, CHOLHDL, LDLDIRECT in the last 72 hours. Thyroid function studies No results for input(s): TSH, T4TOTAL, T3FREE, THYROIDAB in the last 72 hours.  Invalid input(s): FREET3 Anemia work up No results for input(s): VITAMINB12, FOLATE, FERRITIN, TIBC, IRON, RETICCTPCT in the last 72 hours. Urinalysis    Component Value Date/Time   COLORURINE YELLOW 03/06/2020 1753   APPEARANCEUR CLEAR 03/06/2020 1753   LABSPEC 1.015 03/06/2020 1753   PHURINE 6.0 03/06/2020 1753   GLUCOSEU NEGATIVE 03/06/2020 1753   HGBUR MODERATE (A) 03/06/2020 1753   BILIRUBINUR NEGATIVE 03/06/2020 1753   KETONESUR NEGATIVE 03/06/2020 1753   PROTEINUR 30 (A) 03/06/2020 1753   UROBILINOGEN 0.2 06/11/2010 1135   NITRITE NEGATIVE 03/06/2020 1753   LEUKOCYTESUR MODERATE (A) 03/06/2020 1753   Sepsis Labs Invalid input(s): PROCALCITONIN,  WBC,  LACTICIDVEN Microbiology Recent Results (from the past 240 hour(s))  Urine Culture     Status: Abnormal   Collection Time: 03/02/20  1:16 PM   Specimen: Urine, Clean Catch  Result Value Ref Range Status   Specimen Description   Final    URINE, CLEAN CATCH Performed at [facility], [place] [address] [place], [place] [address]    Special Requests   Final    NONE Performed at [facility], [place] [address], [place], [place] [address]    Culture >=100,000 COLONIES/mL KLEBSIELLA PNEUMONIAE (A)   Final   Report Status 03/05/2020 FINAL  Final   Organism ID, Bacteria KLEBSIELLA PNEUMONIAE (A)  Final      Susceptibility   Klebsiella pneumoniae - MIC*    AMPICILLIN >=32 RESISTANT Resistant     CEFAZOLIN <=4 SENSITIVE Sensitive     CEFEPIME <=0.12 SENSITIVE Sensitive     CEFTRIAXONE <=0.25 SENSITIVE Sensitive     CIPROFLOXACIN <=0.25 SENSITIVE Sensitive     GENTAMICIN <=1 SENSITIVE Sensitive     IMIPENEM <=0.25 SENSITIVE Sensitive     NITROFURANTOIN 32 SENSITIVE Sensitive     TRIMETH/SULFA <=20 SENSITIVE Sensitive     AMPICILLIN/SULBACTAM 4 SENSITIVE Sensitive     PIP/TAZO <=4 SENSITIVE Sensitive     * >=100,000 COLONIES/mL KLEBSIELLA  PNEUMONIAE  Blood Culture (routine x 2)     Status: None (Preliminary result)   Collection Time: 03/06/20 11:10 AM   Specimen: BLOOD  Result Value Ref Range Status   Specimen Description   Final    BLOOD SITE NOT SPECIFIED Performed at [facility], [address] [place], [place] [address]    Special Requests   Final    BOTTLES DRAWN AEROBIC AND ANAEROBIC Blood Culture adequate volume Performed at [place] [address], [place], [place] [address]    Culture   Final    NO GROWTH 2 DAYS Performed at [place] [address], [place], [place] [address]    Report Status PENDING  Incomplete  Blood Culture (routine x 2)     Status: None (Preliminary result)   Collection Time: 03/06/20 11:25 AM   Specimen: BLOOD  Result Value Ref Range Status   Specimen Description   Final    BLOOD SITE NOT SPECIFIED Performed at [facility] [address] [place], [place] [address]    Special Requests   Final    BOTTLES DRAWN AEROBIC AND ANAEROBIC Blood Culture adequate volume Performed at [facility] [address] [place], [place] [address]    Culture   Final    NO GROWTH 2 DAYS Performed at [facility] [address], [place], [place] [address]    Report Status PENDING   Incomplete  Respiratory Panel by RT PCR (Flu A&B, Covid) - Nasopharyngeal Swab     Status: None   Collection Time: 03/06/20  4:50 PM   Specimen: Nasopharyngeal Swab  Result Value Ref Range Status   SARS Coronavirus 2 by RT PCR NEGATIVE NEGATIVE Final    Comment: (NOTE) SARS-CoV-2 target nucleic acids are NOT DETECTED.  The SARS-CoV-2 RNA is generally detectable in upper respiratoy specimens during the acute phase of infection. The lowest concentration of SARS-CoV-2 viral copies this assay can detect is 131 copies/mL. A negative result does not preclude SARS-Cov-2 infection and should not be used as the sole basis for treatment or other patient management decisions. A negative result may occur with  improper specimen collection/handling, submission of specimen other than nasopharyngeal swab, presence of viral mutation(s) within the areas targeted by this assay, and inadequate number of viral copies (<131 copies/mL). A negative result must be combined with clinical observations, patient history, and epidemiological information. The expected result is Negative.  Fact Sheet for Patients:  PinkCheek.be  Fact Sheet for Healthcare Providers:  GravelBags.it  This test is no t yet approved or cleared by the [place] FDA and  has been authorized for detection and/or diagnosis of SARS-CoV-2 by FDA under an Emergency Use Authorization (EUA). This EUA will remain  in effect (meaning this test can be used) for the duration of the COVID-19 declaration under Section 564(b)(1) of the Act, 21 U.S.C. section 360bbb-3(b)(1), unless the authorization is terminated or revoked sooner.     Influenza A by PCR NEGATIVE NEGATIVE Final   Influenza B by PCR NEGATIVE NEGATIVE Final    Comment: (NOTE) The Xpert Xpress SARS-CoV-2/FLU/RSV assay is intended as an aid in  the diagnosis of influenza from Nasopharyngeal swab specimens and  should not  be used as a sole basis for treatment. Nasal washings and  aspirates are unacceptable for Xpert Xpress SARS-CoV-2/FLU/RSV  testing.  Fact Sheet for Patients: PinkCheek.be  Fact Sheet for Healthcare Providers: GravelBags.it  This test is not  yet approved or cleared by the [place] and  has been authorized for detection and/or diagnosis of SARS-CoV-2 by  FDA under an Emergency Use Authorization (EUA). This EUA will remain  in effect (meaning this test can be used) for the duration of the  Covid-19 declaration under Section 564(b)(1) of the Act, 21  U.S.C. section 360bbb-3(b)(1), unless the authorization is  terminated or revoked. Performed at [facility], [place] [address], [place], [place] [address]   Urine culture     Status: None   Collection Time: 03/06/20  5:53 PM   Specimen: In/Out Cath Urine  Result Value Ref Range Status   Specimen Description   Final    IN/OUT CATH URINE Performed at [facility] [address], [place], [place] [address]    Special Requests   Final    Immunocompromised Performed at [facility], [place] [address], [place], [place] [address]    Culture   Final    NO GROWTH Performed at [facility], [place] [address] [place], [place] [address]    Report Status 03/08/2020 FINAL  Final     Time coordinating discharge:  35 minutes  SIGNED:   Barb Merino, MD  [facility] 03/08/2020, 11:40 AM

## 2020-03-08 NOTE — TOC Transition Note (Addendum)
Transition of Care [facility]) - CM/SW Discharge Note   Patient Details  Name: John Holt MRN: [id number] Date of Birth: [birth year]  Transition of Care [facility]) CM/SW Contact:  [name], RN Phone Number: 03/08/2020, 10:31 AM   Clinical Narrative:    Patient being transferred to [facility]. [facility] CALLED AT 1045 FOR TRANSPORT.  PACKET WITH MED NECESSITY FORM AND MOST FORM TO FLOOR RN.  Final next level of care: [place] Barriers to Discharge: No Barriers Identified   Patient Goals and CMS Choice Patient states their goals for this hospitalization and ongoing recovery are:: to go home if I can CMS Medicare.gov Compare Post Acute Care list provided to:: Patient Choice offered to / list presented to : Spouse  Discharge Placement                       Discharge Plan and Services In-house Referral: Hospice / Palliative Care Discharge Planning Services: CM Consult Post Acute Care Choice: Hospice                               Social Determinants of Health (SDOH) Interventions     Readmission Risk Interventions No flowsheet data found.

## 2020-03-08 NOTE — Progress Notes (Signed)
Report called to [facility] and given to Marye Round, RN. Questions answered.

## 2020-03-08 NOTE — Progress Notes (Signed)
[facility] here to transport pt to [facility]. Packet provided to be given to facility.

## 2020-03-09 ENCOUNTER — Other Ambulatory Visit: Payer: Medicare PPO

## 2020-03-09 ENCOUNTER — Ambulatory Visit: Payer: Medicare PPO

## 2020-03-09 ENCOUNTER — Ambulatory Visit: Payer: Medicare PPO | Admitting: Oncology

## 2020-03-11 LAB — CULTURE, BLOOD (ROUTINE X 2)
Culture: NO GROWTH
Culture: NO GROWTH
Special Requests: ADEQUATE
Special Requests: ADEQUATE

## 2020-03-15 ENCOUNTER — Ambulatory Visit (HOSPITAL_COMMUNITY): Payer: Medicare PPO

## 2020-03-23 ENCOUNTER — Ambulatory Visit: Payer: Medicare PPO

## 2020-03-23 ENCOUNTER — Other Ambulatory Visit: Payer: Medicare PPO

## 2020-03-28 DEATH — deceased

## 2020-09-20 ENCOUNTER — Ambulatory Visit: Payer: Medicare PPO | Admitting: Neurology

## 2022-02-20 IMAGING — CT CT ABD-PELV W/ CM
3 of 8 series · 14 of 46 positions shown, 15 images · IV contrast (OMNIPAQUE)
Comparison: Abdomen/pelvis CT [HOSPITAL]
07/28/2019. Abdomen/pelvis CT 12/19/2016

CLINICAL DATA: Bladder cancer. Status post cysto prostatectomy with
urinary diversion.

EXAM:
CT CHEST, ABDOMEN, AND PELVIS WITH CONTRAST
TECHNIQUE: Multidetector CT imaging of the chest, abdomen and pelvis was
performed following the standard protocol during bolus
administration of intravenous contrast.
CONTRAST:  100mL OMNIPAQUE IOHEXOL 300 MG/ML  SOLN

[Series 2: axial post · axial · 0.73mm/px · z∈[-601,-501]mm · 3 of 79 slices shown]
[im 10/79  soft-tissue]
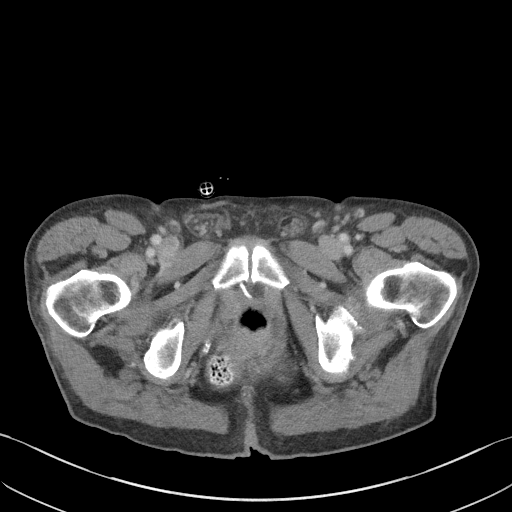
[im 20/79  soft-tissue]
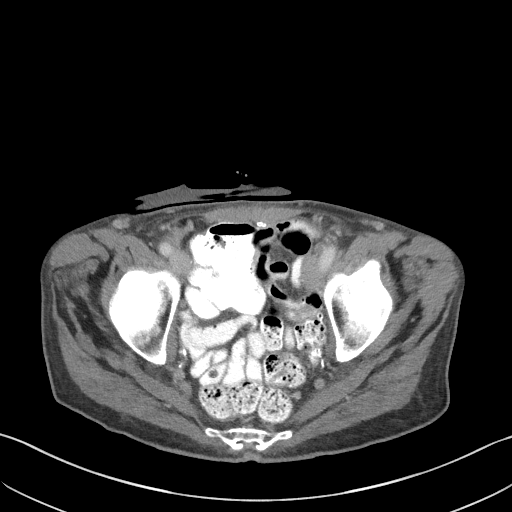
[im 30/79  soft-tissue]
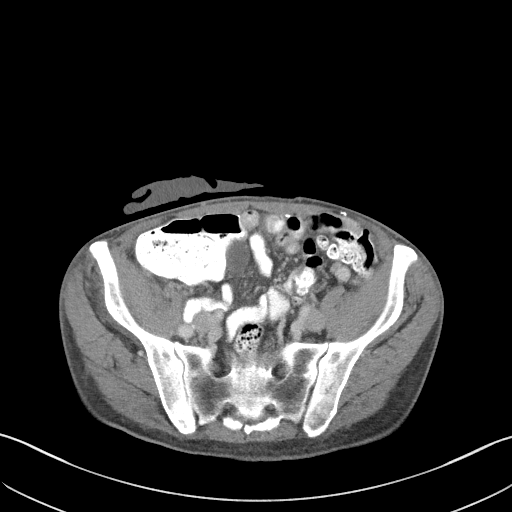

[Series 5: coronal post · coronal · 0.63mm/px · 3 of 83 slices shown]
[im 21/83  soft-tissue]
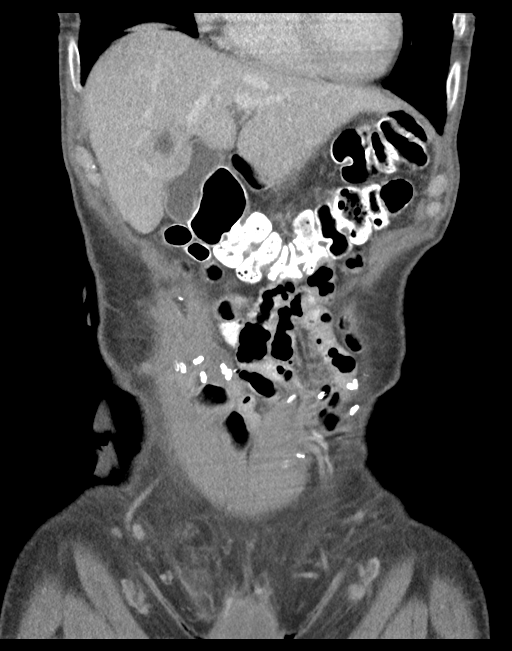
[im 42/83  soft-tissue]
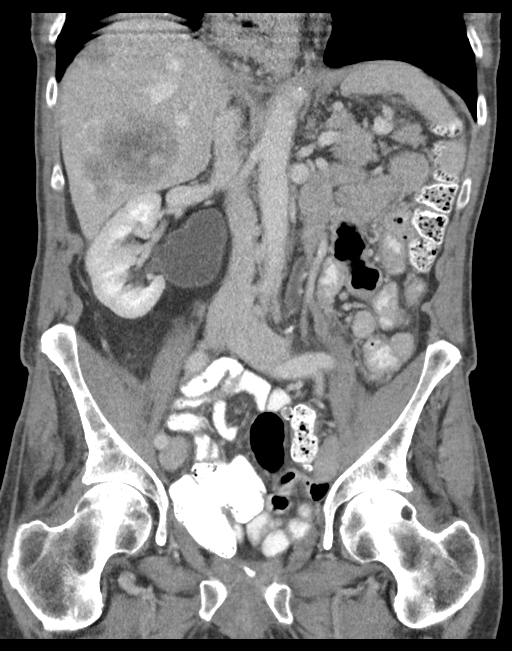
[im 62/83  soft-tissue]
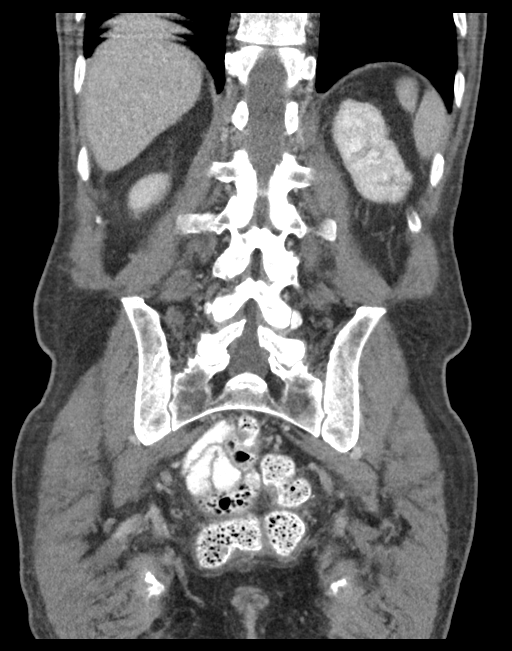

[Series 8: axial delay · axial · delayed · 0.66mm/px · z∈[-431,-81]mm · 8 of 90 slices shown, 9 images]
[im 10/90  soft-tissue]
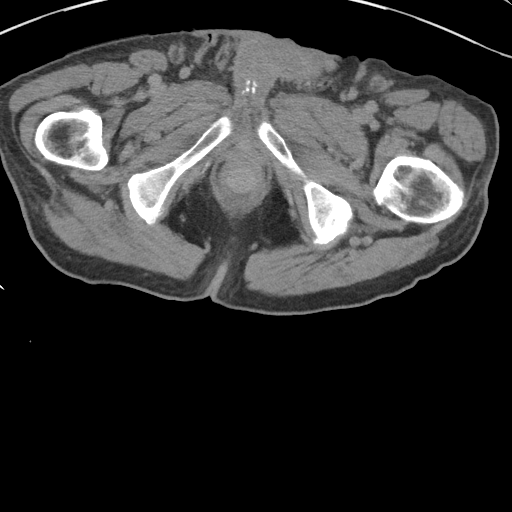
[im 10/90  bone]
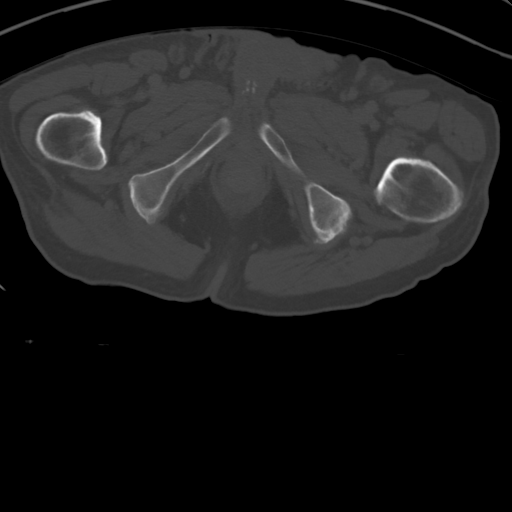
[im 20/90  soft-tissue]
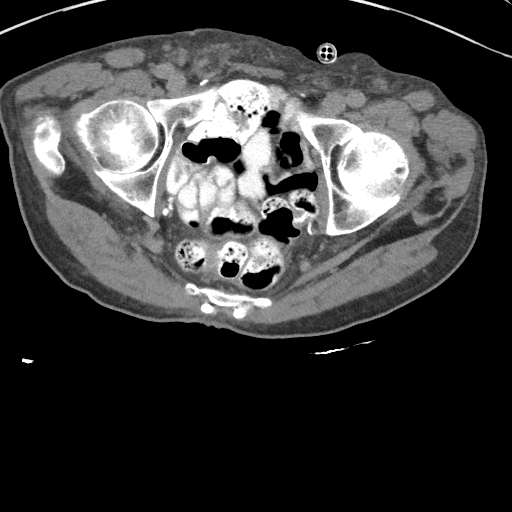
[im 30/90  soft-tissue]
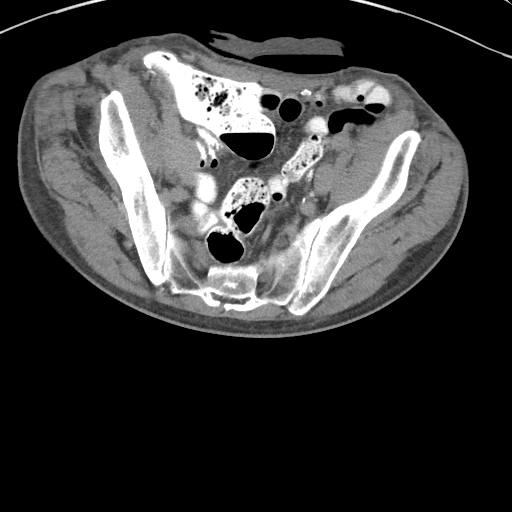
[im 40/90  soft-tissue]
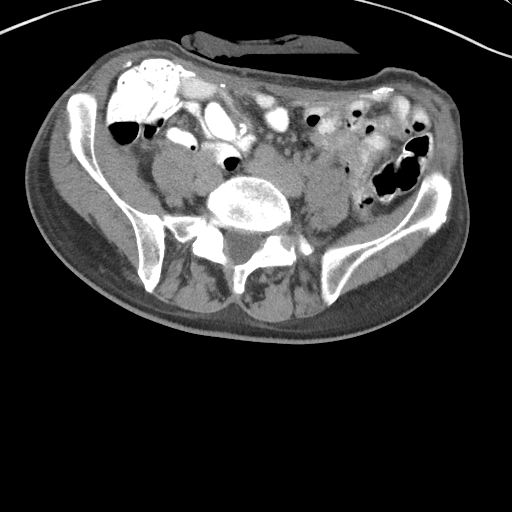
[im 50/90  soft-tissue]
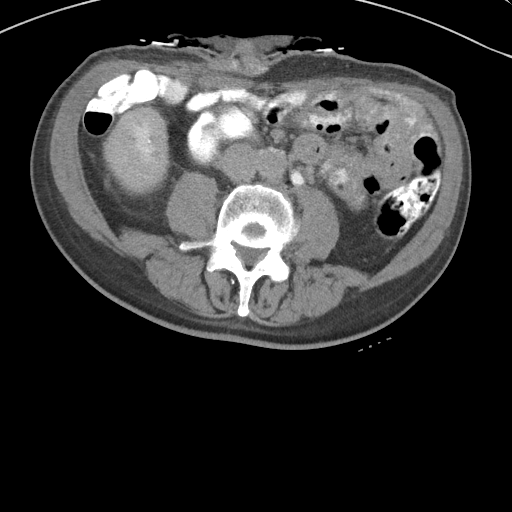
[im 60/90  soft-tissue]
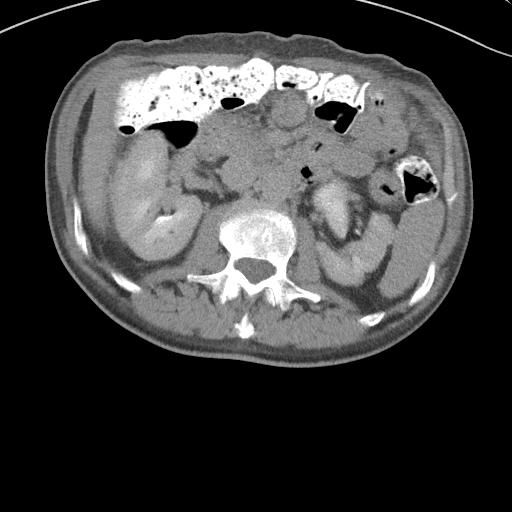
[im 70/90  soft-tissue]
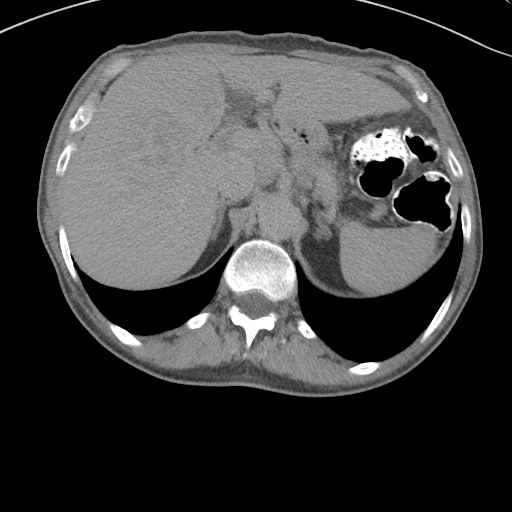
[im 80/90  soft-tissue]
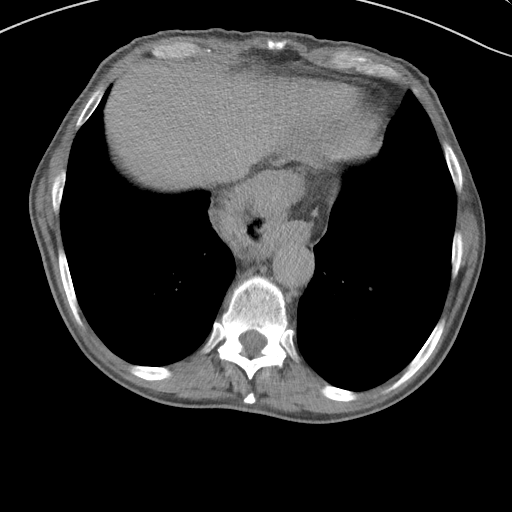

[14 of 46 positions shown; findings below may reference images not displayed]

FINDINGS: CT CHEST FINDINGS

Cardiovascular: The heart size is normal. No substantial pericardial
effusion. Atherosclerotic calcification is noted in the wall of the
thoracic aorta. Right Port-A-Cath tip is positioned in the right
atrium.

Mediastinum/Nodes: No mediastinal lymphadenopathy. There is no hilar
lymphadenopathy. Moderate to large hiatal hernia. The esophagus has
normal imaging features. There is no axillary lymphadenopathy.

Lungs/Pleura: 2-3 mm left lower lobe nodule on 113/5 is new in the
interval. No suspicious pulmonary nodule or mass. No focal airspace
consolidation. No pleural effusion.

Musculoskeletal: Sclerotic lesion in the left clavicle. Compression
fractures at T3 and T9 with associated mixed lytic and sclerotic
lesion at T7. Focal sclerotic lesion noted right T5 pedicle.
Bilateral nonacute rib fractures evident.

CT ABDOMEN PELVIS FINDINGS

Hepatobiliary: Index lesion measured in the caudate lobe previously
at 4.8 cm now measures 3.0 cm.

Posterior left hepatic lesion measured at 4.6 cm previously now
measures 1.1 cm.

Dominant lesion in the posterior right hepatic dome measured
previously at 4.1 cm is now 3.7 cm.

Inferior right hepatic lobe lesion adjacent to the gallbladder fossa
measures 4.1 cm today which compares to 5.2 cm when I remeasure in a
similar fashion on the previous study.

A 5.6 cm inferior right hepatic lesion on image 18/series 2 has
progressed from 3.8 cm when I remeasure it in a similar fashion on
the prior study. Adjacent satellite lesions visible on image [DATE]
have also progressed.

There is no evidence for gallstones, gallbladder wall thickening, or
pericholecystic fluid. No intrahepatic or extrahepatic biliary
dilation.

Pancreas: Heterogeneous attenuation is identified in the anterior
pancreatic head on image [DATE], indeterminate. No dilatation of the
main duct. No intraparenchymal cyst. No peripancreatic edema.

Spleen: Small subcapsular hypodensity in the dome of the spleen is
stable.

Adrenals/Urinary Tract: Stable thickening left adrenal gland. Right
adrenal gland unremarkable. Tiny hypoattenuating lesion in the
interpolar right kidney is likely a cyst. Left kidney atrophic.
Minimal bilateral hydroureteronephrosis similar to prior. No
dilatation of the ileal conduit.

Stomach/Bowel: Moderate to large hiatal hernia. Stomach otherwise
decompressed. Duodenum is normally positioned as is the ligament of
Treitz. No small bowel wall thickening. No small bowel dilatation.
No gross colonic mass. No colonic wall thickening.

Vascular/Lymphatic: No abdominal aortic aneurysm. No abdominal
aortic atherosclerotic calcification. 11 mm short axis gastrohepatic
ligament lymph node on [DATE] is stable. 16 mm hepatoduodenal ligament
lymph node on [DATE] is similar to prior. No retroperitoneal
lymphadenopathy. No pelvic sidewall lymphadenopathy.

Reproductive: Prostate gland surgically absent.

Other: No substantial intraperitoneal free fluid.

Musculoskeletal: Mixed lytic and sclerotic lesion in the posterior
right iliac bone is more prominent than before.

Lytic lesion measured previously in the posterior L5 vertebral body
has progressed in the interval measuring 2.3 x 1.8 cm today compared
to 1.8 x 1.3 cm previously.

The left L3 lesion measures 3.0 x 2.1 cm today compared to 2.5 x
cm previously.

There is a new 12 mm lytic lesion posterior left iliac bone on 49/2.

2.0 x 1.6 cm posterior left ischial tuberosity lesion was 1.7 x
cm previously.
IMPRESSION: 1. Apparent interval mixed response to therapy.
2. Multifocal liver metastases are variably increased in size or
decreased in size in the interval.
3. Numerous bone metastases in the thoracolumbar spine and bony
pelvis. Some of these appear more sclerotic today suggesting
interval healing, but others have increased in size and a new lesion
is identified in the posterior left iliac bone.
4. Stable mild lymphadenopathy in the upper abdomen.
5. Stable minimal bilateral hydroureteronephrosis with ileal
conduit.
6. Moderate to large hiatal hernia.
7. 2-3 mm left lower lobe pulmonary nodule, new in the interval.
Attention on follow-up recommended.
8. Aortic Atherosclerosis ([id number].[email or web address]).

## 2022-02-20 IMAGING — CT CT CHEST W/ CM
2 of 3 series · 13 of 36 positions shown, 16 images · IV contrast (OMNIPAQUE)
Comparison: Abdomen/pelvis CT [HOSPITAL]
07/28/2019. Abdomen/pelvis CT 12/19/2016

CLINICAL DATA: Bladder cancer. Status post cysto prostatectomy with
urinary diversion.

EXAM:
CT CHEST, ABDOMEN, AND PELVIS WITH CONTRAST
TECHNIQUE: Multidetector CT imaging of the chest, abdomen and pelvis was
performed following the standard protocol during bolus
administration of intravenous contrast.
CONTRAST:  100mL OMNIPAQUE IOHEXOL 300 MG/ML  SOLN

[Series 2: axial st · axial · 0.74mm/px · z∈[-336,-68]mm · 10 of 158 slices shown, 13 images]
[im 12/158  mediastinal]
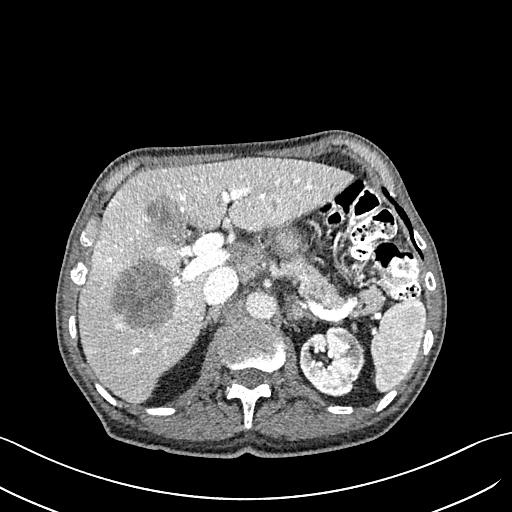
[im 12/158  lung]
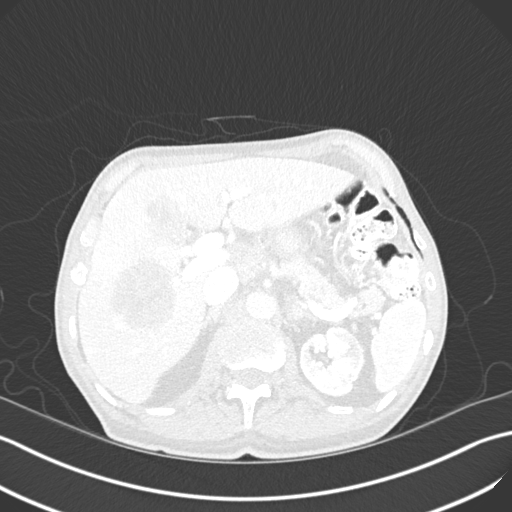
[im 24/158  lung]
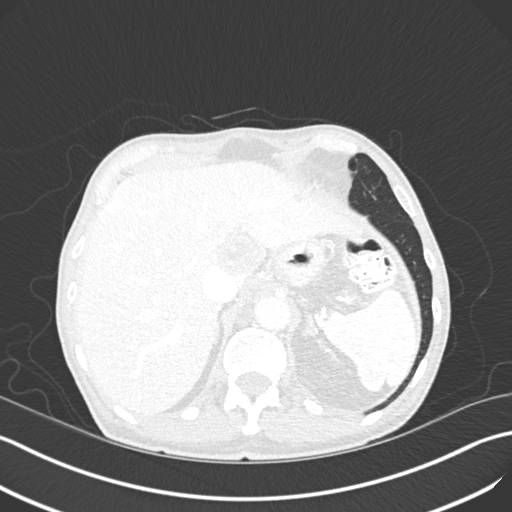
[im 41/158  lung]
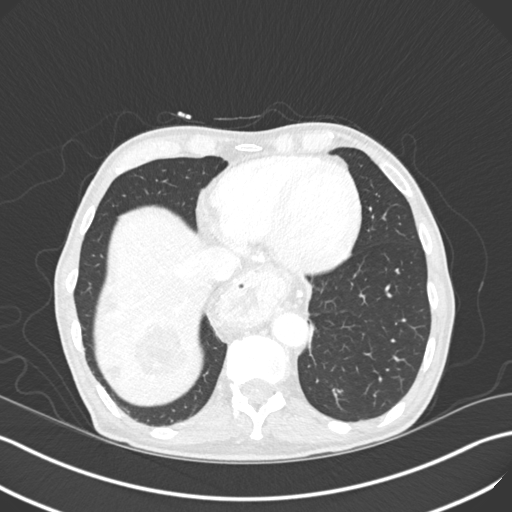
[im 59/158  lung]
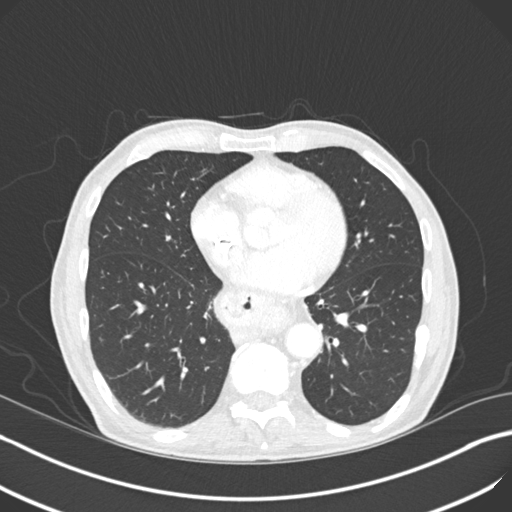
[im 70/158  mediastinal]
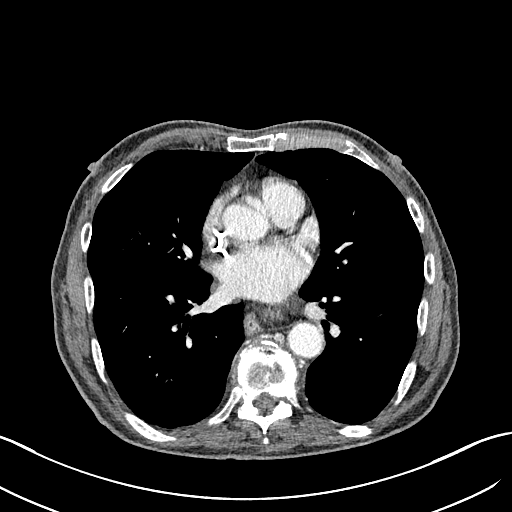
[im 70/158  lung]
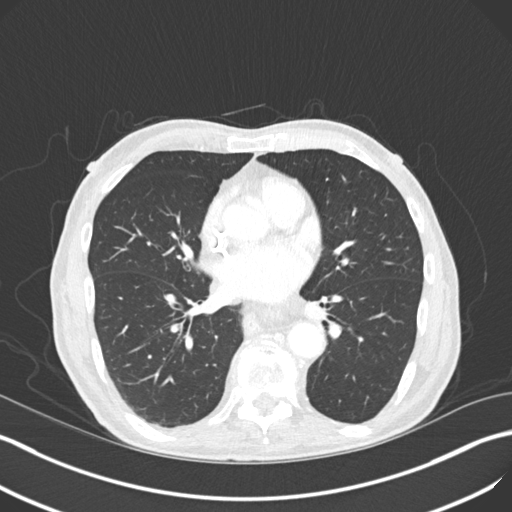
[im 88/158  lung]
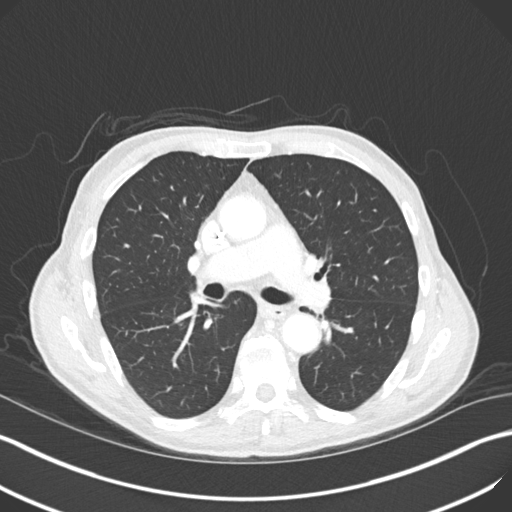
[im 99/158  lung]
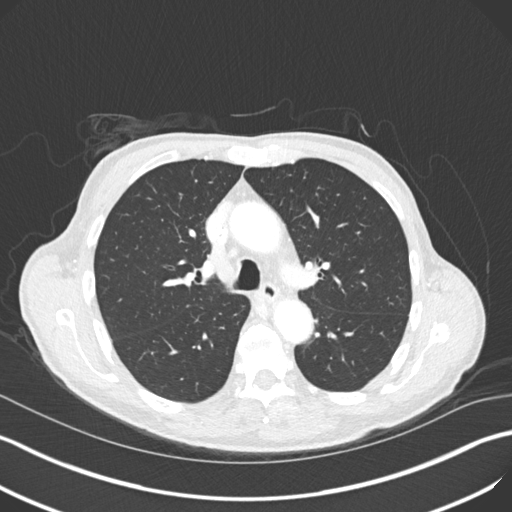
[im 117/158  lung]
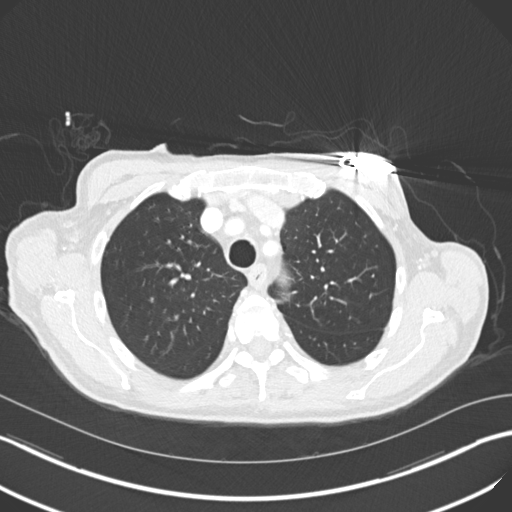
[im 134/158  mediastinal]
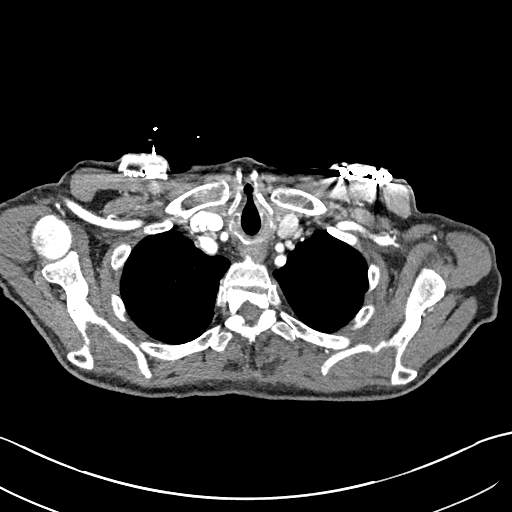
[im 134/158  lung]
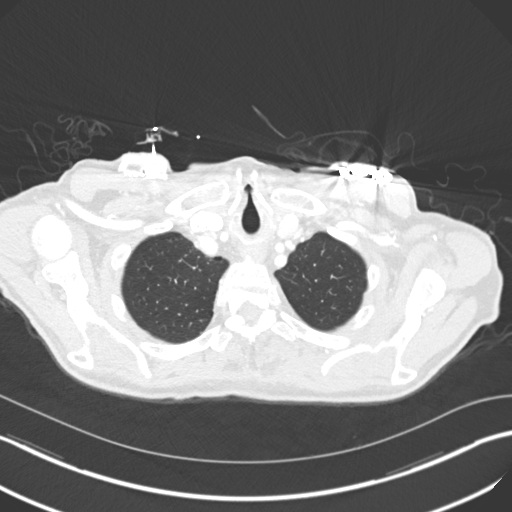
[im 146/158  lung]
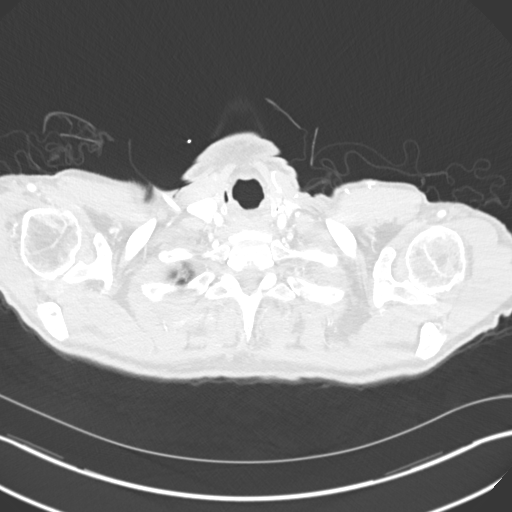

[Series 6: coronal · coronal · 0.64mm/px · 3 of 126 slices shown]
[im 26/126  lung]
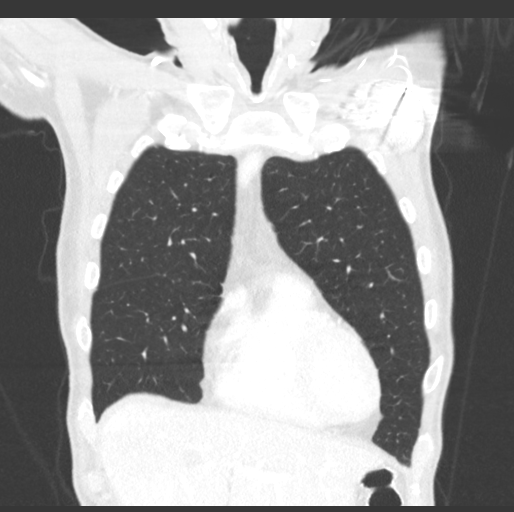
[im 51/126  lung]
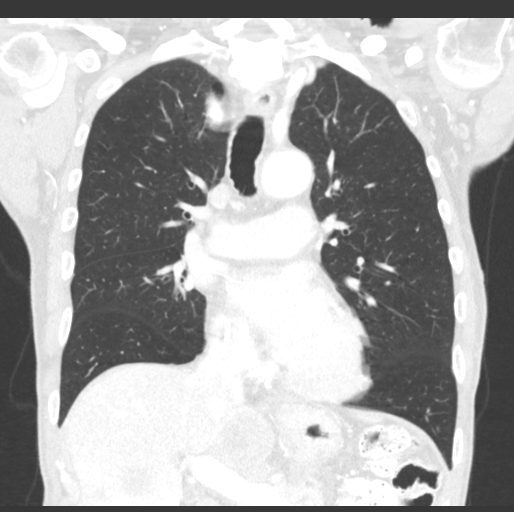
[im 76/126  lung]
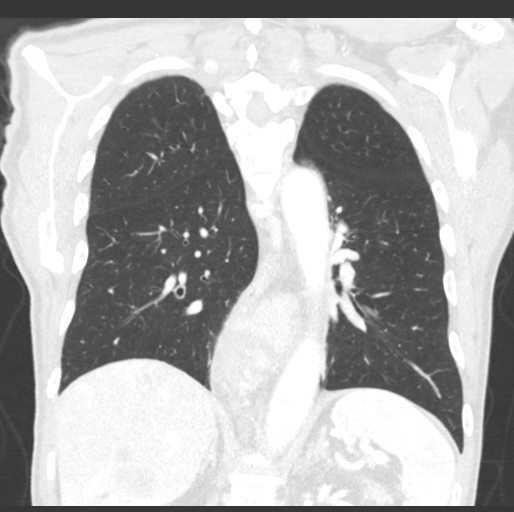

[13 of 36 positions shown; findings below may reference images not displayed]

FINDINGS: CT CHEST FINDINGS

Cardiovascular: The heart size is normal. No substantial pericardial
effusion. Atherosclerotic calcification is noted in the wall of the
thoracic aorta. Right Port-A-Cath tip is positioned in the right
atrium.

Mediastinum/Nodes: No mediastinal lymphadenopathy. There is no hilar
lymphadenopathy. Moderate to large hiatal hernia. The esophagus has
normal imaging features. There is no axillary lymphadenopathy.

Lungs/Pleura: 2-3 mm left lower lobe nodule on 113/5 is new in the
interval. No suspicious pulmonary nodule or mass. No focal airspace
consolidation. No pleural effusion.

Musculoskeletal: Sclerotic lesion in the left clavicle. Compression
fractures at T3 and T9 with associated mixed lytic and sclerotic
lesion at T7. Focal sclerotic lesion noted right T5 pedicle.
Bilateral nonacute rib fractures evident.

CT ABDOMEN PELVIS FINDINGS

Hepatobiliary: Index lesion measured in the caudate lobe previously
at 4.8 cm now measures 3.0 cm.

Posterior left hepatic lesion measured at 4.6 cm previously now
measures 1.1 cm.

Dominant lesion in the posterior right hepatic dome measured
previously at 4.1 cm is now 3.7 cm.

Inferior right hepatic lobe lesion adjacent to the gallbladder fossa
measures 4.1 cm today which compares to 5.2 cm when I remeasure in a
similar fashion on the previous study.

A 5.6 cm inferior right hepatic lesion on image 18/series 2 has
progressed from 3.8 cm when I remeasure it in a similar fashion on
the prior study. Adjacent satellite lesions visible on image [DATE]
have also progressed.

There is no evidence for gallstones, gallbladder wall thickening, or
pericholecystic fluid. No intrahepatic or extrahepatic biliary
dilation.

Pancreas: Heterogeneous attenuation is identified in the anterior
pancreatic head on image [DATE], indeterminate. No dilatation of the
main duct. No intraparenchymal cyst. No peripancreatic edema.

Spleen: Small subcapsular hypodensity in the dome of the spleen is
stable.

Adrenals/Urinary Tract: Stable thickening left adrenal gland. Right
adrenal gland unremarkable. Tiny hypoattenuating lesion in the
interpolar right kidney is likely a cyst. Left kidney atrophic.
Minimal bilateral hydroureteronephrosis similar to prior. No
dilatation of the ileal conduit.

Stomach/Bowel: Moderate to large hiatal hernia. Stomach otherwise
decompressed. Duodenum is normally positioned as is the ligament of
Treitz. No small bowel wall thickening. No small bowel dilatation.
No gross colonic mass. No colonic wall thickening.

Vascular/Lymphatic: No abdominal aortic aneurysm. No abdominal
aortic atherosclerotic calcification. 11 mm short axis gastrohepatic
ligament lymph node on [DATE] is stable. 16 mm hepatoduodenal ligament
lymph node on [DATE] is similar to prior. No retroperitoneal
lymphadenopathy. No pelvic sidewall lymphadenopathy.

Reproductive: Prostate gland surgically absent.

Other: No substantial intraperitoneal free fluid.

Musculoskeletal: Mixed lytic and sclerotic lesion in the posterior
right iliac bone is more prominent than before.

Lytic lesion measured previously in the posterior L5 vertebral body
has progressed in the interval measuring 2.3 x 1.8 cm today compared
to 1.8 x 1.3 cm previously.

The left L3 lesion measures 3.0 x 2.1 cm today compared to 2.5 x
cm previously.

There is a new 12 mm lytic lesion posterior left iliac bone on 49/2.

2.0 x 1.6 cm posterior left ischial tuberosity lesion was 1.7 x
cm previously.
IMPRESSION: 1. Apparent interval mixed response to therapy.
2. Multifocal liver metastases are variably increased in size or
decreased in size in the interval.
3. Numerous bone metastases in the thoracolumbar spine and bony
pelvis. Some of these appear more sclerotic today suggesting
interval healing, but others have increased in size and a new lesion
is identified in the posterior left iliac bone.
4. Stable mild lymphadenopathy in the upper abdomen.
5. Stable minimal bilateral hydroureteronephrosis with ileal
conduit.
6. Moderate to large hiatal hernia.
7. 2-3 mm left lower lobe pulmonary nodule, new in the interval.
Attention on follow-up recommended.
8. Aortic Atherosclerosis ([id number].[id number]).

## 2023-02-10 NOTE — Telephone Encounter (Signed)
TC
